# Patient Record
Sex: Female | Born: 1935
Health system: Southern US, Community
[De-identification: ages and names within clinical notes are randomized; demographics above are authoritative.]

## PROBLEM LIST (undated history)

## (undated) DIAGNOSIS — R569 Unspecified convulsions: Secondary | ICD-10-CM

## (undated) DIAGNOSIS — E785 Hyperlipidemia, unspecified: Secondary | ICD-10-CM

## (undated) DIAGNOSIS — I5189 Other ill-defined heart diseases: Secondary | ICD-10-CM

## (undated) DIAGNOSIS — T7840XA Allergy, unspecified, initial encounter: Secondary | ICD-10-CM

## (undated) DIAGNOSIS — I10 Essential (primary) hypertension: Secondary | ICD-10-CM

## (undated) DIAGNOSIS — F419 Anxiety disorder, unspecified: Secondary | ICD-10-CM

## (undated) DIAGNOSIS — F32A Depression, unspecified: Secondary | ICD-10-CM

## (undated) DIAGNOSIS — F329 Major depressive disorder, single episode, unspecified: Secondary | ICD-10-CM

## (undated) HISTORY — DX: Essential (primary) hypertension: I10

## (undated) HISTORY — DX: Unspecified convulsions: R56.9

## (undated) HISTORY — DX: Hyperlipidemia, unspecified: E78.5

## (undated) HISTORY — DX: Allergy, unspecified, initial encounter: T78.40XA

## (undated) HISTORY — DX: Major depressive disorder, single episode, unspecified: F32.9

## (undated) HISTORY — DX: Other ill-defined heart diseases: I51.89

## (undated) HISTORY — DX: Anxiety disorder, unspecified: F41.9

## (undated) HISTORY — DX: Depression, unspecified: F32.A

---

## 1985-09-14 HISTORY — PX: DILATION AND CURETTAGE OF UTERUS: SHX78

## 1999-01-28 ENCOUNTER — Other Ambulatory Visit: Admission: RE | Admit: 1999-01-28 | Discharge: 1999-01-28 | Payer: Self-pay | Admitting: Internal Medicine

## 2003-06-29 ENCOUNTER — Emergency Department (HOSPITAL_COMMUNITY): Admission: EM | Admit: 2003-06-29 | Discharge: 2003-06-29 | Payer: Self-pay | Admitting: Emergency Medicine

## 2004-05-27 ENCOUNTER — Other Ambulatory Visit: Admission: RE | Admit: 2004-05-27 | Discharge: 2004-05-27 | Payer: Self-pay | Admitting: Obstetrics and Gynecology

## 2004-05-30 ENCOUNTER — Encounter: Admission: RE | Admit: 2004-05-30 | Discharge: 2004-05-30 | Payer: Self-pay | Admitting: Internal Medicine

## 2004-08-21 ENCOUNTER — Ambulatory Visit: Payer: Self-pay | Admitting: Internal Medicine

## 2005-05-28 ENCOUNTER — Ambulatory Visit: Payer: Self-pay | Admitting: Internal Medicine

## 2005-08-12 ENCOUNTER — Ambulatory Visit: Payer: Self-pay | Admitting: Internal Medicine

## 2005-11-25 ENCOUNTER — Ambulatory Visit: Payer: Self-pay | Admitting: Internal Medicine

## 2005-12-24 ENCOUNTER — Ambulatory Visit: Payer: Self-pay

## 2006-05-07 ENCOUNTER — Ambulatory Visit: Payer: Self-pay | Admitting: Internal Medicine

## 2006-05-21 ENCOUNTER — Encounter (INDEPENDENT_AMBULATORY_CARE_PROVIDER_SITE_OTHER): Payer: Self-pay | Admitting: *Deleted

## 2006-05-21 ENCOUNTER — Ambulatory Visit: Payer: Self-pay | Admitting: Cardiology

## 2006-06-11 ENCOUNTER — Ambulatory Visit: Payer: Self-pay | Admitting: Internal Medicine

## 2006-07-16 ENCOUNTER — Ambulatory Visit: Payer: Self-pay | Admitting: Internal Medicine

## 2006-09-17 ENCOUNTER — Ambulatory Visit: Payer: Self-pay | Admitting: Internal Medicine

## 2006-12-08 ENCOUNTER — Ambulatory Visit: Payer: Self-pay | Admitting: Internal Medicine

## 2006-12-08 LAB — CONVERTED CEMR LAB
ALT: 28 units/L (ref 0–40)
AST: 36 units/L (ref 0–37)
Albumin: 4 g/dL (ref 3.5–5.2)
Alkaline Phosphatase: 43 units/L (ref 39–117)
BUN: 12 mg/dL (ref 6–23)
Bilirubin, Direct: 0.1 mg/dL (ref 0.0–0.3)
CO2: 30 meq/L (ref 19–32)
Calcium: 9.5 mg/dL (ref 8.4–10.5)
Chloride: 102 meq/L (ref 96–112)
Cholesterol: 204 mg/dL (ref 0–200)
Creatinine, Ser: 0.8 mg/dL (ref 0.4–1.2)
Direct LDL: 128.9 mg/dL
GFR calc Af Amer: 90 mL/min
GFR calc non Af Amer: 75 mL/min
Glucose, Bld: 134 mg/dL — ABNORMAL HIGH (ref 70–99)
HDL: 58.4 mg/dL (ref >39.0)
Hgb A1c MFr Bld: 6.4 % — ABNORMAL HIGH (ref 4.6–6.0)
Potassium: 4.4 meq/L (ref 3.5–5.1)
Sed Rate: 10 mm/hr (ref 0–25)
Sodium: 140 meq/L (ref 135–145)
TSH: 2.87 microintl units/mL (ref 0.35–5.50)
Total Bilirubin: 0.9 mg/dL (ref 0.3–1.2)
Total CHOL/HDL Ratio: 3.5
Total Protein: 6.8 g/dL (ref 6.0–8.3)
Triglycerides: 89 mg/dL (ref 0–149)
VLDL: 18 mg/dL (ref 0–40)
Vit D, 1,25-Dihydroxy: 41 (ref 20–57)
Vitamin B-12: 601 pg/mL (ref 211–911)

## 2006-12-24 ENCOUNTER — Ambulatory Visit: Payer: Self-pay | Admitting: Internal Medicine

## 2007-03-15 ENCOUNTER — Ambulatory Visit: Payer: Self-pay | Admitting: Internal Medicine

## 2007-03-31 ENCOUNTER — Ambulatory Visit: Payer: Self-pay | Admitting: Internal Medicine

## 2007-03-31 LAB — CONVERTED CEMR LAB
BUN: 20 mg/dL (ref 6–23)
CO2: 30 meq/L (ref 19–32)
Calcium: 9.5 mg/dL (ref 8.4–10.5)
Chloride: 102 meq/L (ref 96–112)
Creatinine, Ser: 0.9 mg/dL (ref 0.4–1.2)
GFR calc Af Amer: 79 mL/min
GFR calc non Af Amer: 65 mL/min
Glucose, Bld: 100 mg/dL — ABNORMAL HIGH (ref 70–99)
Hgb A1c MFr Bld: 6.3 % — ABNORMAL HIGH (ref 4.6–6.0)
Potassium: 4.9 meq/L (ref 3.5–5.1)
Sodium: 138 meq/L (ref 135–145)

## 2007-04-01 ENCOUNTER — Ambulatory Visit: Payer: Self-pay | Admitting: Internal Medicine

## 2007-06-13 ENCOUNTER — Ambulatory Visit: Payer: Self-pay | Admitting: Internal Medicine

## 2007-06-29 ENCOUNTER — Encounter: Payer: Self-pay | Admitting: Internal Medicine

## 2007-06-29 DIAGNOSIS — F419 Anxiety disorder, unspecified: Secondary | ICD-10-CM | POA: Insufficient documentation

## 2007-06-29 DIAGNOSIS — J309 Allergic rhinitis, unspecified: Secondary | ICD-10-CM | POA: Insufficient documentation

## 2007-06-29 DIAGNOSIS — I129 Hypertensive chronic kidney disease with stage 1 through stage 4 chronic kidney disease, or unspecified chronic kidney disease: Secondary | ICD-10-CM | POA: Insufficient documentation

## 2007-06-29 DIAGNOSIS — M810 Age-related osteoporosis without current pathological fracture: Secondary | ICD-10-CM | POA: Insufficient documentation

## 2007-06-29 DIAGNOSIS — F411 Generalized anxiety disorder: Secondary | ICD-10-CM | POA: Insufficient documentation

## 2007-06-29 DIAGNOSIS — F32A Depression, unspecified: Secondary | ICD-10-CM | POA: Insufficient documentation

## 2007-06-29 DIAGNOSIS — F329 Major depressive disorder, single episode, unspecified: Secondary | ICD-10-CM | POA: Insufficient documentation

## 2007-08-01 ENCOUNTER — Ambulatory Visit: Payer: Self-pay | Admitting: Internal Medicine

## 2007-08-01 DIAGNOSIS — E119 Type 2 diabetes mellitus without complications: Secondary | ICD-10-CM | POA: Insufficient documentation

## 2007-08-01 LAB — CONVERTED CEMR LAB
BUN: 12 mg/dL (ref 6–23)
CO2: 28 meq/L (ref 19–32)
Calcium: 9.3 mg/dL (ref 8.4–10.5)
Chloride: 103 meq/L (ref 96–112)
Creatinine, Ser: 0.8 mg/dL (ref 0.4–1.2)
GFR calc Af Amer: 90 mL/min
GFR calc non Af Amer: 75 mL/min
Glucose, Bld: 114 mg/dL — ABNORMAL HIGH (ref 70–99)
Hgb A1c MFr Bld: 6.3 % — ABNORMAL HIGH (ref 4.6–6.0)
Potassium: 4.7 meq/L (ref 3.5–5.1)
Sodium: 139 meq/L (ref 135–145)

## 2007-09-05 ENCOUNTER — Ambulatory Visit: Payer: Self-pay | Admitting: Internal Medicine

## 2007-09-05 DIAGNOSIS — L570 Actinic keratosis: Secondary | ICD-10-CM | POA: Insufficient documentation

## 2007-10-03 ENCOUNTER — Telehealth: Payer: Self-pay | Admitting: Internal Medicine

## 2007-11-07 ENCOUNTER — Ambulatory Visit: Payer: Self-pay | Admitting: Internal Medicine

## 2007-11-07 LAB — CONVERTED CEMR LAB
ALT: 32 units/L (ref 0–35)
AST: 36 units/L (ref 0–37)
Albumin: 3.9 g/dL (ref 3.5–5.2)
Alkaline Phosphatase: 46 units/L (ref 39–117)
BUN: 14 mg/dL (ref 6–23)
Bilirubin, Direct: 0.2 mg/dL (ref 0.0–0.3)
CO2: 30 meq/L (ref 19–32)
Calcium: 9.4 mg/dL (ref 8.4–10.5)
Chloride: 103 meq/L (ref 96–112)
Cholesterol: 204 mg/dL (ref 0–200)
Creatinine, Ser: 0.9 mg/dL (ref 0.4–1.2)
Direct LDL: 115.1 mg/dL
GFR calc Af Amer: 79 mL/min
GFR calc non Af Amer: 65 mL/min
Glucose, Bld: 107 mg/dL — ABNORMAL HIGH (ref 70–99)
HDL: 59.3 mg/dL (ref >39.0)
Hgb A1c MFr Bld: 6.2 % — ABNORMAL HIGH (ref 4.6–6.0)
Potassium: 4.8 meq/L (ref 3.5–5.1)
Sodium: 139 meq/L (ref 135–145)
Total Bilirubin: 0.7 mg/dL (ref 0.3–1.2)
Total CHOL/HDL Ratio: 3.4
Total Protein: 7.1 g/dL (ref 6.0–8.3)
Triglycerides: 104 mg/dL (ref 0–149)
VLDL: 21 mg/dL (ref 0–40)

## 2007-11-23 ENCOUNTER — Ambulatory Visit: Payer: Self-pay | Admitting: Internal Medicine

## 2007-11-23 DIAGNOSIS — H01009 Unspecified blepharitis unspecified eye, unspecified eyelid: Secondary | ICD-10-CM | POA: Insufficient documentation

## 2007-11-25 ENCOUNTER — Encounter: Payer: Self-pay | Admitting: Internal Medicine

## 2008-01-24 ENCOUNTER — Encounter: Payer: Self-pay | Admitting: Endocrinology

## 2008-04-19 ENCOUNTER — Ambulatory Visit: Payer: Self-pay | Admitting: Internal Medicine

## 2008-04-19 LAB — CONVERTED CEMR LAB
ALT: 20 units/L (ref 0–35)
AST: 31 units/L (ref 0–37)
Albumin: 3.9 g/dL (ref 3.5–5.2)
Alkaline Phosphatase: 42 units/L (ref 39–117)
Bilirubin, Direct: 0.1 mg/dL (ref 0.0–0.3)
Cholesterol: 190 mg/dL (ref 0–200)
HDL: 54.2 mg/dL (ref >39.0)
Hgb A1c MFr Bld: 6.3 % — ABNORMAL HIGH (ref 4.6–6.0)
LDL Cholesterol: 123 mg/dL — ABNORMAL HIGH (ref 0–99)
TSH: 2.38 microintl units/mL (ref 0.35–5.50)
Total Bilirubin: 0.8 mg/dL (ref 0.3–1.2)
Total CHOL/HDL Ratio: 3.5
Total Protein: 6.7 g/dL (ref 6.0–8.3)
Triglycerides: 64 mg/dL (ref 0–149)
VLDL: 13 mg/dL (ref 0–40)

## 2008-04-20 ENCOUNTER — Ambulatory Visit: Payer: Self-pay | Admitting: Internal Medicine

## 2008-04-20 ENCOUNTER — Encounter (INDEPENDENT_AMBULATORY_CARE_PROVIDER_SITE_OTHER): Payer: Self-pay | Admitting: *Deleted

## 2008-04-20 DIAGNOSIS — K5900 Constipation, unspecified: Secondary | ICD-10-CM | POA: Insufficient documentation

## 2008-05-15 ENCOUNTER — Telehealth: Payer: Self-pay | Admitting: Internal Medicine

## 2008-05-28 ENCOUNTER — Ambulatory Visit: Payer: Self-pay | Admitting: Internal Medicine

## 2008-06-21 ENCOUNTER — Ambulatory Visit: Payer: Self-pay | Admitting: Internal Medicine

## 2008-06-22 ENCOUNTER — Telehealth: Payer: Self-pay | Admitting: Internal Medicine

## 2008-06-28 ENCOUNTER — Ambulatory Visit: Payer: Self-pay | Admitting: Internal Medicine

## 2008-06-28 ENCOUNTER — Telehealth: Payer: Self-pay | Admitting: Internal Medicine

## 2008-06-28 DIAGNOSIS — E785 Hyperlipidemia, unspecified: Secondary | ICD-10-CM | POA: Insufficient documentation

## 2008-07-24 ENCOUNTER — Encounter: Payer: Self-pay | Admitting: Internal Medicine

## 2008-08-22 ENCOUNTER — Ambulatory Visit: Payer: Self-pay | Admitting: Internal Medicine

## 2008-08-22 LAB — CONVERTED CEMR LAB
ALT: 23 units/L (ref 0–35)
AST: 31 units/L (ref 0–37)
Albumin: 3.9 g/dL (ref 3.5–5.2)
Alkaline Phosphatase: 54 units/L (ref 39–117)
BUN: 15 mg/dL (ref 6–23)
Bilirubin, Direct: 0.1 mg/dL (ref 0.0–0.3)
CO2: 30 meq/L (ref 19–32)
Calcium: 9.3 mg/dL (ref 8.4–10.5)
Chloride: 104 meq/L (ref 96–112)
Creatinine, Ser: 0.8 mg/dL (ref 0.4–1.2)
GFR calc Af Amer: 90 mL/min
GFR calc non Af Amer: 74 mL/min
Glucose, Bld: 102 mg/dL — ABNORMAL HIGH (ref 70–99)
Hgb A1c MFr Bld: 6.2 % — ABNORMAL HIGH (ref 4.6–6.0)
Potassium: 4.7 meq/L (ref 3.5–5.1)
Sodium: 139 meq/L (ref 135–145)
Total Bilirubin: 0.7 mg/dL (ref 0.3–1.2)
Total Protein: 7 g/dL (ref 6.0–8.3)

## 2008-08-23 ENCOUNTER — Ambulatory Visit: Payer: Self-pay | Admitting: Internal Medicine

## 2008-09-28 ENCOUNTER — Ambulatory Visit: Payer: Self-pay | Admitting: Internal Medicine

## 2008-10-29 ENCOUNTER — Ambulatory Visit: Payer: Self-pay | Admitting: Internal Medicine

## 2008-10-30 LAB — CONVERTED CEMR LAB
BUN: 22 mg/dL (ref 6–23)
CO2: 30 meq/L (ref 19–32)
Calcium: 9.7 mg/dL (ref 8.4–10.5)
Chloride: 104 meq/L (ref 96–112)
Creatinine, Ser: 1 mg/dL (ref 0.4–1.2)
GFR calc Af Amer: 70 mL/min
GFR calc non Af Amer: 57 mL/min
Glucose, Bld: 112 mg/dL — ABNORMAL HIGH (ref 70–99)
Potassium: 4.8 meq/L (ref 3.5–5.1)
Sodium: 140 meq/L (ref 135–145)

## 2008-10-31 ENCOUNTER — Ambulatory Visit: Payer: Self-pay | Admitting: Internal Medicine

## 2008-10-31 DIAGNOSIS — L259 Unspecified contact dermatitis, unspecified cause: Secondary | ICD-10-CM | POA: Insufficient documentation

## 2008-12-24 ENCOUNTER — Encounter: Payer: Self-pay | Admitting: Internal Medicine

## 2009-01-01 ENCOUNTER — Telehealth: Payer: Self-pay | Admitting: Internal Medicine

## 2009-03-12 ENCOUNTER — Ambulatory Visit: Payer: Self-pay | Admitting: Internal Medicine

## 2009-03-12 LAB — CONVERTED CEMR LAB
BUN: 27 mg/dL — ABNORMAL HIGH (ref 6–23)
CO2: 30 meq/L (ref 19–32)
Calcium: 9.2 mg/dL (ref 8.4–10.5)
Chloride: 98 meq/L (ref 96–112)
Cholesterol: 191 mg/dL (ref 0–200)
Creatinine, Ser: 1 mg/dL (ref 0.4–1.2)
GFR calc non Af Amer: 57.28 mL/min (ref >60)
Glucose, Bld: 99 mg/dL (ref 70–99)
HDL: 59.4 mg/dL (ref >39.00)
Hgb A1c MFr Bld: 6.4 % (ref 4.6–6.5)
LDL Cholesterol: 120 mg/dL — ABNORMAL HIGH (ref 0–99)
Potassium: 4.2 meq/L (ref 3.5–5.1)
Sodium: 137 meq/L (ref 135–145)
Total CHOL/HDL Ratio: 3
Triglycerides: 60 mg/dL (ref 0.0–149.0)
VLDL: 12 mg/dL (ref 0.0–40.0)

## 2009-03-14 ENCOUNTER — Ambulatory Visit: Payer: Self-pay | Admitting: Internal Medicine

## 2009-07-10 ENCOUNTER — Ambulatory Visit: Payer: Self-pay | Admitting: Internal Medicine

## 2009-07-10 LAB — CONVERTED CEMR LAB
BUN: 22 mg/dL (ref 6–23)
CO2: 32 meq/L (ref 19–32)
Calcium: 9.4 mg/dL (ref 8.4–10.5)
Chloride: 100 meq/L (ref 96–112)
Creatinine, Ser: 1 mg/dL (ref 0.4–1.2)
GFR calc non Af Amer: 57.23 mL/min (ref >60)
Glucose, Bld: 98 mg/dL (ref 70–99)
Hgb A1c MFr Bld: 6.2 % (ref 4.6–6.5)
Potassium: 4.4 meq/L (ref 3.5–5.1)
Sodium: 140 meq/L (ref 135–145)

## 2009-07-12 ENCOUNTER — Ambulatory Visit: Payer: Self-pay | Admitting: Internal Medicine

## 2009-07-12 DIAGNOSIS — R142 Eructation: Secondary | ICD-10-CM

## 2009-07-12 DIAGNOSIS — R141 Gas pain: Secondary | ICD-10-CM | POA: Insufficient documentation

## 2009-07-12 DIAGNOSIS — R143 Flatulence: Secondary | ICD-10-CM

## 2009-07-12 DIAGNOSIS — Z87891 Personal history of nicotine dependence: Secondary | ICD-10-CM | POA: Insufficient documentation

## 2009-11-25 ENCOUNTER — Encounter: Payer: Self-pay | Admitting: Internal Medicine

## 2010-01-27 ENCOUNTER — Ambulatory Visit: Payer: Self-pay | Admitting: Internal Medicine

## 2010-01-27 LAB — CONVERTED CEMR LAB
ALT: 17 units/L (ref 0–35)
AST: 28 units/L (ref 0–37)
Albumin: 4.1 g/dL (ref 3.5–5.2)
Alkaline Phosphatase: 46 units/L (ref 39–117)
BUN: 17 mg/dL (ref 6–23)
Bilirubin Urine: NEGATIVE
Bilirubin, Direct: 0.1 mg/dL (ref 0.0–0.3)
CO2: 30 meq/L (ref 19–32)
Calcium: 9.6 mg/dL (ref 8.4–10.5)
Chloride: 93 meq/L — ABNORMAL LOW (ref 96–112)
Cholesterol: 206 mg/dL — ABNORMAL HIGH (ref 0–200)
Creatinine, Ser: 0.9 mg/dL (ref 0.4–1.2)
Direct LDL: 129 mg/dL
GFR calc non Af Amer: 65.37 mL/min (ref >60)
Glucose, Bld: 90 mg/dL (ref 70–99)
HDL: 63.8 mg/dL (ref >39.00)
Hemoglobin, Urine: NEGATIVE
Ketones, ur: NEGATIVE mg/dL
Nitrite: NEGATIVE
Potassium: 4.6 meq/L (ref 3.5–5.1)
Sodium: 133 meq/L — ABNORMAL LOW (ref 135–145)
Specific Gravity, Urine: 1.01 (ref 1.000–1.030)
TSH: 2.57 microintl units/mL (ref 0.35–5.50)
Total Bilirubin: 0.5 mg/dL (ref 0.3–1.2)
Total CHOL/HDL Ratio: 3
Total Protein, Urine: NEGATIVE mg/dL
Total Protein: 6.6 g/dL (ref 6.0–8.3)
Triglycerides: 51 mg/dL (ref 0.0–149.0)
Urine Glucose: NEGATIVE mg/dL
Urobilinogen, UA: 0.2 (ref 0.0–1.0)
VLDL: 10.2 mg/dL (ref 0.0–40.0)
pH: 5.5 (ref 5.0–8.0)

## 2010-01-28 ENCOUNTER — Ambulatory Visit: Payer: Self-pay | Admitting: Internal Medicine

## 2010-01-28 DIAGNOSIS — R05 Cough: Secondary | ICD-10-CM | POA: Insufficient documentation

## 2010-01-28 DIAGNOSIS — N329 Bladder disorder, unspecified: Secondary | ICD-10-CM | POA: Insufficient documentation

## 2010-03-06 ENCOUNTER — Telehealth: Payer: Self-pay | Admitting: Internal Medicine

## 2010-03-31 ENCOUNTER — Telehealth: Payer: Self-pay | Admitting: Internal Medicine

## 2010-04-09 ENCOUNTER — Telehealth: Payer: Self-pay | Admitting: Internal Medicine

## 2010-04-29 ENCOUNTER — Ambulatory Visit: Payer: Self-pay | Admitting: Internal Medicine

## 2010-04-29 LAB — CONVERTED CEMR LAB
ALT: 17 units/L (ref 0–35)
AST: 27 units/L (ref 0–37)
Albumin: 4.1 g/dL (ref 3.5–5.2)
Alkaline Phosphatase: 49 units/L (ref 39–117)
BUN: 26 mg/dL — ABNORMAL HIGH (ref 6–23)
Basophils Absolute: 0.1 10*3/uL (ref 0.0–0.1)
Basophils Relative: 1.6 % (ref 0.0–3.0)
Bilirubin Urine: NEGATIVE
Bilirubin, Direct: 0.1 mg/dL (ref 0.0–0.3)
CO2: 34 meq/L — ABNORMAL HIGH (ref 19–32)
Calcium: 9.2 mg/dL (ref 8.4–10.5)
Chloride: 98 meq/L (ref 96–112)
Cholesterol: 198 mg/dL (ref 0–200)
Creatinine, Ser: 1.1 mg/dL (ref 0.4–1.2)
Eosinophils Absolute: 0.2 10*3/uL (ref 0.0–0.7)
Eosinophils Relative: 3.3 % (ref 0.0–5.0)
GFR calc non Af Amer: 49.09 mL/min (ref >60)
Glucose, Bld: 91 mg/dL (ref 70–99)
HCT: 37.8 % (ref 36.0–46.0)
HDL: 55.9 mg/dL (ref >39.00)
Hemoglobin, Urine: NEGATIVE
Hemoglobin: 12.8 g/dL (ref 12.0–15.0)
Ketones, ur: NEGATIVE mg/dL
LDL Cholesterol: 126 mg/dL — ABNORMAL HIGH (ref 0–99)
Lymphocytes Relative: 41.9 % (ref 12.0–46.0)
Lymphs Abs: 2.4 10*3/uL (ref 0.7–4.0)
MCHC: 34 g/dL (ref 30.0–36.0)
MCV: 84.3 fL (ref 78.0–100.0)
Monocytes Absolute: 0.8 10*3/uL (ref 0.1–1.0)
Monocytes Relative: 13.4 % — ABNORMAL HIGH (ref 3.0–12.0)
Neutro Abs: 2.2 10*3/uL (ref 1.4–7.7)
Neutrophils Relative %: 39.8 % — ABNORMAL LOW (ref 43.0–77.0)
Nitrite: NEGATIVE
Platelets: 322 10*3/uL (ref 150.0–400.0)
Potassium: 5.1 meq/L (ref 3.5–5.1)
RBC: 4.48 M/uL (ref 3.87–5.11)
RDW: 14.8 % — ABNORMAL HIGH (ref 11.5–14.6)
Sodium: 138 meq/L (ref 135–145)
Specific Gravity, Urine: 1.01 (ref 1.000–1.030)
TSH: 3.45 microintl units/mL (ref 0.35–5.50)
Total Bilirubin: 0.5 mg/dL (ref 0.3–1.2)
Total CHOL/HDL Ratio: 4
Total Protein, Urine: NEGATIVE mg/dL
Total Protein: 6.9 g/dL (ref 6.0–8.3)
Triglycerides: 83 mg/dL (ref 0.0–149.0)
Urine Glucose: NEGATIVE mg/dL
Urobilinogen, UA: 0.2 (ref 0.0–1.0)
VLDL: 16.6 mg/dL (ref 0.0–40.0)
WBC: 5.6 10*3/uL (ref 4.5–10.5)
pH: 8 (ref 5.0–8.0)

## 2010-04-30 ENCOUNTER — Ambulatory Visit: Payer: Self-pay | Admitting: Internal Medicine

## 2010-04-30 ENCOUNTER — Encounter: Payer: Self-pay | Admitting: Internal Medicine

## 2010-05-01 ENCOUNTER — Encounter (INDEPENDENT_AMBULATORY_CARE_PROVIDER_SITE_OTHER): Payer: Self-pay | Admitting: *Deleted

## 2010-06-17 ENCOUNTER — Ambulatory Visit: Payer: Self-pay | Admitting: Internal Medicine

## 2010-06-17 DIAGNOSIS — R198 Other specified symptoms and signs involving the digestive system and abdomen: Secondary | ICD-10-CM | POA: Insufficient documentation

## 2010-07-16 ENCOUNTER — Telehealth: Payer: Self-pay | Admitting: Internal Medicine

## 2010-07-22 ENCOUNTER — Ambulatory Visit: Payer: Self-pay | Admitting: Internal Medicine

## 2010-07-22 LAB — HM COLONOSCOPY

## 2010-07-30 ENCOUNTER — Ambulatory Visit: Payer: Self-pay | Admitting: Internal Medicine

## 2010-10-14 NOTE — Letter (Signed)
Summary: New Patient letter  [facility]  [address] [place], [place] [address]   Phone: [phone]  Fax: [phone]       05/01/2010 MRN: [id number]  [facility] [address] [place], [place]  [address]  Dear Ms. Berges,  Welcome to the Gastroenterology Division at [facility].    You are scheduled to see Dr. Yancey Flemings on June 17, 2010 at 10:00am on the 3rd floor at [facility], [address]  We ask that you try to arrive at our office 15 minutes prior to your appointment time to allow for check-in.  We would like you to complete the enclosed self-administered evaluation form prior to your visit and bring it with you on the day of your appointment.  We will review it with you.  Also, please bring a complete list of all your medications or, if you prefer, bring the medication bottles and we will list them.  Please bring your insurance card so that we may make a copy of it.  If your insurance requires a referral to see a specialist, please bring your referral form from your primary care physician.  Co-payments are due at the time of your visit and may be paid by cash, check or credit card.     Your office visit will consist of a consult with your physician (includes a physical exam), any laboratory testing he/she may order, scheduling of any necessary diagnostic testing (e.g. x-ray, ultrasound, CT-scan), and scheduling of a procedure (e.g. Endoscopy, Colonoscopy) if required.  Please allow enough time on your schedule to allow for any/all of these possibilities.    If you cannot keep your appointment, please call [phone] to cancel or reschedule prior to your appointment date.  This allows [place] the opportunity to schedule an appointment for another patient in need of care.  If you do not cancel or reschedule by 5 p.m. the business day prior to your appointment date, you will be charged a $50.00 late cancellation/no-show fee.    Thank you for  choosing [facility] for your medical needs.  We appreciate the opportunity to care for you.  Please visit [place] at our website  to learn more about our practice.                     Sincerely,                                                             The Gastroenterology Division

## 2010-10-14 NOTE — Assessment & Plan Note (Signed)
Summary: Consult-constipation/bloating, colonoscopy   History of Present Illness Visit Type: Initial Consult Primary GI MD: Yancey Flemings MD Primary Provider: Jacinta Shoe, MD Requesting Provider: Jacinta Shoe, MD Chief Complaint: constipation & bloating History of Present Illness:   77 year old white female with a history of hypertension, anxiety/depression, allergic rhinitis, osteoporosis, hyperlipidemia, and bladder prolapse. She presents today, upon referral, regarding constipation as well as the need for colonoscopy. The patient reports some difficulties with constipation over 2 years ago. Review of the medical record finds CT scan of the abdomen and pelvis September 2007 for severe constipation. The exam was said to show mild constipation with nonspecific pelvic free fluid and probable uterine fibroids. No other abnormalities. She now reports several month history of significant problems with constipation, and change in bowel habits. Stool is ball-like. She has tried lactulose and milk of magnesia without improvement. She denies abdominal pain, bleeding, weight loss, prior GI evaluation, or family history of colon cancer. Review of outside laboratories from August 16 finds normal calcium of 9.2 as well as normal hemoglobin of 12.8 and normal thyroid stimulating hormone of 3.45.   GI Review of Systems    Reports bloating.      Denies abdominal pain, acid reflux, belching, chest pain, dysphagia with liquids, dysphagia with solids, heartburn, loss of appetite, nausea, vomiting, vomiting blood, weight loss, and  weight gain.      Reports change in bowel habits and  constipation.     Denies anal fissure, black tarry stools, diarrhea, diverticulosis, fecal incontinence, heme positive stool, hemorrhoids, irritable bowel syndrome, jaundice, light color stool, liver problems, rectal bleeding, and  rectal pain. Preventive Screening-Counseling & Management      Drug Use:  no.      Current  Medications (verified): 1)  Triamterene-Hctz 75-50 Mg Tabs (Triamterene-Hctz) .... Take 1/2  Tablet By Mouth  Every Morning 2)  Atenolol 100 Mg Tabs (Atenolol) .Marland Kitchen.. 1 By Mouth At Bedtime 3)  Crestor 40 Mg Tabs (Rosuvastatin Calcium) .... Once Daily 4)  Paxil 20 Mg Tabs (Paroxetine Hcl) .... Take 1 Tab By Mouth Qd 5)  Baby Aspirin 81 Mg  Chew (Aspirin) .... Once Daily 6)  Centrum Silver  Chew (Multiple Vitamins-Minerals) .... Once Daily 7)  Amlodipine Besylate 5 Mg  Tabs (Amlodipine Besylate) .Marland Kitchen.. 1 By Mouth Every Day 8)  Cozaar 100 Mg Tabs (Losartan Potassium) .Marland Kitchen.. 1 By Mouth Qd  Allergies (verified): 1)  Capoten  Past History:  Past Medical History: Reviewed history from 04/30/2010 and no changes required. Anxiety Depression Hypertension Allergic rhinitis Osteoporosis  733.00 Constipation Diabetes mellitus, type II Hyperlipidemia GYN Dr Henderson Cloud - pessory  Past Surgical History: Reviewed history from 08/01/2007 and no changes required. D and C '87  Family History: Reviewed history from 08/01/2007 and no changes required. Family History High cholesterol Family History Hypertension Family History of Stroke F 1st degree relative <60  Social History: Retired Former Smoker Alcohol use-no Regular exercise-yes Daily Caffeine Use Illicit Drug Use - no Drug Use:  no  Review of Systems       The patient complains of anxiety-new, arthritis/joint pain, back pain, and muscle pains/cramps.  The patient denies allergy/sinus, anemia, blood in urine, breast changes/lumps, change in vision, confusion, cough, coughing up blood, depression-new, fainting, fatigue, fever, headaches-new, hearing problems, heart murmur, heart rhythm changes, itching, menstrual pain, night sweats, nosebleeds, pregnancy symptoms, shortness of breath, skin rash, sleeping problems, sore throat, swelling of feet/legs, swollen lymph glands, thirst - excessive , urination - excessive , urination  changes/pain, urine  leakage, vision changes, and voice change.    Vital Signs:  Patient profile:   77 year old female Height:      61 inches Weight:      125.50 pounds BMI:     23.80 Pulse rate:   72 / minute Pulse rhythm:   regular  Vitals Entered By: June McMurray CMA Duncan Dull) (June 17, 2010 10:36 AM)  Physical Exam  General:  patient refused blood pressure measurement stating that it upsets her...  Well-developed, well-nourished. No acute distress Head:  Normocephalic and atraumatic. Eyes:  PERRLA, no icterus. Nose:  No deformity, discharge,  or lesions. Mouth:  no thrush Neck:  Supple; no masses or thyromegaly. Lungs:  Clear throughout to auscultation. Heart:  Regular rate and rhythm; no murmurs, rubs,  or bruits. Abdomen:  Soft, nontender and nondistended. No masses, hepatosplenomegaly or hernias noted. Normal bowel sounds. Rectal:  deferred until colonoscopy Msk:  Symmetrical with no gross deformities. Normal posture. Pulses:  Normal pulses noted. Extremities:  No clubbing, cyanosis, edema or deformities noted. Neurologic:  Alert and  oriented x4;  grossly normal neurologically. Skin:  Intact without significant lesions or rashes. Cervical Nodes:  No significant cervical adenopathy.. No supraclavicular adenopathy Psych:  Alert and cooperative. Normal mood and affect.   Impression & Recommendations:  Problem # 1:  CHANGE IN BOWELS (ICD-787.99) Change in bowel habits as manifested by a change in stool appearance as well as constipation (564.0). Most likely cause for constipation is functional due to change in motility and/or medication effect. No evidence of secondary causes.  Plan: #1. Prescribed GlycoLax 17 g in 8 ounces of water daily. Titrated need or desired effect #2. Fiber supplementation with fiber tablets daily #3. Brochure and constipation provided after discussion of constipation with patient by Dr. Marina Goodell  Problem # 2:  SPECIAL SCREENING FOR MALIGNANT NEOPLASMS COLON  (ICD-V76.51) the patient is an appropriate candidate for colonoscopy without contraindication. She has not had prior screening. These she was brought to the forefront given change in bowel habits. Rule out structural lesion.  Plan: #1. Colonoscopy. The nature of the procedure as well as the risks, benefits, and alternatives were carefully reviewed in detail. She understood and agreed to proceed. #2. Literature on colonoscopy and colon cancer screening/polyps also provided #3. Movi prep prescribed. The patient instructed on its use.  Problem # 3:  CONSTIPATION (ICD-564.00) see above discussion  Other Orders: Colonoscopy (Colon)  Patient Instructions: 1)  Colonoscopy [facility] 07/22/10 3:00 pm arrive at 2:00 pm 2)  Movi prep instructions given to patient. 3)  Movi prep Rx. sent to pharmacy. 4)  Use Glycolax (Miralax) OTC 17 grams in 8 oz of water daily titrate as needed 5)  Fiber tablets 2 once daily 6)  Colonoscopy and Flexible Sigmoidoscopy brochure given.  7)  Copy sent to : Jacinta Shoe, MD 8)  Constipation  brochure given.  9)  Colon Polyps and Cancer brochure given. 10)  The medication list was reviewed and reconciled.  All changed / newly prescribed medications were explained.  A complete medication list was provided to the patient / caregiver. Prescriptions: MOVIPREP 100 GM  SOLR (PEG-KCL-NACL-NASULF-NA ASC-C) As per prep instructions.  #1 x 0   Entered by:   Milford Cage NCMA   Authorized by:   Hilarie Fredrickson MD   Signed by:   Milford Cage NCMA on 06/17/2010   Method used:   Electronically to        [facility]* (  retail)       [address]       [place], [place]  [id number]       Ph: [phone]       Fax: [phone]   RxID:   [id number]

## 2010-10-14 NOTE — Assessment & Plan Note (Signed)
Summary: 2 wk f/u,.rs'd from 4/26/#/cd   Vital Signs:  Patient profile:   77 year old female Height:      61 inches Weight:      125 pounds BMI:     23.70 O2 Sat:      97 % on Room air Temp:     98.4 degrees F oral Pulse rate:   58 / minute BP sitting:   140 / 88  (left arm) Cuff size:   regular  Vitals Entered By: Lucious Groves (Jan 28, 2010 11:37 AM)  O2 Flow:  Room air CC: 2 wk f/u./kb Is Patient Diabetic? No Pain Assessment Patient in pain? no        CC:  2 wk f/u./kb.  History of Present Illness: The patient presents for a follow up of hypertension, stress, hyperlipidemia, depression... C/o stress C/o cough  Current Medications (verified): 1)  Maxzide-25 37.5-25 Mg Tabs (Triamterene-Hctz) .Marland Kitchen.. 1 By Mouth Once Daily in Am 2)  Captopril 50 Mg Tabs (Captopril) .... 2 By Mouth Bid 3)  Atenolol 100 Mg Tabs (Atenolol) .Marland Kitchen.. 1 By Mouth At Bedtime 4)  Crestor 40 Mg Tabs (Rosuvastatin Calcium) .... Once Daily 5)  Paxil 20 Mg Tabs (Paroxetine Hcl) .... Take 1 Tab By Mouth Qd 6)  Baby Aspirin 81 Mg  Chew (Aspirin) .... Once Daily 7)  Centrum Silver  Chew (Multiple Vitamins-Minerals) .... Once Daily 8)  Amlodipine Besylate 5 Mg  Tabs (Amlodipine Besylate) .Marland Kitchen.. 1 By Mouth Every Day 9)  Vitamin D3 1000 Unit  Tabs (Cholecalciferol) .Marland Kitchen.. 1 By Mouth Daily 10)  Triamcinolone Acetonide 0.5 % Crea (Triamcinolone Acetonide) .... Apply Bid To Affected Area  Allergies (verified): 1)  Capoten  Past History:  Past Medical History: Last updated: 06/28/2008 Anxiety Depression Hypertension Allergic rhinitis Osteoporosis  733.00 Constipation Diabetes mellitus, type II Hyperlipidemia  Family History: Last updated: 08/01/2007 Family History High cholesterol Family History Hypertension Family History of Stroke F 1st degree relative <60  Social History: Last updated: 08/01/2007 Retired Former Smoker Alcohol use-no Regular exercise-no  Physical Exam  General:   Well-developed,well-nourished,in no acute distress; alert,appropriate and cooperative throughout examination Nose:  External nasal examination shows no deformity or inflammation. Nasal mucosa are pink and moist without lesions or exudates. Mouth:  Oral mucosa and oropharynx without lesions or exudates.  Teeth in good repair. Lungs:  CTA Heart:  RRR Abdomen:  Bowel sounds positive,abdomen soft and non-tender without masses, organomegaly or hernias noted. Msk:  Nl. Very flexible Neurologic:  No cranial nerve deficits noted. Station and gait are normal. Plantar reflexes are down-going bilaterally. DTRs are symmetrical throughout. Sensory, motor and coordinative functions appear intact. Skin:  Intact without suspicious lesions or rashes Psych:  Oriented X3, normally interactive, good eye contact, and slightly anxious.  not suicidal.     Impression & Recommendations:  Problem # 1:  HYPERTENSION (ICD-401.9) Assessment Unchanged  The following medications were removed from the medication list:    Captopril 50 Mg Tabs (Captopril) .Marland Kitchen... 2 by mouth bid Her updated medication list for this problem includes:    Maxzide-25 37.5-25 Mg Tabs (Triamterene-hctz) .Marland Kitchen... 1 by mouth once daily in am    Atenolol 100 Mg Tabs (Atenolol) .Marland Kitchen... 1 by mouth at bedtime    Amlodipine Besylate 5 Mg Tabs (Amlodipine besylate) .Marland Kitchen... 1 by mouth every day    Cozaar 100 Mg Tabs (Losartan potassium) .Marland Kitchen... 1 by mouth qd  Problem # 2:  HYPERLIPIDEMIA (ICD-272.4) Assessment: Comment Only  Her updated medication list  for this problem includes:    Crestor 40 Mg Tabs (Rosuvastatin calcium) ..... Once daily  Problem # 3:  DIABETES MELLITUS, TYPE II (ICD-250.00) Assessment: Improved  The following medications were removed from the medication list:    Captopril 50 Mg Tabs (Captopril) .Marland Kitchen... 2 by mouth bid Her updated medication list for this problem includes:    Baby Aspirin 81 Mg Chew (Aspirin) ..... Once daily    Cozaar 100  Mg Tabs (Losartan potassium) .Marland Kitchen... 1 by mouth qd  Labs Reviewed: Creat: 0.9 (01/27/2010)    Reviewed HgBA1c results: 6.2 (07/10/2009)  6.4 (03/12/2009)  Problem # 4:  ANXIETY (ICD-300.00) Assessment: Deteriorated  The following medications were removed from the medication list:    Lorazepam 0.5 Mg Tabs (Lorazepam) .Marland Kitchen... 1 by mouth two times a day prn Her updated medication list for this problem includes:    Paxil 20 Mg Tabs (Paroxetine hcl) .Marland Kitchen... Take 1 tab by mouth qd  Problem # 5:  ALLERGIC RHINITIS (ICD-477.9) Assessment: Unchanged  Problem # 6:  DEPRESSION (ICD-311) Assessment: Deteriorated She willlet me know if needs more help The following medications were removed from the medication list:    Lorazepam 0.5 Mg Tabs (Lorazepam) .Marland Kitchen... 1 by mouth two times a day prn Her updated medication list for this problem includes:    Paxil 20 Mg Tabs (Paroxetine hcl) .Marland Kitchen... Take 1 tab by mouth qd  Problem # 7:  BLADDER PROLAPSE (ICD-596.9) Assessment: Unchanged Appt w/GYN is pending   Problem # 8:  COUGH (ICD-786.2) Assessment: New D/c Capoten Refused CXR  Complete Medication List: 1)  Maxzide-25 37.5-25 Mg Tabs (Triamterene-hctz) .Marland Kitchen.. 1 by mouth once daily in am 2)  Atenolol 100 Mg Tabs (Atenolol) .Marland Kitchen.. 1 by mouth at bedtime 3)  Crestor 40 Mg Tabs (Rosuvastatin calcium) .... Once daily 4)  Paxil 20 Mg Tabs (Paroxetine hcl) .... Take 1 tab by mouth qd 5)  Baby Aspirin 81 Mg Chew (Aspirin) .... Once daily 6)  Centrum Silver Chew (Multiple vitamins-minerals) .... Once daily 7)  Amlodipine Besylate 5 Mg Tabs (Amlodipine besylate) .Marland Kitchen.. 1 by mouth every day 8)  Vitamin D3 1000 Unit Tabs (Cholecalciferol) .Marland Kitchen.. 1 by mouth daily 9)  Triamcinolone Acetonide 0.5 % Crea (Triamcinolone acetonide) .... Apply bid to affected area 10)  Cozaar 100 Mg Tabs (Losartan potassium) .Marland Kitchen.. 1 by mouth qd  Patient Instructions: 1)  Please schedule a follow-up appointment in 3-4 months well  w/labs. Prescriptions: CRESTOR 40 MG TABS (ROSUVASTATIN CALCIUM) once daily  #30 x 12   Entered and Authorized by:   Tresa Garter MD   Signed by:   Tresa Garter MD on 01/28/2010   Method used:   Print then Give to Patient   RxID:   [id number] COZAAR 100 MG TABS (LOSARTAN POTASSIUM) 1 by mouth qd  #90 x 3   Entered and Authorized by:   Tresa Garter MD   Signed by:   Tresa Garter MD on 01/28/2010   Method used:   Print then Give to Patient   RxID:   [id number]

## 2010-10-14 NOTE — Assessment & Plan Note (Signed)
Summary: FLU SHOT/NWS  Nurse Visit   Allergies: 1)  Capoten  Orders Added: 1)  Flu Vaccine 37yrs + MEDICARE PATIENTS [Q2039] 2)  Administration Flu vaccine - MCR [G0008]      Flu Vaccine Consent Questions     Do you have a history of severe allergic reactions to this vaccine? no    Any prior history of allergic reactions to egg and/or gelatin? no    Do you have a sensitivity to the preservative Thimersol? no    Do you have a past history of Guillan-Barre Syndrome? no    Do you currently have an acute febrile illness? no    Have you ever had a severe reaction to latex? no    Vaccine information given and explained to patient? yes    Are you currently pregnant? no    Lot Number:[id number]   Exp Date:03/14/2011   Site Given  Left Deltoid IM1

## 2010-10-14 NOTE — Progress Notes (Signed)
Summary: Constipation  Phone Note Call from Patient   Summary of Call: Pt continues to c/o constipation. Last rx, lactulose has not given relief. Please advise.  Initial call taken by: Lamar Sprinkles, CMA,  March 31, 2010 10:34 AM  Follow-up for Phone Call        Try MOM 30-60 cc once daily prn Follow-up by: Tresa Garter MD,  March 31, 2010 1:00 PM  Additional Follow-up for Phone Call Additional follow up Details #1::        left mess to call office back............Marland KitchenLamar Sprinkles, CMA  March 31, 2010 3:17 PM   Pt informed  Additional Follow-up by: Lamar Sprinkles, CMA,  March 31, 2010 4:30 PM

## 2010-10-14 NOTE — Progress Notes (Signed)
  Phone Note Other Incoming   Caller: Pt Summary of Call: Pt states she has been constipated and wants to know what she should take? Initial call taken by: Ami Bullins CMA,  March 06, 2010 9:13 AM  Follow-up for Phone Call        Try Lactulose Rx Follow-up by: Tresa Garter MD,  March 06, 2010 12:44 PM    New/Updated Medications: LACTULOSE 20 GM/30ML SOLN (LACTULOSE) 30-60 cc once daily as needed constipation Prescriptions: LACTULOSE 20 GM/30ML SOLN (LACTULOSE) 30-60 cc once daily as needed constipation  #1 x 12   Entered and Authorized by:   Tresa Garter MD   Signed by:   Bill Salinas CMA on 03/06/2010   Method used:   Electronically to        [facility]* (retail)       [address]       [place], [place]  [id number]       Ph: [phone]       Fax: [phone]   RxID:   [id number]

## 2010-10-14 NOTE — Letter (Signed)
Summary: [facility] Instructions  [facility]  [address] [place], [place] [address]   Phone: [phone]  Fax: [phone]       Vickie Wilson    [birth year]    MRN: [id number]        Procedure Day /Date:TUESDAY, 07/22/10     Arrival Time:2:00 PM     Procedure Time:3:00 PM     Location of Procedure:                    X  [facility] (4th Floor)           PREPARATION FOR COLONOSCOPY WITH MOVIPREP   Starting 5 days prior to your procedure 07/17/10 do not eat nuts, seeds, popcorn, corn, beans, peas,  salads, or any raw vegetables.  Do not take any fiber supplements (e.g. Metamucil, Citrucel, and Benefiber).  THE DAY BEFORE YOUR PROCEDURE         DATE: 07/21/10  DAY: MONDAY  1.  Drink clear liquids the entire day-NO SOLID FOOD  2.  Do not drink anything colored red or purple.  Avoid juices with pulp.  No orange juice.  3.  Drink at least 64 oz. (8 glasses) of fluid/clear liquids during the day to prevent dehydration and help the prep work efficiently.  CLEAR LIQUIDS INCLUDE: Water Jello Ice Popsicles Tea (sugar ok, no milk/cream) Powdered fruit flavored drinks Coffee (sugar ok, no milk/cream) Gatorade Juice: apple, white grape, white cranberry  Lemonade Clear bullion, consomm, broth Carbonated beverages (any kind) Strained chicken noodle soup Hard Candy                             4.  In the morning, mix first dose of MoviPrep solution:    Empty 1 Pouch A and 1 Pouch B into the disposable container    Add lukewarm drinking water to the top line of the container. Mix to dissolve    Refrigerate (mixed solution should be used within 24 hrs)  5.  Begin drinking the prep at 5:00 p.m. The MoviPrep container is divided by 4 marks.   Every 15 minutes drink the solution down to the next mark (approximately 8 oz) until the full liter is complete.   6.  Follow completed prep with 16 oz of clear liquid of your choice (Nothing red or purple).   Continue to drink clear liquids until bedtime.  7.  Before going to bed, mix second dose of MoviPrep solution:    Empty 1 Pouch A and 1 Pouch B into the disposable container    Add lukewarm drinking water to the top line of the container. Mix to dissolve    Refrigerate  THE DAY OF YOUR PROCEDURE      DATE: 07/22/10 FTD:DUKGURK  Beginning at 10:00 a.m. (5 hours before procedure):         1. Every 15 minutes, drink the solution down to the next mark (approx 8 oz) until the full liter is complete.  2. Follow completed prep with 16 oz. of clear liquid of your choice.    3. You may drink clear liquids until 1:00 PM (2 HOURS BEFORE PROCEDURE).   MEDICATION INSTRUCTIONS  Unless otherwise instructed, you should take regular prescription medications with a small sip of water   as early as possible the morning of your procedure.        OTHER INSTRUCTIONS  You will need a responsible adult  at least 75 years of age to accompany you and drive you home.   This person must remain in the waiting room during your procedure.  Wear loose fitting clothing that is easily removed.  Leave jewelry and other valuables at home.  However, you may wish to bring a book to read or  an iPod/MP3 player to listen to music as you wait for your procedure to start.  Remove all body piercing jewelry and leave at home.  Total time from sign-in until discharge is approximately 2-3 hours.  You should go home directly after your procedure and rest.  You can resume normal activities the  day after your procedure.  The day of your procedure you should not:   Drive   Make legal decisions   Operate machinery   Drink alcohol   Return to work  You will receive specific instructions about eating, activities and medications before you leave.    The above instructions have been reviewed and explained to me by   _______________________    I fully understand and can verbalize these instructions  _____________________________ Date _________

## 2010-10-14 NOTE — Procedures (Signed)
Summary: Colonoscopy  Patient: Vickie Wilson Note: All result statuses are Final unless otherwise noted.  Tests: (1) Colonoscopy (COL)   COL Colonoscopy           DONE     [facility]     [address]     [place], [place]  [address]           COLONOSCOPY PROCEDURE REPORT           PATIENT:  Vickie Wilson, Vickie Wilson  MR#:  [id number]     BIRTHDATE:  03-24-1931, 79 yrs. old  GENDER:  female     ENDOSCOPIST:  Wilhemina Bonito. Eda Keys, MD     REF. BY:  Linda Hedges. Plotnikov, M.D.     PROCEDURE DATE:  07/22/2010     PROCEDURE:  Colonoscopy with snare polypectomy x 1     ASA CLASS:  Class II     INDICATIONS:  Routine Risk Screening, constipation     MEDICATIONS:   Fentanyl 100 mcg IV, Versed 9 mg IV           DESCRIPTION OF PROCEDURE:   After the risks benefits and     alternatives of the procedure were thoroughly explained, informed     consent was obtained.  Digital rectal exam was performed and     revealed no abnormalities.   The [id number] endoscope was     introduced through the anus and advanced to the cecum, which was     identified by both the appendix and ileocecal valve, without     limitations.Time to cecum = 6:44 min.  The quality of the prep was     good, using MoviPrep.  The instrument was then slowly withdrawn     (time = 12:56 min) as the colon was fully examined.     <<PROCEDUREIMAGES>>           FINDINGS:  A diminutive polyp was found in the descending colon.     Polyp was snared without cautery. Retrieval was successful.     Melanosis coli was present.  This was otherwise a normal examination     of the colon.   Retroflexed views in the rectum revealed no     abnormalities.    The scope was then withdrawn from the patient     and the procedure completed.           COMPLICATIONS:  None           ENDOSCOPIC IMPRESSION:     1) Diminutive polyp in the descending colon -removed     2) Otherwise normal examination     3) Constipation     RECOMMENDATIONS:     1) Return to  the care of your primary provider. GI follow up as     needed     2) Continue current medications for constipation           ______________________________     Wilhemina Bonito. Eda Keys, MD           CC:  Linda Hedges. Plotnikov, MD; The Patient           n.     eSIGNED:   Wilhemina Bonito. Eda Keys at 07/22/2010 03:46 PM           [name], [id number]  Note: An exclamation mark (!) indicates a result that was not dispersed into the flowsheet. Document Creation Date: 07/22/2010 3:46 PM _______________________________________________________________________  (1) Order result status: Final  Collection or observation date-time: 07/22/2010 15:39 Requested date-time:  Receipt date-time:  Reported date-time:  Referring Physician:   Ordering Physician: Fransico Setters [phone]) Specimen Source:  Source: Launa Grill Order Number: [id number] Lab site:

## 2010-10-14 NOTE — Assessment & Plan Note (Signed)
Summary: YEARLY---PER PT INSURANCE WILL PAY FOR B4 LABS---STC   Vital Signs:  Patient profile:   77 year old female Height:      61 inches Weight:      124 pounds BMI:     23.51 O2 Sat:      97 % on Room air Temp:     98.1 degrees F oral Pulse rate:   62 / minute Pulse rhythm:   regular Resp:     16 per minute BP sitting:   150 / 82  (left arm) Cuff size:   regular  Vitals Entered By: Lanier Prude, CMA(AAMA) (April 30, 2010 10:47 AM)  O2 Flow:  Room air CC: MWV Is Patient Diabetic? Yes   CC:  MWV.  History of Present Illness: Patient past medical history, social history, and family history reviewed in detail no significant changes.  Patient is physically active. Depression is negative and mood is good. Hearing is normal, and able to perform activities of daily living. Risk of falling is negligible and home safety has been reviewed and is appropriate. Patient has normal height, weight, and visual acuity. Patient has been counseled on age-appropriate routine health concerns for screening and prevention. Education, counseling done.  The patient presents for a follow up of hypertension, depression, hyperlipidemia. C/o constipation  Preventive Screening-Counseling & Management  Alcohol-Tobacco     Alcohol drinks/day: 0     Smoking Status: quit > 6 months  Caffeine-Diet-Exercise     Caffeine use/day: 4 cups     Caffeine Counseling: decrease use of caffeine     Diet Counseling: not indicated; diet is assessed to be healthy     Does Patient Exercise: yes     Depression Counseling: further diagnostic testing and/or other treatment is indicated  Hep-HIV-STD-Contraception     Hepatitis Risk: no risk noted     SBE monthly: yes     Sun Exposure-Excessive: no  Safety-Violence-Falls     Seat Belt Use: yes     Smoke Detectors: yes     Violence in the Home: risk noted     Sexual Abuse: no     Fall Risk Counseling: not indicated; no significant falls noted      Sexual History:   no.        Drug Use:  never.    Current Medications (verified): 1)  Maxzide-25 37.5-25 Mg Tabs (Triamterene-Hctz) .Marland Kitchen.. 1 By Mouth Once Daily in Am 2)  Atenolol 100 Mg Tabs (Atenolol) .Marland Kitchen.. 1 By Mouth At Bedtime 3)  Crestor 40 Mg Tabs (Rosuvastatin Calcium) .... Once Daily 4)  Paxil 20 Mg Tabs (Paroxetine Hcl) .... Take 1 Tab By Mouth Qd 5)  Baby Aspirin 81 Mg  Chew (Aspirin) .... Once Daily 6)  Centrum Silver  Chew (Multiple Vitamins-Minerals) .... Once Daily 7)  Amlodipine Besylate 5 Mg  Tabs (Amlodipine Besylate) .Marland Kitchen.. 1 By Mouth Every Day 8)  Vitamin D3 1000 Unit  Tabs (Cholecalciferol) .Marland Kitchen.. 1 By Mouth Daily 9)  Triamcinolone Acetonide 0.5 % Crea (Triamcinolone Acetonide) .... Apply Bid To Affected Area 10)  Cozaar 100 Mg Tabs (Losartan Potassium) .Marland Kitchen.. 1 By Mouth Qd 11)  Lactulose 20 Gm/80ml Soln (Lactulose) .... 30-60 Cc Once Daily As Needed Constipation  Allergies (verified): 1)  Capoten  Past History:  Past Surgical History: Last updated: 08/01/2007 D and C '87  Family History: Last updated: 08/01/2007 Family History High cholesterol Family History Hypertension Family History of Stroke F 1st degree relative <60  Past Medical History: Anxiety  Depression Hypertension Allergic rhinitis Osteoporosis  733.00 Constipation Diabetes mellitus, type II Hyperlipidemia GYN Dr Henderson Cloud - pessory  Social History: Retired Former Smoker Alcohol use-no Regular exercise-yes Smoking Status:  quit > 6 months Caffeine use/day:  4 cups Does Patient Exercise:  yes Hepatitis Risk:  no risk noted Sun Exposure-Excessive:  no Seat Belt Use:  yes Drug Use:  never Sexual History:  no  Review of Systems  The patient denies anorexia, fever, weight loss, weight gain, vision loss, decreased hearing, hoarseness, chest pain, syncope, dyspnea on exertion, peripheral edema, prolonged cough, headaches, hemoptysis, abdominal pain, melena, hematochezia, severe indigestion/heartburn, hematuria,  incontinence, genital sores, muscle weakness, suspicious skin lesions, transient blindness, difficulty walking, depression, unusual weight change, abnormal bleeding, enlarged lymph nodes, angioedema, and breast masses.         constipated  Physical Exam  General:  Well-developed,well-nourished,in no acute distress; alert,appropriate and cooperative throughout examination Head:  Normocephalic and atraumatic without obvious abnormalities. No apparent alopecia or balding. Eyes:  No corneal or conjunctival inflammation noted. EOMI. Perrla. Ears:  External ear exam shows no significant lesions or deformities.  Otoscopic examination reveals clear canals, tympanic membranes are intact bilaterally without bulging, retraction, inflammation or discharge. Hearing is grossly normal bilaterally. Nose:  External nasal examination shows no deformity or inflammation. Nasal mucosa are pink and moist without lesions or exudates. Mouth:  Oral mucosa and oropharynx without lesions or exudates.  Teeth in good repair. Neck:  No deformities, masses, or tenderness noted. Chest Wall:  No deformities, masses, or tenderness noted. Lungs:  Normal respiratory effort, chest expands symmetrically. Lungs are clear to auscultation, no crackles or wheezes. Heart:  Normal rate and regular rhythm. S1 and S2 normal without gallop, murmur, click, rub or other extra sounds. Abdomen:  Bowel sounds positive,abdomen soft and non-tender without masses, organomegaly or hernias noted. Msk:  No deformity or scoliosis noted of thoracic or lumbar spine.   Pulses:  R and L carotid,radial,femoral,dorsalis pedis and posterior tibial pulses are full and equal bilaterally Extremities:  No clubbing, cyanosis, edema, or deformity noted with normal full range of motion of all joints.   Neurologic:  No cranial nerve deficits noted. Station and gait are normal. Plantar reflexes are down-going bilaterally. DTRs are symmetrical throughout. Sensory, motor  and coordinative functions appear intact. Skin:  Intact without suspicious lesions or rashes aging changes present Psych:  Oriented X3, normally interactive, good eye contact, and slightly anxious.  not suicidal.     Impression & Recommendations:  Problem # 1:  ROUTINE GENERAL MEDICAL EXAM@HEALTH  CARE FACL (ICD-V70.0) Assessment New  Overall doing well, age appropriate education and counseling updated and referral for appropriate preventive services done unless declined, immunizations up to date or declined, diet counseling done if overweight, urged to quit smoking if smokes, most recent labs reviewed and current ordered if appropriate, ecg reviewed or declined (interpretation per ECG scanned in the EMR if done); information regarding Medicare Preventation requirements given if appropriate.  GYN q 1 year Mammo q 12 months  The labs were reviewed with the patient.  Orders: EKG w/ Interpretation ([address]) Gastroenterology Referral (GI)  Problem # 2:  HYPERLIPIDEMIA (ICD-272.4) Assessment: Improved  Her updated medication list for this problem includes:    Crestor 40 Mg Tabs (Rosuvastatin calcium) ..... Once daily  Problem # 3:  ABDOMINAL BLOATING (ICD-787.3) Assessment: Deteriorated  Orders: Gastroenterology Referral (GI)  Problem # 4:  ANXIETY (ICD-300.00) Assessment: Unchanged  Her updated medication list for this problem includes:    Paxil  20 Mg Tabs (Paroxetine hcl) .Marland Kitchen... Take 1 tab by mouth qd  Problem # 5:  BLADDER PROLAPSE (ICD-596.9) Assessment: Unchanged  Problem # 6:  CONSTIPATION (ICD-564.00) Assessment: Unchanged  Her updated medication list for this problem includes:    Lactulose 20 Gm/25ml Soln (Lactulose) .Marland KitchenMarland KitchenMarland KitchenMarland Kitchen 30-60 cc once daily as needed constipation  Orders: Gastroenterology Referral (GI) Dr Marina Goodell  Complete Medication List: 1)  Maxzide-25 37.5-25 Mg Tabs (Triamterene-hctz) .Marland Kitchen.. 1 by mouth once daily in am 2)  Atenolol 100 Mg Tabs (Atenolol) .Marland Kitchen.. 1 by  mouth at bedtime 3)  Crestor 40 Mg Tabs (Rosuvastatin calcium) .... Once daily 4)  Paxil 20 Mg Tabs (Paroxetine hcl) .... Take 1 tab by mouth qd 5)  Baby Aspirin 81 Mg Chew (Aspirin) .... Once daily 6)  Centrum Silver Chew (Multiple vitamins-minerals) .... Once daily 7)  Amlodipine Besylate 5 Mg Tabs (Amlodipine besylate) .Marland Kitchen.. 1 by mouth every day 8)  Vitamin D3 1000 Unit Tabs (Cholecalciferol) .Marland Kitchen.. 1 by mouth daily 9)  Triamcinolone Acetonide 0.5 % Crea (Triamcinolone acetonide) .... Apply bid to affected area 10)  Cozaar 100 Mg Tabs (Losartan potassium) .Marland Kitchen.. 1 by mouth qd 11)  Lactulose 20 Gm/40ml Soln (Lactulose) .... 30-60 cc once daily as needed constipation 12)  Ciprofloxacin Hcl 250 Mg Tabs (Ciprofloxacin hcl) .Marland Kitchen.. 1 by mouth two times a day for cystitis  Other Orders: Tdap => 16yrs IM ([address]) Admin 1st Vaccine ([address])  Patient Instructions: 1)  Please schedule a follow-up appointment in 6 months. 2)  Amitiza 1-2 for constipation Prescriptions: CIPROFLOXACIN HCL 250 MG TABS (CIPROFLOXACIN HCL) 1 by mouth two times a day for cystitis  #10 x 0   Entered and Authorized by:   Tresa Garter MD   Signed by:   Tresa Garter MD on 04/30/2010   Method used:   Electronically to        [facility]* (retail)       [address]       [place], [place]  [id number]       Ph: [phone]       Fax: [phone]   RxID:   [id number]    Immunizations Administered:  Tetanus Vaccine:    Vaccine Type: Tdap    Site: left deltoid    Mfr: GlaxoSmithKline    Dose: 0.5 ml    Route: IM    Given by: Lanier Prude, CMA(AAMA)    Exp. Date: 03/13/2012    Lot #: [id number]    VIS given: 08/02/07 version given April 30, 2010.

## 2010-10-14 NOTE — Progress Notes (Signed)
Summary: Pessary question  ---- Converted from flag ---- ---- 07/16/2010 1:13 PM, Eugene Garnet wrote: Britta Mccreedy,  call [name] @[phone] . She has a couple of questions DOB 12-Jul-1931. ------------------------------  Phone Note Call from Patient   Summary of Call: called patient back and she wants to know if she should take her Pessary out that holds bladder up prior to having her colon?  Should she contact her Gynecologist regarding this? Please advise.   Initial call taken by: Milford Cage NCMA,  July 16, 2010 3:44 PM  Follow-up for Phone Call        Generally not a problem, but yes, have her  check w/ GYN  Called patient and notified.  L/M on voicemail. Milford Cage [facility]  July 16, 2010 4:33 PM  Follow-up by: Hilarie Fredrickson MD,  July 16, 2010 4:12 PM

## 2010-10-14 NOTE — Letter (Signed)
Summary: Referral - not able to see patient  [facility]  [address] [place], [place] [address]   Phone: [phone]  Fax: [phone]    November 25, 2009    Jacinta Shoe, M.D. [address] [place], [place] [address]    Re:   KALEEYA HANCOCK DOB:  07/02/1931 MRN:   [id number]    Dear Dr. Posey Rea:  Thank you for your kind referral of the above patient.  We have attempted to schedule the recommended procedure Screening Colonoscopy but have not been able to schedule because:   X  The patient was not available by phone and/or has not returned our calls.  ___ The patient declined to schedule the procedure at this time.  We appreciate the referral and hope that we will have the opportunity to treat this patient in the future.    Sincerely,    [facility] [phone]     Appended Document: Referral - not able to see patient noted

## 2010-10-14 NOTE — Progress Notes (Signed)
Summary: Constipation  Phone Note Call from Patient Call back at Home Phone [phone]   Caller: Patient Summary of Call: Pt still c/o constipation and is asking if there something else that she can be prescribed to take? Initial call taken by: Brenton Grills MA,  April 09, 2010 10:47 AM  Follow-up for Phone Call        Try MOM with Cascara prn Follow-up by: Tresa Garter MD,  April 09, 2010 5:13 PM  Additional Follow-up for Phone Call Additional follow up Details #1::        left detailed vm on hm # Additional Follow-up by: Lamar Sprinkles, CMA,  April 09, 2010 5:21 PM

## 2010-11-04 ENCOUNTER — Ambulatory Visit: Payer: Self-pay | Admitting: Internal Medicine

## 2010-11-25 LAB — GLUCOSE, CAPILLARY: Glucose-Capillary: 100 mg/dL — ABNORMAL HIGH (ref 70–99)

## 2010-12-08 ENCOUNTER — Telehealth: Payer: Self-pay | Admitting: Internal Medicine

## 2010-12-08 NOTE — Telephone Encounter (Signed)
OK to call in a Zpac (250 mg 2 on day #1 and 1 a day Day #2-5). Keep ROV Thank you!

## 2010-12-08 NOTE — Telephone Encounter (Signed)
Pt states that she was sick last week w/fever and body aches; believes it to be "the stomach flu that is going around". Pt states that she got better but is now sick again w/same Sxs as before.  Pt states she has an appt on 12/17/10 but would like to know if she could get an ABX for now.?

## 2010-12-09 MED ORDER — AZITHROMYCIN 250 MG PO TABS: ORAL_TABLET | ORAL | Status: AC

## 2010-12-09 NOTE — Telephone Encounter (Signed)
Called in Rx to [facility]. Patient informed.

## 2010-12-17 ENCOUNTER — Ambulatory Visit (INDEPENDENT_AMBULATORY_CARE_PROVIDER_SITE_OTHER): Payer: Medicare Other | Admitting: Internal Medicine

## 2010-12-17 ENCOUNTER — Encounter: Payer: Self-pay | Admitting: Internal Medicine

## 2010-12-17 DIAGNOSIS — M810 Age-related osteoporosis without current pathological fracture: Secondary | ICD-10-CM

## 2010-12-17 DIAGNOSIS — R05 Cough: Secondary | ICD-10-CM

## 2010-12-17 DIAGNOSIS — E119 Type 2 diabetes mellitus without complications: Secondary | ICD-10-CM

## 2010-12-17 DIAGNOSIS — K5900 Constipation, unspecified: Secondary | ICD-10-CM

## 2010-12-17 DIAGNOSIS — E785 Hyperlipidemia, unspecified: Secondary | ICD-10-CM

## 2010-12-17 DIAGNOSIS — F329 Major depressive disorder, single episode, unspecified: Secondary | ICD-10-CM

## 2010-12-17 MED ORDER — VITAMIN D 1000 UNITS PO TABS: 1000.0000 [IU] | ORAL_TABLET | Freq: Every day | ORAL | Status: DC

## 2010-12-17 NOTE — Progress Notes (Signed)
  Subjective:    Patient ID: Vickie Wilson, female    DOB: 06-12-1931, 77 y.o.   MRN: [id number]  HPI The patient presents for a follow-up of  chronic hypertension, chronic dyslipidemia, type 2 diabetes controlled with medicines  She had a URI with a very bad cough...   Review of Systems  Constitutional: Negative for activity change and unexpected weight change.  HENT: Negative for hearing loss and mouth sores.   Eyes: Negative for pain.  Respiratory: Positive for cough (much better). Negative for chest tightness and wheezing.   Cardiovascular: Negative for leg swelling.  Gastrointestinal: Negative for constipation and blood in stool.  Genitourinary: Negative for difficulty urinating.  Musculoskeletal: Negative for back pain.  Skin: Negative for pallor.  Neurological: Negative for dizziness and tremors.  Psychiatric/Behavioral: Negative for sleep disturbance. The patient is nervous/anxious.        BP Readings from Last 3 Encounters:  12/17/10 140/90  04/30/10 150/82  01/28/10 140/88    Objective:   Physical Exam  Constitutional: She appears well-developed and well-nourished. No distress.  HENT:  Head: Normocephalic.  Right Ear: External ear normal.  Left Ear: External ear normal.  Nose: Nose normal.  Mouth/Throat: Oropharynx is clear and moist.  Eyes: Conjunctivae are normal. Pupils are equal, round, and reactive to light. Right eye exhibits no discharge. Left eye exhibits no discharge.  Neck: Normal range of motion. Neck supple. No JVD present. No tracheal deviation present. No thyromegaly present.  Cardiovascular: Normal rate, regular rhythm and normal heart sounds.  Exam reveals no gallop.   No murmur heard. Pulmonary/Chest: No stridor. No respiratory distress. She has no wheezes.  Abdominal: Soft. Bowel sounds are normal. She exhibits no distension and no mass. There is no tenderness. There is no rebound and no guarding.  Genitourinary: Guaiac negative stool.    Musculoskeletal: She exhibits no edema and no tenderness.  Lymphadenopathy:    She has no cervical adenopathy.  Neurological: She displays normal reflexes. No cranial nerve deficit. She exhibits normal muscle tone. Coordination normal.  Skin: No rash noted. No erythema.  Psychiatric: She has a normal mood and affect. Her behavior is normal. Judgment and thought content normal.       Lab Results  Component Value Date   WBC 5.6 04/29/2010   HGB 12.8 04/29/2010   HGB NEGATIVE 04/29/2010   HCT 37.8 04/29/2010   PLT 322.0 04/29/2010   CHOL 198 04/29/2010   TRIG 83.0 04/29/2010   HDL 55.90 04/29/2010   LDLDIRECT 129.0 01/27/2010   ALT 17 04/29/2010   AST 27 04/29/2010   NA 138 04/29/2010   K 5.1 04/29/2010   CL 98 04/29/2010   CREATININE 1.1 04/29/2010   BUN 26* 04/29/2010   CO2 34* 04/29/2010   TSH 3.45 04/29/2010   HGBA1C 6.2 07/10/2009      Assessment & Plan:  CONSTIPATION She had a nl colon. It has resolved on fiber  DEPRESSION Cont on Paxil  DIABETES MELLITUS, TYPE II On diet  COUGH Post URI - Zpac helped. She thinks she had a whooping cough  OSTEOPOROSIS On Vit D  HYPERLIPIDEMIA On Rx

## 2010-12-17 NOTE — Assessment & Plan Note (Signed)
Cont on Paxil. 

## 2010-12-17 NOTE — Assessment & Plan Note (Signed)
On Vit D 

## 2010-12-17 NOTE — Assessment & Plan Note (Signed)
She had a nl colon. It has resolved on fiber

## 2010-12-17 NOTE — Assessment & Plan Note (Signed)
Post URI - Zpac helped. She thinks she had a whooping cough

## 2010-12-17 NOTE — Assessment & Plan Note (Signed)
  On diet  

## 2010-12-17 NOTE — Assessment & Plan Note (Signed)
On Rx 

## 2010-12-18 ENCOUNTER — Telehealth: Payer: Self-pay | Admitting: Internal Medicine

## 2010-12-18 ENCOUNTER — Other Ambulatory Visit (INDEPENDENT_AMBULATORY_CARE_PROVIDER_SITE_OTHER): Payer: Medicare Other

## 2010-12-18 DIAGNOSIS — E119 Type 2 diabetes mellitus without complications: Secondary | ICD-10-CM

## 2010-12-18 DIAGNOSIS — K5900 Constipation, unspecified: Secondary | ICD-10-CM

## 2010-12-18 DIAGNOSIS — E785 Hyperlipidemia, unspecified: Secondary | ICD-10-CM

## 2010-12-18 LAB — COMPREHENSIVE METABOLIC PANEL
ALT: 16 U/L (ref 0–35)
AST: 23 U/L (ref 0–37)
Albumin: 3.6 g/dL (ref 3.5–5.2)
Alkaline Phosphatase: 48 U/L (ref 39–117)
BUN: 33 mg/dL — ABNORMAL HIGH (ref 6–23)
CO2: 29 mEq/L (ref 19–32)
Calcium: 9.3 mg/dL (ref 8.4–10.5)
Chloride: 99 mEq/L (ref 96–112)
Creatinine, Ser: 1.5 mg/dL — ABNORMAL HIGH (ref 0.4–1.2)
GFR: 34.64 mL/min — ABNORMAL LOW (ref >60.00)
Glucose, Bld: 95 mg/dL (ref 70–99)
Potassium: 4.9 mEq/L (ref 3.5–5.1)
Sodium: 139 mEq/L (ref 135–145)
Total Bilirubin: 0.7 mg/dL (ref 0.3–1.2)
Total Protein: 6.8 g/dL (ref 6.0–8.3)

## 2010-12-18 LAB — LIPID PANEL
Cholesterol: 159 mg/dL (ref 0–200)
HDL: 42.4 mg/dL (ref >39.00)
LDL Cholesterol: 96 mg/dL (ref 0–99)
Total CHOL/HDL Ratio: 4
Triglycerides: 105 mg/dL (ref 0.0–149.0)
VLDL: 21 mg/dL (ref 0.0–40.0)

## 2010-12-18 LAB — HEMOGLOBIN A1C: Hgb A1c MFr Bld: 6.7 % — ABNORMAL HIGH (ref 4.6–6.5)

## 2010-12-18 LAB — TSH: TSH: 1.73 u[IU]/mL (ref 0.35–5.50)

## 2010-12-18 NOTE — Telephone Encounter (Signed)
Stacey , please, inform the patient:  the labs are normal, except for    Please, keep  next office visit appointment.   Thank you !   

## 2010-12-21 ENCOUNTER — Telehealth: Payer: Self-pay | Admitting: Internal Medicine

## 2010-12-21 NOTE — Telephone Encounter (Signed)
Stacey , please, inform the patient:  the labs are ok Please, keep  next office visit appointment.   Thank you !   

## 2010-12-22 NOTE — Telephone Encounter (Signed)
Left detailed mess informing of below.  

## 2010-12-23 NOTE — Telephone Encounter (Signed)
Pt informed

## 2011-01-27 ENCOUNTER — Other Ambulatory Visit: Payer: Self-pay | Admitting: *Deleted

## 2011-01-27 MED ORDER — AMLODIPINE BESYLATE 5 MG PO TABS: 5.0000 mg | ORAL_TABLET | Freq: Every day | ORAL | Status: DC

## 2011-02-02 ENCOUNTER — Other Ambulatory Visit: Payer: Self-pay | Admitting: Internal Medicine

## 2011-02-17 ENCOUNTER — Other Ambulatory Visit: Payer: Self-pay | Admitting: Internal Medicine

## 2011-02-26 ENCOUNTER — Other Ambulatory Visit: Payer: Self-pay | Admitting: Internal Medicine

## 2011-04-02 ENCOUNTER — Other Ambulatory Visit: Payer: Self-pay | Admitting: Internal Medicine

## 2011-04-20 ENCOUNTER — Telehealth: Payer: Self-pay | Admitting: *Deleted

## 2011-04-20 ENCOUNTER — Other Ambulatory Visit: Payer: Self-pay | Admitting: *Deleted

## 2011-04-20 DIAGNOSIS — I10 Essential (primary) hypertension: Secondary | ICD-10-CM

## 2011-04-20 DIAGNOSIS — E785 Hyperlipidemia, unspecified: Secondary | ICD-10-CM

## 2011-04-20 DIAGNOSIS — R5383 Other fatigue: Secondary | ICD-10-CM

## 2011-04-20 NOTE — Telephone Encounter (Signed)
Lipid, CMET and TSH per Dr. Posey Rea. Lab order entered.

## 2011-04-20 NOTE — Telephone Encounter (Signed)
Pt came in for labs today prior to appt on Wed. No labs ordered. Please advise

## 2011-04-21 ENCOUNTER — Other Ambulatory Visit: Payer: Self-pay | Admitting: Internal Medicine

## 2011-04-21 ENCOUNTER — Other Ambulatory Visit (INDEPENDENT_AMBULATORY_CARE_PROVIDER_SITE_OTHER): Payer: Medicare Other

## 2011-04-21 DIAGNOSIS — E785 Hyperlipidemia, unspecified: Secondary | ICD-10-CM

## 2011-04-21 DIAGNOSIS — I10 Essential (primary) hypertension: Secondary | ICD-10-CM

## 2011-04-21 DIAGNOSIS — R5381 Other malaise: Secondary | ICD-10-CM

## 2011-04-21 DIAGNOSIS — R5383 Other fatigue: Secondary | ICD-10-CM

## 2011-04-21 LAB — COMPREHENSIVE METABOLIC PANEL
ALT: 14 U/L (ref 0–35)
AST: 27 U/L (ref 0–37)
Albumin: 3.9 g/dL (ref 3.5–5.2)
Alkaline Phosphatase: 39 U/L (ref 39–117)
BUN: 30 mg/dL — ABNORMAL HIGH (ref 6–23)
CO2: 29 mEq/L (ref 19–32)
Calcium: 8.9 mg/dL (ref 8.4–10.5)
Chloride: 101 mEq/L (ref 96–112)
Creatinine, Ser: 1.1 mg/dL (ref 0.4–1.2)
GFR: 48.97 mL/min — ABNORMAL LOW (ref >60.00)
Glucose, Bld: 93 mg/dL (ref 70–99)
Potassium: 4 mEq/L (ref 3.5–5.1)
Sodium: 138 mEq/L (ref 135–145)
Total Bilirubin: 0.8 mg/dL (ref 0.3–1.2)
Total Protein: 7 g/dL (ref 6.0–8.3)

## 2011-04-21 LAB — LIPID PANEL
Cholesterol: 201 mg/dL — ABNORMAL HIGH (ref 0–200)
HDL: 72.1 mg/dL (ref >39.00)
Total CHOL/HDL Ratio: 3
Triglycerides: 50 mg/dL (ref 0.0–149.0)
VLDL: 10 mg/dL (ref 0.0–40.0)

## 2011-04-21 LAB — LDL CHOLESTEROL, DIRECT: Direct LDL: 107 mg/dL

## 2011-04-21 LAB — TSH: TSH: 2.08 u[IU]/mL (ref 0.35–5.50)

## 2011-04-22 ENCOUNTER — Encounter: Payer: Self-pay | Admitting: Internal Medicine

## 2011-04-22 ENCOUNTER — Ambulatory Visit (INDEPENDENT_AMBULATORY_CARE_PROVIDER_SITE_OTHER): Payer: Medicare Other | Admitting: Internal Medicine

## 2011-04-22 DIAGNOSIS — M79671 Pain in right foot: Secondary | ICD-10-CM

## 2011-04-22 DIAGNOSIS — I10 Essential (primary) hypertension: Secondary | ICD-10-CM

## 2011-04-22 DIAGNOSIS — M810 Age-related osteoporosis without current pathological fracture: Secondary | ICD-10-CM

## 2011-04-22 DIAGNOSIS — M79609 Pain in unspecified limb: Secondary | ICD-10-CM

## 2011-04-22 DIAGNOSIS — E785 Hyperlipidemia, unspecified: Secondary | ICD-10-CM

## 2011-04-22 NOTE — Progress Notes (Signed)
  Subjective:    Patient ID: Vickie Wilson, female    DOB: 09-02-1931, 77 y.o.   MRN: [id number]  HPI   The patient presents for a follow-up of  chronic hypertension, chronic dyslipidemia, vit D def controlled with medicines. C/o R foot arch is hurt x 2 mo - burning. BP is ok at home.  BP Readings from Last 3 Encounters:  04/22/11 160/90  12/17/10 140/90  04/30/10 150/82     Review of Systems  Constitutional: Negative for chills, activity change, appetite change, fatigue and unexpected weight change.  HENT: Negative for congestion, mouth sores and sinus pressure.   Eyes: Negative for visual disturbance.  Respiratory: Negative for cough and chest tightness.   Gastrointestinal: Negative for nausea and abdominal pain.  Genitourinary: Negative for frequency, difficulty urinating and vaginal pain.  Musculoskeletal: Positive for myalgias and arthralgias. Negative for back pain, joint swelling and gait problem.  Skin: Negative for pallor and rash.  Neurological: Negative for dizziness, tremors, weakness, numbness and headaches.  Psychiatric/Behavioral: Negative for confusion and sleep disturbance.       Objective:   Physical Exam  Constitutional: She appears well-developed and well-nourished. No distress.  HENT:  Head: Normocephalic.  Right Ear: External ear normal.  Left Ear: External ear normal.  Nose: Nose normal.  Mouth/Throat: Oropharynx is clear and moist.  Eyes: Conjunctivae are normal. Pupils are equal, round, and reactive to light. Right eye exhibits no discharge. Left eye exhibits no discharge.  Neck: Normal range of motion. Neck supple. No JVD present. No tracheal deviation present. No thyromegaly present.  Cardiovascular: Normal rate, regular rhythm and normal heart sounds.   Pulmonary/Chest: No stridor. No respiratory distress. She has no wheezes.  Abdominal: Soft. Bowel sounds are normal. She exhibits no distension and no mass. There is no tenderness. There is no  rebound and no guarding.  Musculoskeletal: She exhibits tenderness (L foot arch is tender). She exhibits no edema.  Lymphadenopathy:    She has no cervical adenopathy.  Neurological: She displays normal reflexes. No cranial nerve deficit. She exhibits normal muscle tone. Coordination normal.  Skin: No rash noted. No erythema.  Psychiatric: She has a normal mood and affect. Her behavior is normal. Judgment and thought content normal.  Heels NT High arches   Lab Results  Component Value Date   WBC 5.6 04/29/2010   HGB 12.8 04/29/2010   HCT 37.8 04/29/2010   PLT 322.0 04/29/2010   CHOL 201* 04/21/2011   TRIG 50.0 04/21/2011   HDL 72.10 04/21/2011   LDLDIRECT 107.0 04/21/2011   ALT 14 04/21/2011   AST 27 04/21/2011   NA 138 04/21/2011   K 4.0 04/21/2011   CL 101 04/21/2011   CREATININE 1.1 04/21/2011   BUN 30* 04/21/2011   CO2 29 04/21/2011   TSH 2.08 04/21/2011   HGBA1C 6.7* 12/18/2010        Assessment & Plan:

## 2011-04-22 NOTE — Assessment & Plan Note (Signed)
On Rx 

## 2011-04-22 NOTE — Patient Instructions (Signed)
New shoes 

## 2011-04-22 NOTE — Assessment & Plan Note (Signed)
New shoes Arch supports

## 2011-05-04 ENCOUNTER — Other Ambulatory Visit: Payer: Self-pay | Admitting: *Deleted

## 2011-05-04 ENCOUNTER — Other Ambulatory Visit: Payer: Self-pay | Admitting: Internal Medicine

## 2011-05-04 MED ORDER — TRIAMTERENE-HCTZ 75-50 MG PO TABS: 0.5000 | ORAL_TABLET | Freq: Every day | ORAL | Status: DC

## 2011-06-23 ENCOUNTER — Other Ambulatory Visit: Payer: Self-pay | Admitting: Internal Medicine

## 2011-06-23 ENCOUNTER — Ambulatory Visit (INDEPENDENT_AMBULATORY_CARE_PROVIDER_SITE_OTHER): Payer: Medicare Other

## 2011-06-23 DIAGNOSIS — Z23 Encounter for immunization: Secondary | ICD-10-CM

## 2011-07-27 ENCOUNTER — Other Ambulatory Visit: Payer: Self-pay | Admitting: *Deleted

## 2011-07-27 MED ORDER — PAROXETINE HCL 20 MG PO TABS: 20.0000 mg | ORAL_TABLET | ORAL | Status: DC

## 2011-07-27 MED ORDER — AMLODIPINE BESYLATE 5 MG PO TABS: 5.0000 mg | ORAL_TABLET | Freq: Every day | ORAL | Status: DC

## 2011-09-01 ENCOUNTER — Ambulatory Visit (INDEPENDENT_AMBULATORY_CARE_PROVIDER_SITE_OTHER): Payer: Medicare Other | Admitting: Internal Medicine

## 2011-09-01 ENCOUNTER — Encounter: Payer: Self-pay | Admitting: Internal Medicine

## 2011-09-01 VITALS — BP 160/98 | HR 80 | Temp 98.2°F | Resp 16 | Wt 124.0 lb

## 2011-09-01 DIAGNOSIS — E119 Type 2 diabetes mellitus without complications: Secondary | ICD-10-CM

## 2011-09-01 DIAGNOSIS — M2550 Pain in unspecified joint: Secondary | ICD-10-CM | POA: Insufficient documentation

## 2011-09-01 DIAGNOSIS — I10 Essential (primary) hypertension: Secondary | ICD-10-CM

## 2011-09-01 DIAGNOSIS — E785 Hyperlipidemia, unspecified: Secondary | ICD-10-CM

## 2011-09-01 DIAGNOSIS — F329 Major depressive disorder, single episode, unspecified: Secondary | ICD-10-CM

## 2011-09-01 NOTE — Assessment & Plan Note (Signed)
Coping w/stress ok

## 2011-09-01 NOTE — Assessment & Plan Note (Signed)
Continue with current prescription therapy as reflected on the Med list.  

## 2011-09-01 NOTE — Patient Instructions (Signed)
Hold crestor x 1-2 months

## 2011-09-01 NOTE — Progress Notes (Signed)
  Subjective:    Patient ID: [name], female    DOB: 04-Jun-1931, 77 y.o.   MRN: [id number]  HPI  The patient presents for a follow-up of  chronic hypertension, chronic dyslipidemia, type 2 diabetes controlled with medicines  C/o achyness all over, LBP  Review of Systems  Constitutional: Negative.  Negative for fever, chills, diaphoresis, activity change, appetite change, fatigue and unexpected weight change.  HENT: Negative for hearing loss, ear pain, nosebleeds, congestion, sore throat, facial swelling, rhinorrhea, sneezing, mouth sores, trouble swallowing, neck pain, neck stiffness, postnasal drip, sinus pressure and tinnitus.   Eyes: Negative for pain, discharge, redness, itching and visual disturbance.  Respiratory: Negative for cough, chest tightness, shortness of breath, wheezing and stridor.   Cardiovascular: Negative for chest pain, palpitations and leg swelling.  Gastrointestinal: Negative for nausea, abdominal pain, diarrhea, constipation, blood in stool, abdominal distention, anal bleeding and rectal pain.  Genitourinary: Negative for dysuria, urgency, frequency, hematuria, flank pain, vaginal bleeding, vaginal discharge, difficulty urinating, genital sores, vaginal pain and pelvic pain.  Musculoskeletal: Positive for back pain and arthralgias. Negative for joint swelling and gait problem.  Skin: Negative.  Negative for pallor and rash.  Neurological: Negative for dizziness, tremors, seizures, syncope, speech difficulty, weakness, numbness and headaches.  Hematological: Negative for adenopathy. Does not bruise/bleed easily.  Psychiatric/Behavioral: Negative for suicidal ideas, behavioral problems, confusion, sleep disturbance, dysphoric mood and decreased concentration. The patient is not nervous/anxious.        Objective:   Physical Exam  Constitutional: She appears well-developed and well-nourished. No distress.  HENT:  Head: Normocephalic.  Right Ear: External ear  normal.  Left Ear: External ear normal.  Nose: Nose normal.  Mouth/Throat: Oropharynx is clear and moist.  Eyes: Conjunctivae are normal. Pupils are equal, round, and reactive to light. Right eye exhibits no discharge. Left eye exhibits no discharge.  Neck: Normal range of motion. Neck supple. No JVD present. No tracheal deviation present. No thyromegaly present.  Cardiovascular: Normal rate, regular rhythm and normal heart sounds.   Pulmonary/Chest: No stridor. No respiratory distress. She has no wheezes.  Abdominal: Soft. Bowel sounds are normal. She exhibits no distension and no mass. There is no tenderness. There is no rebound and no guarding.  Musculoskeletal: She exhibits tenderness (over muscles). She exhibits no edema.  Lymphadenopathy:    She has no cervical adenopathy.  Neurological: She displays normal reflexes. No cranial nerve deficit. She exhibits normal muscle tone. Coordination normal.  Skin: No rash noted. No erythema.  Psychiatric: She has a normal mood and affect. Her behavior is normal. Judgment and thought content normal.          Assessment & Plan:

## 2011-09-01 NOTE — Assessment & Plan Note (Signed)
Could be related to Crestor 12- 2012 Hold Crestor

## 2011-09-01 NOTE — Assessment & Plan Note (Addendum)
Continue with current diet  

## 2011-09-01 NOTE — Assessment & Plan Note (Signed)
Hold Crestor due to pains

## 2011-09-09 ENCOUNTER — Other Ambulatory Visit: Payer: Self-pay | Admitting: Internal Medicine

## 2011-10-26 ENCOUNTER — Other Ambulatory Visit: Payer: Self-pay | Admitting: Internal Medicine

## 2011-12-01 ENCOUNTER — Other Ambulatory Visit: Payer: Self-pay | Admitting: Internal Medicine

## 2011-12-30 ENCOUNTER — Ambulatory Visit: Payer: Medicare Other | Admitting: Internal Medicine

## 2012-02-15 ENCOUNTER — Other Ambulatory Visit: Payer: Self-pay | Admitting: Internal Medicine

## 2012-02-22 ENCOUNTER — Telehealth: Payer: Self-pay | Admitting: Internal Medicine

## 2012-02-22 NOTE — Telephone Encounter (Signed)
OV this wk w/any MD Thx

## 2012-02-22 NOTE — Telephone Encounter (Signed)
Caller: Taquanna If [facility] [phone]/Other; PCP: Sonda Primes; CB#: [phone]; ; ; Call regarding Rash/Hives;   Rash is sort of welp like w/ bumps on Face and Lower Abdomen, itchy, onset 6-7. All emergent sxs r/o per Hives Protocol.  Discussed Benadryl, advised Pt to f/u w/ office w/n 24 hrs for an appt if sxs don't improve.  Pt verbalized understanding.

## 2012-02-23 ENCOUNTER — Encounter: Payer: Self-pay | Admitting: Internal Medicine

## 2012-02-23 ENCOUNTER — Ambulatory Visit (INDEPENDENT_AMBULATORY_CARE_PROVIDER_SITE_OTHER): Payer: Medicare Other | Admitting: Internal Medicine

## 2012-02-23 ENCOUNTER — Telehealth: Payer: Self-pay

## 2012-02-23 VITALS — BP 152/80 | HR 66 | Temp 97.8°F | Ht 61.0 in | Wt 124.0 lb

## 2012-02-23 DIAGNOSIS — E119 Type 2 diabetes mellitus without complications: Secondary | ICD-10-CM

## 2012-02-23 DIAGNOSIS — I10 Essential (primary) hypertension: Secondary | ICD-10-CM

## 2012-02-23 DIAGNOSIS — L509 Urticaria, unspecified: Secondary | ICD-10-CM

## 2012-02-23 MED ORDER — PREDNISONE 10 MG PO TABS: ORAL_TABLET | ORAL | Status: DC

## 2012-02-23 NOTE — Patient Instructions (Signed)
Take all new medications as prescribed  - the prednisone, and the steroid cream as needed You can continue the benadryl 25 mg every 6 hrs as needed for rash, itching, swelling Please also take 10 days of zantac 150 mg twice per day which can also help with allergies Continue all other medications as before Please consider you may have a new seafood allergy, based on your history today, and avoid similar food in the future

## 2012-02-23 NOTE — Assessment & Plan Note (Signed)
New onset, doubt med related or oTC, but may have new seafood allergy; ok to cont the benadrly OTC prn, declines depomedrol IM today, but will take predpack asd, zantac bid, and  to f/u any worsening symptoms or concerns

## 2012-02-23 NOTE — Telephone Encounter (Signed)
Yes, this is true for the next 10 days, then can stop

## 2012-02-23 NOTE — Telephone Encounter (Signed)
The patients AVS stated to take Zantac BID for allergies, please advise

## 2012-02-23 NOTE — Progress Notes (Signed)
Subjective:    Patient ID: Vickie Wilson, female    DOB: [birth year], 78 y.o.   MRN: [id number]  HPI  Here for acute visit, has seafood (cod) that may have "not tasted the best", now with onset hive like rash the day after, persistent to today with angioedematous like swelling as well to the left submandibular area.  No other throat, tongue swelling, sob or wheezing.  Rash primarily large to the left face, neck, and LLQ/groin area, but also small lesions to right temporal area and small areas to the arms.  Quite pruritic, no pain, fever, trauma.  Benadryl 25 mg helped last night, but seemed a bit "woozy" this am.  States she is very sensitive to medications.   Pt denies fever, wt loss, night sweats, loss of appetite, or other constitutional symptoms  No prior hx of hives, significant allergies, though has had eczema in the past. Past Medical History  Diagnosis Date  . Anxiety   . Depression   . Hypertension   . Allergy   . Osteoporosis   . Constipation   . Diabetes mellitus   . Hyperlipidemia    Past Surgical History  Procedure Date  . Dilation and curettage of uterus 1987    reports that she has quit smoking. She does not have any smokeless tobacco history on file. She reports that she does not drink alcohol or use illicit drugs. family history includes Alcohol abuse in her father; Heart disease in her mother; Hyperlipidemia in her other; Hypertension in her other; and Stroke in her other. Allergies  Allergen Reactions  . Captopril     REACTION: cough   Current Outpatient Prescriptions on File Prior to Visit  Medication Sig Dispense Refill  . amLODipine (NORVASC) 5 MG tablet TAKE 1 TABLET ONCE DAILY.  30 tablet  6  . aspirin 81 MG tablet Take 81 mg by mouth daily.        Marland Kitchen atenolol (TENORMIN) 100 MG tablet TAKE (1/2) TABLET TWICE DAILY.  30 tablet  5  . COZAAR 100 MG tablet TAKE 1 TABLET EACH DAY.  90 each  1  . MAXZIDE 75-50 MG per tablet TAKE 1/2 TABLET DAILY IN THE AM.  15 each  5    . Multiple Vitamins-Minerals (CENTRUM SILVER) CHEW Chew 1 each by mouth daily.        Marland Kitchen PARoxetine (PAXIL) 20 MG tablet Take 1 tablet (20 mg total) by mouth every morning.  30 tablet  5  . cholecalciferol (VITAMIN D) 1000 UNITS tablet Take 1 tablet (1,000 Units total) by mouth daily.  30 tablet  11  . CRESTOR 40 MG tablet TAKE 1 TABLET DAILY.  30 each  2   Review of Systems Review of Systems  Constitutional: Negative for diaphoresis and unexpected weight change.   Eyes: Negative for photophobia and visual disturbance.  Respiratory: Negative for choking and stridor.   Gastrointestinal: Negative for vomiting and blood in stool.  Genitourinary: Negative for hematuria and decreased urine volume.  Musculoskeletal: Negative for gait problem.  Neurological: Negative for tremors and numbness.  Psychiatric/Behavioral: Negative for decreased concentration. The patient is not hyperactive.      Objective:   Physical Exam BP 152/80  Pulse 66  Temp(Src) 97.8 F (36.6 C) (Oral)  Ht 5\' 1"  (1.549 m)  Wt 124 lb (56.246 kg)  BMI 23.43 kg/m2  SpO2 97% Physical Exam  VS noted Constitutional: Pt appears well-developed and well-nourished.  HENT: Head: Normocephalic.  Right Ear: External ear normal.  Left Ear: External ear normal.  Eyes: Conjunctivae and EOM are normal. Pupils are equal, round, and reactive to light.  Neck: Normal range of motion. Neck supple.  Cardiovascular: Normal rate and regular rhythm.   Pulmonary/Chest: Effort normal and breath sounds normal.  Abd:  Soft, NT, non-distended, + BS Neurological: Pt is alert. No cranial nerve deficit.  Skin: Skin is warm, and hive like wheal and flare noted to areas mentioned in the HPI, as well as small angioedematous area left neck submandibular, nontender Psychiatric: Pt behavior is normal. Thought content normal. 1+ nervous    Assessment & Plan:

## 2012-02-23 NOTE — Assessment & Plan Note (Signed)
stable overall but mild elevated overal by hx and exam, most recent data reviewed with pt, and pt to continue medical treatment as before, declines change in other tx for now, wants to f/u with PCP BP Readings from Last 3 Encounters:  02/23/12 152/80  09/01/11 160/98  04/22/11 160/90

## 2012-02-23 NOTE — Assessment & Plan Note (Signed)
stable overall by hx and exam, most recent data reviewed with pt, and pt to continue medical treatment as before Lab Results  Component Value Date   HGBA1C 6.7* 12/18/2010   Pt to monitor for onset polys or cbg > 200

## 2012-02-24 NOTE — Telephone Encounter (Signed)
Patient informed. 

## 2012-02-24 NOTE — Telephone Encounter (Signed)
Called left message to call back 

## 2012-03-18 ENCOUNTER — Other Ambulatory Visit: Payer: Self-pay | Admitting: Internal Medicine

## 2012-03-24 ENCOUNTER — Other Ambulatory Visit (INDEPENDENT_AMBULATORY_CARE_PROVIDER_SITE_OTHER): Payer: Medicare Other

## 2012-03-24 DIAGNOSIS — F3289 Other specified depressive episodes: Secondary | ICD-10-CM

## 2012-03-24 DIAGNOSIS — M2550 Pain in unspecified joint: Secondary | ICD-10-CM

## 2012-03-24 DIAGNOSIS — E785 Hyperlipidemia, unspecified: Secondary | ICD-10-CM

## 2012-03-24 DIAGNOSIS — F329 Major depressive disorder, single episode, unspecified: Secondary | ICD-10-CM

## 2012-03-24 DIAGNOSIS — I10 Essential (primary) hypertension: Secondary | ICD-10-CM

## 2012-03-24 DIAGNOSIS — E119 Type 2 diabetes mellitus without complications: Secondary | ICD-10-CM

## 2012-03-24 LAB — LIPID PANEL
Cholesterol: 325 mg/dL — ABNORMAL HIGH (ref 0–200)
HDL: 66.8 mg/dL (ref >39.00)
Total CHOL/HDL Ratio: 5
Triglycerides: 85 mg/dL (ref 0.0–149.0)
VLDL: 17 mg/dL (ref 0.0–40.0)

## 2012-03-24 LAB — BASIC METABOLIC PANEL
BUN: 29 mg/dL — ABNORMAL HIGH (ref 6–23)
CO2: 30 mEq/L (ref 19–32)
Calcium: 9.3 mg/dL (ref 8.4–10.5)
Chloride: 98 mEq/L (ref 96–112)
Creatinine, Ser: 1.3 mg/dL — ABNORMAL HIGH (ref 0.4–1.2)
GFR: 43.92 mL/min — ABNORMAL LOW (ref >60.00)
Glucose, Bld: 97 mg/dL (ref 70–99)
Potassium: 4.4 mEq/L (ref 3.5–5.1)
Sodium: 136 mEq/L (ref 135–145)

## 2012-03-24 LAB — HEPATIC FUNCTION PANEL
ALT: 12 U/L (ref 0–35)
AST: 25 U/L (ref 0–37)
Albumin: 4 g/dL (ref 3.5–5.2)
Alkaline Phosphatase: 43 U/L (ref 39–117)
Bilirubin, Direct: 0.1 mg/dL (ref 0.0–0.3)
Total Bilirubin: 0.6 mg/dL (ref 0.3–1.2)
Total Protein: 7.2 g/dL (ref 6.0–8.3)

## 2012-03-24 LAB — LDL CHOLESTEROL, DIRECT: Direct LDL: 213.7 mg/dL

## 2012-03-24 LAB — HEMOGLOBIN A1C: Hgb A1c MFr Bld: 6.4 % (ref 4.6–6.5)

## 2012-03-24 LAB — CK: Total CK: 71 U/L (ref 7–177)

## 2012-03-28 ENCOUNTER — Encounter: Payer: Self-pay | Admitting: Internal Medicine

## 2012-03-28 ENCOUNTER — Ambulatory Visit (INDEPENDENT_AMBULATORY_CARE_PROVIDER_SITE_OTHER): Payer: Medicare Other | Admitting: Internal Medicine

## 2012-03-28 VITALS — BP 150/90 | HR 60 | Temp 97.6°F | Resp 16 | Wt 122.0 lb

## 2012-03-28 DIAGNOSIS — E785 Hyperlipidemia, unspecified: Secondary | ICD-10-CM

## 2012-03-28 DIAGNOSIS — F411 Generalized anxiety disorder: Secondary | ICD-10-CM

## 2012-03-28 DIAGNOSIS — F329 Major depressive disorder, single episode, unspecified: Secondary | ICD-10-CM

## 2012-03-28 DIAGNOSIS — I10 Essential (primary) hypertension: Secondary | ICD-10-CM

## 2012-03-28 DIAGNOSIS — F3289 Other specified depressive episodes: Secondary | ICD-10-CM

## 2012-03-28 DIAGNOSIS — E119 Type 2 diabetes mellitus without complications: Secondary | ICD-10-CM

## 2012-03-28 MED ORDER — LOSARTAN POTASSIUM 100 MG PO TABS: 100.0000 mg | ORAL_TABLET | Freq: Every day | ORAL | Status: DC

## 2012-03-28 NOTE — Assessment & Plan Note (Signed)
Worse off Crestor Re-start Crestor

## 2012-03-28 NOTE — Assessment & Plan Note (Signed)
Continue with current prescription therapy as reflected on the Med list.  

## 2012-03-28 NOTE — Progress Notes (Signed)
Patient ID: Vickie Wilson, female   DOB: 04-Jul-1931, 78 y.o.   MRN: [id number]  Subjective:    Patient ID: Vickie Wilson, female    DOB: Jan 01, 1931, 78 y.o.   MRN: [id number]  HPI  The patient presents for a follow-up of  chronic hypertension, chronic dyslipidemia, type 2 diabetes controlled with medicines  BP Readings from Last 3 Encounters:  03/28/12 150/90  02/23/12 152/80  09/01/11 160/98   Wt Readings from Last 3 Encounters:  03/28/12 122 lb (55.339 kg)  02/23/12 124 lb (56.246 kg)  09/01/11 124 lb (56.246 kg)        Review of Systems  Constitutional: Negative.  Negative for fever, chills, diaphoresis, activity change, appetite change, fatigue and unexpected weight change.  HENT: Negative for hearing loss, ear pain, nosebleeds, congestion, sore throat, facial swelling, rhinorrhea, sneezing, mouth sores, trouble swallowing, neck pain, neck stiffness, postnasal drip, sinus pressure and tinnitus.   Eyes: Negative for pain, discharge, redness, itching and visual disturbance.  Respiratory: Negative for cough, chest tightness, shortness of breath, wheezing and stridor.   Cardiovascular: Negative for chest pain, palpitations and leg swelling.  Gastrointestinal: Negative for nausea, abdominal pain, diarrhea, constipation, blood in stool, abdominal distention, anal bleeding and rectal pain.  Genitourinary: Negative for dysuria, urgency, frequency, hematuria, flank pain, vaginal bleeding, vaginal discharge, difficulty urinating, genital sores, vaginal pain and pelvic pain.  Musculoskeletal: Positive for back pain and arthralgias. Negative for joint swelling and gait problem.  Skin: Negative.  Negative for pallor and rash.  Neurological: Negative for dizziness, tremors, seizures, syncope, speech difficulty, weakness, numbness and headaches.  Hematological: Negative for adenopathy. Does not bruise/bleed easily.  Psychiatric/Behavioral: Negative for suicidal ideas, behavioral problems,  confusion, disturbed wake/sleep cycle, dysphoric mood and decreased concentration. The patient is not nervous/anxious.        Objective:   Physical Exam  Constitutional: She appears well-developed and well-nourished. No distress.  HENT:  Head: Normocephalic.  Right Ear: External ear normal.  Left Ear: External ear normal.  Nose: Nose normal.  Mouth/Throat: Oropharynx is clear and moist.  Eyes: Conjunctivae are normal. Pupils are equal, round, and reactive to light. Right eye exhibits no discharge. Left eye exhibits no discharge.  Neck: Normal range of motion. Neck supple. No JVD present. No tracheal deviation present. No thyromegaly present.  Cardiovascular: Normal rate, regular rhythm and normal heart sounds.   Pulmonary/Chest: No stridor. No respiratory distress. She has no wheezes.  Abdominal: Soft. Bowel sounds are normal. She exhibits no distension and no mass. There is no tenderness. There is no rebound and no guarding.  Musculoskeletal: She exhibits tenderness (over muscles). She exhibits no edema.  Lymphadenopathy:    She has no cervical adenopathy.  Neurological: She displays normal reflexes. No cranial nerve deficit. She exhibits normal muscle tone. Coordination normal.  Skin: No rash noted. No erythema.  Psychiatric: She has a normal mood and affect. Her behavior is normal. Judgment and thought content normal.    Lab Results  Component Value Date   WBC 5.6 04/29/2010   HGB 12.8 04/29/2010   HCT 37.8 04/29/2010   PLT 322.0 04/29/2010   GLUCOSE 97 03/24/2012   CHOL 325* 03/24/2012   TRIG 85.0 03/24/2012   HDL 66.80 03/24/2012   LDLDIRECT 213.7 03/24/2012   LDLCALC 96 12/18/2010   ALT 12 03/24/2012   AST 25 03/24/2012   NA 136 03/24/2012   K 4.4 03/24/2012   CL 98 03/24/2012   CREATININE 1.3* 03/24/2012   BUN 29* 03/24/2012  CO2 30 03/24/2012   TSH 2.08 04/21/2011   HGBA1C 6.4 03/24/2012         Assessment & Plan:

## 2012-04-27 ENCOUNTER — Other Ambulatory Visit: Payer: Self-pay | Admitting: Internal Medicine

## 2012-06-02 ENCOUNTER — Other Ambulatory Visit: Payer: Self-pay | Admitting: Internal Medicine

## 2012-07-05 ENCOUNTER — Other Ambulatory Visit (INDEPENDENT_AMBULATORY_CARE_PROVIDER_SITE_OTHER): Payer: Medicare Other

## 2012-07-05 DIAGNOSIS — E119 Type 2 diabetes mellitus without complications: Secondary | ICD-10-CM

## 2012-07-05 DIAGNOSIS — I10 Essential (primary) hypertension: Secondary | ICD-10-CM

## 2012-07-05 DIAGNOSIS — F411 Generalized anxiety disorder: Secondary | ICD-10-CM

## 2012-07-05 DIAGNOSIS — E785 Hyperlipidemia, unspecified: Secondary | ICD-10-CM

## 2012-07-05 DIAGNOSIS — F329 Major depressive disorder, single episode, unspecified: Secondary | ICD-10-CM

## 2012-07-05 LAB — BASIC METABOLIC PANEL
BUN: 34 mg/dL — ABNORMAL HIGH (ref 6–23)
CO2: 30 mEq/L (ref 19–32)
Calcium: 9 mg/dL (ref 8.4–10.5)
Chloride: 97 mEq/L (ref 96–112)
Creatinine, Ser: 1.1 mg/dL (ref 0.4–1.2)
GFR: 50.87 mL/min — ABNORMAL LOW (ref >60.00)
Glucose, Bld: 93 mg/dL (ref 70–99)
Potassium: 3.7 mEq/L (ref 3.5–5.1)
Sodium: 134 mEq/L — ABNORMAL LOW (ref 135–145)

## 2012-07-05 LAB — HEPATIC FUNCTION PANEL
ALT: 14 U/L (ref 0–35)
AST: 25 U/L (ref 0–37)
Albumin: 3.7 g/dL (ref 3.5–5.2)
Alkaline Phosphatase: 39 U/L (ref 39–117)
Bilirubin, Direct: 0.1 mg/dL (ref 0.0–0.3)
Total Bilirubin: 0.5 mg/dL (ref 0.3–1.2)
Total Protein: 6.9 g/dL (ref 6.0–8.3)

## 2012-07-05 LAB — LIPID PANEL
Cholesterol: 195 mg/dL (ref 0–200)
HDL: 63.5 mg/dL (ref >39.00)
LDL Cholesterol: 120 mg/dL — ABNORMAL HIGH (ref 0–99)
Total CHOL/HDL Ratio: 3
Triglycerides: 57 mg/dL (ref 0.0–149.0)
VLDL: 11.4 mg/dL (ref 0.0–40.0)

## 2012-07-07 ENCOUNTER — Ambulatory Visit: Payer: Medicare Other | Admitting: Internal Medicine

## 2012-07-15 ENCOUNTER — Other Ambulatory Visit: Payer: Self-pay | Admitting: Internal Medicine

## 2012-07-19 ENCOUNTER — Ambulatory Visit (INDEPENDENT_AMBULATORY_CARE_PROVIDER_SITE_OTHER): Payer: Medicare Other

## 2012-07-19 DIAGNOSIS — Z23 Encounter for immunization: Secondary | ICD-10-CM

## 2012-08-04 ENCOUNTER — Encounter: Payer: Self-pay | Admitting: Internal Medicine

## 2012-08-04 ENCOUNTER — Ambulatory Visit (INDEPENDENT_AMBULATORY_CARE_PROVIDER_SITE_OTHER): Payer: Medicare Other | Admitting: Internal Medicine

## 2012-08-04 VITALS — BP 148/98 | HR 80 | Temp 97.1°F | Resp 16 | Wt 122.0 lb

## 2012-08-04 DIAGNOSIS — Z1239 Encounter for other screening for malignant neoplasm of breast: Secondary | ICD-10-CM

## 2012-08-04 DIAGNOSIS — M2550 Pain in unspecified joint: Secondary | ICD-10-CM

## 2012-08-04 DIAGNOSIS — Z1231 Encounter for screening mammogram for malignant neoplasm of breast: Secondary | ICD-10-CM

## 2012-08-04 DIAGNOSIS — F411 Generalized anxiety disorder: Secondary | ICD-10-CM

## 2012-08-04 DIAGNOSIS — E119 Type 2 diabetes mellitus without complications: Secondary | ICD-10-CM

## 2012-08-04 DIAGNOSIS — M810 Age-related osteoporosis without current pathological fracture: Secondary | ICD-10-CM

## 2012-08-04 DIAGNOSIS — I10 Essential (primary) hypertension: Secondary | ICD-10-CM

## 2012-08-04 NOTE — Assessment & Plan Note (Signed)
Vit D 

## 2012-08-04 NOTE — Assessment & Plan Note (Signed)
  On diet  

## 2012-08-04 NOTE — Assessment & Plan Note (Signed)
Continue with current prescription therapy as reflected on the Med list.  

## 2012-08-04 NOTE — Progress Notes (Signed)
Subjective:    Patient ID: Vickie Wilson, female    DOB: 1931/03/03, 78 y.o.   MRN: [id number]  HPI  C/o stress w/a sick friend The patient presents for a follow-up of  chronic hypertension, chronic dyslipidemia, type 2 diabetes controlled with medicines  BP Readings from Last 3 Encounters:  08/04/12 148/98  03/28/12 150/90  02/23/12 152/80   Wt Readings from Last 3 Encounters:  08/04/12 122 lb (55.339 kg)  03/28/12 122 lb (55.339 kg)  02/23/12 124 lb (56.246 kg)        Review of Systems  Constitutional: Negative.  Negative for fever, chills, diaphoresis, activity change, appetite change, fatigue and unexpected weight change.  HENT: Negative for hearing loss, ear pain, nosebleeds, congestion, sore throat, facial swelling, rhinorrhea, sneezing, mouth sores, trouble swallowing, neck pain, neck stiffness, postnasal drip, sinus pressure and tinnitus.   Eyes: Negative for pain, discharge, redness, itching and visual disturbance.  Respiratory: Negative for cough, chest tightness, shortness of breath, wheezing and stridor.   Cardiovascular: Negative for chest pain, palpitations and leg swelling.  Gastrointestinal: Negative for nausea, abdominal pain, diarrhea, constipation, blood in stool, abdominal distention, anal bleeding and rectal pain.  Genitourinary: Negative for dysuria, urgency, frequency, hematuria, flank pain, vaginal bleeding, vaginal discharge, difficulty urinating, genital sores, vaginal pain and pelvic pain.  Musculoskeletal: Positive for back pain and arthralgias. Negative for joint swelling and gait problem.  Skin: Negative.  Negative for pallor and rash.  Neurological: Negative for dizziness, tremors, seizures, syncope, speech difficulty, weakness, numbness and headaches.  Hematological: Negative for adenopathy. Does not bruise/bleed easily.  Psychiatric/Behavioral: Negative for suicidal ideas, behavioral problems, confusion, sleep disturbance, dysphoric mood and  decreased concentration. The patient is not nervous/anxious.        Objective:   Physical Exam  Constitutional: She appears well-developed and well-nourished. No distress.  HENT:  Head: Normocephalic.  Right Ear: External ear normal.  Left Ear: External ear normal.  Nose: Nose normal.  Mouth/Throat: Oropharynx is clear and moist.  Eyes: Conjunctivae normal are normal. Pupils are equal, round, and reactive to light. Right eye exhibits no discharge. Left eye exhibits no discharge.  Neck: Normal range of motion. Neck supple. No JVD present. No tracheal deviation present. No thyromegaly present.  Cardiovascular: Normal rate, regular rhythm and normal heart sounds.   Pulmonary/Chest: No stridor. No respiratory distress. She has no wheezes.  Abdominal: Soft. Bowel sounds are normal. She exhibits no distension and no mass. There is no tenderness. There is no rebound and no guarding.  Musculoskeletal: She exhibits tenderness (over muscles). She exhibits no edema.  Lymphadenopathy:    She has no cervical adenopathy.  Neurological: She displays normal reflexes. No cranial nerve deficit. She exhibits normal muscle tone. Coordination normal.  Skin: No rash noted. No erythema.  Psychiatric: She has a normal mood and affect. Her behavior is normal. Judgment and thought content normal.    Lab Results  Component Value Date   WBC 5.6 04/29/2010   HGB 12.8 04/29/2010   HCT 37.8 04/29/2010   PLT 322.0 04/29/2010   GLUCOSE 93 07/05/2012   CHOL 195 07/05/2012   TRIG 57.0 07/05/2012   HDL 63.50 07/05/2012   LDLDIRECT 213.7 03/24/2012   LDLCALC 120* 07/05/2012   ALT 14 07/05/2012   AST 25 07/05/2012   NA 134* 07/05/2012   K 3.7 07/05/2012   CL 97 07/05/2012   CREATININE 1.1 07/05/2012   BUN 34* 07/05/2012   CO2 30 07/05/2012   TSH 2.08 04/21/2011  HGBA1C 6.4 03/24/2012         Assessment & Plan:

## 2012-08-18 ENCOUNTER — Other Ambulatory Visit: Payer: Self-pay | Admitting: Internal Medicine

## 2012-08-31 ENCOUNTER — Other Ambulatory Visit: Payer: Self-pay | Admitting: *Deleted

## 2012-08-31 MED ORDER — LOSARTAN POTASSIUM 100 MG PO TABS: 100.0000 mg | ORAL_TABLET | Freq: Every day | ORAL | Status: DC

## 2012-09-13 ENCOUNTER — Other Ambulatory Visit: Payer: Self-pay | Admitting: Internal Medicine

## 2012-10-05 ENCOUNTER — Ambulatory Visit: Payer: Medicare Other

## 2012-10-20 ENCOUNTER — Ambulatory Visit: Payer: Medicare Other | Admitting: Internal Medicine

## 2012-10-21 ENCOUNTER — Encounter: Payer: Self-pay | Admitting: Internal Medicine

## 2012-10-21 ENCOUNTER — Ambulatory Visit (INDEPENDENT_AMBULATORY_CARE_PROVIDER_SITE_OTHER): Payer: Medicare Other | Admitting: Internal Medicine

## 2012-10-21 VITALS — BP 150/90 | HR 80 | Temp 97.5°F | Resp 16 | Wt 124.0 lb

## 2012-10-21 DIAGNOSIS — F411 Generalized anxiety disorder: Secondary | ICD-10-CM

## 2012-10-21 DIAGNOSIS — F329 Major depressive disorder, single episode, unspecified: Secondary | ICD-10-CM

## 2012-10-21 DIAGNOSIS — I809 Phlebitis and thrombophlebitis of unspecified site: Secondary | ICD-10-CM | POA: Insufficient documentation

## 2012-10-21 DIAGNOSIS — B029 Zoster without complications: Secondary | ICD-10-CM

## 2012-10-21 DIAGNOSIS — E785 Hyperlipidemia, unspecified: Secondary | ICD-10-CM

## 2012-10-21 DIAGNOSIS — I10 Essential (primary) hypertension: Secondary | ICD-10-CM

## 2012-10-21 DIAGNOSIS — E119 Type 2 diabetes mellitus without complications: Secondary | ICD-10-CM

## 2012-10-21 MED ORDER — ACYCLOVIR 800 MG PO TABS: 800.0000 mg | ORAL_TABLET | Freq: Every day | ORAL | Status: DC

## 2012-10-21 MED ORDER — ASPIRIN 325 MG PO TABS: 325.0000 mg | ORAL_TABLET | Freq: Every day | ORAL | Status: DC

## 2012-10-21 NOTE — Progress Notes (Signed)
Subjective:    Patient ID: Vickie Wilson, female    DOB: 1931/07/08, 79 y.o.   MRN: [id number]  HPI  C/o place on leg The patient presents for a follow-up of  chronic hypertension, chronic dyslipidemia, type 2 diabetes controlled with medicines  BP Readings from Last 3 Encounters:  10/21/12 150/90  08/04/12 148/98  03/28/12 150/90   Wt Readings from Last 3 Encounters:  10/21/12 124 lb (56.246 kg)  08/04/12 122 lb (55.339 kg)  03/28/12 122 lb (55.339 kg)        Review of Systems  Constitutional: Negative.  Negative for fever, chills, diaphoresis, activity change, appetite change, fatigue and unexpected weight change.  HENT: Negative for hearing loss, ear pain, nosebleeds, congestion, sore throat, facial swelling, rhinorrhea, sneezing, mouth sores, trouble swallowing, neck pain, neck stiffness, postnasal drip, sinus pressure and tinnitus.   Eyes: Negative for pain, discharge, redness, itching and visual disturbance.  Respiratory: Negative for cough, chest tightness, shortness of breath, wheezing and stridor.   Cardiovascular: Negative for chest pain, palpitations and leg swelling.  Gastrointestinal: Negative for nausea, abdominal pain, diarrhea, constipation, blood in stool, abdominal distention, anal bleeding and rectal pain.  Genitourinary: Negative for dysuria, urgency, frequency, hematuria, flank pain, vaginal bleeding, vaginal discharge, difficulty urinating, genital sores, vaginal pain and pelvic pain.  Musculoskeletal: Positive for back pain and arthralgias. Negative for joint swelling and gait problem.  Skin: Negative.  Negative for pallor and rash.  Neurological: Negative for dizziness, tremors, seizures, syncope, speech difficulty, weakness, numbness and headaches.  Hematological: Negative for adenopathy. Does not bruise/bleed easily.  Psychiatric/Behavioral: Negative for suicidal ideas, behavioral problems, confusion, sleep disturbance, dysphoric mood and decreased  concentration. The patient is not nervous/anxious.        Objective:   Physical Exam  Constitutional: She appears well-developed and well-nourished. No distress.  HENT:  Head: Normocephalic.  Right Ear: External ear normal.  Left Ear: External ear normal.  Nose: Nose normal.  Mouth/Throat: Oropharynx is clear and moist.  Eyes: Conjunctivae normal are normal. Pupils are equal, round, and reactive to light. Right eye exhibits no discharge. Left eye exhibits no discharge.  Neck: Normal range of motion. Neck supple. No JVD present. No tracheal deviation present. No thyromegaly present.  Cardiovascular: Normal rate, regular rhythm and normal heart sounds.   Pulmonary/Chest: No stridor. No respiratory distress. She has no wheezes.  Abdominal: Soft. Bowel sounds are normal. She exhibits no distension and no mass. There is no tenderness. There is no rebound and no guarding.  Musculoskeletal: She exhibits tenderness (over muscles). She exhibits no edema.  Lymphadenopathy:    She has no cervical adenopathy.  Neurological: She displays normal reflexes. No cranial nerve deficit. She exhibits normal muscle tone. Coordination normal.  Skin: No rash noted. No erythema.  Psychiatric: She has a normal mood and affect. Her behavior is normal. Judgment and thought content normal.  A painful lump on L dist inner thigh - bluish  Vesic rash on R chest Lab Results  Component Value Date   WBC 5.6 04/29/2010   HGB 12.8 04/29/2010   HCT 37.8 04/29/2010   PLT 322.0 04/29/2010   GLUCOSE 93 07/05/2012   CHOL 195 07/05/2012   TRIG 57.0 07/05/2012   HDL 63.50 07/05/2012   LDLDIRECT 213.7 03/24/2012   LDLCALC 120* 07/05/2012   ALT 14 07/05/2012   AST 25 07/05/2012   NA 134* 07/05/2012   K 3.7 07/05/2012   CL 97 07/05/2012   CREATININE 1.1 07/05/2012   BUN  34* 07/05/2012   CO2 30 07/05/2012   TSH 2.08 04/21/2011   HGBA1C 6.4 03/24/2012    Procedure Note :    Procedure :   Point of care (POC) sonography  examination   Indication:   Equipment used: Sonosite M-Turbo with HFL38x/13-6 MHz transducer linear probe. The images were stored in the unit and later transferred in storage.  The patient was placed in a decubitus position.  This study revealed a heteroechoic  1.93x2.35 cm lesion in the L distal inner thigh   Impression: This study revealed a heteroechoic  1.93x2.35 cm lesion in the L distal inner thigh c/w superficial vein phlebitis        Assessment & Plan:

## 2012-10-21 NOTE — Assessment & Plan Note (Signed)
2/14 L dist thigh [place] POC Start ASA

## 2012-10-21 NOTE — Assessment & Plan Note (Signed)
Acyclovir 

## 2012-10-22 ENCOUNTER — Encounter: Payer: Self-pay | Admitting: Internal Medicine

## 2012-10-22 NOTE — Assessment & Plan Note (Signed)
Continue with current prescription therapy as reflected on the Med list.  

## 2012-10-22 NOTE — Assessment & Plan Note (Signed)
  On diet  

## 2012-10-24 ENCOUNTER — Telehealth: Payer: Self-pay | Admitting: Internal Medicine

## 2012-10-24 ENCOUNTER — Ambulatory Visit: Payer: Medicare Other | Admitting: Internal Medicine

## 2012-10-24 NOTE — Telephone Encounter (Signed)
Patient Information:  Caller Name: Nyima  Phone: [phone]  Patient: Vickie Wilson, Vickie Wilson  Gender: Female  DOB: 1931/05/21  Age: 79 Years  PCP: Plotnikov, Alex (Adults only)  Office Follow Up:  Does the office need to follow up with this patient?: Yes  Instructions For The Office: Patient wants to know if she can exercise with her superficial clot.  She forgot to ask when she was evaluated on 02/07.  RN Note:  Patient was seen on 10/21/12 regarding and noted to have a superficial blood clot in inner left distal thigh.  Patient states it is about the same but certainly no worse.  Has started ASA therapy.  She is calling to see if what follow up that may be done and if she could exercise.  RN explained that draining of a clot was not a typical procedure.  As far as exercise she would check with provider to see if he reccommended that at this time.  Symptoms  Reason For Call & Symptoms: Patient is calling about questions from earlier visit.  Reviewed Health History In EMR: Yes  Reviewed Medications In EMR: Yes  Reviewed Allergies In EMR: Yes  Reviewed Surgeries / Procedures: Yes  Date of Onset of Symptoms: Unknown  Guideline(s) Used:  No Protocol Available - Information Only  No Protocol Available - Sick Adult  Disposition Per Guideline:   Discuss with PCP and Callback by Nurse Today  Reason For Disposition Reached:   Nursing judgment  Advice Given:  Call Back If:  New symptoms develop  You become worse.

## 2012-10-24 NOTE — Telephone Encounter (Signed)
Pt informed of MD's advisement. 

## 2012-10-24 NOTE — Telephone Encounter (Signed)
Ok to exercise I can re-check in 2 wks Warm compress is ok Thx

## 2012-10-26 ENCOUNTER — Other Ambulatory Visit: Payer: Self-pay | Admitting: Internal Medicine

## 2012-11-09 ENCOUNTER — Other Ambulatory Visit: Payer: Self-pay | Admitting: Internal Medicine

## 2012-11-30 ENCOUNTER — Encounter: Payer: Medicare Other | Admitting: Internal Medicine

## 2012-12-21 ENCOUNTER — Encounter: Payer: Medicare Other | Admitting: Internal Medicine

## 2012-12-24 ENCOUNTER — Other Ambulatory Visit: Payer: Self-pay | Admitting: Internal Medicine

## 2012-12-30 ENCOUNTER — Telehealth: Payer: Self-pay | Admitting: Internal Medicine

## 2012-12-30 NOTE — Telephone Encounter (Signed)
Patient requested samples of Crestor 20mg .  She will call back for RX.  Had been given 40 mg but was having to cut these in half.  Samples given

## 2013-01-16 ENCOUNTER — Other Ambulatory Visit: Payer: Self-pay | Admitting: *Deleted

## 2013-01-16 ENCOUNTER — Ambulatory Visit (INDEPENDENT_AMBULATORY_CARE_PROVIDER_SITE_OTHER): Payer: Medicare Other

## 2013-01-16 DIAGNOSIS — Z0000 Encounter for general adult medical examination without abnormal findings: Secondary | ICD-10-CM

## 2013-01-16 DIAGNOSIS — E119 Type 2 diabetes mellitus without complications: Secondary | ICD-10-CM

## 2013-01-16 LAB — CBC WITH DIFFERENTIAL/PLATELET
Basophils Absolute: 0 10*3/uL (ref 0.0–0.1)
Basophils Relative: 0.4 % (ref 0.0–3.0)
Eosinophils Absolute: 0.1 10*3/uL (ref 0.0–0.7)
Eosinophils Relative: 2.2 % (ref 0.0–5.0)
HCT: 37.3 % (ref 36.0–46.0)
Hemoglobin: 12.7 g/dL (ref 12.0–15.0)
Lymphocytes Relative: 46.9 % — ABNORMAL HIGH (ref 12.0–46.0)
Lymphs Abs: 2.9 10*3/uL (ref 0.7–4.0)
MCHC: 34.2 g/dL (ref 30.0–36.0)
MCV: 84.4 fl (ref 78.0–100.0)
Monocytes Absolute: 0.6 10*3/uL (ref 0.1–1.0)
Monocytes Relative: 10.2 % (ref 3.0–12.0)
Neutro Abs: 2.5 10*3/uL (ref 1.4–7.7)
Neutrophils Relative %: 40.3 % — ABNORMAL LOW (ref 43.0–77.0)
Platelets: 333 10*3/uL (ref 150.0–400.0)
RBC: 4.42 Mil/uL (ref 3.87–5.11)
RDW: 15.1 % — ABNORMAL HIGH (ref 11.5–14.6)
WBC: 6.1 10*3/uL (ref 4.5–10.5)

## 2013-01-16 LAB — URINALYSIS, ROUTINE W REFLEX MICROSCOPIC
Bilirubin Urine: NEGATIVE
Ketones, ur: NEGATIVE
Nitrite: NEGATIVE
Specific Gravity, Urine: 1.01 (ref 1.000–1.030)
Total Protein, Urine: NEGATIVE
Urine Glucose: NEGATIVE
Urobilinogen, UA: 0.2 (ref 0.0–1.0)
pH: 7 (ref 5.0–8.0)

## 2013-01-17 LAB — HEPATIC FUNCTION PANEL
ALT: 17 U/L (ref 0–35)
AST: 28 U/L (ref 0–37)
Albumin: 4.1 g/dL (ref 3.5–5.2)
Alkaline Phosphatase: 42 U/L (ref 39–117)
Bilirubin, Direct: 0.1 mg/dL (ref 0.0–0.3)
Total Bilirubin: 0.7 mg/dL (ref 0.3–1.2)
Total Protein: 6.9 g/dL (ref 6.0–8.3)

## 2013-01-17 LAB — BASIC METABOLIC PANEL
BUN: 20 mg/dL (ref 6–23)
CO2: 29 mEq/L (ref 19–32)
Calcium: 9.2 mg/dL (ref 8.4–10.5)
Chloride: 98 mEq/L (ref 96–112)
Creatinine, Ser: 1.2 mg/dL (ref 0.4–1.2)
GFR: 48.26 mL/min — ABNORMAL LOW (ref >60.00)
Glucose, Bld: 86 mg/dL (ref 70–99)
Potassium: 4 mEq/L (ref 3.5–5.1)
Sodium: 135 mEq/L (ref 135–145)

## 2013-01-17 LAB — LIPID PANEL
Cholesterol: 207 mg/dL — ABNORMAL HIGH (ref 0–200)
HDL: 66.2 mg/dL (ref >39.00)
Total CHOL/HDL Ratio: 3
Triglycerides: 89 mg/dL (ref 0.0–149.0)
VLDL: 17.8 mg/dL (ref 0.0–40.0)

## 2013-01-17 LAB — LDL CHOLESTEROL, DIRECT: Direct LDL: 125.6 mg/dL

## 2013-01-17 LAB — TSH: TSH: 3.23 u[IU]/mL (ref 0.35–5.50)

## 2013-01-18 ENCOUNTER — Encounter: Payer: Self-pay | Admitting: Internal Medicine

## 2013-01-18 ENCOUNTER — Ambulatory Visit (INDEPENDENT_AMBULATORY_CARE_PROVIDER_SITE_OTHER): Payer: Medicare Other | Admitting: Internal Medicine

## 2013-01-18 VITALS — BP 160/92 | HR 72 | Temp 98.4°F | Resp 16 | Wt 123.0 lb

## 2013-01-18 DIAGNOSIS — F3289 Other specified depressive episodes: Secondary | ICD-10-CM

## 2013-01-18 DIAGNOSIS — F329 Major depressive disorder, single episode, unspecified: Secondary | ICD-10-CM

## 2013-01-18 DIAGNOSIS — I10 Essential (primary) hypertension: Secondary | ICD-10-CM

## 2013-01-18 DIAGNOSIS — E119 Type 2 diabetes mellitus without complications: Secondary | ICD-10-CM

## 2013-01-18 DIAGNOSIS — R9431 Abnormal electrocardiogram [ECG] [EKG]: Secondary | ICD-10-CM

## 2013-01-18 DIAGNOSIS — M2550 Pain in unspecified joint: Secondary | ICD-10-CM

## 2013-01-18 DIAGNOSIS — Z0000 Encounter for general adult medical examination without abnormal findings: Secondary | ICD-10-CM

## 2013-01-18 DIAGNOSIS — R011 Cardiac murmur, unspecified: Secondary | ICD-10-CM

## 2013-01-18 DIAGNOSIS — B029 Zoster without complications: Secondary | ICD-10-CM

## 2013-01-18 DIAGNOSIS — Z136 Encounter for screening for cardiovascular disorders: Secondary | ICD-10-CM

## 2013-01-18 NOTE — Addendum Note (Signed)
Addended by: Tresa Garter on: 01/18/2013 02:37 PM   Modules accepted: Level of Service

## 2013-01-18 NOTE — Assessment & Plan Note (Addendum)
Here for medicare wellness/physical  Diet: heart healthy  Physical activity: not sedentary  Depression/mood screen: negative  Hearing: intact to whispered voice  Visual acuity: grossly normal, performs annual eye exam  ADLs: capable  Fall risk: none  Home safety: good  Cognitive evaluation: intact to orientation, naming, recall and repetition  EOL planning: adv directives, full code/ I agree  I have personally reviewed and have noted  1. The patient's medical and social history  2. Their use of alcohol, tobacco or illicit drugs  3. Their current medications and supplements  4. The patient's functional ability including ADL's, fall risks, home safety risks and hearing or visual impairment.  5. Diet and physical activities - good 6. Evidence for depression or mood disorders    Today patient counseled on age appropriate routine health concerns for screening and prevention, each reviewed and up to date or declined. Immunizations reviewed and up to date or declined. Labs ordered and reviewed. Risk factors for depression reviewed and negative. Hearing function and visual acuity are intact. ADLs screened and addressed as needed. Functional ability and level of safety reviewed and appropriate. Education, counseling and referrals performed based on assessed risks today. Patient provided with a copy of personalized plan for preventive services.   Declined mammo, BDS

## 2013-01-18 NOTE — Assessment & Plan Note (Signed)
Vaccination adviced

## 2013-01-18 NOTE — Assessment & Plan Note (Signed)
Continue with current prescription therapy as reflected on the Med list. BP is ok at home 

## 2013-01-18 NOTE — Assessment & Plan Note (Signed)
Nl CBGs

## 2013-01-18 NOTE — Addendum Note (Signed)
Addended by: Tresa Garter on: 01/18/2013 05:49 PM   Modules accepted: Level of Service

## 2013-01-18 NOTE — Assessment & Plan Note (Signed)
Hold Crestor x 2 wks if needed

## 2013-01-18 NOTE — Progress Notes (Addendum)
Subjective:    HPI  The patient is here for a wellness exam. The patient has been doing well overall without major physical or psychological issues going on lately.The patient presents for a follow-up of  chronic hypertension, chronic dyslipidemia, anxiety controlled with medicines  BP Readings from Last 3 Encounters:  01/18/13 160/92  10/21/12 150/90  08/04/12 148/98   Wt Readings from Last 3 Encounters:  01/18/13 123 lb (55.792 kg)  10/21/12 124 lb (56.246 kg)  08/04/12 122 lb (55.339 kg)        Review of Systems  Constitutional: Negative.  Negative for fever, chills, diaphoresis, activity change, appetite change, fatigue and unexpected weight change.  HENT: Negative for hearing loss, ear pain, nosebleeds, congestion, sore throat, facial swelling, rhinorrhea, sneezing, mouth sores, trouble swallowing, neck pain, neck stiffness, postnasal drip, sinus pressure and tinnitus.   Eyes: Negative for pain, discharge, redness, itching and visual disturbance.  Respiratory: Negative for cough, chest tightness, shortness of breath, wheezing and stridor.   Cardiovascular: Negative for chest pain, palpitations and leg swelling.  Gastrointestinal: Negative for nausea, abdominal pain, diarrhea, constipation, blood in stool, abdominal distention, anal bleeding and rectal pain.  Genitourinary: Negative for dysuria, urgency, frequency, hematuria, flank pain, vaginal bleeding, vaginal discharge, difficulty urinating, genital sores, vaginal pain and pelvic pain.  Musculoskeletal: Positive for back pain and arthralgias. Negative for joint swelling and gait problem.  Skin: Negative.  Negative for pallor and rash.  Neurological: Negative for dizziness, tremors, seizures, syncope, speech difficulty, weakness, numbness and headaches.  Hematological: Negative for adenopathy. Does not bruise/bleed easily.  Psychiatric/Behavioral: Negative for suicidal ideas, behavioral problems, confusion, sleep  disturbance, dysphoric mood and decreased concentration. The patient is not nervous/anxious.        Objective:   Physical Exam  Constitutional: She appears well-developed and well-nourished. No distress.  HENT:  Head: Normocephalic.  Right Ear: External ear normal.  Left Ear: External ear normal.  Nose: Nose normal.  Mouth/Throat: Oropharynx is clear and moist.  Eyes: Conjunctivae are normal. Pupils are equal, round, and reactive to light. Right eye exhibits no discharge. Left eye exhibits no discharge.  Neck: Normal range of motion. Neck supple. No JVD present. No tracheal deviation present. No thyromegaly present.  Cardiovascular: Normal rate, regular rhythm and normal heart sounds.   Pulmonary/Chest: No stridor. No respiratory distress. She has no wheezes.  Abdominal: Soft. Bowel sounds are normal. She exhibits no distension and no mass. There is no tenderness. There is no rebound and no guarding.  Musculoskeletal: She exhibits tenderness (over muscles). She exhibits no edema.  Lymphadenopathy:    She has no cervical adenopathy.  Neurological: She displays normal reflexes. No cranial nerve deficit. She exhibits normal muscle tone. Coordination normal.  Skin: No rash noted. No erythema.  Psychiatric: She has a normal mood and affect. Her behavior is normal. Judgment and thought content normal.  1/6 murmur   Lab Results  Component Value Date   WBC 6.1 01/16/2013   HGB 12.7 01/16/2013   HCT 37.3 01/16/2013   PLT 333.0 01/16/2013   GLUCOSE 86 01/16/2013   CHOL 207* 01/16/2013   TRIG 89.0 01/16/2013   HDL 66.20 01/16/2013   LDLDIRECT 125.6 01/16/2013   LDLCALC 120* 07/05/2012   ALT 17 01/16/2013   AST 28 01/16/2013   NA 135 01/16/2013   K 4.0 01/16/2013   CL 98 01/16/2013   CREATININE 1.2 01/16/2013   BUN 20 01/16/2013   CO2 29 01/16/2013   TSH 3.23 01/16/2013  HGBA1C 6.4 03/24/2012    EKG    Assessment & Plan:

## 2013-01-18 NOTE — Addendum Note (Signed)
Addended by: Merrilyn Puma on: 01/18/2013 02:39 PM   Modules accepted: Orders

## 2013-01-18 NOTE — Addendum Note (Signed)
Addended by: Tresa Garter on: 01/18/2013 03:07 PM   Modules accepted: Orders

## 2013-01-18 NOTE — Assessment & Plan Note (Signed)
ECHO

## 2013-01-18 NOTE — Assessment & Plan Note (Signed)
Continue with current prescription therapy as reflected on the Med list.  

## 2013-01-25 ENCOUNTER — Other Ambulatory Visit (HOSPITAL_COMMUNITY): Payer: Medicare Other

## 2013-02-02 ENCOUNTER — Ambulatory Visit (INDEPENDENT_AMBULATORY_CARE_PROVIDER_SITE_OTHER): Payer: Medicare Other | Admitting: Cardiology

## 2013-02-02 ENCOUNTER — Encounter: Payer: Self-pay | Admitting: Cardiology

## 2013-02-02 ENCOUNTER — Ambulatory Visit (HOSPITAL_COMMUNITY): Payer: Medicare Other | Attending: Cardiology | Admitting: Radiology

## 2013-02-02 VITALS — BP 180/100 | HR 68 | Ht 61.0 in | Wt 120.0 lb

## 2013-02-02 DIAGNOSIS — E119 Type 2 diabetes mellitus without complications: Secondary | ICD-10-CM | POA: Insufficient documentation

## 2013-02-02 DIAGNOSIS — I059 Rheumatic mitral valve disease, unspecified: Secondary | ICD-10-CM | POA: Insufficient documentation

## 2013-02-02 DIAGNOSIS — R9431 Abnormal electrocardiogram [ECG] [EKG]: Secondary | ICD-10-CM

## 2013-02-02 DIAGNOSIS — E785 Hyperlipidemia, unspecified: Secondary | ICD-10-CM | POA: Insufficient documentation

## 2013-02-02 DIAGNOSIS — I10 Essential (primary) hypertension: Secondary | ICD-10-CM | POA: Insufficient documentation

## 2013-02-02 DIAGNOSIS — R222 Localized swelling, mass and lump, trunk: Secondary | ICD-10-CM

## 2013-02-02 DIAGNOSIS — I5189 Other ill-defined heart diseases: Secondary | ICD-10-CM

## 2013-02-02 DIAGNOSIS — I079 Rheumatic tricuspid valve disease, unspecified: Secondary | ICD-10-CM | POA: Insufficient documentation

## 2013-02-02 DIAGNOSIS — R011 Cardiac murmur, unspecified: Secondary | ICD-10-CM

## 2013-02-02 NOTE — Patient Instructions (Addendum)
We will schedule you for a cardiac MRI  Start Xarelto 15 mg twice a day (blood thinner) for 3 weeks then 20 mg daily  Stop ASA.

## 2013-02-02 NOTE — Progress Notes (Signed)
Echocardiogram performed.  

## 2013-02-02 NOTE — Progress Notes (Signed)
[name] Date of Birth: 07-08-1931 Medical Record #[id number]  History of Present Illness: Vickie Wilson is seen at the request of Dr. Posey Rea as a work in today following an abnormal  Echocardiogram. She is a very pleasant 79 year old white female who has been in excellent health. She does have a history of hypertension and hyperlipidemia. She had a recent complete physical and ECG at that time showed a question of an old anterior infarct. For this reason an echocardiogram was obtained. Her echo today shows normal LV function. However there was an incidental finding of a right atrial mass. This measures approximately 2 cm. It appears solid, rounded and is mobile. Patient denies any symptoms of chest pain or shortness of breath. She's had no edema. She has no history of DVT or pulmonary emboli. She has no prior history of CVA. No clotting disorders. Her weight has been stable. She denies any fever or chills.  Current Outpatient Prescriptions on File Prior to Visit  Medication Sig Dispense Refill  . amLODipine (NORVASC) 5 MG tablet TAKE 1 TABLET ONCE DAILY.  30 tablet  6  . aspirin (BAYER ASPIRIN) 325 MG tablet Take 1 tablet (325 mg total) by mouth daily.  100 tablet  3  . atenolol (TENORMIN) 100 MG tablet TAKE (1/2) TABLET TWICE DAILY.  30 tablet  4  . cholecalciferol (VITAMIN D) 1000 UNITS tablet Take 1,000 Units by mouth daily.      Marland Kitchen losartan (COZAAR) 100 MG tablet Take 1 tablet (100 mg total) by mouth daily.  90 tablet  2  . Multiple Vitamins-Minerals (CENTRUM SILVER) CHEW Chew 1 each by mouth daily.        Marland Kitchen PARoxetine (PAXIL) 20 MG tablet TAKE 1 TABLET EACH DAY.  30 tablet  5  . triamterene-hydrochlorothiazide (MAXZIDE) 75-50 MG per tablet TAKE (1/2) TABLET DAILY IN THE MORNING.  15 tablet  5   No current facility-administered medications on file prior to visit.    Allergies  Allergen Reactions  . Captopril     REACTION: cough    Past Medical History  Diagnosis Date  . Anxiety    . Depression   . Hypertension   . Allergy   . Osteoporosis   . Constipation   . Diabetes mellitus   . Hyperlipidemia     Past Surgical History  Procedure Laterality Date  . Dilation and curettage of uterus  1987    History  Smoking status  . Former Smoker  Smokeless tobacco  . Not on file    History  Alcohol Use No    Family History  Problem Relation Age of Onset  . Hyperlipidemia Other   . Hypertension Other   . Stroke Other   . Heart disease Mother   . Alcohol abuse Father   . Heart disease Father     Review of Systems: As noted in history of present illness. She does not monitor her blood pressure at home. In general she has been in excellent health and has no complaints. She does complain of chronic constipation. All other systems were reviewed and are negative.  Physical Exam: BP 180/100  Pulse 68  Ht 5\' 1"  (1.549 m)  Wt 120 lb (54.432 kg)  BMI 22.69 kg/m2 She is a pleasant white female who appears much younger than her stated age. HEENT: Normocephalic, atraumatic. Pupils are equal round and reactive to light accommodation. Sclera clear. Oropharynx is clear. Neck is supple without JVD, adenopathy, thyromegaly, or bruits. Lungs: Clear Cardiovascular: Regular  rate and rhythm without gallop, murmur, or click. Abdomen: Soft and nontender. No masses or bruits. Bowel sounds are positive. No hepatosplenomegaly Extremities: No cyanosis, clubbing, or edema. Mild superficial varicosities. Pulses are 2+ and symmetric. Skin: Warm and dry Neuro: Alert and oriented x3. Cranial nerves II through XII are intact.  LABORATORY DATA: Lab Results  Component Value Date   WBC 6.1 01/16/2013   HGB 12.7 01/16/2013   HCT 37.3 01/16/2013   PLT 333.0 01/16/2013   GLUCOSE 86 01/16/2013   CHOL 207* 01/16/2013   TRIG 89.0 01/16/2013   HDL 66.20 01/16/2013   LDLDIRECT 125.6 01/16/2013   LDLCALC 120* 07/05/2012   ALT 17 01/16/2013   AST 28 01/16/2013   NA 135 01/16/2013   K 4.0 01/16/2013   CL 98  01/16/2013   CREATININE 1.2 01/16/2013   BUN 20 01/16/2013   CO2 29 01/16/2013   TSH 3.23 01/16/2013   HGBA1C 6.4 03/24/2012   ECG demonstrates normal sinus rhythm with occasional PVC. There is poor R wave progression in V1 through V4.  Assessment / Plan: 1. Right atrial mass. I suspect this is a tumor. This may be benign or malignant. Other potential etiology would be a thrombus. Patient is asymptomatic. This was an incidental finding on echo.  2. Hypertension.  3. Hyperlipidemia.  Plan: We'll obtain cardiac MRI for further evaluation of her atrial mass. I recommended anticoagulation in the interim to cover her for potential thrombus. We will start her on Xarelto 15 mg twice a day. I recommended stopping her aspirin. Further management pending the results of MRI. While TEE might add some additional information I don't think it will add a whole lot to tissue delineation.

## 2013-02-07 ENCOUNTER — Encounter: Payer: Self-pay | Admitting: Cardiology

## 2013-02-13 ENCOUNTER — Ambulatory Visit (HOSPITAL_COMMUNITY)
Admission: RE | Admit: 2013-02-13 | Discharge: 2013-02-13 | Disposition: A | Discharge status: Home / Self Care | Payer: Medicare Other | Source: Ambulatory Visit | Attending: Cardiology | Admitting: Cardiology

## 2013-02-13 DIAGNOSIS — I5189 Other ill-defined heart diseases: Secondary | ICD-10-CM

## 2013-02-13 DIAGNOSIS — I10 Essential (primary) hypertension: Secondary | ICD-10-CM

## 2013-02-13 DIAGNOSIS — T50995A Adverse effect of other drugs, medicaments and biological substances, initial encounter: Secondary | ICD-10-CM | POA: Insufficient documentation

## 2013-02-13 DIAGNOSIS — R110 Nausea: Secondary | ICD-10-CM | POA: Insufficient documentation

## 2013-02-13 MED ORDER — GADOBENATE DIMEGLUMINE 529 MG/ML IV SOLN
20.0000 mL | Freq: Once | INTRAVENOUS | Status: AC
  Administered 2013-02-13: 20 mL via INTRAVENOUS

## 2013-02-17 ENCOUNTER — Telehealth: Payer: Self-pay

## 2013-02-17 ENCOUNTER — Telehealth: Payer: Self-pay | Admitting: Cardiology

## 2013-02-17 NOTE — Telephone Encounter (Signed)
**Note De-Identified  Obfuscation** Pt is advised that Dr. [place] has not reviewed MR results as he has been on vacation this week. She is advised that if we have not contacted her by late Monday or Tuesday to call us back, she verbalized understanding.

## 2013-02-17 NOTE — Telephone Encounter (Signed)
Pt.notified

## 2013-02-17 NOTE — Telephone Encounter (Signed)
Phone call from pt requesting her MRI results. MRI was on Monday. She says she has been constipated for weeks. Please advise.

## 2013-02-17 NOTE — Telephone Encounter (Signed)
I do not have the results. It would be best to see Dr [place] for a f/u to discuss the results Thx

## 2013-02-17 NOTE — Telephone Encounter (Signed)
New problem ° ° ° °Pt calling for results °

## 2013-02-21 NOTE — Telephone Encounter (Signed)
Follow Up     Pt calling in following up on results. Please call.

## 2013-02-22 NOTE — Telephone Encounter (Signed)
Spoke to patient 02/21/13 MRI results given.Referral form faxed to Dr.Owens office.Appointment scheduled with Dr.Owen 03/13/13 at 11:30 AM.

## 2013-03-13 ENCOUNTER — Telehealth: Payer: Self-pay

## 2013-03-13 ENCOUNTER — Encounter: Payer: Self-pay | Admitting: *Deleted

## 2013-03-13 ENCOUNTER — Institutional Professional Consult (permissible substitution) (INDEPENDENT_AMBULATORY_CARE_PROVIDER_SITE_OTHER): Payer: Medicare Other | Admitting: Thoracic Surgery (Cardiothoracic Vascular Surgery)

## 2013-03-13 ENCOUNTER — Encounter: Payer: Self-pay | Admitting: Thoracic Surgery (Cardiothoracic Vascular Surgery)

## 2013-03-13 VITALS — BP 146/81 | HR 58 | Resp 16 | Ht 60.0 in | Wt 120.0 lb

## 2013-03-13 DIAGNOSIS — I071 Rheumatic tricuspid insufficiency: Secondary | ICD-10-CM

## 2013-03-13 DIAGNOSIS — I5189 Other ill-defined heart diseases: Secondary | ICD-10-CM | POA: Insufficient documentation

## 2013-03-13 DIAGNOSIS — R222 Localized swelling, mass and lump, trunk: Secondary | ICD-10-CM

## 2013-03-13 DIAGNOSIS — I079 Rheumatic tricuspid valve disease, unspecified: Secondary | ICD-10-CM

## 2013-03-13 NOTE — Progress Notes (Signed)
[address]       [place] [address]             [phone]     CARDIOTHORACIC SURGERY CONSULTATION REPORT  Referring Provider is [place], Peter M, MD PCP is Sonda Primes, MD  Chief Complaint  Patient presents with  . Atrial Mass    eval and treat...ECHO...MRI    HPI:  Patient is an 79 year old divorced white female from [place] with no previous cardiac history but risk factors notable for history of hypertension and hyperlipidemia. The patient recently underwent complete physical exam with electrocardiogram as part of routine wellness check up by Dr. Posey Rea.  Electrocardiogram raised the question of possible old myocardial infarction which prompted ordering a routine transthoracic echocardiogram. Echocardiogram performed may 22nd 2014 revealed normal left ventricular size and systolic function but identified the presence of a 2 cm mass in the right atrium it appeared somewhat mobile and suggestive of an atrial myxoma. The patient was seen in consultation by Dr. Swaziland and an MRI was obtained. This confirmed the presence of a mass which appeared to be adherent to the lateral wall of the right atrium. There were no other complicating features and specifically no sign to suggest the presence of metastatic tumor or thrombus.  The patient was referred for elective surgical consultation.  The patient specifically denies any symptoms of chest pain or chest tightness either with activity or at rest. She remains physically active for her age and she reports no significant exertional shortness of breath. She has occasional palpitations. She has occasional mild dizzy spells that typically occur when she first stands up after sitting for prolonged period time. She's never had any syncopal episodes nor any documented arrhythmias.  Past Medical History  Diagnosis Date  . Anxiety   . Depression   . Hypertension   . Allergy   . Osteoporosis   . Constipation   .  Diabetes mellitus   . Hyperlipidemia     Past Surgical History  Procedure Laterality Date  . Dilation and curettage of uterus  1987    Family History  Problem Relation Age of Onset  . Hyperlipidemia Other   . Hypertension Other   . Stroke Other   . Heart disease Mother   . Alcohol abuse Father   . Heart disease Father     History   Social History  . Marital Status: Single    Spouse Name: N/A    Number of Children: 1  . Years of Education: N/A   Occupational History  . retired    Social History Main Topics  . Smoking status: Former Smoker -- 1.00 packs/day for 20 years    Quit date: 03/13/1998  . Smokeless tobacco: Never Used  . Alcohol Use: No  . Drug Use: No  . Sexually Active: Not Currently   Other Topics Concern  . Not on file   Social History Narrative   Regular exercise-yes      Daily caffeine use    Current Outpatient Prescriptions  Medication Sig Dispense Refill  . amLODipine (NORVASC) 5 MG tablet TAKE 1 TABLET ONCE DAILY.  30 tablet  6  . atenolol (TENORMIN) 100 MG tablet TAKE (1/2) TABLET TWICE DAILY.  30 tablet  4  . losartan (COZAAR) 100 MG tablet Take 1 tablet (100 mg total) by mouth daily.  90 tablet  2  . Multiple Vitamins-Minerals (CENTRUM SILVER) CHEW Chew 1 each by mouth daily.        Marland Kitchen  PARoxetine (PAXIL) 10 MG tablet Take 10 mg by mouth every morning.      . rosuvastatin (CRESTOR) 10 MG tablet Take 10 mg by mouth daily.      Marland Kitchen triamterene-hydrochlorothiazide (MAXZIDE) 75-50 MG per tablet TAKE (1/2) TABLET DAILY IN THE MORNING.  15 tablet  5  . cholecalciferol (VITAMIN D) 1000 UNITS tablet Take 1,000 Units by mouth daily.      . Rivaroxaban (XARELTO) 20 MG TABS Take by mouth daily.       No current facility-administered medications for this visit.    Allergies  Allergen Reactions  . Captopril     REACTION: cough      Review of Systems:   General:  normal appetite, normal energy, no weight gain, no weight loss, no  fever  Cardiac:  no chest pain with exertion, no chest pain at rest, no SOB with exertion, no resting SOB, no PND, no orthopnea, occasional palpitations, no arrhythmia, no atrial fibrillation, no LE edema, occasional dizzy spells, no syncope  Respiratory:  no shortness of breath, no home oxygen, no productive cough, no dry cough, no bronchitis, no wheezing, no hemoptysis, no asthma, no pain with inspiration or cough, no sleep apnea, no CPAP at night  GI:   no difficulty swallowing, no reflux, no frequent heartburn, no hiatal hernia, no abdominal pain, + constipation, no diarrhea, no hematochezia, no hematemesis, no melena  GU:   no dysuria,  no frequency, no urinary tract infection, no hematuria, no kidney stones, no kidney disease  Vascular:  no pain suggestive of claudication, no pain in feet, no leg cramps, no varicose veins, no DVT, no non-healing foot ulcer  Neuro:   no stroke, no TIA's, no seizures, no headaches, no temporary blindness one eye,  no slurred speech, no peripheral neuropathy, no chronic pain, no instability of gait, no memory/cognitive dysfunction  Musculoskeletal: + arthritis particularly in lower back, no joint swelling, no myalgias, no difficulty walking, normal mobility   Skin:   no rash, + itching, no skin infections, no pressure sores or ulcerations  Psych:   + anxiety, no depression, no nervousness, no unusual recent stress  Eyes:   no blurry vision, no floaters, no recent vision changes, does not wear glasses or contacts  ENT:   no hearing loss, no loose or painful teeth, no dentures, last saw dentist several years ago  Hematologic:  no easy bruising, no abnormal bleeding, no clotting disorder, no frequent epistaxis  Endocrine:  no diabetes, does not check CBG's at home     Physical Exam:   BP 146/81  Pulse 58  Resp 16  Ht 5' (1.524 m)  Wt 120 lb (54.432 kg)  BMI 23.44 kg/m2  SpO2 96%  General:    well-appearing  HEENT:  Unremarkable   Neck:   no JVD, no bruits,  no adenopathy   Chest:   clear to auscultation, symmetrical breath sounds, no wheezes, no rhonchi   CV:   RRR, no murmur   Abdomen:  soft, non-tender, no masses   Extremities:  warm, well-perfused, pulses mildly diminished, no LE edema  Rectal/GU  Deferred  Neuro:   Grossly non-focal and symmetrical throughout  Skin:   Clean and dry, no rashes, no breakdown   Diagnostic Tests:  Transthoracic Echocardiography  Patient: Vickie Wilson, Vickie Wilson MR #: [id number] Study Date: 02/02/2013 Gender: F Age: 60 Height: 152.4cm Weight: 55.8kg BSA: 1.38m^2 Pt. Status: Room:  SONOGRAPHER Luvenia Redden, RDCS ORDERING Plotnikov, Georgina Quint REFERRING Plotnikov, Georgina Quint ATTENDING  Shirlee Latch, [place] PERFORMING [place] cc:  ------------------------------------------------------------ LV EF: 55% - 60%  ------------------------------------------------------------ Indications: Abnormal EKG 794.31. Murmur 785.2.  ------------------------------------------------------------ History: PMH: Acquired from the patient and from the patient's chart. Murmur. Risk factors: Hypertension. Diabetes mellitus. Dyslipidemia.  ------------------------------------------------------------ Study Conclusions  - Left ventricle: The cavity size was normal. Wall thickness was normal. Systolic function was normal. The estimated ejection fraction was in the range of 55% to 60%. Wall motion was normal; there were no regional wall motion abnormalities. Doppler parameters are consistent with abnormal left ventricular relaxation (grade 1 diastolic dysfunction). - Tricuspid valve: Mild-moderate regurgitation. - Pulmonary arteries: Systolic pressure was mildly to moderately increased. PA peak pressure: 44mm Hg (S). Impressions:  - There is a well rounded mass in right atrium that appears to be attached to atrial wall (approximately 2x2 cn); ? myxoma; suggest cardiology evaluation as mass will most likely need to be  resected. Study interpreted 5:40 PM 02/02/13. Results messaged to Dr Posey Rea via EPIC.  ------------------------------------------------------------ Labs, prior tests, procedures, and surgery: ECG. Abnormal. Transthoracic echocardiography. M-mode, complete 2D, spectral Doppler, and color Doppler. Height: Height: 152.4cm. Height: 60in. Weight: Weight: 55.8kg. Weight: 122.7lb. Body mass index: BMI: 24kg/m^2. Body surface area: BSA: 1.83m^2. Blood pressure: 160/92. Patient status: Outpatient. Location: [facility]  ------------------------------------------------------------  ------------------------------------------------------------ Left ventricle: The cavity size was normal. Wall thickness was normal. Systolic function was normal. The estimated ejection fraction was in the range of 55% to 60%. Wall motion was normal; there were no regional wall motion abnormalities. Doppler parameters are consistent with abnormal left ventricular relaxation (grade 1 diastolic dysfunction).  ------------------------------------------------------------ Aortic valve: Trileaflet; normal thickness leaflets. Mobility was not restricted. Doppler: Transvalvular velocity was within the normal range. There was no stenosis. No regurgitation.  ------------------------------------------------------------ Aorta: Aortic root: The aortic root was normal in size.  ------------------------------------------------------------ Mitral valve: Structurally normal valve. Mobility was not restricted. Doppler: Transvalvular velocity was within the normal range. There was no evidence for stenosis. Trivial regurgitation.  ------------------------------------------------------------ Left atrium: The atrium was normal in size.  ------------------------------------------------------------ Right ventricle: The cavity size was normal. Systolic function was  normal.  ------------------------------------------------------------ Pulmonic valve: Doppler: Transvalvular velocity was within the normal range. There was no evidence for stenosis.  ------------------------------------------------------------ Tricuspid valve: Structurally normal valve. Doppler: Transvalvular velocity was within the normal range. Mild-moderate regurgitation.  ------------------------------------------------------------ Pulmonary artery: Systolic pressure was mildly to moderately increased.  ------------------------------------------------------------ Right atrium: The atrium was normal in size.  ------------------------------------------------------------ Pericardium: There was no pericardial effusion.  ------------------------------------------------------------ Systemic veins: Inferior vena cava: The vessel was normal in size.  ------------------------------------------------------------  2D measurements Normal Doppler measurements Norma Left ventricle l LVID ED, 41.6 mm 43-52 Main pulmonary artery chord, Pressure 44 mm Hg =30 PLAX , S LVID ES, 22.4 mm 23-38 Left ventricle chord, Ea, lat 7.6 cm/s ----- PLAX ann, 8 FS, chord, 46 % >29 tiss DP PLAX Ea, med 6.8 cm/s ----- LVPW, ED 8.45 mm ------ ann, IVS/LVPW 0.82 <1.3 tiss DP ratio, ED LVOT Ventricular septum Peak 77. cm/s ----- IVS, ED 6.9 mm ------ vel, S 5 LVOT VTI, S 21. cm ----- Diam, S 20 mm ------ 4 Area 3.14 cm^2 ------ HR 57 bpm ----- Diam 20 mm ------ Stroke 67. ml ----- Aorta vol 2 Root diam, 28 mm ------ Cardiac 3.8 L/min ----- ED output Left atrium Cardiac 2.5 L/(min-m^2 ----- AP dim 37 mm ------ index ) AP dim 2.43 cm/m^2 <2.2 Stroke 44. ml/m^2 ----- index index 2 Tricuspid valve Regurg 312  cm/s ----- peak vel Peak 39 mm Hg ----- RV-RA gradient , S Systemic veins Estimate 5 mm Hg ----- d CVP Right ventricle Pressure 44 mm Hg <30 , S Sa vel, 9.6 cm/s ----- lat ann,  5 tiss DP  ------------------------------------------------------------ Prepared and Electronically Authenticated by  Olga Millers 2014-05-22T17:[id number]   Cardiac MRI:  Indication RA Mass  The patient was scanned on a 1.5 Tesla GE magnet A dedicated  cardiac coil was used. Functional imaging was done using Fiesta  sequences. The patient received 20 cc of multihance. Unfortunately  she became nauseated and delayed IIR images were suboptimal. The  tech did not do any T2 weighted images  Findings:  The LA and LV were normal. Quantitative EF was 69% ( EDV 126, ESV  39 SV 86 )  There was no ASD or VSD. The aortic tricuspic and mitral vavles  appeared normal The was no evidence of SBE. The LAA was free of  thrombus. The IVC was normal  The RV had normal size and function. The proximal MPA and right  and left branches were normal  Thee was an ill defined mass in the lower aspect of the lateral RA  wall. It measures 1.9 by 1.5 cm. The tissue characteristics of  the central area were different than the periphery suggesting  possible necrosis. There was no appreciable gadolinium uptake  although the study was performed outside the ideal time window for  imaging  Impression:  1) RA mass not well characterized by MRI due to lack of T2  weighted images and patient not tolerating gadolinium Fiesta  measurement 1.9 x 1.5 cm.  2) Normal LV EF 69%  3) Normal RV  4) No LAA thrombus  5) Normal IVC  6) Normal PA and ascending aorta  7) Normal TV, AV and MV with no signs of SBE  Review of TTE shows lesion much better. Can consider TEE if  clinically indicated. Agree with anticoagulation for intracardiac  mass this size given risk of PE  Charlton Haws MD [facility]  Original Report Authenticated By: Charlton Haws, M.D.    Impression:  Patient has a 2 cm mass in the right atrium that appears to be attached to the lateral right atrial wall and is quite mobile with physical appearance  suggestive of an atrial myxoma. Other possibilities include papillary fibroblastoma, cardiac sarcoma, thrombus, or malignancy.  The patient is otherwise healthy and remains physically quite active for her age, although she does have history of hypertension and hypercholesterolemia. There is no obvious sign of the intra-atrial communication on transthoracic echocardiogram, although a bubble study was not performed. In the absence of a patent foramen ovale the primary indications for surgical resection remained definitive diagnosis and prevention of pulmonary embolus. Transthoracic echocardiogram does also demonstrate the presence of moderate tricuspid regurgitation.   Plan:  I discussed options at length with the patient and her daughter in the office today. We specifically discussed the indications for surgical resection.  I suspect the patient will do quite well with surgery and she may be a good candidate for minimally invasive approach. However, because of her age and history of hypertension I feel that she should have cardiac catheterization performed to rule out the presence of significant coronary artery disease prior to surgery. We will asked that this be performed as soon as practical and see the patient back in 2 weeks to make final plans for surgery. At the time of catheterization it would be helpful to image the  patient's descending thoracic and abdominal aorta to rule out the presence of significant aortoiliac occlusive disease which might preclude safe femoral artery cannulation for minimally invasive cardiac surgery.    Salvatore Decent. Cornelius Moras, MD 03/13/2013 12:06 PM

## 2013-03-13 NOTE — Telephone Encounter (Signed)
Patient called, received a message from Dr.Jordan saying Dr.Owen wants to have Dr.Jordan do a cardiac cath before surgery.Patient stated she needs to talk to daughter before she schedules cath.Stated she will call me back this afternoon or tomorrow.

## 2013-03-14 ENCOUNTER — Telehealth: Payer: Self-pay

## 2013-03-14 NOTE — Telephone Encounter (Signed)
Received call from patient she stated she needed to call her insurance before she can schedule a cath.Stated she had a few things to do and she will call me back to schedule.

## 2013-03-15 ENCOUNTER — Telehealth: Payer: Self-pay | Admitting: Cardiology

## 2013-03-15 NOTE — Telephone Encounter (Signed)
Returned call to [place] at Dr.Owen's office,waiting on patient to call me back to schedule cath.Patient stated she had to call her insurance and she had to take care of some things before she can schedule cath.

## 2013-03-15 NOTE — Telephone Encounter (Signed)
New problem   Vickie Wilson/Dr Cornelius Moras wanting to know when Cath will be sched per Dr Cornelius Moras.

## 2013-03-20 MED ORDER — RIVAROXABAN 20 MG PO TABS: 20.0000 mg | ORAL_TABLET | Freq: Every day | ORAL | Status: DC

## 2013-03-20 NOTE — Telephone Encounter (Signed)
Returned call to patient she stated she is still undecided on when to schedule a cardiac cath.Stated she will call me back.Patient stated she needed a refill on xarelto.Refill sent to pharmacy.

## 2013-03-20 NOTE — Telephone Encounter (Signed)
New Problem  Pt states she needs to speak with you, did not say what it was regarding.

## 2013-03-21 ENCOUNTER — Telehealth: Payer: Self-pay

## 2013-03-21 NOTE — Telephone Encounter (Signed)
Xarelto needs Prior Authorization. ID # [id number] Phone # [phone]

## 2013-03-21 NOTE — Telephone Encounter (Signed)
[facility] called for prior authorization for xarelto.Will be reviewing decision will be made in 24 hrs.

## 2013-03-22 NOTE — Telephone Encounter (Signed)
Returned call to patient no answer.Left message on personal voice mail [facility] was called 03/21/13 and prior authorization still under review.

## 2013-03-22 NOTE — Telephone Encounter (Signed)
New Prob     Pt states she can not afford her current blood thinner and her insurance will not cover it. Pt would like to speak to nurse regarding this.

## 2013-03-28 NOTE — Telephone Encounter (Signed)
See 03/28/13 note.

## 2013-03-28 NOTE — Telephone Encounter (Signed)
Patient stated she is still thinking about having a cardiac cath.Stated she will call me back next week with a decision.

## 2013-03-28 NOTE — Telephone Encounter (Signed)
Patient called was told received a denial letter from medicare for xarelto coverage.Dr.Jordan out of office this week,will check with him next week and call her back.

## 2013-04-03 ENCOUNTER — Ambulatory Visit: Payer: Medicare Other | Admitting: Internal Medicine

## 2013-04-04 NOTE — Telephone Encounter (Signed)
Patient called no answer.Left message to call,spoke to Dr.Jordan he was told insurance denied coverage for xarelto he advised will need to take coumadin.Advised to call me back tomorrow 04/05/13.

## 2013-04-05 ENCOUNTER — Telehealth: Payer: Self-pay | Admitting: Cardiology

## 2013-04-05 NOTE — Telephone Encounter (Signed)
Follow up ° ° °Pt returning your call °

## 2013-04-05 NOTE — Telephone Encounter (Signed)
Returned call to patient's husband no answer.

## 2013-04-06 NOTE — Telephone Encounter (Signed)
See 04/06/13 note

## 2013-04-06 NOTE — Telephone Encounter (Signed)
Patient called she refused to take Coumadin.Stated she is not going to have cardiac cath and no surgery.Dr.Owen's nurse called left her a message on personal voice mail patient not having surgery.

## 2013-04-11 ENCOUNTER — Other Ambulatory Visit: Payer: Self-pay | Admitting: Internal Medicine

## 2013-04-17 ENCOUNTER — Other Ambulatory Visit: Payer: Self-pay | Admitting: Internal Medicine

## 2013-04-25 ENCOUNTER — Other Ambulatory Visit: Payer: Self-pay | Admitting: Internal Medicine

## 2013-04-25 NOTE — Telephone Encounter (Signed)
Refill done.  

## 2013-05-23 ENCOUNTER — Ambulatory Visit (INDEPENDENT_AMBULATORY_CARE_PROVIDER_SITE_OTHER): Payer: Medicare Other | Admitting: General Practice

## 2013-05-23 ENCOUNTER — Telehealth: Payer: Self-pay | Admitting: Cardiology

## 2013-05-23 ENCOUNTER — Ambulatory Visit (INDEPENDENT_AMBULATORY_CARE_PROVIDER_SITE_OTHER): Payer: Medicare Other | Admitting: Internal Medicine

## 2013-05-23 ENCOUNTER — Encounter: Payer: Self-pay | Admitting: Internal Medicine

## 2013-05-23 VITALS — BP 160/92 | HR 60 | Temp 98.5°F | Wt 123.0 lb

## 2013-05-23 DIAGNOSIS — E119 Type 2 diabetes mellitus without complications: Secondary | ICD-10-CM

## 2013-05-23 DIAGNOSIS — I5189 Other ill-defined heart diseases: Secondary | ICD-10-CM

## 2013-05-23 DIAGNOSIS — Z23 Encounter for immunization: Secondary | ICD-10-CM

## 2013-05-23 DIAGNOSIS — R222 Localized swelling, mass and lump, trunk: Secondary | ICD-10-CM

## 2013-05-23 NOTE — Assessment & Plan Note (Signed)
2014 Dr Swaziland Xarelto is too $$$ [facility] ref

## 2013-05-23 NOTE — Telephone Encounter (Signed)
New Problem  Pt states she needs to know if it is ok to switch from Xarelto to coumadin.

## 2013-05-23 NOTE — Telephone Encounter (Signed)
Spoke to patient she stated she cannot afford Xarelto.Stated she would like to start coumadin.Dr.Jordan out of office this week will check with him on 05/26/13 and call her back.

## 2013-05-23 NOTE — Progress Notes (Signed)
Subjective:    HPI  F/u cardiac tumor. C/o Xarelto cost. The patient has been doing well overall without major physical or psychological issues going on lately.The patient presents for a follow-up of  chronic hypertension, chronic dyslipidemia, anxiety controlled with medicines  BP Readings from Last 3 Encounters:  05/23/13 160/92  03/13/13 146/81  02/02/13 180/100   Wt Readings from Last 3 Encounters:  05/23/13 123 lb (55.792 kg)  03/13/13 120 lb (54.432 kg)  02/02/13 120 lb (54.432 kg)        Review of Systems  Constitutional: Negative.  Negative for fever, chills, diaphoresis, activity change, appetite change, fatigue and unexpected weight change.  HENT: Negative for hearing loss, ear pain, nosebleeds, congestion, sore throat, facial swelling, rhinorrhea, sneezing, mouth sores, trouble swallowing, neck pain, neck stiffness, postnasal drip, sinus pressure and tinnitus.   Eyes: Negative for pain, discharge, redness, itching and visual disturbance.  Respiratory: Negative for cough, chest tightness, shortness of breath, wheezing and stridor.   Cardiovascular: Negative for chest pain, palpitations and leg swelling.  Gastrointestinal: Negative for nausea, abdominal pain, diarrhea, constipation, blood in stool, abdominal distention, anal bleeding and rectal pain.  Genitourinary: Negative for dysuria, urgency, frequency, hematuria, flank pain, vaginal bleeding, vaginal discharge, difficulty urinating, genital sores, vaginal pain and pelvic pain.  Musculoskeletal: Positive for back pain and arthralgias. Negative for joint swelling and gait problem.  Skin: Negative.  Negative for pallor and rash.  Neurological: Negative for dizziness, tremors, seizures, syncope, speech difficulty, weakness, numbness and headaches.  Hematological: Negative for adenopathy. Does not bruise/bleed easily.  Psychiatric/Behavioral: Negative for suicidal ideas, behavioral problems, confusion, sleep disturbance,  dysphoric mood and decreased concentration. The patient is not nervous/anxious.        Objective:   Physical Exam  Constitutional: She appears well-developed and well-nourished. No distress.  HENT:  Head: Normocephalic.  Right Ear: External ear normal.  Left Ear: External ear normal.  Nose: Nose normal.  Mouth/Throat: Oropharynx is clear and moist.  Eyes: Conjunctivae are normal. Pupils are equal, round, and reactive to light. Right eye exhibits no discharge. Left eye exhibits no discharge.  Neck: Normal range of motion. Neck supple. No JVD present. No tracheal deviation present. No thyromegaly present.  Cardiovascular: Normal rate, regular rhythm and normal heart sounds.   Pulmonary/Chest: No stridor. No respiratory distress. She has no wheezes.  Abdominal: Soft. Bowel sounds are normal. She exhibits no distension and no mass. There is no tenderness. There is no rebound and no guarding.  Musculoskeletal: She exhibits tenderness (over muscles). She exhibits no edema.  Lymphadenopathy:    She has no cervical adenopathy.  Neurological: She displays normal reflexes. No cranial nerve deficit. She exhibits normal muscle tone. Coordination normal.  Skin: No rash noted. No erythema.  Psychiatric: She has a normal mood and affect. Her behavior is normal. Judgment and thought content normal.  1/6 murmur   Lab Results  Component Value Date   WBC 6.1 01/16/2013   HGB 12.7 01/16/2013   HCT 37.3 01/16/2013   PLT 333.0 01/16/2013   GLUCOSE 86 01/16/2013   CHOL 207* 01/16/2013   TRIG 89.0 01/16/2013   HDL 66.20 01/16/2013   LDLDIRECT 125.6 01/16/2013   LDLCALC 120* 07/05/2012   ALT 17 01/16/2013   AST 28 01/16/2013   NA 135 01/16/2013   K 4.0 01/16/2013   CL 98 01/16/2013   CREATININE 1.2 01/16/2013   BUN 20 01/16/2013   CO2 29 01/16/2013   TSH 3.23 01/16/2013   HGBA1C 6.4  03/24/2012    EKG    Assessment & Plan:

## 2013-05-23 NOTE — Assessment & Plan Note (Signed)
Continue with current prescription therapy as reflected on the Med list.  

## 2013-05-23 NOTE — Telephone Encounter (Signed)
Returned call to patient no answer.LMTC. 

## 2013-05-26 ENCOUNTER — Telehealth: Payer: Self-pay | Admitting: Cardiology

## 2013-05-26 NOTE — Telephone Encounter (Signed)
F/u also ask Dr. Sw[place] about going back to 325 asa that she was taking before vs coumadin.

## 2013-05-29 NOTE — Telephone Encounter (Signed)
Returned call to patient no answer.LMTC. 

## 2013-05-30 NOTE — Telephone Encounter (Signed)
See previous 05/30/13 note.

## 2013-05-30 NOTE — Telephone Encounter (Signed)
Returned call to patient spoke to Dr.Jordan he advised ok to stop xarelto and start aspirin 325 mg daily.Appointment scheduled with Dr.Jordan 07/14/13.

## 2013-05-30 NOTE — Telephone Encounter (Signed)
Follow Up  Pt returning your call,

## 2013-06-05 ENCOUNTER — Other Ambulatory Visit: Payer: Self-pay | Admitting: Internal Medicine

## 2013-07-14 ENCOUNTER — Ambulatory Visit (INDEPENDENT_AMBULATORY_CARE_PROVIDER_SITE_OTHER): Payer: Medicare Other | Admitting: Cardiology

## 2013-07-14 ENCOUNTER — Encounter: Payer: Self-pay | Admitting: Cardiology

## 2013-07-14 VITALS — BP 164/88 | HR 56 | Ht 60.0 in | Wt 123.8 lb

## 2013-07-14 DIAGNOSIS — M79669 Pain in unspecified lower leg: Secondary | ICD-10-CM

## 2013-07-14 DIAGNOSIS — R222 Localized swelling, mass and lump, trunk: Secondary | ICD-10-CM

## 2013-07-14 DIAGNOSIS — I10 Essential (primary) hypertension: Secondary | ICD-10-CM

## 2013-07-14 DIAGNOSIS — M79606 Pain in leg, unspecified: Secondary | ICD-10-CM | POA: Insufficient documentation

## 2013-07-14 DIAGNOSIS — M79609 Pain in unspecified limb: Secondary | ICD-10-CM

## 2013-07-14 DIAGNOSIS — I5189 Other ill-defined heart diseases: Secondary | ICD-10-CM

## 2013-07-14 NOTE — Progress Notes (Signed)
[name] Date of Birth: [birth year] Medical Record #[id number]  History of Present Illness: [name] is seen today for followup of a right atrial mass. This was diagnosed incidentally on an echocardiogram in May. This was confirmed by MRI. She was seen in consultation by Dr. Cornelius Moras and surgical resection of the tumor was recommended. The patient declined. On followup today she states she is feeling well from a cardiac standpoint. She has no chest pain or shortness of breath. She denies any palpitations. She does complain of her calves getting very tired when she walks. She also has a sensation of water going down into her legs. She is a former smoker. She states her blood pressures always high when she goes to the doctor.  Current Outpatient Prescriptions on File Prior to Visit  Medication Sig Dispense Refill  . amLODipine (NORVASC) 5 MG tablet TAKE 1 TABLET ONCE DAILY.  30 tablet  5  . aspirin EC 325 MG tablet Take 1 tablet (325 mg total) by mouth daily.  30 tablet  6  . atenolol (TENORMIN) 100 MG tablet TAKE (1/2) TABLET TWICE DAILY.  30 tablet  5  . cholecalciferol (VITAMIN D) 1000 UNITS tablet Take 1,000 Units by mouth daily.      Marland Kitchen losartan (COZAAR) 100 MG tablet TAKE 1 TABLET EACH DAY.  90 tablet  2  . Multiple Vitamins-Minerals (CENTRUM SILVER) CHEW Chew 1 each by mouth daily.        Marland Kitchen PARoxetine (PAXIL) 10 MG tablet Take 10 mg by mouth every morning.      . rosuvastatin (CRESTOR) 10 MG tablet Take 10 mg by mouth daily.      Marland Kitchen triamterene-hydrochlorothiazide (MAXZIDE) 75-50 MG per tablet TAKE (1/2) TABLET DAILY IN THE MORNING.  15 tablet  3   No current facility-administered medications on file prior to visit.    Allergies  Allergen Reactions  . Captopril     REACTION: cough    Past Medical History  Diagnosis Date  . Anxiety   . Depression   . Hypertension   . Allergy   . Osteoporosis   . Constipation   . Diabetes mellitus   . Hyperlipidemia   . Atrial mass    right    Past Surgical History  Procedure Laterality Date  . Dilation and curettage of uterus  1987    History  Smoking status  . Former Smoker -- 1.00 packs/day for 20 years  . Quit date: 03/13/1998  Smokeless tobacco  . Never Used    History  Alcohol Use No    Family History  Problem Relation Age of Onset  . Hyperlipidemia Other   . Hypertension Other   . Stroke Other   . Heart disease Mother   . Alcohol abuse Father   . Heart disease Father     Review of Systems: As noted in history of present illness.  All other systems were reviewed and are negative.  Physical Exam: BP 164/88  Pulse 56  Ht 5' (1.524 m)  Wt 123 lb 12.8 oz (56.155 kg)  BMI 24.18 kg/m2  SpO2 99% She is a pleasant white female in no acute distress. She appears younger than her stated age. HEENT: Normal Neck: No adenopathy, thyromegaly, JVD, or bruits. Lungs: Clear Cardiovascular: Regular rate and rhythm. Normal S1 and S2. No gallop or murmur. Abdomen: Soft and nontender. Bowel sounds are positive. No but is unlikely. Extremities: No cyanosis or edema. Pedal pulses are trace bilaterally. Neuro: Alert and oriented  x3. Cranial nerves II through XII are intact.  LABORATORY DATA: Lab Results  Component Value Date   WBC 6.1 01/16/2013   HGB 12.7 01/16/2013   HCT 37.3 01/16/2013   PLT 333.0 01/16/2013   GLUCOSE 86 01/16/2013   CHOL 207* 01/16/2013   TRIG 89.0 01/16/2013   HDL 66.20 01/16/2013   LDLDIRECT 125.6 01/16/2013   LDLCALC 120* 07/05/2012   ALT 17 01/16/2013   AST 28 01/16/2013   NA 135 01/16/2013   K 4.0 01/16/2013   CL 98 01/16/2013   CREATININE 1.2 01/16/2013   BUN 20 01/16/2013   CO2 29 01/16/2013   TSH 3.23 01/16/2013   HGBA1C 6.4 03/24/2012     Assessment / Plan: 1. Right atrial mass. 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 We again reviewed the possible etiology of this tumor. She is asymptomatic. Ideally I would recommend surgical resection for tissue diagnosis and to reduce the risk of  embolic phenomenon. She is still reluctant to pursue this course. At the very least we will repeat an echocardiogram to see if there's been any significant change in this mass. If we see significant growth I would definitely recommend surgery. She would require cardiac catheterization prior to surgical resection. 2. Hypertension 3. Fatigue in the calves. Diminished pulses noted. We will check lower extremity arterial Dopplers.

## 2013-07-14 NOTE — Patient Instructions (Signed)
We will schedule you for an Echocardiogram  We will also check dopplers of your legs to check the circulation

## 2013-08-16 ENCOUNTER — Ambulatory Visit (HOSPITAL_COMMUNITY): Payer: Medicare Other | Attending: Cardiology

## 2013-08-16 DIAGNOSIS — I739 Peripheral vascular disease, unspecified: Secondary | ICD-10-CM | POA: Insufficient documentation

## 2013-08-16 DIAGNOSIS — R0989 Other specified symptoms and signs involving the circulatory and respiratory systems: Secondary | ICD-10-CM | POA: Insufficient documentation

## 2013-08-16 DIAGNOSIS — I5189 Other ill-defined heart diseases: Secondary | ICD-10-CM | POA: Insufficient documentation

## 2013-08-16 DIAGNOSIS — I10 Essential (primary) hypertension: Secondary | ICD-10-CM | POA: Insufficient documentation

## 2013-08-23 ENCOUNTER — Encounter (INDEPENDENT_AMBULATORY_CARE_PROVIDER_SITE_OTHER): Payer: Self-pay

## 2013-08-23 ENCOUNTER — Ambulatory Visit (HOSPITAL_COMMUNITY): Payer: Medicare Other | Attending: Cardiovascular Disease | Admitting: Radiology

## 2013-08-23 ENCOUNTER — Encounter: Payer: Self-pay | Admitting: Cardiovascular Disease

## 2013-08-23 DIAGNOSIS — I079 Rheumatic tricuspid valve disease, unspecified: Secondary | ICD-10-CM | POA: Insufficient documentation

## 2013-08-23 DIAGNOSIS — I10 Essential (primary) hypertension: Secondary | ICD-10-CM | POA: Insufficient documentation

## 2013-08-23 DIAGNOSIS — Z87891 Personal history of nicotine dependence: Secondary | ICD-10-CM | POA: Insufficient documentation

## 2013-08-23 DIAGNOSIS — E785 Hyperlipidemia, unspecified: Secondary | ICD-10-CM | POA: Insufficient documentation

## 2013-08-23 DIAGNOSIS — I5189 Other ill-defined heart diseases: Secondary | ICD-10-CM

## 2013-08-23 DIAGNOSIS — R222 Localized swelling, mass and lump, trunk: Secondary | ICD-10-CM | POA: Insufficient documentation

## 2013-08-23 DIAGNOSIS — E119 Type 2 diabetes mellitus without complications: Secondary | ICD-10-CM | POA: Insufficient documentation

## 2013-08-23 NOTE — Progress Notes (Signed)
Echocardiogram performed.  

## 2013-08-28 ENCOUNTER — Telehealth: Payer: Self-pay | Admitting: Cardiology

## 2013-08-28 DIAGNOSIS — I5189 Other ill-defined heart diseases: Secondary | ICD-10-CM

## 2013-08-28 NOTE — Telephone Encounter (Signed)
Follow up ° ° ° ° ° °Pt returning your call please call her back.  °

## 2013-08-28 NOTE — Telephone Encounter (Signed)
Returned call to patient echo results given.Schedulers will call to schedule repeat echo in 02/2014.

## 2013-08-30 ENCOUNTER — Other Ambulatory Visit: Payer: Self-pay | Admitting: Internal Medicine

## 2013-09-19 ENCOUNTER — Other Ambulatory Visit: Payer: Self-pay | Admitting: Internal Medicine

## 2013-10-09 ENCOUNTER — Other Ambulatory Visit: Payer: Self-pay | Admitting: Internal Medicine

## 2013-10-20 ENCOUNTER — Other Ambulatory Visit: Payer: Self-pay | Admitting: Internal Medicine

## 2013-12-08 ENCOUNTER — Encounter: Payer: Self-pay | Admitting: Internal Medicine

## 2013-12-08 ENCOUNTER — Ambulatory Visit (INDEPENDENT_AMBULATORY_CARE_PROVIDER_SITE_OTHER): Payer: Medicare Other | Admitting: Internal Medicine

## 2013-12-08 VITALS — BP 150/90 | HR 72 | Temp 98.0°F | Resp 16 | Wt 124.0 lb

## 2013-12-08 DIAGNOSIS — I10 Essential (primary) hypertension: Secondary | ICD-10-CM

## 2013-12-08 DIAGNOSIS — E119 Type 2 diabetes mellitus without complications: Secondary | ICD-10-CM

## 2013-12-08 DIAGNOSIS — I5189 Other ill-defined heart diseases: Secondary | ICD-10-CM

## 2013-12-08 DIAGNOSIS — Z23 Encounter for immunization: Secondary | ICD-10-CM

## 2013-12-08 DIAGNOSIS — F329 Major depressive disorder, single episode, unspecified: Secondary | ICD-10-CM

## 2013-12-08 DIAGNOSIS — F3289 Other specified depressive episodes: Secondary | ICD-10-CM

## 2013-12-08 NOTE — Assessment & Plan Note (Signed)
Continue with current prescription therapy as reflected on the Med list.  

## 2013-12-08 NOTE — Progress Notes (Signed)
Pre visit review using our clinic review tool, if applicable. No additional management support is needed unless otherwise documented below in the visit note. 

## 2013-12-08 NOTE — Progress Notes (Signed)
Subjective:    HPI  F/u cardiac tumor. She stopped Xarelto due to cost. On ASA now...  The patient has been doing well overall without major physical or psychological issues going on lately.The patient presents for a follow-up of  chronic hypertension, chronic dyslipidemia, anxiety controlled with medicines  BP Readings from Last 3 Encounters:  12/08/13 150/90  07/14/13 164/88  05/23/13 160/92   Wt Readings from Last 3 Encounters:  12/08/13 124 lb (56.246 kg)  07/14/13 123 lb 12.8 oz (56.155 kg)  05/23/13 123 lb (55.792 kg)        Review of Systems  Constitutional: Negative.  Negative for fever, chills, diaphoresis, activity change, appetite change, fatigue and unexpected weight change.  HENT: Negative for congestion, ear pain, facial swelling, hearing loss, mouth sores, nosebleeds, postnasal drip, rhinorrhea, sinus pressure, sneezing, sore throat, tinnitus and trouble swallowing.   Eyes: Negative for pain, discharge, redness, itching and visual disturbance.  Respiratory: Negative for cough, chest tightness, shortness of breath, wheezing and stridor.   Cardiovascular: Negative for chest pain, palpitations and leg swelling.  Gastrointestinal: Negative for nausea, abdominal pain, diarrhea, constipation, blood in stool, abdominal distention, anal bleeding and rectal pain.  Genitourinary: Negative for dysuria, urgency, frequency, hematuria, flank pain, vaginal bleeding, vaginal discharge, difficulty urinating, genital sores, vaginal pain and pelvic pain.  Musculoskeletal: Positive for arthralgias and back pain. Negative for gait problem, joint swelling, neck pain and neck stiffness.  Skin: Negative.  Negative for pallor and rash.  Neurological: Negative for dizziness, tremors, seizures, syncope, speech difficulty, weakness, numbness and headaches.  Hematological: Negative for adenopathy. Does not bruise/bleed easily.  Psychiatric/Behavioral: Negative for suicidal ideas, behavioral  problems, confusion, sleep disturbance, dysphoric mood and decreased concentration. The patient is not nervous/anxious.        Objective:   Physical Exam  Constitutional: She appears well-developed and well-nourished. No distress.  HENT:  Head: Normocephalic.  Right Ear: External ear normal.  Left Ear: External ear normal.  Nose: Nose normal.  Mouth/Throat: Oropharynx is clear and moist.  Eyes: Conjunctivae are normal. Pupils are equal, round, and reactive to light. Right eye exhibits no discharge. Left eye exhibits no discharge.  Neck: Normal range of motion. Neck supple. No JVD present. No tracheal deviation present. No thyromegaly present.  Cardiovascular: Normal rate, regular rhythm and normal heart sounds.   Pulmonary/Chest: No stridor. No respiratory distress. She has no wheezes.  Abdominal: Soft. Bowel sounds are normal. She exhibits no distension and no mass. There is no tenderness. There is no rebound and no guarding.  Musculoskeletal: She exhibits tenderness (over muscles). She exhibits no edema.  Lymphadenopathy:    She has no cervical adenopathy.  Neurological: She displays normal reflexes. No cranial nerve deficit. She exhibits normal muscle tone. Coordination normal.  Skin: No rash noted. No erythema.  Psychiatric: She has a normal mood and affect. Her behavior is normal. Judgment and thought content normal.  1/6 murmur   Lab Results  Component Value Date   WBC 6.1 01/16/2013   HGB 12.7 01/16/2013   HCT 37.3 01/16/2013   PLT 333.0 01/16/2013   GLUCOSE 86 01/16/2013   CHOL 207* 01/16/2013   TRIG 89.0 01/16/2013   HDL 66.20 01/16/2013   LDLDIRECT 125.6 01/16/2013   LDLCALC 120* 07/05/2012   ALT 17 01/16/2013   AST 28 01/16/2013   NA 135 01/16/2013   K 4.0 01/16/2013   CL 98 01/16/2013   CREATININE 1.2 01/16/2013   BUN 20 01/16/2013   CO2 29  01/16/2013   TSH 3.23 01/16/2013   HGBA1C 6.4 03/24/2012    EKG    Assessment & Plan:

## 2013-12-08 NOTE — Assessment & Plan Note (Addendum)
Continue with current prescription therapy as reflected on the Med list.  

## 2013-12-09 ENCOUNTER — Telehealth: Payer: Self-pay | Admitting: Internal Medicine

## 2013-12-09 NOTE — Telephone Encounter (Signed)
Relevant patient education mailed to patient.  

## 2013-12-13 ENCOUNTER — Other Ambulatory Visit (INDEPENDENT_AMBULATORY_CARE_PROVIDER_SITE_OTHER): Payer: Medicare Other

## 2013-12-13 DIAGNOSIS — I10 Essential (primary) hypertension: Secondary | ICD-10-CM

## 2013-12-13 DIAGNOSIS — F3289 Other specified depressive episodes: Secondary | ICD-10-CM

## 2013-12-13 DIAGNOSIS — E119 Type 2 diabetes mellitus without complications: Secondary | ICD-10-CM

## 2013-12-13 DIAGNOSIS — I5189 Other ill-defined heart diseases: Secondary | ICD-10-CM

## 2013-12-13 DIAGNOSIS — F329 Major depressive disorder, single episode, unspecified: Secondary | ICD-10-CM

## 2013-12-13 LAB — LIPID PANEL
Cholesterol: 205 mg/dL — ABNORMAL HIGH (ref 0–200)
HDL: 63.3 mg/dL (ref >39.00)
LDL Cholesterol: 131 mg/dL — ABNORMAL HIGH (ref 0–99)
Total CHOL/HDL Ratio: 3
Triglycerides: 56 mg/dL (ref 0.0–149.0)
VLDL: 11.2 mg/dL (ref 0.0–40.0)

## 2013-12-13 LAB — HEPATIC FUNCTION PANEL
ALT: 18 U/L (ref 0–35)
AST: 25 U/L (ref 0–37)
Albumin: 3.9 g/dL (ref 3.5–5.2)
Alkaline Phosphatase: 44 U/L (ref 39–117)
Bilirubin, Direct: 0 mg/dL (ref 0.0–0.3)
Total Bilirubin: 0.6 mg/dL (ref 0.3–1.2)
Total Protein: 6.8 g/dL (ref 6.0–8.3)

## 2013-12-13 LAB — BASIC METABOLIC PANEL
BUN: 25 mg/dL — ABNORMAL HIGH (ref 6–23)
CO2: 29 mEq/L (ref 19–32)
Calcium: 9.1 mg/dL (ref 8.4–10.5)
Chloride: 98 mEq/L (ref 96–112)
Creatinine, Ser: 1.3 mg/dL — ABNORMAL HIGH (ref 0.4–1.2)
GFR: 43.73 mL/min — ABNORMAL LOW (ref >60.00)
Glucose, Bld: 94 mg/dL (ref 70–99)
Potassium: 3.6 mEq/L (ref 3.5–5.1)
Sodium: 134 mEq/L — ABNORMAL LOW (ref 135–145)

## 2013-12-13 LAB — TSH: TSH: 2.73 u[IU]/mL (ref 0.35–5.50)

## 2013-12-13 LAB — HEMOGLOBIN A1C: Hgb A1c MFr Bld: 6.3 % (ref 4.6–6.5)

## 2014-01-04 ENCOUNTER — Telehealth: Payer: Self-pay

## 2014-01-04 NOTE — Telephone Encounter (Signed)
Relevant patient education mailed to patient.  

## 2014-02-02 ENCOUNTER — Ambulatory Visit: Payer: Medicare Other | Admitting: Cardiology

## 2014-02-20 ENCOUNTER — Other Ambulatory Visit: Payer: Self-pay | Admitting: Internal Medicine

## 2014-02-26 ENCOUNTER — Other Ambulatory Visit (HOSPITAL_COMMUNITY): Payer: Medicare Other

## 2014-03-09 ENCOUNTER — Encounter: Payer: Self-pay | Admitting: Internal Medicine

## 2014-03-09 ENCOUNTER — Ambulatory Visit (INDEPENDENT_AMBULATORY_CARE_PROVIDER_SITE_OTHER): Payer: Medicare Other | Admitting: Internal Medicine

## 2014-03-09 VITALS — BP 140/72 | HR 80 | Temp 98.1°F | Resp 16 | Wt 123.0 lb

## 2014-03-09 DIAGNOSIS — I10 Essential (primary) hypertension: Secondary | ICD-10-CM

## 2014-03-09 DIAGNOSIS — F329 Major depressive disorder, single episode, unspecified: Secondary | ICD-10-CM

## 2014-03-09 DIAGNOSIS — I5189 Other ill-defined heart diseases: Secondary | ICD-10-CM

## 2014-03-09 DIAGNOSIS — F411 Generalized anxiety disorder: Secondary | ICD-10-CM

## 2014-03-09 DIAGNOSIS — M2550 Pain in unspecified joint: Secondary | ICD-10-CM

## 2014-03-09 DIAGNOSIS — F3289 Other specified depressive episodes: Secondary | ICD-10-CM

## 2014-03-09 DIAGNOSIS — E119 Type 2 diabetes mellitus without complications: Secondary | ICD-10-CM

## 2014-03-09 MED ORDER — ATENOLOL 100 MG PO TABS: 50.0000 mg | ORAL_TABLET | Freq: Every day | ORAL | Status: DC

## 2014-03-09 MED ORDER — ROSUVASTATIN CALCIUM 10 MG PO TABS: 10.0000 mg | ORAL_TABLET | Freq: Every day | ORAL | Status: DC

## 2014-03-09 MED ORDER — AMLODIPINE BESYLATE 5 MG PO TABS: 5.0000 mg | ORAL_TABLET | Freq: Every day | ORAL | Status: DC

## 2014-03-09 MED ORDER — LOSARTAN POTASSIUM 100 MG PO TABS: 100.0000 mg | ORAL_TABLET | Freq: Every day | ORAL | Status: DC

## 2014-03-09 MED ORDER — PAROXETINE HCL 10 MG PO TABS: 10.0000 mg | ORAL_TABLET | ORAL | Status: DC

## 2014-03-09 MED ORDER — TRIAMTERENE-HCTZ 75-50 MG PO TABS: 0.5000 | ORAL_TABLET | Freq: Every day | ORAL | Status: DC

## 2014-03-09 NOTE — Assessment & Plan Note (Signed)
Continue with current prescription therapy as reflected on the Med list.  

## 2014-03-09 NOTE — Assessment & Plan Note (Signed)
Labs

## 2014-03-09 NOTE — Assessment & Plan Note (Signed)
Reduce Crestor to 1/2 tab 3/wk

## 2014-03-09 NOTE — Progress Notes (Signed)
Subjective:    HPI  F/u cardiac tumor. She stopped Xarelto due to cost. On ASA now.Marland Kitchen.Marland Kitchen.Doing well, except for muscle aches on Crestor...  The patient has been doing well overall without major physical or psychological issues going on lately.The patient presents for a follow-up of  chronic hypertension, chronic dyslipidemia, anxiety controlled with medicines  BP Readings from Last 3 Encounters:  03/09/14 140/72  12/08/13 150/90  07/14/13 164/88   Wt Readings from Last 3 Encounters:  03/09/14 123 lb (55.792 kg)  12/08/13 124 lb (56.246 kg)  07/14/13 123 lb 12.8 oz (56.155 kg)        Review of Systems  Constitutional: Negative.  Negative for fever, chills, diaphoresis, activity change, appetite change, fatigue and unexpected weight change.  HENT: Negative for congestion, ear pain, facial swelling, hearing loss, mouth sores, nosebleeds, postnasal drip, rhinorrhea, sinus pressure, sneezing, sore throat, tinnitus and trouble swallowing.   Eyes: Negative for pain, discharge, redness, itching and visual disturbance.  Respiratory: Negative for cough, chest tightness, shortness of breath, wheezing and stridor.   Cardiovascular: Negative for chest pain, palpitations and leg swelling.  Gastrointestinal: Negative for nausea, abdominal pain, diarrhea, constipation, blood in stool, abdominal distention, anal bleeding and rectal pain.  Genitourinary: Negative for dysuria, urgency, frequency, hematuria, flank pain, vaginal bleeding, vaginal discharge, difficulty urinating, genital sores, vaginal pain and pelvic pain.  Musculoskeletal: Positive for arthralgias and back pain. Negative for gait problem, joint swelling, neck pain and neck stiffness.  Skin: Negative.  Negative for pallor and rash.  Neurological: Negative for dizziness, tremors, seizures, syncope, speech difficulty, weakness, numbness and headaches.  Hematological: Negative for adenopathy. Does not bruise/bleed easily.   Psychiatric/Behavioral: Negative for suicidal ideas, behavioral problems, confusion, sleep disturbance, dysphoric mood and decreased concentration. The patient is not nervous/anxious.        Objective:   Physical Exam  Constitutional: She appears well-developed and well-nourished. No distress.  HENT:  Head: Normocephalic.  Right Ear: External ear normal.  Left Ear: External ear normal.  Nose: Nose normal.  Mouth/Throat: Oropharynx is clear and moist.  Eyes: Conjunctivae are normal. Pupils are equal, round, and reactive to light. Right eye exhibits no discharge. Left eye exhibits no discharge.  Neck: Normal range of motion. Neck supple. No JVD present. No tracheal deviation present. No thyromegaly present.  Cardiovascular: Normal rate, regular rhythm and normal heart sounds.   Pulmonary/Chest: No stridor. No respiratory distress. She has no wheezes.  Abdominal: Soft. Bowel sounds are normal. She exhibits no distension and no mass. There is no tenderness. There is no rebound and no guarding.  Musculoskeletal: She exhibits tenderness (over muscles). She exhibits no edema.  Lymphadenopathy:    She has no cervical adenopathy.  Neurological: She displays normal reflexes. No cranial nerve deficit. She exhibits normal muscle tone. Coordination normal.  Skin: No rash noted. No erythema.  Psychiatric: She has a normal mood and affect. Her behavior is normal. Judgment and thought content normal.  1/6 murmur   Lab Results  Component Value Date   WBC 6.1 01/16/2013   HGB 12.7 01/16/2013   HCT 37.3 01/16/2013   PLT 333.0 01/16/2013   GLUCOSE 94 12/13/2013   CHOL 205* 12/13/2013   TRIG 56.0 12/13/2013   HDL 63.30 12/13/2013   LDLDIRECT 125.6 01/16/2013   LDLCALC 131* 12/13/2013   ALT 18 12/13/2013   AST 25 12/13/2013   NA 134* 12/13/2013   K 3.6 12/13/2013   CL 98 12/13/2013   CREATININE 1.3* 12/13/2013  BUN 25* 12/13/2013   CO2 29 12/13/2013   TSH 2.73 12/13/2013   HGBA1C 6.3 12/13/2013    EKG    Assessment &  Plan:

## 2014-03-09 NOTE — Progress Notes (Signed)
Pre visit review using our clinic review tool, if applicable. No additional management support is needed unless otherwise documented below in the visit note. 

## 2014-03-09 NOTE — Assessment & Plan Note (Signed)
Card appt w/Dr [place] is pending On ASA

## 2014-03-19 ENCOUNTER — Ambulatory Visit (HOSPITAL_COMMUNITY): Payer: Medicare Other | Attending: Cardiology | Admitting: Radiology

## 2014-03-19 DIAGNOSIS — I5189 Other ill-defined heart diseases: Secondary | ICD-10-CM

## 2014-03-19 DIAGNOSIS — R222 Localized swelling, mass and lump, trunk: Secondary | ICD-10-CM

## 2014-03-19 NOTE — Progress Notes (Signed)
Echocardiogram performed.  

## 2014-03-20 ENCOUNTER — Telehealth: Payer: Self-pay | Admitting: *Deleted

## 2014-03-20 NOTE — Telephone Encounter (Signed)
Left mess for patient to call back. Pharmacy needs to know how pt has been taking Atenolol 100 mg. QD or bid?

## 2014-03-20 NOTE — Telephone Encounter (Signed)
Pt stated that she is taking the Atenolol 1/2 tab BID

## 2014-03-21 MED ORDER — ATENOLOL 100 MG PO TABS: 50.0000 mg | ORAL_TABLET | Freq: Two times a day (BID) | ORAL | Status: DC

## 2014-03-21 NOTE — Telephone Encounter (Signed)
rx resent in and pt notified

## 2014-03-21 NOTE — Telephone Encounter (Signed)
Pt called and left message on triage to speak to stacey, i returned the called but left a message on her voicemail

## 2014-03-21 NOTE — Telephone Encounter (Signed)
OK 1/2 tab bid Thx

## 2014-03-21 NOTE — Telephone Encounter (Signed)
Pt states that she was originally ordered to take atenolol 1/2 tab po in the am and 1/2 tab po in the pm.  The new rx states that she needs to take 1/2 tab po once daily.  Please advise

## 2014-03-26 ENCOUNTER — Other Ambulatory Visit: Payer: Self-pay | Admitting: Internal Medicine

## 2014-03-30 ENCOUNTER — Telehealth: Payer: Self-pay | Admitting: Cardiology

## 2014-03-30 NOTE — Telephone Encounter (Signed)
Wants to know id she should continue taking the aspitin 325 mg or back to a lower dose?

## 2014-03-30 NOTE — Telephone Encounter (Signed)
ASA 81 mg daily is fine.  Vickie Demps [place] MD, [facility]

## 2014-03-30 NOTE — Telephone Encounter (Signed)
Returned call to patient she wanted to know if she should take aspirin 325 mg or 81 mg.Message sent to Dr.Jordan for advice.

## 2014-03-30 NOTE — Telephone Encounter (Signed)
Returned call to patient Dr.Jordan advised aspirin 81 mg daily.

## 2014-04-21 ENCOUNTER — Telehealth: Payer: Self-pay

## 2014-04-21 MED ORDER — AMOXICILLIN-POT CLAVULANATE 500-125 MG PO TABS: 1.0000 | ORAL_TABLET | Freq: Three times a day (TID) | ORAL | Status: DC

## 2014-04-21 NOTE — Telephone Encounter (Signed)
Pt is calling to request an abx.  Pt thinks she may have a abcess on her tooth and Dr. Posey ReaPlotnikov usually calls in medication for pt.  Pt was offered an appt by [facility] but pt is not able to come into the office today.  If an [facility] can be called [facility]  Pls advise.  Pt can be reached at [phone].

## 2014-04-21 NOTE — Telephone Encounter (Signed)
Augmentin rx sent to pt's pharmacy. I called pt and told her. She does not currently have a dentist, cites financial difficulties.  Says she is going to continue working on getting dental care, though.

## 2014-04-26 ENCOUNTER — Telehealth: Payer: Self-pay | Admitting: *Deleted

## 2014-04-26 NOTE — Telephone Encounter (Signed)
[facility] Triage Call Report Triage Record Num: [id number] Operator: Amy Hartman Patient Name: Vickie Wilson Call Date & Time: 04/21/2014 12:51:26PM Patient Phone: [phone] PCP: Valerie Leschber Patient Gender: Female PCP Fax : [phone] Patient DOB: 09/13/1938 Practice Name: [facility] Reason for Call: Caller: Harvey/Other; PCP: Leschber, Valerie (Adults only); CB#: [phone]; Call regarding Confusion; Onset 04/20/14. Caller reports patient complained of urinary symptoms recently; stays in bathroom for up to 40 minutes. She has asked for her (deceased) mother on 04/21/14. Emergent symptoms ruled out. See ED Immediately per Confusion, Disorientation, Agitation guideline due to New or worsening confusion, disorientation or agitation. Caller states plan to go to [place] in spite of information provided that [facility] is preferred facility. Protocol(s) Used: Confusion, Disorientation, Agitation Recommended Outcome per Protocol: See ED Immediately Reason for Outcome: New or worsening confusion, disorientation, or agitation Care Advice: ~ Another adult should drive. ~ Do not leave patient alone. ~ Tell the individual what will happen; do not make it their choice. Call EMS 911 if confusion / disorientation / agitation becomes unmanageable to the point of causing harm to self or others. ~ Write down provider's name. List or place the following in a bag for transport with the patient: current prescription and/or nonprescription medications; alternative treatments, therapies and medications; and street drugs. ~ 08/ 

## 2014-04-27 ENCOUNTER — Telehealth: Payer: Self-pay | Admitting: Internal Medicine

## 2014-04-27 NOTE — Telephone Encounter (Signed)
Pt was wondering if Dr. Macario GoldsPlot know any dental surgeon that he recommend, she going to have surgery but she wonder if Dr. Macario GoldsPlot know anyone. Please call pt

## 2014-04-27 NOTE — Telephone Encounter (Signed)
Left detail massage for pt.  

## 2014-04-27 NOTE — Telephone Encounter (Signed)
[facility] Dr Retta MacMohorn Thx

## 2014-04-30 ENCOUNTER — Telehealth: Payer: Self-pay | Admitting: *Deleted

## 2014-04-30 NOTE — Telephone Encounter (Signed)
Ok to d/c augmentin now Thx

## 2014-04-30 NOTE — Telephone Encounter (Signed)
Called pt no answer LMOM with md response.../lmb 

## 2014-04-30 NOTE — Telephone Encounter (Signed)
Pt left msg on triage stating md rx augmentin since taking the antibiotic she has been very nauseas. She has 2 days left on the augmentin wanting to know can she stop taking due to being nausea...[name]/lmb

## 2014-05-31 ENCOUNTER — Encounter: Payer: Self-pay | Admitting: Internal Medicine

## 2014-06-19 ENCOUNTER — Telehealth: Payer: Self-pay | Admitting: Internal Medicine
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
## 2014-06-19 NOTE — Telephone Encounter (Signed)
Use a tripple abx OTC oin bid, tid. Cover w/band-aid or TELFA pad Thx

## 2014-06-20 NOTE — Telephone Encounter (Signed)
OV w/any provider ASAP Thx

## 2014-06-20 NOTE — Telephone Encounter (Signed)
Patient states she is doing this already.  She states her hand is swollen.  Tried to get patient to make appointment.  She wanted you to know this first.  Please advise.

## 2014-06-21 ENCOUNTER — Telehealth: Payer: Self-pay | Admitting: Internal Medicine
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
## 2014-06-21 NOTE — Telephone Encounter (Signed)
Patient returned phone call.  She states that her hand is not as swollen and she thinks she will be ok until her appt tomorrow.  I transferred her to the nurse line to get the nurse opinion.

## 2014-06-21 NOTE — Telephone Encounter (Signed)
Patient has appt with Plot tomorrow the 9th.  Left vm for patient to call back to get worked in today for her hand.

## 2014-06-22 ENCOUNTER — Encounter: Payer: Self-pay | Admitting: Internal Medicine

## 2014-06-22 ENCOUNTER — Ambulatory Visit (INDEPENDENT_AMBULATORY_CARE_PROVIDER_SITE_OTHER): Payer: Medicare Other | Admitting: Internal Medicine

## 2014-06-22 VITALS — BP 110/72 | HR 76 | Temp 98.5°F | Resp 16 | Wt 120.0 lb

## 2014-06-22 DIAGNOSIS — Z23 Encounter for immunization: Secondary | ICD-10-CM

## 2014-06-22 DIAGNOSIS — E119 Type 2 diabetes mellitus without complications: Secondary | ICD-10-CM

## 2014-06-22 DIAGNOSIS — T23002A Burn of unspecified degree of left hand, unspecified site, initial encounter: Secondary | ICD-10-CM | POA: Insufficient documentation

## 2014-06-22 DIAGNOSIS — R222 Localized swelling, mass and lump, trunk: Secondary | ICD-10-CM

## 2014-06-22 DIAGNOSIS — F411 Generalized anxiety disorder: Secondary | ICD-10-CM

## 2014-06-22 DIAGNOSIS — I5189 Other ill-defined heart diseases: Secondary | ICD-10-CM

## 2014-06-22 DIAGNOSIS — T23202A Burn of second degree of left hand, unspecified site, initial encounter: Secondary | ICD-10-CM

## 2014-06-22 DIAGNOSIS — E785 Hyperlipidemia, unspecified: Secondary | ICD-10-CM

## 2014-06-22 MED ORDER — ROSUVASTATIN CALCIUM 10 MG PO TABS: 10.0000 mg | ORAL_TABLET | Freq: Every day | ORAL | Status: DC

## 2014-06-22 NOTE — Assessment & Plan Note (Signed)
Doing good  

## 2014-06-22 NOTE — Assessment & Plan Note (Signed)
Doing well on diet ?

## 2014-06-22 NOTE — Assessment & Plan Note (Signed)
Continue with current prescription therapy as reflected on the Med list.  

## 2014-06-22 NOTE — Progress Notes (Signed)
Subjective:    HPI  F/u cardiac tumor.  7/15: Echo looks very good. Previously noted right atrial mass has resolved. This may have been a thrombus that has lysed but we can only conjecture what this was. The good news is that it is resolved. C/o L hand burn a week ago  The patient has been doing well overall without major physical or psychological issues going on lately.The patient presents for a follow-up of  chronic hypertension, chronic dyslipidemia, anxiety controlled with medicines  BP Readings from Last 3 Encounters:  06/22/14 110/72  03/09/14 140/72  12/08/13 150/90   Wt Readings from Last 3 Encounters:  06/22/14 120 lb (54.432 kg)  03/09/14 123 lb (55.792 kg)  12/08/13 124 lb (56.246 kg)        Review of Systems  Constitutional: Negative.  Negative for fever, chills, diaphoresis, activity change, appetite change, fatigue and unexpected weight change.  HENT: Negative for congestion, ear pain, facial swelling, hearing loss, mouth sores, nosebleeds, postnasal drip, rhinorrhea, sinus pressure, sneezing, sore throat, tinnitus and trouble swallowing.   Eyes: Negative for pain, discharge, redness, itching and visual disturbance.  Respiratory: Negative for cough, chest tightness, shortness of breath, wheezing and stridor.   Cardiovascular: Negative for chest pain, palpitations and leg swelling.  Gastrointestinal: Negative for nausea, abdominal pain, diarrhea, constipation, blood in stool, abdominal distention, anal bleeding and rectal pain.  Genitourinary: Negative for dysuria, urgency, frequency, hematuria, flank pain, vaginal bleeding, vaginal discharge, difficulty urinating, genital sores, vaginal pain and pelvic pain.  Musculoskeletal: Positive for arthralgias and back pain. Negative for gait problem, joint swelling, neck pain and neck stiffness.  Skin: Negative.  Negative for pallor and rash.  Neurological: Negative for dizziness, tremors, seizures, syncope, speech  difficulty, weakness, numbness and headaches.  Hematological: Negative for adenopathy. Does not bruise/bleed easily.  Psychiatric/Behavioral: Negative for suicidal ideas, behavioral problems, confusion, sleep disturbance, dysphoric mood and decreased concentration. The patient is not nervous/anxious.        Objective:   Physical Exam  Constitutional: She appears well-developed and well-nourished. No distress.  HENT:  Head: Normocephalic.  Right Ear: External ear normal.  Left Ear: External ear normal.  Nose: Nose normal.  Mouth/Throat: Oropharynx is clear and moist.  Eyes: Conjunctivae are normal. Pupils are equal, round, and reactive to light. Right eye exhibits no discharge. Left eye exhibits no discharge.  Neck: Normal range of motion. Neck supple. No JVD present. No tracheal deviation present. No thyromegaly present.  Cardiovascular: Normal rate, regular rhythm and normal heart sounds.   Pulmonary/Chest: No stridor. No respiratory distress. She has no wheezes.  Abdominal: Soft. Bowel sounds are normal. She exhibits no distension and no mass. There is no tenderness. There is no rebound and no guarding.  Musculoskeletal: She exhibits tenderness (over muscles). She exhibits no edema.  Lymphadenopathy:    She has no cervical adenopathy.  Neurological: She displays normal reflexes. No cranial nerve deficit. She exhibits normal muscle tone. Coordination normal.  Skin: No rash noted. No erythema.  Psychiatric: She has a normal mood and affect. Her behavior is normal. Judgment and thought content normal.  1/6 murmur L hand healing burn   Lab Results  Component Value Date   WBC 6.1 01/16/2013   HGB 12.7 01/16/2013   HCT 37.3 01/16/2013   PLT 333.0 01/16/2013   GLUCOSE 94 12/13/2013   CHOL 205* 12/13/2013   TRIG 56.0 12/13/2013   HDL 63.30 12/13/2013   LDLDIRECT 125.6 01/16/2013   LDLCALC 131* 12/13/2013  ALT 18 12/13/2013   AST 25 12/13/2013   NA 134* 12/13/2013   K 3.6 12/13/2013   CL 98 12/13/2013    CREATININE 1.3* 12/13/2013   BUN 25* 12/13/2013   CO2 29 12/13/2013   TSH 2.73 12/13/2013   HGBA1C 6.3 12/13/2013    EKG    Assessment & Plan:

## 2014-06-22 NOTE — Progress Notes (Signed)
Pre visit review using our clinic review tool, if applicable. No additional management support is needed unless otherwise documented below in the visit note. 

## 2014-06-22 NOTE — Assessment & Plan Note (Signed)
Dressed 

## 2014-06-22 NOTE — Assessment & Plan Note (Signed)
  7/15: Echo looks very good. Previously noted right atrial mass has resolved. This may have been a thrombus that has lysed but we can only conjecture what this was. The good news is that it is resolved.

## 2014-06-22 NOTE — Patient Instructions (Signed)
Take Losartan 1/2 tab if dizzy

## 2014-06-28 ENCOUNTER — Other Ambulatory Visit (INDEPENDENT_AMBULATORY_CARE_PROVIDER_SITE_OTHER): Payer: Medicare Other

## 2014-06-28 DIAGNOSIS — E119 Type 2 diabetes mellitus without complications: Secondary | ICD-10-CM

## 2014-06-28 DIAGNOSIS — R222 Localized swelling, mass and lump, trunk: Secondary | ICD-10-CM

## 2014-06-28 DIAGNOSIS — E785 Hyperlipidemia, unspecified: Secondary | ICD-10-CM

## 2014-06-28 DIAGNOSIS — F411 Generalized anxiety disorder: Secondary | ICD-10-CM

## 2014-06-28 DIAGNOSIS — T23202A Burn of second degree of left hand, unspecified site, initial encounter: Secondary | ICD-10-CM

## 2014-06-28 DIAGNOSIS — I5189 Other ill-defined heart diseases: Secondary | ICD-10-CM

## 2014-06-28 LAB — BASIC METABOLIC PANEL
BUN: 29 mg/dL — ABNORMAL HIGH (ref 6–23)
CO2: 32 mEq/L (ref 19–32)
Calcium: 9.3 mg/dL (ref 8.4–10.5)
Chloride: 99 mEq/L (ref 96–112)
Creatinine, Ser: 1.4 mg/dL — ABNORMAL HIGH (ref 0.4–1.2)
GFR: 38.96 mL/min — ABNORMAL LOW (ref >60.00)
Glucose, Bld: 101 mg/dL — ABNORMAL HIGH (ref 70–99)
Potassium: 4.5 mEq/L (ref 3.5–5.1)
Sodium: 138 mEq/L (ref 135–145)

## 2014-06-28 LAB — HEPATIC FUNCTION PANEL
ALT: 11 U/L (ref 0–35)
AST: 27 U/L (ref 0–37)
Albumin: 3.6 g/dL (ref 3.5–5.2)
Alkaline Phosphatase: 45 U/L (ref 39–117)
Bilirubin, Direct: 0.1 mg/dL (ref 0.0–0.3)
Total Bilirubin: 0.5 mg/dL (ref 0.2–1.2)
Total Protein: 7.1 g/dL (ref 6.0–8.3)

## 2014-06-28 LAB — LIPID PANEL
Cholesterol: 245 mg/dL — ABNORMAL HIGH (ref 0–200)
HDL: 56.1 mg/dL (ref >39.00)
LDL Cholesterol: 172 mg/dL — ABNORMAL HIGH (ref 0–99)
NonHDL: 188.9
Total CHOL/HDL Ratio: 4
Triglycerides: 83 mg/dL (ref 0.0–149.0)
VLDL: 16.6 mg/dL (ref 0.0–40.0)

## 2014-06-28 LAB — TSH: TSH: 2.28 u[IU]/mL (ref 0.35–4.50)

## 2014-06-28 LAB — HEMOGLOBIN A1C: Hgb A1c MFr Bld: 6.3 % (ref 4.6–6.5)

## 2014-07-11 ENCOUNTER — Telehealth: Payer: Self-pay | Admitting: Internal Medicine

## 2014-07-11 NOTE — Telephone Encounter (Signed)
Pt wants to know if she should continue taking a 1/2 pill, water pill. Please advise.

## 2014-07-11 NOTE — Telephone Encounter (Signed)
Notified pt with md response.../lmb 

## 2014-07-11 NOTE — Telephone Encounter (Signed)
Yes. Thx.

## 2014-08-22 ENCOUNTER — Other Ambulatory Visit: Payer: Self-pay

## 2014-08-22 MED ORDER — ROSUVASTATIN CALCIUM 10 MG PO TABS: 10.0000 mg | ORAL_TABLET | Freq: Every day | ORAL | Status: DC

## 2014-08-23 ENCOUNTER — Telehealth: Payer: Self-pay | Admitting: Internal Medicine

## 2014-08-23 ENCOUNTER — Other Ambulatory Visit: Payer: Self-pay

## 2014-08-23 MED ORDER — ROSUVASTATIN CALCIUM 20 MG PO TABS: 20.0000 mg | ORAL_TABLET | Freq: Every day | ORAL | Status: DC

## 2014-08-23 MED ORDER — ROSUVASTATIN CALCIUM 10 MG PO TABS: 10.0000 mg | ORAL_TABLET | Freq: Every day | ORAL | Status: DC

## 2014-08-23 NOTE — Telephone Encounter (Signed)
LVM for pt to call back.   erx done yesterday.  Sent 30 day supply to local erx and 90 day supply to the mail order.

## 2014-08-23 NOTE — Telephone Encounter (Signed)
Pt called in very rude and upset wants her crestor called in for 20mg  and not 10mg .  She said that the 10mg  is to expensive and she wants the 20mg  called into [facility] today..    She stated " i dont  wanna have to call back about this again.  It better be handled today"         Best number [phone]

## 2014-08-23 NOTE — Addendum Note (Signed)
Addended by: Tresa GarterPLOTNIKOV, ALEKSEI V on: 08/23/2014 01:38 PM   Modules accepted: Orders, Medications

## 2014-08-23 NOTE — Telephone Encounter (Signed)
Ok Thx 

## 2014-08-30 ENCOUNTER — Telehealth: Payer: Self-pay | Admitting: Internal Medicine

## 2014-08-30 NOTE — Telephone Encounter (Signed)
Needing clarification on strength for Crestor.

## 2014-08-31 NOTE — Telephone Encounter (Signed)
20 mg is correct. [facility] informed.

## 2014-09-28 ENCOUNTER — Ambulatory Visit: Payer: Medicare Other | Admitting: Internal Medicine

## 2014-09-28 ENCOUNTER — Telehealth: Payer: Self-pay | Admitting: Internal Medicine

## 2014-09-28 NOTE — Telephone Encounter (Signed)
Patient is requesting form to be completed today that she dropped off Tuesday.  She would like a call back in regards.

## 2014-10-02 ENCOUNTER — Telehealth: Payer: Self-pay | Admitting: Internal Medicine

## 2014-10-02 NOTE — Telephone Encounter (Signed)
Pt called in and said that she wanted to know why the paperwork she picked up today had  Diabetic circled   and she has never been told she has had that??  She dont understand why?

## 2014-10-02 NOTE — Telephone Encounter (Signed)
Not sure what it was. We have been watching elevated sugars - "pre-diabetes". Thx

## 2014-10-04 NOTE — Telephone Encounter (Signed)
Pt informed

## 2014-10-05 ENCOUNTER — Ambulatory Visit: Payer: Self-pay | Admitting: Internal Medicine

## 2014-10-09 ENCOUNTER — Telehealth: Payer: Self-pay | Admitting: Internal Medicine

## 2014-10-09 NOTE — Telephone Encounter (Signed)
Pt called in and needs a letter stating that she is able to live on her own and care for herself.  She is trying to move into independent  living apartment    [facility]    Fax numberr [phone] [facility]

## 2014-10-10 NOTE — Telephone Encounter (Signed)
Generated [facility]...[name]/lmb

## 2014-10-10 NOTE — Telephone Encounter (Signed)
Ok, pls write  Thx

## 2014-10-16 NOTE — Telephone Encounter (Signed)
Pt called in said that they did not get letter, wanted to know if we could refax it.  I verified number.  It is correct

## 2014-10-16 NOTE — Telephone Encounter (Signed)
Refax letter again to [phone] Att: [facility]Bar[facility]...[name]/lmb

## 2014-10-30 ENCOUNTER — Ambulatory Visit (INDEPENDENT_AMBULATORY_CARE_PROVIDER_SITE_OTHER): Payer: Medicare Other | Admitting: Internal Medicine

## 2014-10-30 ENCOUNTER — Encounter: Payer: Self-pay | Admitting: Internal Medicine

## 2014-10-30 VITALS — BP 144/82 | HR 62 | Temp 98.6°F | Wt 122.0 lb

## 2014-10-30 DIAGNOSIS — F411 Generalized anxiety disorder: Secondary | ICD-10-CM | POA: Diagnosis not present

## 2014-10-30 DIAGNOSIS — F32A Depression, unspecified: Secondary | ICD-10-CM

## 2014-10-30 DIAGNOSIS — I10 Essential (primary) hypertension: Secondary | ICD-10-CM

## 2014-10-30 DIAGNOSIS — F329 Major depressive disorder, single episode, unspecified: Secondary | ICD-10-CM | POA: Diagnosis not present

## 2014-10-30 DIAGNOSIS — R739 Hyperglycemia, unspecified: Secondary | ICD-10-CM

## 2014-10-30 DIAGNOSIS — E785 Hyperlipidemia, unspecified: Secondary | ICD-10-CM | POA: Diagnosis not present

## 2014-10-30 NOTE — Assessment & Plan Note (Signed)
Continue with current prescription therapy as reflected on the Med list.  

## 2014-10-30 NOTE — Progress Notes (Signed)
Pre visit review using our clinic review tool, if applicable. No additional management support is needed unless otherwise documented below in the visit note. 

## 2014-10-30 NOTE — Assessment & Plan Note (Signed)
On Paxil 

## 2014-10-30 NOTE — Progress Notes (Signed)
Subjective:    HPI  F/u cardiac tumor.  7/15: Echo looks very good. Previously noted right atrial mass has resolved. This may have been a thrombus that has lysed but we can only conjecture what this was. The good news is that it is resolved.   The patient has been doing well overall without major physical or psychological issues going on lately.The patient presents for a follow-up of  chronic hypertension, chronic dyslipidemia, anxiety controlled with medicines  BP Readings from Last 3 Encounters:  10/30/14 144/82  06/22/14 110/72  03/09/14 140/72   Wt Readings from Last 3 Encounters:  10/30/14 122 lb (55.339 kg)  06/22/14 120 lb (54.432 kg)  03/09/14 123 lb (55.792 kg)        Review of Systems  Constitutional: Negative.  Negative for fever, chills, diaphoresis, activity change, appetite change, fatigue and unexpected weight change.  HENT: Negative for congestion, ear pain, facial swelling, hearing loss, mouth sores, nosebleeds, postnasal drip, rhinorrhea, sinus pressure, sneezing, sore throat, tinnitus and trouble swallowing.   Eyes: Negative for pain, discharge, redness, itching and visual disturbance.  Respiratory: Negative for cough, chest tightness, shortness of breath, wheezing and stridor.   Cardiovascular: Negative for chest pain, palpitations and leg swelling.  Gastrointestinal: Negative for nausea, abdominal pain, diarrhea, constipation, blood in stool, abdominal distention, anal bleeding and rectal pain.  Genitourinary: Negative for dysuria, urgency, frequency, hematuria, flank pain, vaginal bleeding, vaginal discharge, difficulty urinating, genital sores, vaginal pain and pelvic pain.  Musculoskeletal: Positive for back pain and arthralgias. Negative for joint swelling, gait problem, neck pain and neck stiffness.  Skin: Negative.  Negative for pallor and rash.  Neurological: Negative for dizziness, tremors, seizures, syncope, speech difficulty, weakness, numbness  and headaches.  Hematological: Negative for adenopathy. Does not bruise/bleed easily.  Psychiatric/Behavioral: Negative for suicidal ideas, behavioral problems, confusion, sleep disturbance, dysphoric mood and decreased concentration. The patient is not nervous/anxious.        Objective:   Physical Exam  Constitutional: She appears well-developed and well-nourished. No distress.  HENT:  Head: Normocephalic.  Right Ear: External ear normal.  Left Ear: External ear normal.  Nose: Nose normal.  Mouth/Throat: Oropharynx is clear and moist.  Eyes: Conjunctivae are normal. Pupils are equal, round, and reactive to light. Right eye exhibits no discharge. Left eye exhibits no discharge.  Neck: Normal range of motion. Neck supple. No JVD present. No tracheal deviation present. No thyromegaly present.  Cardiovascular: Normal rate, regular rhythm and normal heart sounds.   Pulmonary/Chest: No stridor. No respiratory distress. She has no wheezes.  Abdominal: Soft. Bowel sounds are normal. She exhibits no distension and no mass. There is no tenderness. There is no rebound and no guarding.  Musculoskeletal: She exhibits tenderness (over muscles). She exhibits no edema.  Lymphadenopathy:    She has no cervical adenopathy.  Neurological: She displays normal reflexes. No cranial nerve deficit. She exhibits normal muscle tone. Coordination normal.  Skin: No rash noted. No erythema.  Psychiatric: She has a normal mood and affect. Her behavior is normal. Judgment and thought content normal.  1/6 murmur    Lab Results  Component Value Date   WBC 6.1 01/16/2013   HGB 12.7 01/16/2013   HCT 37.3 01/16/2013   PLT 333.0 01/16/2013   GLUCOSE 101* 06/28/2014   CHOL 245* 06/28/2014   TRIG 83.0 06/28/2014   HDL 56.10 06/28/2014   LDLDIRECT 125.6 01/16/2013   LDLCALC 172* 06/28/2014   ALT 11 06/28/2014   AST 27 06/28/2014  NA 138 06/28/2014   K 4.5 06/28/2014   CL 99 06/28/2014   CREATININE 1.4*  06/28/2014   BUN 29* 06/28/2014   CO2 32 06/28/2014   TSH 2.28 06/28/2014   HGBA1C 6.3 06/28/2014    EKG    Assessment & Plan:

## 2014-11-06 ENCOUNTER — Other Ambulatory Visit (INDEPENDENT_AMBULATORY_CARE_PROVIDER_SITE_OTHER): Payer: Medicare Other

## 2014-11-06 DIAGNOSIS — I10 Essential (primary) hypertension: Secondary | ICD-10-CM | POA: Diagnosis not present

## 2014-11-06 DIAGNOSIS — R739 Hyperglycemia, unspecified: Secondary | ICD-10-CM | POA: Diagnosis not present

## 2014-11-06 DIAGNOSIS — F329 Major depressive disorder, single episode, unspecified: Secondary | ICD-10-CM | POA: Diagnosis not present

## 2014-11-06 DIAGNOSIS — E785 Hyperlipidemia, unspecified: Secondary | ICD-10-CM

## 2014-11-06 LAB — BASIC METABOLIC PANEL
BUN: 24 mg/dL — ABNORMAL HIGH (ref 6–23)
CO2: 31 mEq/L (ref 19–32)
Calcium: 9.5 mg/dL (ref 8.4–10.5)
Chloride: 99 mEq/L (ref 96–112)
Creatinine, Ser: 1.21 mg/dL — ABNORMAL HIGH (ref 0.40–1.20)
GFR: 45.3 mL/min — ABNORMAL LOW (ref >60.00)
Glucose, Bld: 102 mg/dL — ABNORMAL HIGH (ref 70–99)
Potassium: 3.5 mEq/L (ref 3.5–5.1)
Sodium: 135 mEq/L (ref 135–145)

## 2014-11-06 LAB — LIPID PANEL
Cholesterol: 198 mg/dL (ref 0–200)
HDL: 69.3 mg/dL (ref >39.00)
LDL Cholesterol: 108 mg/dL — ABNORMAL HIGH (ref 0–99)
NonHDL: 128.7
Total CHOL/HDL Ratio: 3
Triglycerides: 102 mg/dL (ref 0.0–149.0)
VLDL: 20.4 mg/dL (ref 0.0–40.0)

## 2014-11-06 LAB — HEMOGLOBIN A1C: Hgb A1c MFr Bld: 6.4 % (ref 4.6–6.5)

## 2014-11-06 LAB — HEPATIC FUNCTION PANEL
ALT: 14 U/L (ref 0–35)
AST: 25 U/L (ref 0–37)
Albumin: 4.3 g/dL (ref 3.5–5.2)
Alkaline Phosphatase: 44 U/L (ref 39–117)
Bilirubin, Direct: 0.1 mg/dL (ref 0.0–0.3)
Total Bilirubin: 0.5 mg/dL (ref 0.2–1.2)
Total Protein: 7.1 g/dL (ref 6.0–8.3)

## 2014-12-20 ENCOUNTER — Telehealth: Payer: Self-pay | Admitting: Internal Medicine

## 2014-12-20 NOTE — Telephone Encounter (Signed)
Patient states the last few weeks her eyes have been really red.  She is [facility] or a suggestion of something OTC.

## 2014-12-20 NOTE — Telephone Encounter (Signed)
Use "clear eyes" OTC Pt should sch w/her eye dr Tacy Learnhx

## 2014-12-21 NOTE — Telephone Encounter (Signed)
Notified pt with md response.../lmb 

## 2015-02-01 ENCOUNTER — Encounter: Payer: Self-pay | Admitting: Internal Medicine

## 2015-02-01 ENCOUNTER — Ambulatory Visit (INDEPENDENT_AMBULATORY_CARE_PROVIDER_SITE_OTHER): Payer: Medicare Other | Admitting: Internal Medicine

## 2015-02-01 VITALS — BP 130/80 | HR 55 | Ht 60.0 in | Wt 119.0 lb

## 2015-02-01 DIAGNOSIS — I10 Essential (primary) hypertension: Secondary | ICD-10-CM | POA: Diagnosis not present

## 2015-02-01 DIAGNOSIS — L570 Actinic keratosis: Secondary | ICD-10-CM | POA: Diagnosis not present

## 2015-02-01 DIAGNOSIS — E785 Hyperlipidemia, unspecified: Secondary | ICD-10-CM

## 2015-02-01 DIAGNOSIS — H269 Unspecified cataract: Secondary | ICD-10-CM

## 2015-02-01 DIAGNOSIS — Z0000 Encounter for general adult medical examination without abnormal findings: Secondary | ICD-10-CM

## 2015-02-01 NOTE — Progress Notes (Signed)
Subjective:    HPI  The patient is here for a wellness exam. The patient has been doing well overall without major physical or psychological issues going on lately.  F/u cardiac tumor.   7/15: Echo looks very good. Previously noted right atrial mass has resolved. This may have been a thrombus that has lysed but we can only conjecture what this was. The good news is that it is resolved.   The patient has been doing well overall without major physical or psychological issues going on lately.The patient presents for a follow-up of  chronic hypertension, chronic dyslipidemia, anxiety controlled with medicines  BP Readings from Last 3 Encounters:  02/01/15 130/80  10/30/14 144/82  06/22/14 110/72   Wt Readings from Last 3 Encounters:  02/01/15 119 lb (53.978 kg)  10/30/14 122 lb (55.339 kg)  06/22/14 120 lb (54.432 kg)        Review of Systems  Constitutional: Negative.  Negative for fever, chills, diaphoresis, activity change, appetite change, fatigue and unexpected weight change.  HENT: Negative for congestion, ear pain, facial swelling, hearing loss, mouth sores, nosebleeds, postnasal drip, rhinorrhea, sinus pressure, sneezing, sore throat, tinnitus and trouble swallowing.   Eyes: Negative for pain, discharge, redness, itching and visual disturbance.  Respiratory: Negative for cough, chest tightness, shortness of breath, wheezing and stridor.   Cardiovascular: Negative for chest pain, palpitations and leg swelling.  Gastrointestinal: Negative for nausea, abdominal pain, diarrhea, constipation, blood in stool, abdominal distention, anal bleeding and rectal pain.  Genitourinary: Negative for dysuria, urgency, frequency, hematuria, flank pain, vaginal bleeding, vaginal discharge, difficulty urinating, genital sores, vaginal pain and pelvic pain.  Musculoskeletal: Positive for back pain and arthralgias. Negative for joint swelling, gait problem, neck pain and neck stiffness.   Skin: Negative.  Negative for pallor and rash.  Neurological: Negative for dizziness, tremors, seizures, syncope, speech difficulty, weakness, numbness and headaches.  Hematological: Negative for adenopathy. Does not bruise/bleed easily.  Psychiatric/Behavioral: Negative for suicidal ideas, behavioral problems, confusion, sleep disturbance, dysphoric mood and decreased concentration. The patient is not nervous/anxious.        Objective:   Physical Exam  Constitutional: She appears well-developed and well-nourished. No distress.  HENT:  Head: Normocephalic.  Right Ear: External ear normal.  Left Ear: External ear normal.  Nose: Nose normal.  Mouth/Throat: Oropharynx is clear and moist.  Eyes: Conjunctivae are normal. Pupils are equal, round, and reactive to light. Right eye exhibits no discharge. Left eye exhibits no discharge.  Neck: Normal range of motion. Neck supple. No JVD present. No tracheal deviation present. No thyromegaly present.  Cardiovascular: Normal rate, regular rhythm and normal heart sounds.   Pulmonary/Chest: No stridor. No respiratory distress. She has no wheezes.  Abdominal: Soft. Bowel sounds are normal. She exhibits no distension and no mass. There is no tenderness. There is no rebound and no guarding.  Musculoskeletal: She exhibits tenderness (over muscles). She exhibits no edema.  Lymphadenopathy:    She has no cervical adenopathy.  Neurological: She displays normal reflexes. No cranial nerve deficit. She exhibits normal muscle tone. Coordination normal.  Skin: No rash noted. No erythema.  Psychiatric: She has a normal mood and affect. Her behavior is normal. Judgment and thought content normal.  1/6 murmur AK on nose    Lab Results  Component Value Date   WBC 6.1 01/16/2013   HGB 12.7 01/16/2013   HCT 37.3 01/16/2013   PLT 333.0 01/16/2013   GLUCOSE 102* 11/06/2014   CHOL 198 11/06/2014  TRIG 102.0 11/06/2014   HDL 69.30 11/06/2014   LDLDIRECT  125.6 01/16/2013   LDLCALC 108* 11/06/2014   ALT 14 11/06/2014   AST 25 11/06/2014   NA 135 11/06/2014   K 3.5 11/06/2014   CL 99 11/06/2014   CREATININE 1.21* 11/06/2014   BUN 24* 11/06/2014   CO2 31 11/06/2014   TSH 2.28 06/28/2014   HGBA1C 6.4 11/06/2014    Procedure Note :     Procedure : Cryosurgery   Indication:   Actinic keratosis(es)   Risks including unsuccessful procedure , bleeding, infection, bruising, scar, a need for a repeat  procedure and others were explained to the patient in detail as well as the benefits. Informed consent was obtained verbally.    1 lesion(s)  on nose   was/were treated with liquid nitrogen on a Q-tip in a usual fasion . Band-Aid was applied and antibiotic ointment was given for a later use.   Tolerated well. Complications none.   Postprocedure instructions :     Keep the wounds clean. You can wash them with liquid soap and water. Pat dry with gauze or a Kleenex tissue  Before applying antibiotic ointment and a Band-Aid.   You need to report immediately  if  any signs of infection develop.       Assessment & Plan:

## 2015-02-01 NOTE — Assessment & Plan Note (Signed)

## 2015-02-01 NOTE — Progress Notes (Signed)
Pre visit review using our clinic review tool, if applicable. No additional management support is needed unless otherwise documented below in the visit note. 

## 2015-02-01 NOTE — Assessment & Plan Note (Signed)
See procedure 

## 2015-02-01 NOTE — Patient Instructions (Signed)
Postprocedure instructions :     Keep the wounds clean. You can wash them with liquid soap and water. Pat dry with gauze or a Kleenex tissue  Before applying antibiotic ointment and a Band-Aid.   You need to report immediately  if  any signs of infection develop.     Preventive Care for Adults A healthy lifestyle and preventive care can promote health and wellness. Preventive health guidelines for women include the following key practices.  A routine yearly physical is a good way to check with your health care provider about your health and preventive screening. It is a chance to share any concerns and updates on your health and to receive a thorough exam.  Visit your dentist for a routine exam and preventive care every 6 months. Brush your teeth twice a day and floss once a day. Good oral hygiene prevents tooth decay and gum disease.  The frequency of eye exams is based on your age, health, family medical history, use of contact lenses, and other factors. Follow your health care provider's recommendations for frequency of eye exams.  Eat a healthy diet. Foods like vegetables, fruits, whole grains, low-fat dairy products, and lean protein foods contain the nutrients you need without too many calories. Decrease your intake of foods high in solid fats, added sugars, and salt. Eat the right amount of calories for you.Get information about a proper diet from your health care provider, if necessary.  Regular physical exercise is one of the most important things you can do for your health. Most adults should get at least 150 minutes of moderate-intensity exercise (any activity that increases your heart rate and causes you to sweat) each week. In addition, most adults need muscle-strengthening exercises on 2 or more days a week.  Maintain a healthy weight. The body mass index (BMI) is a screening tool to identify possible weight problems. It provides an estimate of body fat based on height and  weight. Your health care provider can find your BMI and can help you achieve or maintain a healthy weight.For adults 20 years and older:  A BMI below 18.5 is considered underweight.  A BMI of 18.5 to 24.9 is normal.  A BMI of 25 to 29.9 is considered overweight.  A BMI of 30 and above is considered obese.  Maintain normal blood lipids and cholesterol levels by exercising and minimizing your intake of saturated fat. Eat a balanced diet with plenty of fruit and vegetables. Blood tests for lipids and cholesterol should begin at age 52 and be repeated every 5 years. If your lipid or cholesterol levels are high, you are over 50, or you are at high risk for heart disease, you may need your cholesterol levels checked more frequently.Ongoing high lipid and cholesterol levels should be treated with medicines if diet and exercise are not working.  If you smoke, find out from your health care provider how to quit. If you do not use tobacco, do not start.  Lung cancer screening is recommended for adults aged 65-80 years who are at high risk for developing lung cancer because of a history of smoking. A yearly low-dose CT scan of the lungs is recommended for people who have at least a 30-pack-year history of smoking and are a current smoker or have quit within the past 15 years. A pack year of smoking is smoking an average of 1 pack of cigarettes a day for 1 year (for example: 1 pack a day for 30 years or 2  packs a day for 15 years). Yearly screening should continue until the smoker has stopped smoking for at least 15 years. Yearly screening should be stopped for people who develop a health problem that would prevent them from having lung cancer treatment.  If you are pregnant, do not drink alcohol. If you are breastfeeding, be very cautious about drinking alcohol. If you are not pregnant and choose to drink alcohol, do not have more than 1 drink per day. One drink is considered to be 12 ounces (355 mL) of beer,  5 ounces (148 mL) of wine, or 1.5 ounces (44 mL) of liquor.  Avoid use of street drugs. Do not share needles with anyone. Ask for help if you need support or instructions about stopping the use of drugs.  High blood pressure causes heart disease and increases the risk of stroke. Your blood pressure should be checked at least every 1 to 2 years. Ongoing high blood pressure should be treated with medicines if weight loss and exercise do not work.  If you are 68-93 years old, ask your health care provider if you should take aspirin to prevent strokes.  Diabetes screening involves taking a blood sample to check your fasting blood sugar level. This should be done once every 3 years, after age 64, if you are within normal weight and without risk factors for diabetes. Testing should be considered at a younger age or be carried out more frequently if you are overweight and have at least 1 risk factor for diabetes.  Breast cancer screening is essential preventive care for women. You should practice "breast self-awareness." This means understanding the normal appearance and feel of your breasts and may include breast self-examination. Any changes detected, no matter how small, should be reported to a health care provider. Women in their 2s and 30s should have a clinical breast exam (CBE) by a health care provider as part of a regular health exam every 1 to 3 years. After age 52, women should have a CBE every year. Starting at age 105, women should consider having a mammogram (breast X-ray test) every year. Women who have a family history of breast cancer should talk to their health care provider about genetic screening. Women at a high risk of breast cancer should talk to their health care providers about having an MRI and a mammogram every year.  Breast cancer gene (BRCA)-related cancer risk assessment is recommended for women who have family members with BRCA-related cancers. BRCA-related cancers include breast,  ovarian, tubal, and peritoneal cancers. Having family members with these cancers may be associated with an increased risk for harmful changes (mutations) in the breast cancer genes BRCA1 and BRCA2. Results of the assessment will determine the need for genetic counseling and BRCA1 and BRCA2 testing.  Routine pelvic exams to screen for cancer are no longer recommended for nonpregnant women who are considered low risk for cancer of the pelvic organs (ovaries, uterus, and vagina) and who do not have symptoms. Ask your health care provider if a screening pelvic exam is right for you.  If you have had past treatment for cervical cancer or a condition that could lead to cancer, you need Pap tests and screening for cancer for at least 20 years after your treatment. If Pap tests have been discontinued, your risk factors (such as having a new sexual partner) need to be reassessed to determine if screening should be resumed. Some women have medical problems that increase the chance of getting cervical cancer. In  these cases, your health care provider may recommend more frequent screening and Pap tests.  The HPV test is an additional test that may be used for cervical cancer screening. The HPV test looks for the virus that can cause the cell changes on the cervix. The cells collected during the Pap test can be tested for HPV. The HPV test could be used to screen women aged 34 years and older, and should be used in women of any age who have unclear Pap test results. After the age of 74, women should have HPV testing at the same frequency as a Pap test.  Colorectal cancer can be detected and often prevented. Most routine colorectal cancer screening begins at the age of 34 years and continues through age 33 years. However, your health care provider may recommend screening at an earlier age if you have risk factors for colon cancer. On a yearly basis, your health care provider may provide home test kits to check for hidden  blood in the stool. Use of a small camera at the end of a tube, to directly examine the colon (sigmoidoscopy or colonoscopy), can detect the earliest forms of colorectal cancer. Talk to your health care provider about this at age 59, when routine screening begins. Direct exam of the colon should be repeated every 5-10 years through age 104 years, unless early forms of pre-cancerous polyps or small growths are found.  People who are at an increased risk for hepatitis B should be screened for this virus. You are considered at high risk for hepatitis B if:  You were born in a country where hepatitis B occurs often. Talk with your health care provider about which countries are considered high risk.  Your parents were born in a high-risk country and you have not received a shot to protect against hepatitis B (hepatitis B vaccine).  You have HIV or AIDS.  You use needles to inject street drugs.  You live with, or have sex with, someone who has hepatitis B.  You get hemodialysis treatment.  You take certain medicines for conditions like cancer, organ transplantation, and autoimmune conditions.  Hepatitis C blood testing is recommended for all people born from [birth year] through 1963 and any individual with known risks for hepatitis C.  Practice safe sex. Use condoms and avoid high-risk sexual practices to reduce the spread of sexually transmitted infections (STIs). STIs include gonorrhea, chlamydia, syphilis, trichomonas, herpes, HPV, and human immunodeficiency virus (HIV). Herpes, HIV, and HPV are viral illnesses that have no cure. They can result in disability, cancer, and death.  You should be screened for sexually transmitted illnesses (STIs) including gonorrhea and chlamydia if:  You are sexually active and are younger than 24 years.  You are older than 24 years and your health care provider tells you that you are at risk for this type of infection.  Your sexual activity has changed since you  were last screened and you are at an increased risk for chlamydia or gonorrhea. Ask your health care provider if you are at risk.  If you are at risk of being infected with HIV, it is recommended that you take a prescription medicine daily to prevent HIV infection. This is called preexposure prophylaxis (PrEP). You are considered at risk if:  You are a heterosexual woman, are sexually active, and are at increased risk for HIV infection.  You take drugs by injection.  You are sexually active with a partner who has HIV.  Talk with your health care  provider about whether you are at high risk of being infected with HIV. If you choose to begin PrEP, you should first be tested for HIV. You should then be tested every 3 months for as long as you are taking PrEP.  Osteoporosis is a disease in which the bones lose minerals and strength with aging. This can result in serious bone fractures or breaks. The risk of osteoporosis can be identified using a bone density scan. Women ages 13 years and over and women at risk for fractures or osteoporosis should discuss screening with their health care providers. Ask your health care provider whether you should take a calcium supplement or vitamin D to reduce the rate of osteoporosis.  Menopause can be associated with physical symptoms and risks. Hormone replacement therapy is available to decrease symptoms and risks. You should talk to your health care provider about whether hormone replacement therapy is right for you.  Use sunscreen. Apply sunscreen liberally and repeatedly throughout the day. You should seek shade when your shadow is shorter than you. Protect yourself by wearing long sleeves, pants, a wide-brimmed hat, and sunglasses year round, whenever you are outdoors.  Once a month, do a whole body skin exam, using a mirror to look at the skin on your back. Tell your health care provider of new moles, moles that have irregular borders, moles that are larger  than a pencil eraser, or moles that have changed in shape or color.  Stay current with required vaccines (immunizations).  Influenza vaccine. All adults should be immunized every year.  Tetanus, diphtheria, and acellular pertussis (Td, Tdap) vaccine. Pregnant women should receive 1 dose of Tdap vaccine during each pregnancy. The dose should be obtained regardless of the length of time since the last dose. Immunization is preferred during the 27th-36th week of gestation. An adult who has not previously received Tdap or who does not know her vaccine status should receive 1 dose of Tdap. This initial dose should be followed by tetanus and diphtheria toxoids (Td) booster doses every 10 years. Adults with an unknown or incomplete history of completing a 3-dose immunization series with Td-containing vaccines should begin or complete a primary immunization series including a Tdap dose. Adults should receive a Td booster every 10 years.  Varicella vaccine. An adult without evidence of immunity to varicella should receive 2 doses or a second dose if she has previously received 1 dose. Pregnant females who do not have evidence of immunity should receive the first dose after pregnancy. This first dose should be obtained before leaving the health care facility. The second dose should be obtained 4-8 weeks after the first dose.  Human papillomavirus (HPV) vaccine. Females aged 13-26 years who have not received the vaccine previously should obtain the 3-dose series. The vaccine is not recommended for use in pregnant females. However, pregnancy testing is not needed before receiving a dose. If a female is found to be pregnant after receiving a dose, no treatment is needed. In that case, the remaining doses should be delayed until after the pregnancy. Immunization is recommended for any person with an immunocompromised condition through the age of 33 years if she did not get any or all doses earlier. During the 3-dose  series, the second dose should be obtained 4-8 weeks after the first dose. The third dose should be obtained 24 weeks after the first dose and 16 weeks after the second dose.  Zoster vaccine. One dose is recommended for adults aged 76 years or older  unless certain conditions are present.  Measles, mumps, and rubella (MMR) vaccine. Adults born before [birth year] generally are considered immune to measles and mumps. Adults born in [birth year] or later should have 1 or more doses of MMR vaccine unless there is a contraindication to the vaccine or there is laboratory evidence of immunity to each of the three diseases. A routine second dose of MMR vaccine should be obtained at least 28 days after the first dose for students attending postsecondary schools, health care workers, or international travelers. People who received inactivated measles vaccine or an unknown type of measles vaccine during 1963-1967 should receive 2 doses of MMR vaccine. People who received inactivated mumps vaccine or an unknown type of mumps vaccine before 1979 and are at high risk for mumps infection should consider immunization with 2 doses of MMR vaccine. For females of childbearing age, rubella immunity should be determined. If there is no evidence of immunity, females who are not pregnant should be vaccinated. If there is no evidence of immunity, females who are pregnant should delay immunization until after pregnancy. Unvaccinated health care workers born before [birth year] who lack laboratory evidence of measles, mumps, or rubella immunity or laboratory confirmation of disease should consider measles and mumps immunization with 2 doses of MMR vaccine or rubella immunization with 1 dose of MMR vaccine.  Pneumococcal 13-valent conjugate (PCV13) vaccine. When indicated, a person who is uncertain of her immunization history and has no record of immunization should receive the PCV13 vaccine. An adult aged 42 years or older who has certain medical conditions  and has not been previously immunized should receive 1 dose of PCV13 vaccine. This PCV13 should be followed with a dose of pneumococcal polysaccharide (PPSV23) vaccine. The PPSV23 vaccine dose should be obtained at least 8 weeks after the dose of PCV13 vaccine. An adult aged 1 years or older who has certain medical conditions and previously received 1 or more doses of PPSV23 vaccine should receive 1 dose of PCV13. The PCV13 vaccine dose should be obtained 1 or more years after the last PPSV23 vaccine dose.  Pneumococcal polysaccharide (PPSV23) vaccine. When PCV13 is also indicated, PCV13 should be obtained first. All adults aged 64 years and older should be immunized. An adult younger than age 63 years who has certain medical conditions should be immunized. Any person who resides in a nursing home or long-term care facility should be immunized. An adult smoker should be immunized. People with an immunocompromised condition and certain other conditions should receive both PCV13 and PPSV23 vaccines. People with human immunodeficiency virus (HIV) infection should be immunized as soon as possible after diagnosis. Immunization during chemotherapy or radiation therapy should be avoided. Routine use of PPSV23 vaccine is not recommended for American Indians, [place] Natives, or people younger than 65 years unless there are medical conditions that require PPSV23 vaccine. When indicated, people who have unknown immunization and have no record of immunization should receive PPSV23 vaccine. One-time revaccination 5 years after the first dose of PPSV23 is recommended for people aged 19-64 years who have chronic kidney failure, nephrotic syndrome, asplenia, or immunocompromised conditions. People who received 1-2 doses of PPSV23 before age 40 years should receive another dose of PPSV23 vaccine at age 47 years or later if at least 5 years have passed since the previous dose. Doses of PPSV23 are not needed for people immunized  with PPSV23 at or after age 17 years.  Meningococcal vaccine. Adults with asplenia or persistent complement component deficiencies should receive 2 doses  of quadrivalent meningococcal conjugate (MenACWY-D) vaccine. The doses should be obtained at least 2 months apart. Microbiologists working with certain meningococcal bacteria, [employer] recruits, people at risk during an outbreak, and people who travel to or live in countries with a high rate of meningitis should be immunized. A first-year college student up through age 34 years who is living in a residence hall should receive a dose if she did not receive a dose on or after her 16th birthday. Adults who have certain high-risk conditions should receive one or more doses of vaccine.  Hepatitis A vaccine. Adults who wish to be protected from this disease, have certain high-risk conditions, work with hepatitis A-infected animals, work in hepatitis A research labs, or travel to or work in countries with a high rate of hepatitis A should be immunized. Adults who were previously unvaccinated and who anticipate close contact with an international adoptee during the first 60 days after arrival in the [place] from a country with a high rate of hepatitis A should be immunized.  Hepatitis B vaccine. Adults who wish to be protected from this disease, have certain high-risk conditions, may be exposed to blood or other infectious body fluids, are household contacts or sex partners of hepatitis B positive people, are clients or workers in certain care facilities, or travel to or work in countries with a high rate of hepatitis B should be immunized.  Haemophilus influenzae type b (Hib) vaccine. A previously unvaccinated person with asplenia or sickle cell disease or having a scheduled splenectomy should receive 1 dose of Hib vaccine. Regardless of previous immunization, a recipient of a hematopoietic stem cell transplant should receive a 3-dose series 6-12 months  after her successful transplant. Hib vaccine is not recommended for adults with HIV infection. Preventive Services / Frequency Ages 56 to 18 years  Blood pressure check.** / Every 1 to 2 years.  Lipid and cholesterol check.** / Every 5 years beginning at age 21.  Clinical breast exam.** / Every 3 years for women in their 58s and 26s.  BRCA-related cancer risk assessment.** / For women who have family members with a BRCA-related cancer (breast, ovarian, tubal, or peritoneal cancers).  Pap test.** / Every 2 years from ages 45 through 65. Every 3 years starting at age 39 through age 22 or 87 with a history of 3 consecutive normal Pap tests.  HPV screening.** / Every 3 years from ages 9 through ages 8 to 46 with a history of 3 consecutive normal Pap tests.  Hepatitis C blood test.** / For any individual with known risks for hepatitis C.  Skin self-exam. / Monthly.  Influenza vaccine. / Every year.  Tetanus, diphtheria, and acellular pertussis (Tdap, Td) vaccine.** / Consult your health care provider. Pregnant women should receive 1 dose of Tdap vaccine during each pregnancy. 1 dose of Td every 10 years.  Varicella vaccine.** / Consult your health care provider. Pregnant females who do not have evidence of immunity should receive the first dose after pregnancy.  HPV vaccine. / 3 doses over 6 months, if 82 and younger. The vaccine is not recommended for use in pregnant females. However, pregnancy testing is not needed before receiving a dose.  Measles, mumps, rubella (MMR) vaccine.** / You need at least 1 dose of MMR if you were born in 1957 or later. You may also need a 2nd dose. For females of childbearing age, rubella immunity should be determined. If there is no evidence of immunity, females who are not pregnant  should be vaccinated. If there is no evidence of immunity, females who are pregnant should delay immunization until after pregnancy.  Pneumococcal 13-valent conjugate (PCV13)  vaccine.** / Consult your health care provider.  Pneumococcal polysaccharide (PPSV23) vaccine.** / 1 to 2 doses if you smoke cigarettes or if you have certain conditions.  Meningococcal vaccine.** / 1 dose if you are age 109 to 85 years and a Market researcher living in a residence hall, or have one of several medical conditions, you need to get vaccinated against meningococcal disease. You may also need additional booster doses.  Hepatitis A vaccine.** / Consult your health care provider.  Hepatitis B vaccine.** / Consult your health care provider.  Haemophilus influenzae type b (Hib) vaccine.** / Consult your health care provider. Ages 80 to 21 years  Blood pressure check.** / Every 1 to 2 years.  Lipid and cholesterol check.** / Every 5 years beginning at age 58 years.  Lung cancer screening. / Every year if you are aged 94-80 years and have a 30-pack-year history of smoking and currently smoke or have quit within the past 15 years. Yearly screening is stopped once you have quit smoking for at least 15 years or develop a health problem that would prevent you from having lung cancer treatment.  Clinical breast exam.** / Every year after age 27 years.  BRCA-related cancer risk assessment.** / For women who have family members with a BRCA-related cancer (breast, ovarian, tubal, or peritoneal cancers).  Mammogram.** / Every year beginning at age 46 years and continuing for as long as you are in good health. Consult with your health care provider.  Pap test.** / Every 3 years starting at age 47 years through age 37 or 57 years with a history of 3 consecutive normal Pap tests.  HPV screening.** / Every 3 years from ages 72 years through ages 81 to 60 years with a history of 3 consecutive normal Pap tests.  Fecal occult blood test (FOBT) of stool. / Every year beginning at age 78 years and continuing until age 69 years. You may not need to do this test if you get a colonoscopy every  10 years.  Flexible sigmoidoscopy or colonoscopy.** / Every 5 years for a flexible sigmoidoscopy or every 10 years for a colonoscopy beginning at age 5 years and continuing until age 2 years.  Hepatitis C blood test.** / For all people born from [birth year] through 1963 and any individual with known risks for hepatitis C.  Skin self-exam. / Monthly.  Influenza vaccine. / Every year.  Tetanus, diphtheria, and acellular pertussis (Tdap/Td) vaccine.** / Consult your health care provider. Pregnant women should receive 1 dose of Tdap vaccine during each pregnancy. 1 dose of Td every 10 years.  Varicella vaccine.** / Consult your health care provider. Pregnant females who do not have evidence of immunity should receive the first dose after pregnancy.  Zoster vaccine.** / 1 dose for adults aged 21 years or older.  Measles, mumps, rubella (MMR) vaccine.** / You need at least 1 dose of MMR if you were born in 1955 or later. You may also need a 2nd dose. For females of childbearing age, rubella immunity should be determined. If there is no evidence of immunity, females who are not pregnant should be vaccinated. If there is no evidence of immunity, females who are pregnant should delay immunization until after pregnancy.  Pneumococcal 13-valent conjugate (PCV13) vaccine.** / Consult your health care provider.  Pneumococcal polysaccharide (PPSV23) vaccine.** / 1 to  2 doses if you smoke cigarettes or if you have certain conditions.  Meningococcal vaccine.** / Consult your health care provider.  Hepatitis A vaccine.** / Consult your health care provider.  Hepatitis B vaccine.** / Consult your health care provider.  Haemophilus influenzae type b (Hib) vaccine.** / Consult your health care provider. Ages 85 years and over  Blood pressure check.** / Every 1 to 2 years.  Lipid and cholesterol check.** / Every 5 years beginning at age 78 years.  Lung cancer screening. / Every year if you are aged 32-80  years and have a 30-pack-year history of smoking and currently smoke or have quit within the past 15 years. Yearly screening is stopped once you have quit smoking for at least 15 years or develop a health problem that would prevent you from having lung cancer treatment.  Clinical breast exam.** / Every year after age 34 years.  BRCA-related cancer risk assessment.** / For women who have family members with a BRCA-related cancer (breast, ovarian, tubal, or peritoneal cancers).  Mammogram.** / Every year beginning at age 24 years and continuing for as long as you are in good health. Consult with your health care provider.  Pap test.** / Every 3 years starting at age 52 years through age 33 or 109 years with 3 consecutive normal Pap tests. Testing can be stopped between 65 and 70 years with 3 consecutive normal Pap tests and no abnormal Pap or HPV tests in the past 10 years.  HPV screening.** / Every 3 years from ages 50 years through ages 3 or 17 years with a history of 3 consecutive normal Pap tests. Testing can be stopped between 65 and 70 years with 3 consecutive normal Pap tests and no abnormal Pap or HPV tests in the past 10 years.  Fecal occult blood test (FOBT) of stool. / Every year beginning at age 33 years and continuing until age 54 years. You may not need to do this test if you get a colonoscopy every 10 years.  Flexible sigmoidoscopy or colonoscopy.** / Every 5 years for a flexible sigmoidoscopy or every 10 years for a colonoscopy beginning at age 99 years and continuing until age 33 years.  Hepatitis C blood test.** / For all people born from [birth year] through 1963 and any individual with known risks for hepatitis C.  Osteoporosis screening.** / A one-time screening for women ages 4 years and over and women at risk for fractures or osteoporosis.  Skin self-exam. / Monthly.  Influenza vaccine. / Every year.  Tetanus, diphtheria, and acellular pertussis (Tdap/Td) vaccine.** / 1 dose of  Td every 10 years.  Varicella vaccine.** / Consult your health care provider.  Zoster vaccine.** / 1 dose for adults aged 85 years or older.  Pneumococcal 13-valent conjugate (PCV13) vaccine.** / Consult your health care provider.  Pneumococcal polysaccharide (PPSV23) vaccine.** / 1 dose for all adults aged 14 years and older.  Meningococcal vaccine.** / Consult your health care provider.  Hepatitis A vaccine.** / Consult your health care provider.  Hepatitis B vaccine.** / Consult your health care provider.  Haemophilus influenzae type b (Hib) vaccine.** / Consult your health care provider. ** Family history and personal history of risk and conditions may change your health care provider's recommendations. Document Released: 10/27/2001 Document Revised: 01/15/2014 Document Reviewed: 01/26/2011 [facility] Patient Information 2015 [place], [place]. This information is not intended to replace advice given to you by your health care provider. Make sure you discuss any questions you have with your health  care provider.

## 2015-02-01 NOTE — Assessment & Plan Note (Signed)
Chronic White coat HTN Atenolol, Amlodipine, Maxzide

## 2015-02-01 NOTE — Assessment & Plan Note (Signed)
Chronic  ?surgery

## 2015-02-01 NOTE — Assessment & Plan Note (Signed)
Labs

## 2015-02-01 NOTE — Assessment & Plan Note (Signed)
Chronic  On Crestor 

## 2015-02-08 ENCOUNTER — Other Ambulatory Visit (INDEPENDENT_AMBULATORY_CARE_PROVIDER_SITE_OTHER): Payer: Medicare Other

## 2015-02-08 DIAGNOSIS — Z0000 Encounter for general adult medical examination without abnormal findings: Secondary | ICD-10-CM

## 2015-02-08 DIAGNOSIS — H269 Unspecified cataract: Secondary | ICD-10-CM | POA: Diagnosis not present

## 2015-02-08 DIAGNOSIS — E785 Hyperlipidemia, unspecified: Secondary | ICD-10-CM | POA: Diagnosis not present

## 2015-02-08 DIAGNOSIS — L570 Actinic keratosis: Secondary | ICD-10-CM

## 2015-02-08 DIAGNOSIS — I10 Essential (primary) hypertension: Secondary | ICD-10-CM | POA: Diagnosis not present

## 2015-02-08 LAB — URINALYSIS, ROUTINE W REFLEX MICROSCOPIC
Bilirubin Urine: NEGATIVE
Ketones, ur: NEGATIVE
Nitrite: NEGATIVE
Specific Gravity, Urine: 1.01 (ref 1.000–1.030)
Total Protein, Urine: NEGATIVE
Urine Glucose: NEGATIVE
Urobilinogen, UA: 0.2 (ref 0.0–1.0)
pH: 6 (ref 5.0–8.0)

## 2015-02-08 LAB — BASIC METABOLIC PANEL
BUN: 28 mg/dL — ABNORMAL HIGH (ref 6–23)
CO2: 28 mEq/L (ref 19–32)
Calcium: 9.4 mg/dL (ref 8.4–10.5)
Chloride: 98 mEq/L (ref 96–112)
Creatinine, Ser: 1.3 mg/dL — ABNORMAL HIGH (ref 0.40–1.20)
GFR: 41.68 mL/min — ABNORMAL LOW (ref >60.00)
Glucose, Bld: 104 mg/dL — ABNORMAL HIGH (ref 70–99)
Potassium: 3.5 mEq/L (ref 3.5–5.1)
Sodium: 135 mEq/L (ref 135–145)

## 2015-02-08 LAB — CBC WITH DIFFERENTIAL/PLATELET
Basophils Absolute: 0 10*3/uL (ref 0.0–0.1)
Basophils Relative: 0.5 % (ref 0.0–3.0)
Eosinophils Absolute: 0.2 10*3/uL (ref 0.0–0.7)
Eosinophils Relative: 3 % (ref 0.0–5.0)
HCT: 37.3 % (ref 36.0–46.0)
Hemoglobin: 12.6 g/dL (ref 12.0–15.0)
Lymphocytes Relative: 41.7 % (ref 12.0–46.0)
Lymphs Abs: 2.4 10*3/uL (ref 0.7–4.0)
MCHC: 33.7 g/dL (ref 30.0–36.0)
MCV: 84 fl (ref 78.0–100.0)
Monocytes Absolute: 0.6 10*3/uL (ref 0.1–1.0)
Monocytes Relative: 11.1 % (ref 3.0–12.0)
Neutro Abs: 2.5 10*3/uL (ref 1.4–7.7)
Neutrophils Relative %: 43.7 % (ref 43.0–77.0)
Platelets: 311 10*3/uL (ref 150.0–400.0)
RBC: 4.44 Mil/uL (ref 3.87–5.11)
RDW: 15.2 % (ref 11.5–15.5)
WBC: 5.7 10*3/uL (ref 4.0–10.5)

## 2015-02-08 LAB — LIPID PANEL
Cholesterol: 242 mg/dL — ABNORMAL HIGH (ref 0–200)
HDL: 66.1 mg/dL (ref >39.00)
LDL Cholesterol: 155 mg/dL — ABNORMAL HIGH (ref 0–99)
NonHDL: 175.9
Total CHOL/HDL Ratio: 4
Triglycerides: 107 mg/dL (ref 0.0–149.0)
VLDL: 21.4 mg/dL (ref 0.0–40.0)

## 2015-02-08 LAB — HEPATIC FUNCTION PANEL
ALT: 10 U/L (ref 0–35)
AST: 21 U/L (ref 0–37)
Albumin: 4.2 g/dL (ref 3.5–5.2)
Alkaline Phosphatase: 43 U/L (ref 39–117)
Bilirubin, Direct: 0.1 mg/dL (ref 0.0–0.3)
Total Bilirubin: 0.5 mg/dL (ref 0.2–1.2)
Total Protein: 7 g/dL (ref 6.0–8.3)

## 2015-02-08 LAB — TSH: TSH: 3.33 u[IU]/mL (ref 0.35–4.50)

## 2015-02-12 ENCOUNTER — Telehealth: Payer: Self-pay | Admitting: Internal Medicine

## 2015-02-12 NOTE — Telephone Encounter (Signed)
Patient returned your call. Please call her today

## 2015-02-12 NOTE — Telephone Encounter (Signed)
Pt informed of below. She denies dysuria, urinary frequency. However, she states "more water comes out". She has a pessary for her bladder prolapse.  Please advise.  Notes Recorded by Merrilyn PumaStacey N Marciano Mundt, CMA on 02/12/2015 at 3:35 PM Left mess for patient to call back. Notes Recorded by Tresa GarterAleksei Plotnikov V, MD on 02/09/2015 at 9:35 AM Misty StanleyStacey, please, inform patient that all labs are stable, except for elev lipids - re-start Crestor. Any urinary sx's? Thx

## 2015-02-13 NOTE — Telephone Encounter (Signed)
Noted. Annice PihJackie needs to f/u w/her GYN, pls Thx

## 2015-02-13 NOTE — Telephone Encounter (Signed)
Pt informed

## 2015-02-14 ENCOUNTER — Telehealth: Payer: Self-pay | Admitting: Internal Medicine

## 2015-02-14 NOTE — Telephone Encounter (Signed)
Patient is requesting a recommendation to an obgyn. She was using [facility], but they dio not have an appt until the end of June. She states that she needs one before then. I attempted to give her some alternates such as [facility], but she insisted that she wanted a recommendation from dr plotnikov.

## 2015-02-15 NOTE — Telephone Encounter (Signed)
Dr Valentina Shaggy Lowe Thx

## 2015-02-15 NOTE — Telephone Encounter (Signed)
Advised patient

## 2015-02-20 DIAGNOSIS — N760 Acute vaginitis: Secondary | ICD-10-CM | POA: Diagnosis not present

## 2015-02-20 DIAGNOSIS — Z124 Encounter for screening for malignant neoplasm of cervix: Secondary | ICD-10-CM | POA: Diagnosis not present

## 2015-03-06 ENCOUNTER — Telehealth: Payer: Self-pay

## 2015-03-06 ENCOUNTER — Other Ambulatory Visit: Payer: Self-pay | Admitting: Internal Medicine

## 2015-03-06 ENCOUNTER — Other Ambulatory Visit: Payer: Self-pay

## 2015-03-06 MED ORDER — AMLODIPINE BESYLATE 5 MG PO TABS: 5.0000 mg | ORAL_TABLET | Freq: Every day | ORAL | Status: DC

## 2015-03-06 MED ORDER — ATENOLOL 100 MG PO TABS: 50.0000 mg | ORAL_TABLET | Freq: Two times a day (BID) | ORAL | Status: DC

## 2015-03-06 MED ORDER — TRIAMTERENE-HCTZ 75-50 MG PO TABS: 0.5000 | ORAL_TABLET | Freq: Every day | ORAL | Status: DC

## 2015-03-06 NOTE — Telephone Encounter (Signed)
Amlodipine and maxide rx sent to pharm

## 2015-03-07 ENCOUNTER — Other Ambulatory Visit: Payer: Self-pay | Admitting: Internal Medicine

## 2015-03-07 ENCOUNTER — Other Ambulatory Visit: Payer: Self-pay | Admitting: *Deleted

## 2015-03-07 DIAGNOSIS — N814 Uterovaginal prolapse, unspecified: Secondary | ICD-10-CM | POA: Diagnosis not present

## 2015-03-07 MED ORDER — LOSARTAN POTASSIUM 100 MG PO TABS
100.0000 mg | ORAL_TABLET | Freq: Every day | ORAL | Status: DC
Start: 2015-03-07 — End: 2016-02-19

## 2015-03-27 DIAGNOSIS — H2513 Age-related nuclear cataract, bilateral: Secondary | ICD-10-CM | POA: Diagnosis not present

## 2015-03-27 DIAGNOSIS — H524 Presbyopia: Secondary | ICD-10-CM | POA: Diagnosis not present

## 2015-06-06 ENCOUNTER — Other Ambulatory Visit: Payer: Self-pay | Admitting: Internal Medicine

## 2015-06-14 ENCOUNTER — Ambulatory Visit (INDEPENDENT_AMBULATORY_CARE_PROVIDER_SITE_OTHER): Payer: Medicare Other

## 2015-06-14 DIAGNOSIS — Z23 Encounter for immunization: Secondary | ICD-10-CM | POA: Diagnosis not present

## 2015-08-06 ENCOUNTER — Ambulatory Visit: Payer: Medicare Other | Admitting: Internal Medicine

## 2015-09-04 ENCOUNTER — Ambulatory Visit (INDEPENDENT_AMBULATORY_CARE_PROVIDER_SITE_OTHER): Payer: Medicare Other | Admitting: Internal Medicine

## 2015-09-04 ENCOUNTER — Encounter: Payer: Self-pay | Admitting: Internal Medicine

## 2015-09-04 VITALS — BP 120/86 | HR 58 | Wt 117.0 lb

## 2015-09-04 DIAGNOSIS — H6123 Impacted cerumen, bilateral: Secondary | ICD-10-CM

## 2015-09-04 DIAGNOSIS — L853 Xerosis cutis: Secondary | ICD-10-CM | POA: Insufficient documentation

## 2015-09-04 DIAGNOSIS — I129 Hypertensive chronic kidney disease with stage 1 through stage 4 chronic kidney disease, or unspecified chronic kidney disease: Secondary | ICD-10-CM | POA: Diagnosis not present

## 2015-09-04 DIAGNOSIS — F411 Generalized anxiety disorder: Secondary | ICD-10-CM

## 2015-09-04 DIAGNOSIS — L850 Acquired ichthyosis: Secondary | ICD-10-CM

## 2015-09-04 DIAGNOSIS — E119 Type 2 diabetes mellitus without complications: Secondary | ICD-10-CM | POA: Diagnosis not present

## 2015-09-04 DIAGNOSIS — E785 Hyperlipidemia, unspecified: Secondary | ICD-10-CM | POA: Diagnosis not present

## 2015-09-04 DIAGNOSIS — H6120 Impacted cerumen, unspecified ear: Secondary | ICD-10-CM | POA: Insufficient documentation

## 2015-09-04 MED ORDER — ROSUVASTATIN CALCIUM 20 MG PO TABS: 20.0000 mg | ORAL_TABLET | Freq: Every day | ORAL | Status: DC

## 2015-09-04 MED ORDER — TRIAMCINOLONE ACETONIDE 0.1 % EX CREA: 1.0000 "application " | TOPICAL_CREAM | Freq: Two times a day (BID) | CUTANEOUS | Status: DC

## 2015-09-04 NOTE — Assessment & Plan Note (Signed)
Chronic  On Crestor 

## 2015-09-04 NOTE — Assessment & Plan Note (Signed)
Chronic  Paxil

## 2015-09-04 NOTE — Assessment & Plan Note (Signed)
Triamc 0.1% Cream bid

## 2015-09-04 NOTE — Patient Instructions (Signed)
Eucerin lotion

## 2015-09-04 NOTE — Progress Notes (Signed)
Subjective:  Patient ID: Vickie CallerJackie M Balli, female    DOB: 02/01/1931  Age: 81 y.o. MRN: [id number]  CC: No chief complaint on file.   HPI Vickie Wilson presents for HTN, dyslipidemia, anxiety f/u  Outpatient Prescriptions Prior to Visit  Medication Sig Dispense Refill  . amLODipine (NORVASC) 5 MG tablet Take 1 tablet (5 mg total) by mouth daily. 90 tablet 1  . atenolol (TENORMIN) 100 MG tablet Take 0.5 tablets (50 mg total) by mouth 2 (two) times daily. 90 tablet 3  . losartan (COZAAR) 100 MG tablet Take 1 tablet (100 mg total) by mouth daily. 90 tablet 3  . Multiple Vitamins-Minerals (CENTRUM SILVER) CHEW Chew 1 each by mouth daily.      Marland Kitchen. PARoxetine (PAXIL) 10 MG tablet TAKE 1 TABLET IN THE MORNING. 90 tablet 1  . rosuvastatin (CRESTOR) 20 MG tablet Take 1 tablet (20 mg total) by mouth daily. 90 tablet 1  . triamterene-hydrochlorothiazide (MAXZIDE) 75-50 MG per tablet Take 0.5 tablets by mouth daily. 45 tablet 1  . aspirin EC 325 MG tablet Take 1 tablet (325 mg total) by mouth daily. 30 tablet 6  . cholecalciferol (VITAMIN D) 1000 UNITS tablet Take 1,000 Units by mouth daily.     No facility-administered medications prior to visit.    ROS Review of Systems  Constitutional: Negative for chills, activity change, appetite change, fatigue and unexpected weight change.  HENT: Negative for congestion, mouth sores and sinus pressure.   Eyes: Negative for visual disturbance.  Respiratory: Negative for cough and chest tightness.   Gastrointestinal: Negative for nausea and abdominal pain.  Genitourinary: Negative for frequency, difficulty urinating and vaginal pain.  Musculoskeletal: Negative for back pain and gait problem.  Skin: Negative for pallor and rash.  Neurological: Negative for dizziness, tremors, weakness, numbness and headaches.  Psychiatric/Behavioral: Negative for confusion and sleep disturbance. The patient is nervous/anxious.     Objective:  BP 120/86 mmHg  Pulse 58   Wt 117 lb (53.071 kg)  SpO2 97%  BP Readings from Last 3 Encounters:  09/04/15 120/86  02/01/15 130/80  10/30/14 144/82    Wt Readings from Last 3 Encounters:  09/04/15 117 lb (53.071 kg)  02/01/15 119 lb (53.978 kg)  10/30/14 122 lb (55.339 kg)    Physical Exam  Constitutional: She appears well-developed. No distress.  HENT:  Head: Normocephalic.  Right Ear: External ear normal.  Left Ear: External ear normal.  Nose: Nose normal.  Mouth/Throat: Oropharynx is clear and moist.  Eyes: Conjunctivae are normal. Pupils are equal, round, and reactive to light. Right eye exhibits no discharge. Left eye exhibits no discharge.  Neck: Normal range of motion. Neck supple. No JVD present. No tracheal deviation present. No thyromegaly present.  Cardiovascular: Normal rate, regular rhythm and normal heart sounds.   Pulmonary/Chest: No stridor. No respiratory distress. She has no wheezes.  Abdominal: Soft. Bowel sounds are normal. She exhibits no distension and no mass. There is no tenderness. There is no rebound and no guarding.  Musculoskeletal: She exhibits no edema or tenderness.  Lymphadenopathy:    She has no cervical adenopathy.  Neurological: She displays normal reflexes. No cranial nerve deficit. She exhibits normal muscle tone. Coordination normal.  Skin: No rash noted. No erythema.  Psychiatric: She has a normal mood and affect. Her behavior is normal. Judgment and thought content normal.  dry skin on legs Wax B ears  Lab Results  Component Value Date   WBC 5.7 02/08/2015  HGB 12.6 02/08/2015   HCT 37.3 02/08/2015   PLT 311.0 02/08/2015   GLUCOSE 104* 02/08/2015   CHOL 242* 02/08/2015   TRIG 107.0 02/08/2015   HDL 66.10 02/08/2015   LDLDIRECT 125.6 01/16/2013   LDLCALC 155* 02/08/2015   ALT 10 02/08/2015   AST 21 02/08/2015   NA 135 02/08/2015   K 3.5 02/08/2015   CL 98 02/08/2015   CREATININE 1.30* 02/08/2015   BUN 28* 02/08/2015   CO2 28 02/08/2015   TSH 3.33  02/08/2015   HGBA1C 6.4 11/06/2014    Mr Card Morphology Wo/w Cm  02/13/2013  Cardiac MRI: Indication RA Mass The patient was scanned on a 1.5 Tesla GE magnet A dedicated cardiac coil was used. Functional imaging was done using Fiesta sequences.  The patient received 20 cc of multihance. Unfortunately she became nauseated and delayed IIR images were suboptimal.  The tech did not do any T2 weighted images Findings: The LA and LV were normal.  Quantitative EF was 69% ( EDV 126, ESV 39 SV 86 ) There was no ASD or VSD.  The aortic tricuspic and mitral vavles appeared normal The was no evidence of SBE.  The LAA was free of thrombus.  The IVC was normal The RV had normal size and function.  The proximal MPA and right and left branches were normal Thee was an ill defined mass in the lower aspect of the lateral RA wall.  It measures 1.9 by 1.5 cm.  The tissue characteristics of the central area were different than the periphery suggesting possible necrosis.  There was no appreciable gadolinium uptake although the study was performed outside the ideal time window for imaging Impression:    1)    RA mass not well characterized by MRI due to lack of T2       weighted images and patient not tolerating gadolinium Fiesta       measurement 1.9 x 1.5 cm.       2)    Normal LV EF 69% 3)    Normal RV 4)    No LAA thrombus 5)    Normal IVC 6)    Normal PA and ascending aorta 7)    Normal TV, AV and MV with no signs of SBE Review of TTE shows lesion much better. Can consider TEE if clinically indicated.  Agree with anticoagulation for intracardiac mass this size given risk of PE Charlton Haws MD [facility] Original Report Authenticated By: Charlton Haws, M.D.    Assessment & Plan:   There are no diagnoses linked to this encounter. I have discontinued Ms. Pigman's aspirin EC. I am also having her maintain her CENTRUM SILVER, cholecalciferol, rosuvastatin, amLODipine, triamterene-hydrochlorothiazide, atenolol, losartan, and  PARoxetine.  No orders of the defined types were placed in this encounter.     Follow-up: No Follow-up on file.  Sonda Primes, MD

## 2015-09-04 NOTE — Assessment & Plan Note (Signed)
White coat HTN Atenolol, Amlodipine, Maxzide

## 2015-09-04 NOTE — Assessment & Plan Note (Signed)
  On diet  

## 2015-09-04 NOTE — Progress Notes (Signed)
Pre visit review using our clinic review tool, if applicable. No additional management support is needed unless otherwise documented below in the visit note. 

## 2015-09-05 ENCOUNTER — Other Ambulatory Visit: Payer: Self-pay | Admitting: Internal Medicine

## 2015-09-05 ENCOUNTER — Other Ambulatory Visit: Payer: Self-pay | Admitting: *Deleted

## 2015-09-05 MED ORDER — ROSUVASTATIN CALCIUM 20 MG PO TABS: 20.0000 mg | ORAL_TABLET | Freq: Every day | ORAL | Status: DC

## 2015-09-05 NOTE — Telephone Encounter (Signed)
Pharmacist left msg on triage stating pt is requesting refill on her Crestor. MD was suppose to send it on yesterday we still haven't received. Sent electronically...[name]/lmb

## 2015-09-06 ENCOUNTER — Other Ambulatory Visit: Payer: Self-pay | Admitting: Internal Medicine

## 2015-09-18 ENCOUNTER — Other Ambulatory Visit (INDEPENDENT_AMBULATORY_CARE_PROVIDER_SITE_OTHER): Payer: Medicare Other

## 2015-09-18 DIAGNOSIS — E119 Type 2 diabetes mellitus without complications: Secondary | ICD-10-CM | POA: Diagnosis not present

## 2015-09-18 DIAGNOSIS — F411 Generalized anxiety disorder: Secondary | ICD-10-CM

## 2015-09-18 DIAGNOSIS — I129 Hypertensive chronic kidney disease with stage 1 through stage 4 chronic kidney disease, or unspecified chronic kidney disease: Secondary | ICD-10-CM

## 2015-09-18 DIAGNOSIS — E785 Hyperlipidemia, unspecified: Secondary | ICD-10-CM

## 2015-09-18 DIAGNOSIS — L850 Acquired ichthyosis: Secondary | ICD-10-CM

## 2015-09-18 DIAGNOSIS — L853 Xerosis cutis: Secondary | ICD-10-CM

## 2015-09-18 LAB — BASIC METABOLIC PANEL
BUN: 32 mg/dL — ABNORMAL HIGH (ref 6–23)
CO2: 28 mEq/L (ref 19–32)
Calcium: 9.7 mg/dL (ref 8.4–10.5)
Chloride: 99 mEq/L (ref 96–112)
Creatinine, Ser: 1.48 mg/dL — ABNORMAL HIGH (ref 0.40–1.20)
GFR: 35.83 mL/min — ABNORMAL LOW (ref >60.00)
Glucose, Bld: 97 mg/dL (ref 70–99)
Potassium: 4.1 mEq/L (ref 3.5–5.1)
Sodium: 137 mEq/L (ref 135–145)

## 2015-09-18 LAB — CBC WITH DIFFERENTIAL/PLATELET
Basophils Absolute: 0 10*3/uL (ref 0.0–0.1)
Basophils Relative: 0.6 % (ref 0.0–3.0)
Eosinophils Absolute: 0.2 10*3/uL (ref 0.0–0.7)
Eosinophils Relative: 4.3 % (ref 0.0–5.0)
HCT: 39.3 % (ref 36.0–46.0)
Hemoglobin: 13.1 g/dL (ref 12.0–15.0)
Lymphocytes Relative: 42.3 % (ref 12.0–46.0)
Lymphs Abs: 2.4 10*3/uL (ref 0.7–4.0)
MCHC: 33.2 g/dL (ref 30.0–36.0)
MCV: 84.1 fl (ref 78.0–100.0)
Monocytes Absolute: 0.6 10*3/uL (ref 0.1–1.0)
Monocytes Relative: 11.1 % (ref 3.0–12.0)
Neutro Abs: 2.4 10*3/uL (ref 1.4–7.7)
Neutrophils Relative %: 41.7 % — ABNORMAL LOW (ref 43.0–77.0)
Platelets: 328 10*3/uL (ref 150.0–400.0)
RBC: 4.68 Mil/uL (ref 3.87–5.11)
RDW: 15.4 % (ref 11.5–15.5)
WBC: 5.7 10*3/uL (ref 4.0–10.5)

## 2015-09-18 LAB — URINALYSIS, ROUTINE W REFLEX MICROSCOPIC
Bilirubin Urine: NEGATIVE
Ketones, ur: NEGATIVE
Nitrite: NEGATIVE
RBC / HPF: NONE SEEN (ref 0–?)
Specific Gravity, Urine: 1.01 (ref 1.000–1.030)
Total Protein, Urine: NEGATIVE
Urine Glucose: NEGATIVE
Urobilinogen, UA: 0.2 (ref 0.0–1.0)
pH: 6 (ref 5.0–8.0)

## 2015-09-18 LAB — HEPATIC FUNCTION PANEL
ALT: 16 U/L (ref 0–35)
AST: 26 U/L (ref 0–37)
Albumin: 4.3 g/dL (ref 3.5–5.2)
Alkaline Phosphatase: 48 U/L (ref 39–117)
Bilirubin, Direct: 0.1 mg/dL (ref 0.0–0.3)
Total Bilirubin: 0.6 mg/dL (ref 0.2–1.2)
Total Protein: 7.1 g/dL (ref 6.0–8.3)

## 2015-09-18 LAB — LIPID PANEL
Cholesterol: 238 mg/dL — ABNORMAL HIGH (ref 0–200)
HDL: 66.1 mg/dL (ref >39.00)
LDL Cholesterol: 155 mg/dL — ABNORMAL HIGH (ref 0–99)
NonHDL: 171.58
Total CHOL/HDL Ratio: 4
Triglycerides: 85 mg/dL (ref 0.0–149.0)
VLDL: 17 mg/dL (ref 0.0–40.0)

## 2015-09-18 LAB — TSH: TSH: 3.52 u[IU]/mL (ref 0.35–4.50)

## 2015-09-18 LAB — HEMOGLOBIN A1C: Hgb A1c MFr Bld: 6.2 % (ref 4.6–6.5)

## 2015-09-19 ENCOUNTER — Telehealth: Payer: Self-pay

## 2015-09-19 NOTE — Telephone Encounter (Signed)
Called patient to give them lab results, unable to reach patient, left message for them to give us a call back. If patient calls back and we are unable to reach please inform them of these lab results.       Misty StanleyStacey, please, inform patient that all labs are stable except for elev lipids - use Crestor. UA is abnormal - no need to treat if no UTI sx's

## 2015-09-20 NOTE — Telephone Encounter (Signed)
Patient called in. Advised of the results. She has further questions regarding the actual meaning of specific UA results. She also wanted to mention that she does not think we are aware that she has a pressory put in several years ago. She is concerned that there is a problem with her kidneys and wishes to seek clarification on labs. Please give the patient a call at the attached number. Thanks!

## 2015-09-24 NOTE — Telephone Encounter (Signed)
UA is abnormal - no need to treat if no UTI sx's. F/u w/MD who placed a pessary  Thx

## 2015-09-25 NOTE — Telephone Encounter (Signed)
I called pt- I answered her questions regarding her recent labs. Pt informed of MDs advisement.

## 2015-10-14 ENCOUNTER — Other Ambulatory Visit: Payer: Self-pay | Admitting: Internal Medicine

## 2015-12-05 ENCOUNTER — Other Ambulatory Visit: Payer: Self-pay | Admitting: Internal Medicine

## 2016-02-19 ENCOUNTER — Other Ambulatory Visit: Payer: Self-pay | Admitting: Internal Medicine

## 2016-03-04 ENCOUNTER — Other Ambulatory Visit (INDEPENDENT_AMBULATORY_CARE_PROVIDER_SITE_OTHER): Payer: Medicare Other

## 2016-03-04 ENCOUNTER — Ambulatory Visit (INDEPENDENT_AMBULATORY_CARE_PROVIDER_SITE_OTHER): Payer: Medicare Other | Admitting: Internal Medicine

## 2016-03-04 ENCOUNTER — Encounter: Payer: Self-pay | Admitting: Internal Medicine

## 2016-03-04 VITALS — BP 124/60 | HR 56 | Wt 121.0 lb

## 2016-03-04 DIAGNOSIS — I129 Hypertensive chronic kidney disease with stage 1 through stage 4 chronic kidney disease, or unspecified chronic kidney disease: Secondary | ICD-10-CM | POA: Diagnosis not present

## 2016-03-04 DIAGNOSIS — F32A Depression, unspecified: Secondary | ICD-10-CM

## 2016-03-04 DIAGNOSIS — F329 Major depressive disorder, single episode, unspecified: Secondary | ICD-10-CM | POA: Diagnosis not present

## 2016-03-04 DIAGNOSIS — E785 Hyperlipidemia, unspecified: Secondary | ICD-10-CM

## 2016-03-04 LAB — BASIC METABOLIC PANEL
BUN: 25 mg/dL — ABNORMAL HIGH (ref 6–23)
CO2: 27 mEq/L (ref 19–32)
Calcium: 9.6 mg/dL (ref 8.4–10.5)
Chloride: 98 mEq/L (ref 96–112)
Creatinine, Ser: 1.18 mg/dL (ref 0.40–1.20)
GFR: 46.48 mL/min — ABNORMAL LOW (ref >60.00)
Glucose, Bld: 95 mg/dL (ref 70–99)
Potassium: 3.5 mEq/L (ref 3.5–5.1)
Sodium: 134 mEq/L — ABNORMAL LOW (ref 135–145)

## 2016-03-04 NOTE — Assessment & Plan Note (Signed)
Paroxetine?

## 2016-03-04 NOTE — Progress Notes (Signed)
Pre visit review using our clinic review tool, if applicable. No additional management support is needed unless otherwise documented below in the visit note. 

## 2016-03-04 NOTE — Assessment & Plan Note (Signed)
Atenolol, Amlodipine, Maxzide

## 2016-03-04 NOTE — Progress Notes (Signed)
Subjective:  Patient ID: Vickie Wilson, female    DOB: 02/01/1931  Age: 82 y.o. MRN: [id number]  CC: No chief complaint on file.   HPI Vickie CallerJackie M Dukeman presents for HTN, dyslipidemia, CRI, CAD f/u  Outpatient Prescriptions Prior to Visit  Medication Sig Dispense Refill  . amLODipine (NORVASC) 5 MG tablet TAKE 1 TABLET ONCE DAILY. 90 tablet 1  . aspirin EC 81 MG tablet Take 81 mg by mouth daily.    Marland Kitchen. atenolol (TENORMIN) 100 MG tablet Take 0.5 tablets (50 mg total) by mouth 2 (two) times daily. Overdue for yearly physical w/labs must see MD for refills 30 tablet 0  . CRESTOR 20 MG tablet TAKE 1 TABLET ONCE DAILY. 30 tablet 5  . losartan (COZAAR) 100 MG tablet Take 1 tablet (100 mg total) by mouth daily. Overdue for yearly physical must see md for  refills 30 tablet 0  . Multiple Vitamins-Minerals (CENTRUM SILVER) CHEW Chew 1 each by mouth daily.      Marland Kitchen. PARoxetine (PAXIL) 10 MG tablet TAKE 1 TABLET IN THE MORNING. 90 tablet 1  . triamcinolone cream (KENALOG) 0.1 % Apply 1 application topically 2 (two) times daily. 45 g 3  . triamterene-hydrochlorothiazide (MAXZIDE) 75-50 MG tablet TAKE (1/2) TABLET DAILY IN THE MORNING. 45 tablet 1  . cholecalciferol (VITAMIN D) 1000 UNITS tablet Take 1,000 Units by mouth daily.     No facility-administered medications prior to visit.    ROS Review of Systems  Constitutional: Negative for chills, activity change, appetite change, fatigue and unexpected weight change.  HENT: Negative for congestion, mouth sores and sinus pressure.   Eyes: Negative for visual disturbance.  Respiratory: Negative for cough and chest tightness.   Gastrointestinal: Negative for nausea and abdominal pain.  Genitourinary: Negative for frequency, difficulty urinating and vaginal pain.  Musculoskeletal: Negative for back pain and gait problem.  Skin: Negative for pallor and rash.  Neurological: Negative for dizziness, tremors, weakness, numbness and headaches.    Psychiatric/Behavioral: Negative for confusion and sleep disturbance.    Objective:  BP 124/60 mmHg  Pulse 56  Wt 121 lb (54.885 kg)  SpO2 96%  BP Readings from Last 3 Encounters:  03/04/16 124/60  09/04/15 120/86  02/01/15 130/80    Wt Readings from Last 3 Encounters:  03/04/16 121 lb (54.885 kg)  09/04/15 117 lb (53.071 kg)  02/01/15 119 lb (53.978 kg)    Physical Exam  Constitutional: She appears well-developed. No distress.  HENT:  Head: Normocephalic.  Right Ear: External ear normal.  Left Ear: External ear normal.  Nose: Nose normal.  Mouth/Throat: Oropharynx is clear and moist.  Eyes: Conjunctivae are normal. Pupils are equal, round, and reactive to light. Right eye exhibits no discharge. Left eye exhibits no discharge.  Neck: Normal range of motion. Neck supple. No JVD present. No tracheal deviation present. No thyromegaly present.  Cardiovascular: Normal rate, regular rhythm and normal heart sounds.   Pulmonary/Chest: No stridor. No respiratory distress. She has no wheezes.  Abdominal: Soft. Bowel sounds are normal. She exhibits no distension and no mass. There is no tenderness. There is no rebound and no guarding.  Musculoskeletal: She exhibits no edema or tenderness.  Lymphadenopathy:    She has no cervical adenopathy.  Neurological: She displays normal reflexes. No cranial nerve deficit. She exhibits normal muscle tone. Coordination normal.  Skin: No rash noted. No erythema.  Psychiatric: She has a normal mood and affect. Her behavior is normal. Judgment and thought content normal.  Lab Results  Component Value Date   WBC 5.7 09/18/2015   HGB 13.1 09/18/2015   HCT 39.3 09/18/2015   PLT 328.0 09/18/2015   GLUCOSE 97 09/18/2015   CHOL 238* 09/18/2015   TRIG 85.0 09/18/2015   HDL 66.10 09/18/2015   LDLDIRECT 125.6 01/16/2013   LDLCALC 155* 09/18/2015   ALT 16 09/18/2015   AST 26 09/18/2015   NA 137 09/18/2015   K 4.1 09/18/2015   CL 99 09/18/2015    CREATININE 1.48* 09/18/2015   BUN 32* 09/18/2015   CO2 28 09/18/2015   TSH 3.52 09/18/2015   HGBA1C 6.2 09/18/2015    Mr Card Morphology Wo/w Cm  02/13/2013  Cardiac MRI: Indication RA Mass The patient was scanned on a 1.5 Tesla GE magnet A dedicated cardiac coil was used. Functional imaging was done using Fiesta sequences.  The patient received 20 cc of multihance. Unfortunately she became nauseated and delayed IIR images were suboptimal.  The tech did not do any T2 weighted images Findings: The LA and LV were normal.  Quantitative EF was 69% ( EDV 126, ESV 39 SV 86 ) There was no ASD or VSD.  The aortic tricuspic and mitral vavles appeared normal The was no evidence of SBE.  The LAA was free of thrombus.  The IVC was normal The RV had normal size and function.  The proximal MPA and right and left branches were normal Thee was an ill defined mass in the lower aspect of the lateral RA wall.  It measures 1.9 by 1.5 cm.  The tissue characteristics of the central area were different than the periphery suggesting possible necrosis.  There was no appreciable gadolinium uptake although the study was performed outside the ideal time window for imaging Impression:    1)    RA mass not well characterized by MRI due to lack of T2       weighted images and patient not tolerating gadolinium Fiesta       measurement 1.9 x 1.5 cm.       2)    Normal LV EF 69% 3)    Normal RV 4)    No LAA thrombus 5)    Normal IVC 6)    Normal PA and ascending aorta 7)    Normal TV, AV and MV with no signs of SBE Review of TTE shows lesion much better. Can consider TEE if clinically indicated.  Agree with anticoagulation for intracardiac mass this size given risk of PE Charlton Haws MD [facility] Original Report Authenticated By: Charlton Haws, M.D.    Assessment & Plan:   There are no diagnoses linked to this encounter. I am having Ms. Gelles maintain her CENTRUM SILVER, cholecalciferol, PARoxetine, aspirin EC, triamcinolone cream,  CRESTOR, amLODipine, triamterene-hydrochlorothiazide, losartan, and atenolol.  No orders of the defined types were placed in this encounter.     Follow-up: No Follow-up on file.  Sonda Primes, MD

## 2016-03-04 NOTE — Assessment & Plan Note (Signed)
On Crestor 

## 2016-03-16 ENCOUNTER — Other Ambulatory Visit: Payer: Self-pay | Admitting: *Deleted

## 2016-03-16 MED ORDER — LOSARTAN POTASSIUM 100 MG PO TABS: 100.0000 mg | ORAL_TABLET | Freq: Every day | ORAL | Status: DC

## 2016-03-16 MED ORDER — PAROXETINE HCL 10 MG PO TABS: ORAL_TABLET | ORAL | Status: DC

## 2016-03-19 ENCOUNTER — Other Ambulatory Visit: Payer: Self-pay | Admitting: Internal Medicine

## 2016-03-23 ENCOUNTER — Other Ambulatory Visit: Payer: Self-pay | Admitting: Internal Medicine

## 2016-04-06 DIAGNOSIS — H524 Presbyopia: Secondary | ICD-10-CM | POA: Diagnosis not present

## 2016-04-06 DIAGNOSIS — H2513 Age-related nuclear cataract, bilateral: Secondary | ICD-10-CM | POA: Diagnosis not present

## 2016-04-21 ENCOUNTER — Telehealth: Payer: Self-pay | Admitting: Internal Medicine

## 2016-04-21 ENCOUNTER — Other Ambulatory Visit: Payer: Self-pay | Admitting: Internal Medicine

## 2016-04-21 NOTE — Telephone Encounter (Signed)
Patient called to advise that this morning she started to experience urgency in urination. She wants to ask for direction. Declined visit.   She was insistent that you need to look at her lab results from her most recent visit to use as a benchmark. Please give her a call.

## 2016-04-22 ENCOUNTER — Ambulatory Visit (INDEPENDENT_AMBULATORY_CARE_PROVIDER_SITE_OTHER): Payer: Medicare Other | Admitting: Internal Medicine

## 2016-04-22 ENCOUNTER — Other Ambulatory Visit: Payer: Medicare Other

## 2016-04-22 VITALS — BP 138/74 | HR 60 | Temp 98.2°F | Resp 16 | Wt 121.0 lb

## 2016-04-22 DIAGNOSIS — R300 Dysuria: Secondary | ICD-10-CM | POA: Diagnosis not present

## 2016-04-22 DIAGNOSIS — I129 Hypertensive chronic kidney disease with stage 1 through stage 4 chronic kidney disease, or unspecified chronic kidney disease: Secondary | ICD-10-CM

## 2016-04-22 LAB — POCT URINALYSIS DIPSTICK
Bilirubin, UA: NEGATIVE
Blood, UA: 10
Glucose, UA: NEGATIVE
Ketones, UA: NEGATIVE
Nitrite, UA: NEGATIVE
Protein, UA: NEGATIVE
Spec Grav, UA: 1.025
Urobilinogen, UA: 0.2
pH, UA: 6

## 2016-04-22 NOTE — Progress Notes (Signed)
Pre visit review using our clinic review tool, if applicable. No additional management support is needed unless otherwise documented below in the visit note. 

## 2016-04-22 NOTE — Telephone Encounter (Signed)
Check UA. OV w/any provider pls Thx

## 2016-04-22 NOTE — Progress Notes (Signed)
Subjective:    Patient ID: Vickie Wilson, female    DOB: 12/20/1930, 82 y.o.   MRN: [id number]  HPI  Here with c/o urinary freq and urgency, with mild dysuria x 3 days, assoc with lower abd pressure lower back pain worse to sit up or walk, better to lie down.  Denies urinary symptoms such as flank pain, hematuria or n/v, fever, chills.  Not better with pessary removal per pt 2 days ago. Pt denies chest pain, increased sob or doe, wheezing, orthopnea, PND, increased LE swelling, palpitations, dizziness or syncope. Pt denies new neurological symptoms such as new headache, or facial or extremity weakness or numbness Past Medical History:  Diagnosis Date  . Allergy   . Anxiety   . Atrial mass    right  . Constipation   . Depression   . Diabetes mellitus   . Hyperlipidemia   . Hypertension   . Osteoporosis    Past Surgical History:  Procedure Laterality Date  . DILATION AND CURETTAGE OF UTERUS  1987    reports that she quit smoking about 18 years ago. She has a 20.00 pack-year smoking history. She has never used smokeless tobacco. She reports that she does not drink alcohol or use drugs. family history includes Alcohol abuse in her father; Heart disease in her father and mother; Hyperlipidemia in her other; Hypertension in her other; Stroke in her other. Allergies  Allergen Reactions  . Captopril     REACTION: cough   Current Outpatient Prescriptions on File Prior to Visit  Medication Sig Dispense Refill  . aspirin EC 81 MG tablet Take 81 mg by mouth daily.    Marland Kitchen atenolol (TENORMIN) 100 MG tablet TAKE (1/2) TABLET TWICE DAILY. 30 tablet 11  . CRESTOR 20 MG tablet TAKE 1 TABLET ONCE DAILY. 30 tablet 5  . losartan (COZAAR) 100 MG tablet Take 1 tablet (100 mg total) by mouth daily. 90 tablet 1  . Multiple Vitamins-Minerals (CENTRUM SILVER) CHEW Chew 1 each by mouth daily.      Marland Kitchen PARoxetine (PAXIL) 10 MG tablet TAKE 1 TABLET IN THE MORNING. 90 tablet 1  . triamcinolone cream (KENALOG)  0.1 % Apply 1 application topically 2 (two) times daily. 45 g 3  . triamterene-hydrochlorothiazide (MAXZIDE) 75-50 MG tablet TAKE (1/2) TABLET DAILY IN THE MORNING. 45 tablet 0  . cholecalciferol (VITAMIN D) 1000 UNITS tablet Take 1,000 Units by mouth daily.     No current facility-administered medications on file prior to visit.    Review of Systems  All otherwise neg per pt     Objective:   Physical Exam BP 138/74   Pulse 60   Temp 98.2 F (36.8 C) (Oral)   Resp 16   Wt 121 lb (54.9 kg)   SpO2 96%   BMI 23.63 kg/m  VS noted,  Constitutional: Pt appears in no apparent distress HENT: Head: NCAT.  Right Ear: External ear normal.  Left Ear: External ear normal.  Eyes: . Pupils are equal, round, and reactive to light. Conjunctivae and EOM are normal Neck: Normal range of motion. Neck supple.  Cardiovascular: Normal rate and regular rhythm.   Pulmonary/Chest: Effort normal and breath sounds without rales or wheezing.  Abd:  Soft, ND, + BS, with mild tender low mid abd without guarding or rebound Neurological: Pt is alert. Not confused , motor grossly intact Skin: Skin is warm. No rash, no LE edema Psychiatric: Pt behavior is normal. No agitation.   POCT urinalysis dipstick  Order: [id number]  Status:  Final result Visible to patient:  No (Not Released) Dx:  Dysuria    Ref Range & Units 1d ago 73mo ago 5858yr ago   Color, UA  yellow     Clarity, UA  cloudy     Glucose, UA  neg     Bilirubin, UA  neg     Ketones, UA  neg     Spec Grav, UA  1.025     Blood, UA  10 ery/ul     pH, UA  6.0     Protein, UA  neg     Urobilinogen, UA  0.2 0.2 0.2   Nitrite, UA  neg     Leukocytes, UA Negative small (1+)  LARGE  LARGE   Resulting Agency               Assessment & Plan:

## 2016-04-22 NOTE — Patient Instructions (Signed)
Please take all new medication as prescribed - the antibiotic  Your specimen is being sent for culture as well, and you should be notified if the antibiotic needs to be adjusted  Please continue all other medications as before, and refills have been done if requested.  Please have the pharmacy call with any other refills you may need.  Please keep your appointments with your specialists as you may have planned

## 2016-04-22 NOTE — Telephone Encounter (Signed)
Patient called in.  Got patient scheduled at 4pm with Jonny RuizJohn.

## 2016-04-23 ENCOUNTER — Other Ambulatory Visit: Payer: Self-pay | Admitting: Internal Medicine

## 2016-04-24 ENCOUNTER — Telehealth: Payer: Self-pay | Admitting: Internal Medicine

## 2016-04-24 ENCOUNTER — Other Ambulatory Visit: Payer: Self-pay | Admitting: Internal Medicine

## 2016-04-24 LAB — URINE CULTURE: Colony Count: >=100000

## 2016-04-24 MED ORDER — CEPHALEXIN 500 MG PO CAPS: 500.0000 mg | ORAL_CAPSULE | Freq: Four times a day (QID) | ORAL | 0 refills | Status: DC

## 2016-04-24 NOTE — Telephone Encounter (Signed)
Pt wanting to know if urine culture back yet, she is wanted meds sent in today.

## 2016-04-24 NOTE — Telephone Encounter (Signed)
Patient aware.

## 2016-04-24 NOTE — Telephone Encounter (Signed)
Pt with + urine cx  Ok for antibx - done erx - cephalexin   Corinne to inform pt, I will do rx

## 2016-04-27 MED ORDER — CIPROFLOXACIN HCL 250 MG PO TABS: 250.0000 mg | ORAL_TABLET | Freq: Two times a day (BID) | ORAL | 0 refills | Status: DC

## 2016-04-27 NOTE — Telephone Encounter (Signed)
Notified pt w/Md response below.Vickie Wilson.[name]/lmb

## 2016-04-27 NOTE — Telephone Encounter (Signed)
Yes: E coli Emailed Cipro Thx

## 2016-04-27 NOTE — Telephone Encounter (Signed)
Left detailed mess informing pt.  

## 2016-04-28 NOTE — Assessment & Plan Note (Signed)
stable overall by history and exam, recent data reviewed with pt, and pt to continue medical treatment as before,  to f/u any worsening symptoms or concerns BP Readings from Last 3 Encounters:  04/22/16 138/74  03/04/16 124/60  09/04/15 120/86

## 2016-04-28 NOTE — Assessment & Plan Note (Signed)
Mild to mod, c/w prob uti,  for antibx course,  For urine cx, to f/u any worsening symptoms or concerns

## 2016-06-01 ENCOUNTER — Telehealth: Payer: Self-pay | Admitting: Internal Medicine

## 2016-06-01 ENCOUNTER — Ambulatory Visit (INDEPENDENT_AMBULATORY_CARE_PROVIDER_SITE_OTHER): Payer: Medicare Other | Admitting: Family Medicine

## 2016-06-01 ENCOUNTER — Encounter: Payer: Self-pay | Admitting: Family Medicine

## 2016-06-01 DIAGNOSIS — L03011 Cellulitis of right finger: Secondary | ICD-10-CM | POA: Diagnosis not present

## 2016-06-01 MED ORDER — CEPHALEXIN 500 MG PO CAPS: 500.0000 mg | ORAL_CAPSULE | Freq: Three times a day (TID) | ORAL | 0 refills | Status: DC

## 2016-06-01 NOTE — Telephone Encounter (Signed)
[facility].  She has follow up scheduled with you on wed.

## 2016-06-01 NOTE — Patient Instructions (Addendum)
Please take antibiotic as directed and follow up in 2 days for reevaluation of finger prior to your surgery. If symptoms do not improve, worsen, or you develop a fever >100 follow up sooner.   You may use warm soaks along with antibiotic for comfort also.

## 2016-06-01 NOTE — Telephone Encounter (Signed)
Ok 4:30 Thx 

## 2016-06-01 NOTE — Progress Notes (Signed)
Pre visit review using our clinic review tool, if applicable. No additional management support is needed unless otherwise documented below in the visit note.   Patient declines taking any vitals including Wt, Ht, Bp,P, Temp, O2.. She states she just wanted a provider to look at her finger and [facility]. States her BP is most often high. ~Phillips Goulette, CMA

## 2016-06-01 NOTE — Telephone Encounter (Signed)
Patient has infection in her finger from sticking it on a cactus.  She is having cataract surgery on Thursday and wants to be seen today.  Our office is full today.  I offered to schedule [facility].  Patient insist on Dr. Posey ReaPlotnikov seeing her today.  Does not want to see anyone else.  Please advise.

## 2016-06-01 NOTE — Progress Notes (Signed)
Subjective:    Patient ID: Vickie Wilson, female    DOB: Jan 26, 1931, 82 y.o.   MRN: [id number]  HPI  Vickie Wilson is an 82 year old female who presents today for evaluation of her "swollen" right index finger.  She reports "sticking my finger with a cactus" 2 days ago and she noticed swelling today. Associated symptoms of discomfort and erythema. She denies fever, chills, sweats, or drainage noted. No open cuts, no treatment at home. She is scheduled for upcoming cataract surgery in 3 days and would like her finger evaluated.  Today, she declined having her vitals taken as she "only wants her finger examined" prior to her eye surgery in 3 days.   Review of Systems  Constitutional: Negative for chills.  Respiratory: Negative for cough and shortness of breath.   Cardiovascular: Negative for chest pain and palpitations.  Gastrointestinal: Negative for diarrhea, nausea and vomiting.  Skin:       Erythema on right index finger  Neurological: Negative for dizziness, light-headedness and headaches.   Past Medical History:  Diagnosis Date  . Allergy   . Anxiety   . Atrial mass    right  . Constipation   . Depression   . Diabetes mellitus   . Hyperlipidemia   . Hypertension   . Osteoporosis      Social History   Social History  . Marital status: Single    Spouse name: N/A  . Number of children: 1  . Years of education: N/A   Occupational History  . retired    Social History Main Topics  . Smoking status: Former Smoker    Packs/day: 1.00    Years: 20.00    Quit date: 03/13/1998  . Smokeless tobacco: Never Used  . Alcohol use No  . Drug use: No  . Sexual activity: Not Currently   Other Topics Concern  . Not on file   Social History Narrative   Regular exercise-yes      Daily caffeine use    Past Surgical History:  Procedure Laterality Date  . DILATION AND CURETTAGE OF UTERUS  1987    Family History  Problem Relation Age of Onset  . Heart disease Mother   .  Alcohol abuse Father   . Heart disease Father   . Hyperlipidemia Other   . Hypertension Other   . Stroke Other     Allergies  Allergen Reactions  . Captopril     REACTION: cough    Current Outpatient Prescriptions on File Prior to Visit  Medication Sig Dispense Refill  . amLODipine (NORVASC) 5 MG tablet TAKE 1 TABLET ONCE DAILY. 90 tablet 3  . aspirin EC 81 MG tablet Take 81 mg by mouth daily.    Marland Kitchen atenolol (TENORMIN) 100 MG tablet TAKE (1/2) TABLET TWICE DAILY. 30 tablet 11  . CRESTOR 20 MG tablet TAKE 1 TABLET ONCE DAILY. (Patient taking differently: TAKE 1/2 tablet every other day) 30 tablet 5  . losartan (COZAAR) 100 MG tablet Take 1 tablet (100 mg total) by mouth daily. 90 tablet 1  . Multiple Vitamins-Minerals (CENTRUM SILVER) CHEW Chew 1 each by mouth daily.      Marland Kitchen PARoxetine (PAXIL) 10 MG tablet TAKE 1 TABLET IN THE MORNING. 90 tablet 1  . triamterene-hydrochlorothiazide (MAXZIDE) 75-50 MG tablet TAKE (1/2) TABLET DAILY IN THE MORNING. 45 tablet 0  . cholecalciferol (VITAMIN D) 1000 UNITS tablet Take 1,000 Units by mouth daily.    Marland Kitchen triamcinolone cream (KENALOG) 0.1 %  Apply 1 application topically 2 (two) times daily. (Patient not taking: Reported on 06/01/2016) 45 g 3   No current facility-administered medications on file prior to visit.     There were no vitals taken for this visit.       Objective:   Physical Exam  Constitutional: She is oriented to person, place, and time. She appears well-developed and well-nourished.  Eyes: Pupils are equal, round, and reactive to light. No scleral icterus.  Cardiovascular: Normal rate and regular rhythm.   Pulmonary/Chest: Effort normal and breath sounds normal. She has no wheezes. She has no rales.  Neurological: She is alert and oriented to person, place, and time.  Skin: Skin is warm and dry.  Right index finger exhibits erythema and mild edema. No drainage, open lesions, or fluctuance present. Full ROM present    Psychiatric: She has a normal mood and affect. Her behavior is normal. Judgment and thought content normal.  She states that she is stressed due to the inability to see her PCP today as he has a full schedule      Assessment & Plan:  1. Cellulitis of finger of right hand Mild edema with erythema present after trigger of touching a cactus 2 days ago. Patient refused vital signs today so unable to evaluate. She exhibits a nontoxic appearance.  HRR with auscultation. Advised 5 day dose of cephalexin and provided precautions to monitor for including increasing erythema, edema, or red streaks moving along finger, or fever. Recommended warm soaks. Advised her to follow up with her PCP in 2 days for evaluation of treatment prior to her cataract surgery. Patient voiced understanding and agreed with plan.  - cephALEXin (KEFLEX) 500 MG capsule; Take 1 capsule (500 mg total) by mouth 3 (three) times daily.  Dispense: 15 capsule; Refill: 0  Delano Metz, FNP-C

## 2016-06-03 ENCOUNTER — Encounter: Payer: Self-pay | Admitting: Internal Medicine

## 2016-06-03 ENCOUNTER — Ambulatory Visit (INDEPENDENT_AMBULATORY_CARE_PROVIDER_SITE_OTHER): Payer: Medicare Other | Admitting: Internal Medicine

## 2016-06-03 DIAGNOSIS — L03011 Cellulitis of right finger: Secondary | ICD-10-CM

## 2016-06-03 MED ORDER — CEFTRIAXONE SODIUM 1 G IJ SOLR
500.0000 mg | INTRAMUSCULAR | Status: DC
  Administered 2016-06-03: 500 mg via INTRAMUSCULAR

## 2016-06-03 MED ORDER — CEPHALEXIN 500 MG PO CAPS: 500.0000 mg | ORAL_CAPSULE | Freq: Four times a day (QID) | ORAL | 0 refills | Status: DC

## 2016-06-03 NOTE — Progress Notes (Signed)
Subjective:  Patient ID: Vickie Wilson, female    DOB: 1930/11/17  Age: 82 y.o. MRN: [id number]  CC: No chief complaint on file.   HPI Vickie Wilson presents for a cactus needle stuck in the R index last week - it is swollen and painful since Mon. She saw Raynelle Fanning and got keflex Rx. Swelling is worse...  Outpatient Medications Prior to Visit  Medication Sig Dispense Refill  . amLODipine (NORVASC) 5 MG tablet TAKE 1 TABLET ONCE DAILY. 90 tablet 3  . aspirin EC 81 MG tablet Take 81 mg by mouth daily.    Marland Kitchen atenolol (TENORMIN) 100 MG tablet TAKE (1/2) TABLET TWICE DAILY. 30 tablet 11  . cephALEXin (KEFLEX) 500 MG capsule Take 1 capsule (500 mg total) by mouth 3 (three) times daily. 15 capsule 0  . CRESTOR 20 MG tablet TAKE 1 TABLET ONCE DAILY. (Patient taking differently: TAKE 1/2 tablet every other day) 30 tablet 5  . losartan (COZAAR) 100 MG tablet Take 1 tablet (100 mg total) by mouth daily. 90 tablet 1  . Multiple Vitamins-Minerals (CENTRUM SILVER) CHEW Chew 1 each by mouth daily.      Marland Kitchen PARoxetine (PAXIL) 10 MG tablet TAKE 1 TABLET IN THE MORNING. 90 tablet 1  . triamterene-hydrochlorothiazide (MAXZIDE) 75-50 MG tablet TAKE (1/2) TABLET DAILY IN THE MORNING. 45 tablet 0  . cholecalciferol (VITAMIN D) 1000 UNITS tablet Take 1,000 Units by mouth daily.     No facility-administered medications prior to visit.     ROS Review of Systems  Constitutional: Negative for chills and fever.  Skin: Positive for color change.    Objective:  BP 130/78   Pulse 60   Temp 98.4 F (36.9 C) (Oral)   Wt 122 lb (55.3 kg)   SpO2 97%   BMI 23.83 kg/m   BP Readings from Last 3 Encounters:  06/03/16 130/78  04/22/16 138/74  03/04/16 124/60    Wt Readings from Last 3 Encounters:  06/03/16 122 lb (55.3 kg)  04/22/16 121 lb (54.9 kg)  03/04/16 121 lb (54.9 kg)    Physical Exam  Constitutional: She appears well-developed and well-nourished. No distress.  Cardiovascular: Normal rate.     Musculoskeletal: She exhibits no edema.  Skin: She is not diaphoretic. There is erythema. No pallor.  Psychiatric: She has a normal mood and affect.   R index finger is swollen in distal 2/3d and tender  Procedure note:  Incision and Drainage of an Abscess   Indication : a localized collection of pus that is tender and not spontaneously resolving.    Risks including unsuccessful procedure , possible need for a repeat procedure due to pus accumulation, scar formation, and others as well as benefits were explained to the patient in detail.Verbal consent was obtained/signed.     The area of the finger abscess was prepped with povidone-iodine. Paronychia was incised. About 1/4 cc of purulent material was expressed.The cavity was cleaned.   The wound was dressed with antibiotic ointment and a bandaid.  Tolerated well. Complications: None. Splinter removed  Wound instructions provided.    Please contact us if you notice a recollection of pus in the abscess fever and chills increased pain redness red streaks near the abscess increased swelling in the area.      Lab Results  Component Value Date   WBC 5.7 09/18/2015   HGB 13.1 09/18/2015   HCT 39.3 09/18/2015   PLT 328.0 09/18/2015   GLUCOSE 95 03/04/2016   CHOL  238 (H) 09/18/2015   TRIG 85.0 09/18/2015   HDL 66.10 09/18/2015   LDLDIRECT 125.6 01/16/2013   LDLCALC 155 (H) 09/18/2015   ALT 16 09/18/2015   AST 26 09/18/2015   NA 134 (L) 03/04/2016   K 3.5 03/04/2016   CL 98 03/04/2016   CREATININE 1.18 03/04/2016   BUN 25 (H) 03/04/2016   CO2 27 03/04/2016   TSH 3.52 09/18/2015   HGBA1C 6.2 09/18/2015    Mr Card Morphology Wo/w Cm  Result Date: 02/13/2013 Cardiac MRI: Indication RA Mass The patient was scanned on a 1.5 Tesla GE magnet A dedicated cardiac coil was used. Functional imaging was done using Fiesta sequences.  The patient received 20 cc of multihance. Unfortunately she became nauseated and delayed IIR images were  suboptimal.  The tech did not do any T2 weighted images Findings: The LA and LV were normal.  Quantitative EF was 69% ( EDV 126, ESV 39 SV 86 ) There was no ASD or VSD.  The aortic tricuspic and mitral vavles appeared normal The was no evidence of SBE.  The LAA was free of thrombus.  The IVC was normal The RV had normal size and function.  The proximal MPA and right and left branches were normal Thee was an ill defined mass in the lower aspect of the lateral RA wall.  It measures 1.9 by 1.5 cm.  The tissue characteristics of the central area were different than the periphery suggesting possible necrosis.  There was no appreciable gadolinium uptake although the study was performed outside the ideal time window for imaging Impression:    1)    RA mass not well characterized by MRI due to lack of T2       weighted images and patient not tolerating gadolinium Fiesta       measurement 1.9 x 1.5 cm.       2)    Normal LV EF 69% 3)    Normal RV 4)    No LAA thrombus 5)    Normal IVC 6)    Normal PA and ascending aorta 7)    Normal TV, AV and MV with no signs of SBE Review of TTE shows lesion much better. Can consider TEE if clinically indicated.  Agree with anticoagulation for intracardiac mass this size given risk of PE Charlton HawsPeter Nishan MD [facility] Original Report Authenticated By: Charlton HawsPeter Nishan, M.D.    Assessment & Plan:   There are no diagnoses linked to this encounter. I am having Vickie Wilson maintain her CENTRUM SILVER, cholecalciferol, aspirin EC, CRESTOR, PARoxetine, losartan, atenolol, triamterene-hydrochlorothiazide, amLODipine, and cephALEXin.  No orders of the defined types were placed in this encounter.    Follow-up: No Follow-up on file.  Sonda PrimesAlex Jatorian Renault, MD

## 2016-06-03 NOTE — Patient Instructions (Addendum)
Soak in salt and baking soda   Wound instructions provided.    Please contact us if you notice a recollection of pus in the abscess fever and chills increased pain redness red streaks near the abscess increased swelling in the area.

## 2016-06-03 NOTE — Progress Notes (Signed)
Pre visit review using our clinic review tool, if applicable. No additional management support is needed unless otherwise documented below in the visit note. 

## 2016-07-15 ENCOUNTER — Other Ambulatory Visit: Payer: Self-pay | Admitting: Internal Medicine

## 2016-07-30 DIAGNOSIS — H25812 Combined forms of age-related cataract, left eye: Secondary | ICD-10-CM | POA: Diagnosis not present

## 2016-07-30 DIAGNOSIS — H2512 Age-related nuclear cataract, left eye: Secondary | ICD-10-CM | POA: Diagnosis not present

## 2016-08-17 ENCOUNTER — Telehealth: Payer: Self-pay | Admitting: Internal Medicine

## 2016-08-17 NOTE — Telephone Encounter (Signed)
Patient Name: Vickie Wilson DOB: 02/06/1931 Initial Comment Caller states has questions about getting the flu shot. Nurse Assessment Nurse: Vickie AnnaHensel, Vickie Wilson, Vickie Wilson Date/Time (Eastern Time): 08/17/2016 11:18:01 AM Confirm and document reason for call. If symptomatic, describe symptoms. ---Caller states, she cataract surgery almost 2 weeks ago. She was wondering if she could get the flu shot. She has not had the flu shot. Does the patient have any new or worsening symptoms? ---No Please document clinical information provided and list any resource used. ---Nurse advised via nurse knowledge pt is not having any symptoms at this time she can get the flu shot. Nurse also advised via webmd the high dose Flu vaccine is usually recommended for pt 65 and over. However they could make that decision when she was seen for the flu shot. Guidelines Guideline Title Affirmed Question Affirmed Notes Final Disposition User

## 2016-09-02 ENCOUNTER — Ambulatory Visit (INDEPENDENT_AMBULATORY_CARE_PROVIDER_SITE_OTHER): Payer: Medicare Other | Admitting: Internal Medicine

## 2016-09-02 ENCOUNTER — Encounter: Payer: Self-pay | Admitting: Internal Medicine

## 2016-09-02 VITALS — BP 120/70 | HR 56 | Wt 119.0 lb

## 2016-09-02 DIAGNOSIS — Z0000 Encounter for general adult medical examination without abnormal findings: Secondary | ICD-10-CM | POA: Diagnosis not present

## 2016-09-02 DIAGNOSIS — E119 Type 2 diabetes mellitus without complications: Secondary | ICD-10-CM

## 2016-09-02 DIAGNOSIS — I129 Hypertensive chronic kidney disease with stage 1 through stage 4 chronic kidney disease, or unspecified chronic kidney disease: Secondary | ICD-10-CM

## 2016-09-02 DIAGNOSIS — E785 Hyperlipidemia, unspecified: Secondary | ICD-10-CM | POA: Diagnosis not present

## 2016-09-02 DIAGNOSIS — Z23 Encounter for immunization: Secondary | ICD-10-CM | POA: Diagnosis not present

## 2016-09-02 NOTE — Progress Notes (Signed)
Pre visit review using our clinic review tool, if applicable. No additional management support is needed unless otherwise documented below in the visit note. 

## 2016-09-02 NOTE — Addendum Note (Signed)
Addended by: Merrilyn PumaSIMMONS, Axcel Horsch N on: 09/02/2016 04:51 PM   Modules accepted: Orders

## 2016-09-02 NOTE — Assessment & Plan Note (Signed)
Crestor Labs 

## 2016-09-02 NOTE — Assessment & Plan Note (Signed)
Labs

## 2016-09-02 NOTE — Assessment & Plan Note (Signed)

## 2016-09-02 NOTE — Patient Instructions (Signed)

## 2016-09-02 NOTE — Assessment & Plan Note (Signed)
Atenolol Amlodipine, Maxzide

## 2016-09-02 NOTE — Progress Notes (Signed)
Subjective:  Patient ID: Vickie Wilson, female    DOB: 02/01/1931  Age: 82 y.o. MRN: [id number]  CC: No chief complaint on file.   HPI Vickie Wilson presents for a well exam  Outpatient Medications Prior to Visit  Medication Sig Dispense Refill  . amLODipine (NORVASC) 5 MG tablet TAKE 1 TABLET ONCE DAILY. 90 tablet 3  . aspirin EC 81 MG tablet Take 81 mg by mouth daily.    Marland Kitchen. atenolol (TENORMIN) 100 MG tablet TAKE (1/2) TABLET TWICE DAILY. 30 tablet 11  . CRESTOR 20 MG tablet TAKE 1 TABLET ONCE DAILY. (Patient taking differently: TAKE 1/2 tablet every other day) 30 tablet 5  . losartan (COZAAR) 100 MG tablet Take 1 tablet (100 mg total) by mouth daily. 90 tablet 1  . Multiple Vitamins-Minerals (CENTRUM SILVER) CHEW Chew 1 each by mouth daily.      Marland Kitchen. PARoxetine (PAXIL) 10 MG tablet TAKE 1 TABLET IN THE MORNING. 90 tablet 1  . triamterene-hydrochlorothiazide (MAXZIDE) 75-50 MG tablet TAKE (1/2) TABLET DAILY IN THE MORNING. 45 tablet 0  . cephALEXin (KEFLEX) 500 MG capsule Take 1 capsule (500 mg total) by mouth 4 (four) times daily. 28 capsule 0  . cholecalciferol (VITAMIN D) 1000 UNITS tablet Take 1,000 Units by mouth daily.     Facility-Administered Medications Prior to Visit  Medication Dose Route Frequency Provider Last Rate Last Dose  . cefTRIAXone (ROCEPHIN) injection 500 mg  500 mg Intramuscular Q24H Tresa GarterAleksei V Plotnikov, MD   500 mg at 06/03/16 1224    ROS Review of Systems  Constitutional: Negative for activity change, appetite change, chills, fatigue and unexpected weight change.  HENT: Negative for congestion, mouth sores and sinus pressure.   Eyes: Negative for visual disturbance.  Respiratory: Negative for cough and chest tightness.   Gastrointestinal: Negative for abdominal pain and nausea.  Genitourinary: Negative for difficulty urinating, frequency and vaginal pain.  Musculoskeletal: Positive for gait problem. Negative for back pain.  Skin: Negative for pallor and  rash.  Neurological: Negative for dizziness, tremors, weakness, numbness and headaches.  Psychiatric/Behavioral: Negative for confusion, sleep disturbance and suicidal ideas.    Objective:  BP 120/70   Pulse (!) 56   Wt 119 lb (54 kg)   SpO2 97%   BMI 23.24 kg/m   BP Readings from Last 3 Encounters:  09/02/16 120/70  06/03/16 130/78  04/22/16 138/74    Wt Readings from Last 3 Encounters:  09/02/16 119 lb (54 kg)  06/03/16 122 lb (55.3 kg)  04/22/16 121 lb (54.9 kg)    Physical Exam  Constitutional: She appears well-developed. No distress.  HENT:  Head: Normocephalic.  Right Ear: External ear normal.  Left Ear: External ear normal.  Nose: Nose normal.  Mouth/Throat: Oropharynx is clear and moist.  Eyes: Conjunctivae are normal. Pupils are equal, round, and reactive to light. Right eye exhibits no discharge. Left eye exhibits no discharge.  Neck: Normal range of motion. Neck supple. No JVD present. No tracheal deviation present. No thyromegaly present.  Cardiovascular: Normal rate, regular rhythm and normal heart sounds.   Pulmonary/Chest: No stridor. No respiratory distress. She has no wheezes.  Abdominal: Soft. Bowel sounds are normal. She exhibits no distension and no mass. There is no tenderness. There is no rebound and no guarding.  Musculoskeletal: She exhibits no edema or tenderness.  Lymphadenopathy:    She has no cervical adenopathy.  Neurological: She displays normal reflexes. No cranial nerve deficit. She exhibits normal muscle tone.  Coordination normal.  Skin: No rash noted. No erythema.  Psychiatric: She has a normal mood and affect. Her behavior is normal. Judgment and thought content normal.    Lab Results  Component Value Date   WBC 5.7 09/18/2015   HGB 13.1 09/18/2015   HCT 39.3 09/18/2015   PLT 328.0 09/18/2015   GLUCOSE 95 03/04/2016   CHOL 238 (H) 09/18/2015   TRIG 85.0 09/18/2015   HDL 66.10 09/18/2015   LDLDIRECT 125.6 01/16/2013   LDLCALC  155 (H) 09/18/2015   ALT 16 09/18/2015   AST 26 09/18/2015   NA 134 (L) 03/04/2016   K 3.5 03/04/2016   CL 98 03/04/2016   CREATININE 1.18 03/04/2016   BUN 25 (H) 03/04/2016   CO2 27 03/04/2016   TSH 3.52 09/18/2015   HGBA1C 6.2 09/18/2015    Mr Card Morphology Wo/w Cm  Result Date: 02/13/2013 Cardiac MRI: Indication RA Mass The patient was scanned on a 1.5 Tesla GE magnet A dedicated cardiac coil was used. Functional imaging was done using Fiesta sequences.  The patient received 20 cc of multihance. Unfortunately she became nauseated and delayed IIR images were suboptimal.  The tech did not do any T2 weighted images Findings: The LA and LV were normal.  Quantitative EF was 69% ( EDV 126, ESV 39 SV 86 ) There was no ASD or VSD.  The aortic tricuspic and mitral vavles appeared normal The was no evidence of SBE.  The LAA was free of thrombus.  The IVC was normal The RV had normal size and function.  The proximal MPA and right and left branches were normal Thee was an ill defined mass in the lower aspect of the lateral RA wall.  It measures 1.9 by 1.5 cm.  The tissue characteristics of the central area were different than the periphery suggesting possible necrosis.  There was no appreciable gadolinium uptake although the study was performed outside the ideal time window for imaging Impression:    1)    RA mass not well characterized by MRI due to lack of T2       weighted images and patient not tolerating gadolinium Fiesta       measurement 1.9 x 1.5 cm.       2)    Normal LV EF 69% 3)    Normal RV 4)    No LAA thrombus 5)    Normal IVC 6)    Normal PA and ascending aorta 7)    Normal TV, AV and MV with no signs of SBE Review of TTE shows lesion much better. Can consider TEE if clinically indicated.  Agree with anticoagulation for intracardiac mass this size given risk of PE Charlton HawsPeter Nishan MD [facility] Original Report Authenticated By: Charlton HawsPeter Nishan, M.D.    Assessment & Plan:   There are no diagnoses linked  to this encounter. I have discontinued Vickie Wilson cephALEXin. I am also having her maintain her CENTRUM SILVER, cholecalciferol, aspirin EC, CRESTOR, PARoxetine, losartan, atenolol, amLODipine, and triamterene-hydrochlorothiazide. We will continue to administer cefTRIAXone.  No orders of the defined types were placed in this encounter.    Follow-up: No Follow-up on file.  Sonda PrimesAlex Plotnikov, MD

## 2016-09-08 ENCOUNTER — Other Ambulatory Visit: Payer: Self-pay | Admitting: Internal Medicine

## 2016-09-17 ENCOUNTER — Other Ambulatory Visit (INDEPENDENT_AMBULATORY_CARE_PROVIDER_SITE_OTHER): Payer: Medicare Other

## 2016-09-17 DIAGNOSIS — E785 Hyperlipidemia, unspecified: Secondary | ICD-10-CM | POA: Diagnosis not present

## 2016-09-17 DIAGNOSIS — E119 Type 2 diabetes mellitus without complications: Secondary | ICD-10-CM

## 2016-09-17 DIAGNOSIS — Z0000 Encounter for general adult medical examination without abnormal findings: Secondary | ICD-10-CM | POA: Diagnosis not present

## 2016-09-17 DIAGNOSIS — I129 Hypertensive chronic kidney disease with stage 1 through stage 4 chronic kidney disease, or unspecified chronic kidney disease: Secondary | ICD-10-CM | POA: Diagnosis not present

## 2016-09-17 LAB — CBC WITH DIFFERENTIAL/PLATELET
Basophils Absolute: 0 10*3/uL (ref 0.0–0.1)
Basophils Relative: 0.7 % (ref 0.0–3.0)
Eosinophils Absolute: 0.2 10*3/uL (ref 0.0–0.7)
Eosinophils Relative: 2.8 % (ref 0.0–5.0)
HCT: 36.3 % (ref 36.0–46.0)
Hemoglobin: 12.4 g/dL (ref 12.0–15.0)
Lymphocytes Relative: 35.3 % (ref 12.0–46.0)
Lymphs Abs: 2.2 10*3/uL (ref 0.7–4.0)
MCHC: 34.2 g/dL (ref 30.0–36.0)
MCV: 82.6 fl (ref 78.0–100.0)
Monocytes Absolute: 0.7 10*3/uL (ref 0.1–1.0)
Monocytes Relative: 11.4 % (ref 3.0–12.0)
Neutro Abs: 3.1 10*3/uL (ref 1.4–7.7)
Neutrophils Relative %: 49.8 % (ref 43.0–77.0)
Platelets: 329 10*3/uL (ref 150.0–400.0)
RBC: 4.4 Mil/uL (ref 3.87–5.11)
RDW: 15 % (ref 11.5–15.5)
WBC: 6.2 10*3/uL (ref 4.0–10.5)

## 2016-09-17 LAB — URINALYSIS, ROUTINE W REFLEX MICROSCOPIC
Bilirubin Urine: NEGATIVE
Ketones, ur: NEGATIVE
Nitrite: NEGATIVE
Specific Gravity, Urine: 1.01 (ref 1.000–1.030)
Total Protein, Urine: NEGATIVE
Urine Glucose: NEGATIVE
Urobilinogen, UA: 0.2 (ref 0.0–1.0)
pH: 7 (ref 5.0–8.0)

## 2016-09-17 LAB — BASIC METABOLIC PANEL
BUN: 21 mg/dL (ref 6–23)
CO2: 27 mEq/L (ref 19–32)
Calcium: 9.4 mg/dL (ref 8.4–10.5)
Chloride: 99 mEq/L (ref 96–112)
Creatinine, Ser: 1.32 mg/dL — ABNORMAL HIGH (ref 0.40–1.20)
GFR: 40.79 mL/min — ABNORMAL LOW (ref >60.00)
Glucose, Bld: 103 mg/dL — ABNORMAL HIGH (ref 70–99)
Potassium: 3.9 mEq/L (ref 3.5–5.1)
Sodium: 137 mEq/L (ref 135–145)

## 2016-09-17 LAB — TSH: TSH: 2.82 u[IU]/mL (ref 0.35–4.50)

## 2016-09-17 LAB — MICROALBUMIN / CREATININE URINE RATIO
Creatinine,U: 102.2 mg/dL
Microalb Creat Ratio: 2.7 mg/g (ref 0.0–30.0)
Microalb, Ur: 2.8 mg/dL — ABNORMAL HIGH (ref 0.0–1.9)

## 2016-09-17 LAB — HEMOGLOBIN A1C: Hgb A1c MFr Bld: 6.4 % (ref 4.6–6.5)

## 2016-09-17 LAB — LIPID PANEL
Cholesterol: 188 mg/dL (ref 0–200)
HDL: 63.2 mg/dL (ref >39.00)
LDL Cholesterol: 108 mg/dL — ABNORMAL HIGH (ref 0–99)
NonHDL: 124.54
Total CHOL/HDL Ratio: 3
Triglycerides: 82 mg/dL (ref 0.0–149.0)
VLDL: 16.4 mg/dL (ref 0.0–40.0)

## 2016-10-12 ENCOUNTER — Other Ambulatory Visit: Payer: Self-pay | Admitting: Internal Medicine

## 2016-11-30 ENCOUNTER — Other Ambulatory Visit: Payer: Self-pay | Admitting: Internal Medicine

## 2016-12-31 ENCOUNTER — Telehealth: Payer: Self-pay | Admitting: *Deleted

## 2016-12-31 MED ORDER — ROSUVASTATIN CALCIUM 20 MG PO TABS: 20.0000 mg | ORAL_TABLET | Freq: Every day | ORAL | 5 refills | Status: DC

## 2016-12-31 NOTE — Telephone Encounter (Signed)
Rec'd call wanting to verify instructions on pt Crestor. Per chart pt suppose to be taking 1 pill a day. Sent updated script electronically...[name]

## 2017-01-19 ENCOUNTER — Telehealth: Payer: Self-pay | Admitting: Internal Medicine

## 2017-01-19 NOTE — Telephone Encounter (Signed)
Pt has been having constipation for a few weeks, can something be called in or what is suggested to take OTC.   [facility] [address]

## 2017-01-19 NOTE — Telephone Encounter (Signed)
Try FLEET laxative tablets as directed. Add Miralax 1-2 scoops if neede OV if needed Thx

## 2017-01-19 NOTE — Telephone Encounter (Signed)
Patient would like a different suggestion than FLEET b/c she does not know how to use this.

## 2017-01-19 NOTE — Telephone Encounter (Signed)
Please advise 

## 2017-01-19 NOTE — Telephone Encounter (Signed)
Use Senakot tablets then OV if issues Thx

## 2017-01-19 NOTE — Telephone Encounter (Signed)
Pt.notified

## 2017-01-19 NOTE — Telephone Encounter (Signed)
LM notifying pt

## 2017-01-19 NOTE — Telephone Encounter (Signed)
See below, I called patients pharmacy all they have is an enema for FLEET. Please advise

## 2017-02-18 ENCOUNTER — Encounter: Payer: Self-pay | Admitting: Internal Medicine

## 2017-02-18 ENCOUNTER — Ambulatory Visit (INDEPENDENT_AMBULATORY_CARE_PROVIDER_SITE_OTHER): Payer: Medicare Other | Admitting: Internal Medicine

## 2017-02-18 VITALS — BP 140/78 | HR 56 | Temp 98.3°F | Ht 60.0 in | Wt 116.0 lb

## 2017-02-18 DIAGNOSIS — I129 Hypertensive chronic kidney disease with stage 1 through stage 4 chronic kidney disease, or unspecified chronic kidney disease: Secondary | ICD-10-CM | POA: Diagnosis not present

## 2017-02-18 DIAGNOSIS — K5901 Slow transit constipation: Secondary | ICD-10-CM | POA: Diagnosis not present

## 2017-02-18 DIAGNOSIS — E785 Hyperlipidemia, unspecified: Secondary | ICD-10-CM

## 2017-02-18 DIAGNOSIS — R7309 Other abnormal glucose: Secondary | ICD-10-CM | POA: Diagnosis not present

## 2017-02-18 DIAGNOSIS — F3342 Major depressive disorder, recurrent, in full remission: Secondary | ICD-10-CM | POA: Diagnosis not present

## 2017-02-18 MED ORDER — ZOSTER VAC RECOMB ADJUVANTED 50 MCG/0.5ML IM SUSR: 0.5000 mL | Freq: Once | INTRAMUSCULAR | 1 refills | Status: AC

## 2017-02-18 MED ORDER — LINACLOTIDE 290 MCG PO CAPS: 290.0000 ug | ORAL_CAPSULE | Freq: Every day | ORAL | 5 refills | Status: DC | PRN

## 2017-02-18 MED ORDER — POLYETHYLENE GLYCOL 3350 17 GM/SCOOP PO POWD: 17.0000 g | Freq: Two times a day (BID) | ORAL | 6 refills | Status: DC | PRN

## 2017-02-18 NOTE — Assessment & Plan Note (Signed)
Constipation 

## 2017-02-18 NOTE — Assessment & Plan Note (Signed)
Paxil 

## 2017-02-18 NOTE — Assessment & Plan Note (Signed)
Labs Use atenolol 1/2 tab qhs if lightheaded

## 2017-02-18 NOTE — Progress Notes (Signed)
Subjective:  Patient ID: Vickie Wilson, female    DOB: [birth year]  Age: 83 y.o. MRN: [id number]  CC: No chief complaint on file.   HPI Vickie Wilson presents for dyslipidemia, HTN, anxiety. C/o constipation x 2 wks  Outpatient Medications Prior to Visit  Medication Sig Dispense Refill  . amLODipine (NORVASC) 5 MG tablet TAKE 1 TABLET ONCE DAILY. 90 tablet 3  . aspirin EC 81 MG tablet Take 81 mg by mouth daily.    Marland Kitchen atenolol (TENORMIN) 100 MG tablet TAKE (1/2) TABLET TWICE DAILY. 30 tablet 11  . losartan (COZAAR) 100 MG tablet TAKE 1 TABLET EACH DAY. 90 tablet 3  . Multiple Vitamins-Minerals (CENTRUM SILVER) CHEW Chew 1 each by mouth daily.      Marland Kitchen PARoxetine (PAXIL) 10 MG tablet TAKE 1 TABLET IN THE MORNING. 90 tablet 3  . rosuvastatin (CRESTOR) 20 MG tablet Take 1 tablet (20 mg total) by mouth daily. 30 tablet 5  . triamterene-hydrochlorothiazide (MAXZIDE) 75-50 MG tablet TAKE (1/2) TABLET DAILY IN THE MORNING. 45 tablet 3  . cholecalciferol (VITAMIN D) 1000 UNITS tablet Take 1,000 Units by mouth daily.     Facility-Administered Medications Prior to Visit  Medication Dose Route Frequency Provider Last Rate Last Dose  . cefTRIAXone (ROCEPHIN) injection 500 mg  500 mg Intramuscular Q24H Peyten Weare, Georgina Quint, MD   500 mg at 06/03/16 1224    ROS Review of Systems  Constitutional: Negative for activity change, appetite change, chills, fatigue and unexpected weight change.  HENT: Negative for congestion, mouth sores and sinus pressure.   Eyes: Negative for visual disturbance.  Respiratory: Negative for cough and chest tightness.   Gastrointestinal: Positive for constipation. Negative for abdominal pain and nausea.  Genitourinary: Negative for difficulty urinating, frequency and vaginal pain.  Musculoskeletal: Negative for back pain and gait problem.  Skin: Negative for pallor and rash.  Neurological: Positive for light-headedness. Negative for dizziness, tremors, weakness, numbness  and headaches.  Psychiatric/Behavioral: Negative for confusion and sleep disturbance.    Objective:  BP 140/78 (BP Location: Left Arm, Patient Position: Sitting, Cuff Size: Normal)   Pulse (!) 56   Temp 98.3 F (36.8 C) (Oral)   Ht 5' (1.524 m)   Wt 116 lb (52.6 kg)   SpO2 96%   BMI 22.65 kg/m   BP Readings from Last 3 Encounters:  02/18/17 140/78  09/02/16 120/70  06/03/16 130/78    Wt Readings from Last 3 Encounters:  02/18/17 116 lb (52.6 kg)  09/02/16 119 lb (54 kg)  06/03/16 122 lb (55.3 kg)    Physical Exam  Constitutional: She appears well-developed. No distress.  HENT:  Head: Normocephalic.  Right Ear: External ear normal.  Left Ear: External ear normal.  Nose: Nose normal.  Mouth/Throat: Oropharynx is clear and moist.  Eyes: Conjunctivae are normal. Pupils are equal, round, and reactive to light. Right eye exhibits no discharge. Left eye exhibits no discharge.  Neck: Normal range of motion. Neck supple. No JVD present. No tracheal deviation present. No thyromegaly present.  Cardiovascular: Normal rate, regular rhythm and normal heart sounds.   Pulmonary/Chest: No stridor. No respiratory distress. She has no wheezes.  Abdominal: Soft. Bowel sounds are normal. She exhibits no distension and no mass. There is no tenderness. There is no rebound and no guarding.  Musculoskeletal: She exhibits no edema or tenderness.  Lymphadenopathy:    She has no cervical adenopathy.  Neurological: She displays normal reflexes. No cranial nerve deficit. She exhibits normal  muscle tone. Coordination normal.  Skin: No rash noted. No erythema.  Psychiatric: She has a normal mood and affect. Her behavior is normal. Judgment and thought content normal.    Lab Results  Component Value Date   WBC 6.2 09/17/2016   HGB 12.4 09/17/2016   HCT 36.3 09/17/2016   PLT 329.0 09/17/2016   GLUCOSE 103 (H) 09/17/2016   CHOL 188 09/17/2016   TRIG 82.0 09/17/2016   HDL 63.20 09/17/2016    LDLDIRECT 125.6 01/16/2013   LDLCALC 108 (H) 09/17/2016   ALT 16 09/18/2015   AST 26 09/18/2015   NA 137 09/17/2016   K 3.9 09/17/2016   CL 99 09/17/2016   CREATININE 1.32 (H) 09/17/2016   BUN 21 09/17/2016   CO2 27 09/17/2016   TSH 2.82 09/17/2016   HGBA1C 6.4 09/17/2016   MICROALBUR 2.8 (H) 09/17/2016    Mr Card Morphology Wo/w Cm  Result Date: 02/13/2013 Cardiac MRI: Indication RA Mass The patient was scanned on a 1.5 Tesla GE magnet A dedicated cardiac coil was used. Functional imaging was done using Fiesta sequences.  The patient received 20 cc of multihance. Unfortunately she became nauseated and delayed IIR images were suboptimal.  The tech did not do any T2 weighted images Findings: The LA and LV were normal.  Quantitative EF was 69% ( EDV 126, ESV 39 SV 86 ) There was no ASD or VSD.  The aortic tricuspic and mitral vavles appeared normal The was no evidence of SBE.  The LAA was free of thrombus.  The IVC was normal The RV had normal size and function.  The proximal MPA and right and left branches were normal Thee was an ill defined mass in the lower aspect of the lateral RA wall.  It measures 1.9 by 1.5 cm.  The tissue characteristics of the central area were different than the periphery suggesting possible necrosis.  There was no appreciable gadolinium uptake although the study was performed outside the ideal time window for imaging Impression:    1)    RA mass not well characterized by MRI due to lack of T2       weighted images and patient not tolerating gadolinium Fiesta       measurement 1.9 x 1.5 cm.       2)    Normal LV EF 69% 3)    Normal RV 4)    No LAA thrombus 5)    Normal IVC 6)    Normal PA and ascending aorta 7)    Normal TV, AV and MV with no signs of SBE Review of TTE shows lesion much better. Can consider TEE if clinically indicated.  Agree with anticoagulation for intracardiac mass this size given risk of PE Charlton HawsPeter Nishan MD [facility] Original Report Authenticated By: Charlton HawsPeter  Nishan, M.D.    Assessment & Plan:   There are no diagnoses linked to this encounter. I am having Ms. Casaus start on polyethylene glycol powder and linaclotide. I am also having her maintain her CENTRUM SILVER, cholecalciferol, aspirin EC, atenolol, amLODipine, PARoxetine, triamterene-hydrochlorothiazide, losartan, and rosuvastatin. We will continue to administer cefTRIAXone.  Meds ordered this encounter  Medications  . polyethylene glycol powder (GLYCOLAX/MIRALAX) powder    Sig: Take 17-34 g by mouth 2 (two) times daily as needed for moderate constipation.    Dispense:  500 g    Refill:  6  . linaclotide (LINZESS) 290 MCG CAPS capsule    Sig: Take 1 capsule (290 mcg total) by  mouth daily as needed. For constipation    Dispense:  30 capsule    Refill:  5     Follow-up: No Follow-up on file.  Sonda Primes, MD

## 2017-02-18 NOTE — Assessment & Plan Note (Signed)
LABs Crestor

## 2017-02-18 NOTE — Patient Instructions (Signed)
MC well w/Jill 

## 2017-02-23 ENCOUNTER — Other Ambulatory Visit (INDEPENDENT_AMBULATORY_CARE_PROVIDER_SITE_OTHER): Payer: Medicare Other

## 2017-02-23 DIAGNOSIS — R7309 Other abnormal glucose: Secondary | ICD-10-CM

## 2017-02-23 DIAGNOSIS — E785 Hyperlipidemia, unspecified: Secondary | ICD-10-CM | POA: Diagnosis not present

## 2017-02-23 DIAGNOSIS — I129 Hypertensive chronic kidney disease with stage 1 through stage 4 chronic kidney disease, or unspecified chronic kidney disease: Secondary | ICD-10-CM

## 2017-02-23 LAB — HEPATIC FUNCTION PANEL
ALT: 11 U/L (ref 0–35)
AST: 22 U/L (ref 0–37)
Albumin: 4.3 g/dL (ref 3.5–5.2)
Alkaline Phosphatase: 48 U/L (ref 39–117)
Bilirubin, Direct: 0.1 mg/dL (ref 0.0–0.3)
Total Bilirubin: 0.5 mg/dL (ref 0.2–1.2)
Total Protein: 7.1 g/dL (ref 6.0–8.3)

## 2017-02-23 LAB — BASIC METABOLIC PANEL
BUN: 21 mg/dL (ref 6–23)
CO2: 30 mEq/L (ref 19–32)
Calcium: 9.6 mg/dL (ref 8.4–10.5)
Chloride: 97 mEq/L (ref 96–112)
Creatinine, Ser: 1.14 mg/dL (ref 0.40–1.20)
GFR: 48.26 mL/min — ABNORMAL LOW (ref >60.00)
Glucose, Bld: 109 mg/dL — ABNORMAL HIGH (ref 70–99)
Potassium: 3.7 mEq/L (ref 3.5–5.1)
Sodium: 134 mEq/L — ABNORMAL LOW (ref 135–145)

## 2017-02-23 LAB — LIPID PANEL
Cholesterol: 184 mg/dL (ref 0–200)
HDL: 61.6 mg/dL (ref >39.00)
LDL Cholesterol: 106 mg/dL — ABNORMAL HIGH (ref 0–99)
NonHDL: 121.9
Total CHOL/HDL Ratio: 3
Triglycerides: 80 mg/dL (ref 0.0–149.0)
VLDL: 16 mg/dL (ref 0.0–40.0)

## 2017-02-23 LAB — HEMOGLOBIN A1C: Hgb A1c MFr Bld: 6.5 % (ref 4.6–6.5)

## 2017-02-25 ENCOUNTER — Telehealth: Payer: Self-pay | Admitting: Internal Medicine

## 2017-02-25 NOTE — Telephone Encounter (Signed)
Please advise 

## 2017-02-25 NOTE — Telephone Encounter (Signed)
OK fleet enema, Senakot-S OV if issues Thx

## 2017-02-25 NOTE — Telephone Encounter (Signed)
Pt called and stated her constipation is is slightly better, she has a small movement, but her stool does not lok like, it is stringy, she has stopped both of her constipation medication, one hurt her stomach and the other the pharmacists told her not to take.   She would like to know what else she can take or if she needs an enema. Please advise she would like a call back

## 2017-02-26 NOTE — Telephone Encounter (Signed)
Pt.notified

## 2017-03-08 DIAGNOSIS — H2511 Age-related nuclear cataract, right eye: Secondary | ICD-10-CM | POA: Diagnosis not present

## 2017-03-08 NOTE — Telephone Encounter (Signed)
Pt did enema and got relief for a little while but now is back to not going to the bathroom, and when she does it is very small pellet size. She is not having any stomach pain but she is still not going the bathroom   She would like to know if she should come in or take something else

## 2017-03-08 NOTE — Telephone Encounter (Signed)
LMTCB

## 2017-03-09 NOTE — Telephone Encounter (Signed)
Pt has not been taking her Linzess, pt was informed to start taking this as advised by Plotnikov at her last OV.

## 2017-03-09 NOTE — Telephone Encounter (Signed)
Pt called back-- please return call.  

## 2017-03-29 ENCOUNTER — Telehealth: Payer: Self-pay | Admitting: Internal Medicine

## 2017-03-29 NOTE — Telephone Encounter (Signed)
Pt notified, Patient is concerned about a possible blockage. She stated the has taken the Linzess for 7 says and it has not helped and the miralax in the past did not help.

## 2017-03-29 NOTE — Telephone Encounter (Signed)
Pt called and said that she has been taking the meds that Dr Macario Goldsplot gave her for constipation for over 7 days now and no results.  What should she do?    [phone]

## 2017-03-29 NOTE — Telephone Encounter (Signed)
Linzess 1 a day Miralax 2-6 scoops a day If not better - add OTC Senna 1-2 a day thx

## 2017-03-29 NOTE — Telephone Encounter (Signed)
Please advise 

## 2017-03-30 NOTE — Telephone Encounter (Signed)
OV w/any provider pls Use enema Thx

## 2017-03-30 NOTE — Telephone Encounter (Signed)
Patient would like to speak with Vickie Wilson again about the Linzess. She would like some samples.

## 2017-03-30 NOTE — Telephone Encounter (Signed)
Unable to reach pt

## 2017-03-31 NOTE — Telephone Encounter (Signed)
Pt called back, she has not had any bowel movements in weeks and is worried she would like to know if you have any samples of Linzess Please call back

## 2017-04-01 ENCOUNTER — Other Ambulatory Visit (INDEPENDENT_AMBULATORY_CARE_PROVIDER_SITE_OTHER): Payer: Medicare Other

## 2017-04-01 ENCOUNTER — Encounter: Payer: Self-pay | Admitting: Internal Medicine

## 2017-04-01 ENCOUNTER — Other Ambulatory Visit: Payer: Self-pay | Admitting: Internal Medicine

## 2017-04-01 ENCOUNTER — Ambulatory Visit (INDEPENDENT_AMBULATORY_CARE_PROVIDER_SITE_OTHER): Payer: Medicare Other | Admitting: Internal Medicine

## 2017-04-01 VITALS — BP 124/86 | HR 60 | Ht 60.0 in | Wt 115.0 lb

## 2017-04-01 DIAGNOSIS — N8110 Cystocele, unspecified: Secondary | ICD-10-CM

## 2017-04-01 DIAGNOSIS — R300 Dysuria: Secondary | ICD-10-CM | POA: Diagnosis not present

## 2017-04-01 DIAGNOSIS — I10 Essential (primary) hypertension: Secondary | ICD-10-CM

## 2017-04-01 DIAGNOSIS — K5901 Slow transit constipation: Secondary | ICD-10-CM | POA: Diagnosis not present

## 2017-04-01 LAB — URINALYSIS, ROUTINE W REFLEX MICROSCOPIC
Bilirubin Urine: NEGATIVE
Hgb urine dipstick: NEGATIVE
Ketones, ur: NEGATIVE
Nitrite: NEGATIVE
RBC / HPF: NONE SEEN (ref 0–?)
Specific Gravity, Urine: 1.01 (ref 1.000–1.030)
Total Protein, Urine: NEGATIVE
Urine Glucose: NEGATIVE
Urobilinogen, UA: 0.2 (ref 0.0–1.0)
pH: 8 (ref 5.0–8.0)

## 2017-04-01 MED ORDER — CEPHALEXIN 500 MG PO CAPS: 500.0000 mg | ORAL_CAPSULE | Freq: Three times a day (TID) | ORAL | 0 refills | Status: AC

## 2017-04-01 NOTE — Telephone Encounter (Signed)
Patient scheduled with Vickie RuizJohn

## 2017-04-01 NOTE — Progress Notes (Signed)
Subjective:    Patient ID: Vickie Wilson, female    DOB: Aug 07, 1931, 83 y.o.   MRN: [id number]  HPI  Here with c/o constipation, no BM in 1 wk.  Did see PCP about 2 wks ago, with samples linzess which seemed to help to start, but stopped working with taking the rest of the pills.  Miralax seemed to cause GI upset, and tried Sennakot OTC Also has a pessary due to bladder dropped, wondering if could be related.  No prior hx of significant constipation, no colon cancer and no obstructions.    Last colonoscopy 2011 with Dr Marina Goodell 1 small polyp.   Pt denies fever, wt loss, night sweats, loss of appetite, or other constitutional symptoms,but did have mild dysuria just in the past 2 days, last UTI about aug 2017 with ecoli.   Past Medical History:  Diagnosis Date  . Allergy   . Anxiety   . Atrial mass    right  . Constipation   . Depression   . Diabetes mellitus   . Hyperlipidemia   . Hypertension   . Osteoporosis    Past Surgical History:  Procedure Laterality Date  . DILATION AND CURETTAGE OF UTERUS  1987    reports that she quit smoking about 19 years ago. She has a 20.00 pack-year smoking history. She has never used smokeless tobacco. She reports that she does not drink alcohol or use drugs. family history includes Alcohol abuse in her father; Heart disease in her father and mother; Hyperlipidemia in her other; Hypertension in her other; Stroke in her other. Allergies  Allergen Reactions  . Captopril     REACTION: cough   Current Outpatient Prescriptions on File Prior to Visit  Medication Sig Dispense Refill  . amLODipine (NORVASC) 5 MG tablet TAKE 1 TABLET ONCE DAILY. 90 tablet 3  . aspirin EC 81 MG tablet Take 81 mg by mouth daily.    Marland Kitchen atenolol (TENORMIN) 100 MG tablet TAKE (1/2) TABLET TWICE DAILY. 30 tablet 11  . linaclotide (LINZESS) 290 MCG CAPS capsule Take 1 capsule (290 mcg total) by mouth daily as needed. For constipation 30 capsule 5  . losartan (COZAAR) 100 MG tablet  TAKE 1 TABLET EACH DAY. 90 tablet 3  . Multiple Vitamins-Minerals (CENTRUM SILVER) CHEW Chew 1 each by mouth daily.      Marland Kitchen PARoxetine (PAXIL) 10 MG tablet TAKE 1 TABLET IN THE MORNING. 90 tablet 3  . polyethylene glycol powder (GLYCOLAX/MIRALAX) powder Take 17-34 g by mouth 2 (two) times daily as needed for moderate constipation. 500 g 6  . rosuvastatin (CRESTOR) 20 MG tablet Take 1 tablet (20 mg total) by mouth daily. 30 tablet 5  . triamterene-hydrochlorothiazide (MAXZIDE) 75-50 MG tablet TAKE (1/2) TABLET DAILY IN THE MORNING. 45 tablet 3  . cholecalciferol (VITAMIN D) 1000 UNITS tablet Take 1,000 Units by mouth daily.     Current Facility-Administered Medications on File Prior to Visit  Medication Dose Route Frequency Provider Last Rate Last Dose  . cefTRIAXone (ROCEPHIN) injection 500 mg  500 mg Intramuscular Q24H Plotnikov, Georgina Quint, MD   500 mg at 06/03/16 1224   Review of Systems  Constitutional: Negative for other unusual diaphoresis or sweats HENT: Negative for ear discharge or swelling Eyes: Negative for other worsening visual disturbances Respiratory: Negative for stridor or other swelling  Gastrointestinal: Negative for worsening distension or other blood Genitourinary: Negative for retention or other urinary change Musculoskeletal: Negative for other MSK pain or swelling Skin: Negative  for color change or other new lesions Neurological: Negative for worsening tremors and other numbness  Psychiatric/Behavioral: Negative for worsening agitation or other fatigue All other system neg per pt    Objective:   Physical Exam BP 124/86   Pulse 60   Ht 5' (1.524 m)   Wt 115 lb (52.2 kg)   SpO2 97%   BMI 22.46 kg/m  VS noted,  Constitutional: Pt appears in NAD HENT: Head: NCAT.  Right Ear: External ear normal.  Left Ear: External ear normal.  Eyes: . Pupils are equal, round, and reactive to light. Conjunctivae and EOM are normal Nose: without d/c or deformity Neck: Neck  supple. Gross normal ROM Cardiovascular: Normal rate and regular rhythm.   Pulmonary/Chest: Effort normal and breath sounds without rales or wheezing.  Abd:  Soft, NT, ND, + BS, no organomegaly Neurological: Pt is alert. At baseline orientation, motor grossly intact Skin: Skin is warm. No rashes, other new lesions, no LE edema Psychiatric: Pt behavior is normal without agitation  No other exam findings Lab Results  Component Value Date   WBC 6.2 09/17/2016   HGB 12.4 09/17/2016   HCT 36.3 09/17/2016   PLT 329.0 09/17/2016   GLUCOSE 109 (H) 02/23/2017   CHOL 184 02/23/2017   TRIG 80.0 02/23/2017   HDL 61.60 02/23/2017   LDLDIRECT 125.6 01/16/2013   LDLCALC 106 (H) 02/23/2017   ALT 11 02/23/2017   AST 22 02/23/2017   NA 134 (L) 02/23/2017   K 3.7 02/23/2017   CL 97 02/23/2017   CREATININE 1.14 02/23/2017   BUN 21 02/23/2017   CO2 30 02/23/2017   TSH 2.82 09/17/2016   HGBA1C 6.5 02/23/2017   MICROALBUR 2.8 (H) 09/17/2016       Assessment & Plan:

## 2017-04-01 NOTE — Patient Instructions (Signed)
OK to take Senakot - 1 -2 every day  Please continue all other medications as before, and refills have been done if requested.  Please have the pharmacy call with any other refills you may need.  Please see your GYN for follow up exam related to the pessary  Please keep your appointments with your specialists as you may have planned  Please go to the LAB in the Basement (turn left off the elevator) for the tests to be done today  You will be contacted by phone if any changes need to be made immediately.  Otherwise, you will receive a letter about your results with an explanation, but please check with MyChart first.  Please remember to sign up for MyChart if you have not done so, as this will be important to you in the future with finding out test results, communicating by private email, and scheduling acute appointments online when needed.

## 2017-04-02 ENCOUNTER — Telehealth: Payer: Self-pay | Admitting: Internal Medicine

## 2017-04-02 LAB — URINE CULTURE

## 2017-04-02 NOTE — Telephone Encounter (Signed)
Pt given test results from yesterday 7/19

## 2017-04-04 DIAGNOSIS — I10 Essential (primary) hypertension: Secondary | ICD-10-CM | POA: Insufficient documentation

## 2017-04-04 NOTE — Assessment & Plan Note (Signed)
Mild, for UA and consider uti tx if puria,  to f/u any worsening symptoms or concerns

## 2017-04-04 NOTE — Assessment & Plan Note (Signed)
oks to f/u with GYN for pessary management, consider urology

## 2017-04-04 NOTE — Assessment & Plan Note (Signed)
D/w pt, has been recurring now persistent problem, ok for senakot daily and d/c miralax as not helping, and hold linzess.

## 2017-04-04 NOTE — Assessment & Plan Note (Signed)
stable overall by history and exam, recent data reviewed with pt, and pt to continue medical treatment as before,  to f/u any worsening symptoms or concerns BP Readings from Last 3 Encounters:  04/01/17 124/86  02/18/17 140/78  09/02/16 120/70

## 2017-04-15 ENCOUNTER — Telehealth: Payer: Self-pay | Admitting: Internal Medicine

## 2017-04-15 NOTE — Telephone Encounter (Signed)
Patient calling in regard to results of Urine Culture.  Is requesting call back in regard.

## 2017-04-15 NOTE — Telephone Encounter (Signed)
Please advise 

## 2017-04-15 NOTE — Telephone Encounter (Signed)
The cx is negative - no growth Thx

## 2017-04-16 NOTE — Telephone Encounter (Signed)
Called pt, LVM.   

## 2017-04-26 ENCOUNTER — Other Ambulatory Visit: Payer: Self-pay | Admitting: Internal Medicine

## 2017-04-26 ENCOUNTER — Telehealth: Payer: Self-pay | Admitting: Internal Medicine

## 2017-04-26 MED ORDER — ATENOLOL 100 MG PO TABS: ORAL_TABLET | ORAL | 5 refills | Status: DC

## 2017-04-26 NOTE — Telephone Encounter (Signed)
Pt called requesting a refill on atenolol (TENORMIN) 100 MG tablet [facility]

## 2017-04-26 NOTE — Telephone Encounter (Signed)
Reviewed chart pt is up-to-date [facility] ref[facility]...Vickie Wilson/lmb

## 2017-04-27 ENCOUNTER — Other Ambulatory Visit: Payer: Self-pay | Admitting: Internal Medicine

## 2017-04-27 ENCOUNTER — Other Ambulatory Visit: Payer: Self-pay | Admitting: General Practice

## 2017-04-27 MED ORDER — AMLODIPINE BESYLATE 5 MG PO TABS: 5.0000 mg | ORAL_TABLET | Freq: Every day | ORAL | 0 refills | Status: DC

## 2017-04-27 MED ORDER — ATENOLOL 100 MG PO TABS: ORAL_TABLET | ORAL | 0 refills | Status: DC

## 2017-05-27 ENCOUNTER — Encounter: Payer: Self-pay | Admitting: Internal Medicine

## 2017-05-27 ENCOUNTER — Ambulatory Visit (INDEPENDENT_AMBULATORY_CARE_PROVIDER_SITE_OTHER): Payer: Medicare Other | Admitting: Internal Medicine

## 2017-05-27 ENCOUNTER — Other Ambulatory Visit (INDEPENDENT_AMBULATORY_CARE_PROVIDER_SITE_OTHER): Payer: Medicare Other

## 2017-05-27 ENCOUNTER — Telehealth: Payer: Self-pay | Admitting: Internal Medicine

## 2017-05-27 VITALS — BP 140/88 | HR 65 | Temp 98.4°F | Ht 60.0 in | Wt 115.0 lb

## 2017-05-27 DIAGNOSIS — I10 Essential (primary) hypertension: Secondary | ICD-10-CM

## 2017-05-27 DIAGNOSIS — N390 Urinary tract infection, site not specified: Secondary | ICD-10-CM | POA: Diagnosis not present

## 2017-05-27 DIAGNOSIS — N761 Subacute and chronic vaginitis: Secondary | ICD-10-CM | POA: Diagnosis not present

## 2017-05-27 DIAGNOSIS — Z23 Encounter for immunization: Secondary | ICD-10-CM | POA: Diagnosis not present

## 2017-05-27 DIAGNOSIS — K5901 Slow transit constipation: Secondary | ICD-10-CM | POA: Diagnosis not present

## 2017-05-27 LAB — URINALYSIS, ROUTINE W REFLEX MICROSCOPIC
Bilirubin Urine: NEGATIVE
Ketones, ur: NEGATIVE
Nitrite: NEGATIVE
Specific Gravity, Urine: 1.01 (ref 1.000–1.030)
Total Protein, Urine: NEGATIVE
Urine Glucose: NEGATIVE
Urobilinogen, UA: 0.2 (ref 0.0–1.0)
pH: 7.5 (ref 5.0–8.0)

## 2017-05-27 MED ORDER — CEPHALEXIN 500 MG PO CAPS: 500.0000 mg | ORAL_CAPSULE | Freq: Three times a day (TID) | ORAL | 0 refills | Status: AC

## 2017-05-27 MED ORDER — TERCONAZOLE 0.4 % VA CREA: 1.0000 | TOPICAL_CREAM | Freq: Every day | VAGINAL | 0 refills | Status: DC

## 2017-05-27 NOTE — Telephone Encounter (Signed)
Patient states [name] told her that he would have her results by the end of day.  She [facility] really [facility]  She states she does not want to go through the night.

## 2017-05-27 NOTE — Telephone Encounter (Signed)
Ok for cephalexin asd - done erx 

## 2017-05-27 NOTE — Telephone Encounter (Signed)
See below

## 2017-05-27 NOTE — Telephone Encounter (Signed)
Notified patient.

## 2017-05-27 NOTE — Progress Notes (Signed)
Subjective:    Patient ID: Murlean Caller, female    DOB: 1931/04/28, 83 y.o.   MRN: [id number]  HPI  Here to f/u with acute visit, c/o 2 days onset dysuria with urgency but Denies urinary symptoms such as dysuria, but no urgency, flank pain, hematuria or n/v, fever, chills. Just feels terrible.  Constipation discussed at last visit much improved with the linzess, only has to use 1-2 times per wk now.  Does also have itchy vaginal dc and some mild discomfort Denies worsening reflux, other abd pain, dysphagia, n/v, bowel change or blood  Due for flu shot. Past Medical History:  Diagnosis Date  . Allergy   . Anxiety   . Atrial mass    right  . Constipation   . Depression   . Diabetes mellitus   . Hyperlipidemia   . Hypertension   . Osteoporosis    Past Surgical History:  Procedure Laterality Date  . DILATION AND CURETTAGE OF UTERUS  1987    reports that she quit smoking about 19 years ago. She has a 20.00 pack-year smoking history. She has never used smokeless tobacco. She reports that she does not drink alcohol or use drugs. family history includes Alcohol abuse in her father; Heart disease in her father and mother; Hyperlipidemia in her other; Hypertension in her other; Stroke in her other. Allergies  Allergen Reactions  . Captopril     REACTION: cough   Current Outpatient Prescriptions on File Prior to Visit  Medication Sig Dispense Refill  . amLODipine (NORVASC) 5 MG tablet Take 1 tablet (5 mg total) by mouth daily. 90 tablet 0  . aspirin EC 81 MG tablet Take 81 mg by mouth daily.    Marland Kitchen atenolol (TENORMIN) 100 MG tablet TAKE (1/2) TABLET TWICE DAILY. 90 tablet 0  . linaclotide (LINZESS) 290 MCG CAPS capsule Take 1 capsule (290 mcg total) by mouth daily as needed. For constipation 30 capsule 5  . losartan (COZAAR) 100 MG tablet TAKE 1 TABLET EACH DAY. 90 tablet 3  . Multiple Vitamins-Minerals (CENTRUM SILVER) CHEW Chew 1 each by mouth daily.      Marland Kitchen PARoxetine (PAXIL) 10 MG  tablet TAKE 1 TABLET IN THE MORNING. 90 tablet 3  . polyethylene glycol powder (GLYCOLAX/MIRALAX) powder Take 17-34 g by mouth 2 (two) times daily as needed for moderate constipation. 500 g 6  . rosuvastatin (CRESTOR) 20 MG tablet Take 1 tablet (20 mg total) by mouth daily. 30 tablet 5  . triamterene-hydrochlorothiazide (MAXZIDE) 75-50 MG tablet TAKE (1/2) TABLET DAILY IN THE MORNING. 45 tablet 3  . cholecalciferol (VITAMIN D) 1000 UNITS tablet Take 1,000 Units by mouth daily.     No current facility-administered medications on file prior to visit.    Review of Systems  Constitutional: Negative for other unusual diaphoresis or sweats HENT: Negative for ear discharge or swelling Eyes: Negative for other worsening visual disturbances Respiratory: Negative for stridor or other swelling  Gastrointestinal: Negative for worsening distension or other blood Genitourinary: Negative for retention or other urinary change Musculoskeletal: Negative for other MSK pain or swelling Skin: Negative for color change or other new lesions Neurological: Negative for worsening tremors and other numbness  Psychiatric/Behavioral: Negative for worsening agitation or other fatigue All other system neg per pt    Objective:   Physical Exam BP 140/88   Pulse 65   Temp 98.4 F (36.9 C) (Oral)   Ht 5' (1.524 m)   Wt 115 lb (52.2 kg)  SpO2 98%   BMI 22.46 kg/m  VS noted,  Constitutional: Pt appears in NAD HENT: Head: NCAT.  Right Ear: External ear normal.  Left Ear: External ear normal.  Eyes: . Pupils are equal, round, and reactive to light. Conjunctivae and EOM are normal Nose: without d/c or deformity Neck: Neck supple. Gross normal ROM Cardiovascular: Normal rate and regular rhythm.   Pulmonary/Chest: Effort normal and breath sounds without rales or wheezing.  Abd:  Soft, with low mid abd tender without guarding or rebound, ND, + BS, no organomegaly Neurological: Pt is alert. At baseline orientation,  motor grossly intact Skin: Skin is warm. No rashes, other new lesions, no LE edema Psychiatric: Pt behavior is normal without agitation  No other exam findings        Assessment & Plan:

## 2017-05-27 NOTE — Patient Instructions (Signed)
Your specimen will be sent to the Lab  We will call if it appears that you might have an infection  The culture normally takes 2-3 days to result  Please take all new medication as prescribed - the anti-fungal cream  Please continue all other medications as before, and refills have been done if requested.  Please have the pharmacy call with any other refills you may need.  Please keep your appointments with your specialists as you may have planned

## 2017-05-28 DIAGNOSIS — N760 Acute vaginitis: Secondary | ICD-10-CM | POA: Insufficient documentation

## 2017-05-28 DIAGNOSIS — N390 Urinary tract infection, site not specified: Secondary | ICD-10-CM | POA: Insufficient documentation

## 2017-05-28 NOTE — Assessment & Plan Note (Signed)
Symptomatically resolved, to cont linzess prn

## 2017-05-28 NOTE — Assessment & Plan Note (Signed)
Presumed fungal, for terconozole cr asd, f/u with GYN if not improved

## 2017-05-28 NOTE — Assessment & Plan Note (Signed)
UTI likely given exam, for urine studies, and empiric antibx,  to f/u any worsening symptoms or concerns

## 2017-05-28 NOTE — Assessment & Plan Note (Signed)
stable overall by history and exam, recent data reviewed with pt, and pt to continue medical treatment as before,  to f/u any worsening symptoms or concerns BP Readings from Last 3 Encounters:  05/27/17 140/88  04/01/17 124/86  02/18/17 140/78

## 2017-05-31 ENCOUNTER — Telehealth: Payer: Self-pay | Admitting: Internal Medicine

## 2017-05-31 DIAGNOSIS — N390 Urinary tract infection, site not specified: Secondary | ICD-10-CM

## 2017-05-31 NOTE — Telephone Encounter (Signed)
Pt called stating she does not believe the antibiotics for her UTI is working well and she would like to know the results of her Urine culture and she would like a call back. Please advise in Dr. Melvyn Novas absence

## 2017-05-31 NOTE — Telephone Encounter (Signed)
Kathie Rhodes can you help me out with this? Thanks! I didn't mean to send it to him.

## 2017-05-31 NOTE — Telephone Encounter (Signed)
Cx was non-informative. Repeat UA and Cx Thx

## 2017-06-01 NOTE — Telephone Encounter (Signed)
Pt notified and she states she is also going to see a GYN to see if this issue is related to something else.

## 2017-06-02 ENCOUNTER — Other Ambulatory Visit (INDEPENDENT_AMBULATORY_CARE_PROVIDER_SITE_OTHER): Payer: Medicare Other

## 2017-06-02 DIAGNOSIS — N390 Urinary tract infection, site not specified: Secondary | ICD-10-CM | POA: Diagnosis not present

## 2017-06-02 LAB — URINALYSIS, ROUTINE W REFLEX MICROSCOPIC
Bilirubin Urine: NEGATIVE
Nitrite: NEGATIVE
Specific Gravity, Urine: 1.02 (ref 1.000–1.030)
Total Protein, Urine: 100 — AB
Urine Glucose: NEGATIVE
Urobilinogen, UA: 0.2 (ref 0.0–1.0)
pH: 6.5 (ref 5.0–8.0)

## 2017-06-04 ENCOUNTER — Other Ambulatory Visit: Payer: Self-pay | Admitting: Internal Medicine

## 2017-06-04 LAB — URINE CULTURE
MICRO NUMBER:: 81035696
SPECIMEN QUALITY:: ADEQUATE

## 2017-06-04 MED ORDER — CIPROFLOXACIN HCL 250 MG PO TABS: 250.0000 mg | ORAL_TABLET | Freq: Two times a day (BID) | ORAL | 0 refills | Status: DC

## 2017-06-16 ENCOUNTER — Telehealth: Payer: Self-pay | Admitting: Emergency Medicine

## 2017-06-16 DIAGNOSIS — N819 Female genital prolapse, unspecified: Secondary | ICD-10-CM | POA: Diagnosis not present

## 2017-06-16 NOTE — Telephone Encounter (Signed)
Up front for pt to pick up.

## 2017-06-16 NOTE — Telephone Encounter (Signed)
Pt called and stated she has an appointment with her GYN today. She is asking if she can get a print out of her urine cultures to take with her to this appt. She will come by to pick this up later this afternoon if possible. Thanks.

## 2017-07-23 ENCOUNTER — Other Ambulatory Visit: Payer: Self-pay | Admitting: Internal Medicine

## 2017-07-31 ENCOUNTER — Ambulatory Visit (HOSPITAL_COMMUNITY)
Admission: EM | Admit: 2017-07-31 | Discharge: 2017-07-31 | Disposition: A | Discharge status: Home / Self Care | Payer: Medicare Other | Attending: Family Medicine | Admitting: Family Medicine

## 2017-07-31 ENCOUNTER — Encounter (HOSPITAL_COMMUNITY): Payer: Self-pay | Admitting: Family Medicine

## 2017-07-31 ENCOUNTER — Telehealth: Payer: Self-pay | Admitting: *Deleted

## 2017-07-31 DIAGNOSIS — Z23 Encounter for immunization: Secondary | ICD-10-CM

## 2017-07-31 DIAGNOSIS — T23211A Burn of second degree of right thumb (nail), initial encounter: Secondary | ICD-10-CM

## 2017-07-31 MED ORDER — TETANUS-DIPHTH-ACELL PERTUSSIS 5-2.5-18.5 LF-MCG/0.5 IM SUSP
0.5000 mL | Freq: Once | INTRAMUSCULAR | Status: AC
  Administered 2017-07-31: 0.5 mL via INTRAMUSCULAR

## 2017-07-31 MED ORDER — SILVER SULFADIAZINE 1 % EX CREA: 1.0000 "application " | TOPICAL_CREAM | Freq: Every day | CUTANEOUS | 0 refills | Status: DC

## 2017-07-31 MED ORDER — TETANUS-DIPHTH-ACELL PERTUSSIS 5-2.5-18.5 LF-MCG/0.5 IM SUSP
INTRAMUSCULAR | Status: AC
  Filled 2017-07-31: qty 0.5

## 2017-07-31 MED ORDER — CEPHALEXIN 500 MG PO CAPS
500.0000 mg | ORAL_CAPSULE | Freq: Four times a day (QID) | ORAL | 0 refills | Status: DC
Start: 2017-07-31 — End: 2017-11-19

## 2017-07-31 NOTE — ED Notes (Signed)
Wound care instructions discussed. Silvadene placed to burn on right hand. bandaids given to patient for home dressing changes.

## 2017-07-31 NOTE — ED Triage Notes (Signed)
Pt here for possible infection to right hand after burning it a few days ago.

## 2017-07-31 NOTE — ED Provider Notes (Signed)
[facility]    CSN: [id number] Arrival date & time: 07/31/17  1157     History   Chief Complaint Chief Complaint  Patient presents with  . Hand Problem    HPI Vickie Wilson is a 83 y.o. female.   [address]89-year-old female states she received a small thermal burn to the base of the right thumb near the thenar eminence about 5 days ago. She has no pain. This is a small oval area she was concerned because of some puffiness and surrounding erythema. Last T data 10 years or greater. No other complaints.      Past Medical History:  Diagnosis Date  . Allergy   . Anxiety   . Atrial mass    right  . Constipation   . Depression   . Diabetes mellitus   . Hyperlipidemia   . Hypertension   . Osteoporosis     Patient Active Problem List   Diagnosis Date Noted  . Urinary tract infection without hematuria 05/28/2017  . Vaginitis 05/28/2017  . HTN (hypertension) 04/04/2017  . Female bladder prolapse 04/01/2017  . Dysuria 04/22/2016  . Dry skin dermatitis 09/04/2015  . Cerumen impaction 09/04/2015  . Cataract 02/01/2015  . Burn of hand, left 06/22/2014  . Calf pain 07/14/2013  . Tricuspid regurgitation   . Well adult exam 01/18/2013  . Nonspecific abnormal electrocardiogram (ECG) (EKG) 01/18/2013  . Heart murmur 01/18/2013  . Superficial phlebitis 10/21/2012  . Zoster 10/21/2012  . Hives 02/23/2012  . Arthralgia 09/01/2011  . Foot pain, right 04/22/2011  . CHANGE IN BOWELS 06/17/2010  . BLADDER PROLAPSE 01/28/2010  . Cough 01/28/2010  . ABDOMINAL BLOATING 07/12/2009  . TOBACCO USE, QUIT 07/12/2009  . ECZEMA 10/31/2008  . Dyslipidemia 06/28/2008  . Constipation 04/20/2008  . BLEPHARITIS 11/23/2007  . Actinic keratosis 09/05/2007  . Diabetic on diet only (HCC) 08/01/2007  . Anxiety state 06/29/2007  . Depression 06/29/2007  . Hypertensive renal disease 06/29/2007  . ALLERGIC RHINITIS 06/29/2007  . OSTEOPOROSIS 06/29/2007    Past Surgical History:    Procedure Laterality Date  . DILATION AND CURETTAGE OF UTERUS  1987    OB History    No data available       Home Medications    Prior to Admission medications   Medication Sig Start Date End Date Taking? Authorizing Provider  amLODipine (NORVASC) 5 MG tablet TAKE 1 TABLET ONCE DAILY. 07/26/17   Plotnikov, Georgina QuintAleksei V, MD  aspirin EC 81 MG tablet Take 81 mg by mouth daily.    [provider]  atenolol (TENORMIN) 100 MG tablet TAKE (1/2) TABLET TWICE DAILY. 04/27/17   Plotnikov, Georgina QuintAleksei V, MD  cephALEXin (KEFLEX) 500 MG capsule Take 1 capsule (500 mg total) 4 (four) times daily by mouth. 07/31/17   Hayden RasmussenMabe, Ronne Savoia, NP  cholecalciferol (VITAMIN D) 1000 UNITS tablet Take 1,000 Units by mouth daily. 12/17/10 07/14/13  Plotnikov, Georgina QuintAleksei V, MD  ciprofloxacin (CIPRO) 250 MG tablet Take 1 tablet (250 mg total) by mouth 2 (two) times daily. 06/04/17   Plotnikov, Georgina QuintAleksei V, MD  linaclotide (LINZESS) 290 MCG CAPS capsule Take 1 capsule (290 mcg total) by mouth daily as needed. For constipation 02/18/17   Plotnikov, Georgina QuintAleksei V, MD  losartan (COZAAR) 100 MG tablet TAKE 1 TABLET EACH DAY. 07/26/17   Plotnikov, Georgina QuintAleksei V, MD  Multiple Vitamins-Minerals (CENTRUM SILVER) CHEW Chew 1 each by mouth daily.      [provider]  PARoxetine (PAXIL) 10 MG tablet TAKE 1  TABLET IN THE MORNING. 09/08/16   Plotnikov, Georgina Quint, MD  polyethylene glycol powder (GLYCOLAX/MIRALAX) powder Take 17-34 g by mouth 2 (two) times daily as needed for moderate constipation. 02/18/17   Plotnikov, Georgina Quint, MD  rosuvastatin (CRESTOR) 20 MG tablet Take 1 tablet (20 mg total) by mouth daily. 12/31/16   Plotnikov, Georgina Quint, MD  silver sulfADIAZINE (SILVADENE) 1 % cream Apply 1 application daily topically. 07/31/17   Hayden Rasmussen, NP  terconazole (TERAZOL 7) 0.4 % vaginal cream Place 1 applicator vaginally at bedtime. 05/27/17   Corwin Levins, MD  triamterene-hydrochlorothiazide (MAXZIDE) 75-50 MG tablet TAKE (1/2) TABLET DAILY  IN THE MORNING. 10/12/16   Plotnikov, Georgina Quint, MD    Family History Family History  Problem Relation Age of Onset  . Heart disease Mother   . Alcohol abuse Father   . Heart disease Father   . Hyperlipidemia Other   . Hypertension Other   . Stroke Other     Social History Social History   Tobacco Use  . Smoking status: Former Smoker    Packs/day: 1.00    Years: 20.00    Pack years: 20.00    Last attempt to quit: 03/13/1998    Years since quitting: 19.3  . Smokeless tobacco: Never Used  Substance Use Topics  . Alcohol use: No  . Drug use: No     Allergies   Captopril   Review of Systems Review of Systems  Constitutional: Negative.   Gastrointestinal: Negative.   Skin: Positive for wound.  Neurological: Negative.   All other systems reviewed and are negative.    Physical Exam Triage Vital Signs ED Triage Vitals [07/31/17 1210]  Enc Vitals Group     BP (!) 168/70     Pulse Rate (!) 57     Resp 18     Temp 97.7 F (36.5 C)     Temp Source Oral     SpO2 100 %     Weight      Height      Head Circumference      Peak Flow      Pain Score      Pain Loc      Pain Edu?      Excl. in GC?    No data found.  Updated Vital Signs BP (!) 168/70 (BP Location: Left Arm) Comment: notified T Bast  Pulse (!) 57   Temp 97.7 F (36.5 C) (Oral)   Resp 18   SpO2 100%   Visual Acuity Right Eye Distance:   Left Eye Distance:   Bilateral Distance:    Right Eye Near:   Left Eye Near:    Bilateral Near:     Physical Exam  Constitutional: She is oriented to person, place, and time. She appears well-developed and well-nourished. No distress.  Eyes: EOM are normal.  Neck: Normal range of motion. Neck supple.  Cardiovascular: Normal rate.  Pulmonary/Chest: Effort normal. No respiratory distress.  Musculoskeletal: She exhibits no edema.  Neurological: She is alert and oriented to person, place, and time. She exhibits normal muscle tone.  Skin: Skin is warm and  dry.  Approximately 2.5 x 1 cm oval first-degree healing urn to the base of the right thumb near the thenar eminence. Mild surrounding erythema. No lymphangitis. No current drainage. Some puffiness.Normal surrounding sensation and ful ROM thumb and hand.  Psychiatric: She has a normal mood and affect. Her behavior is normal.  Nursing note and vitals reviewed.  UC Treatments / Results  Labs (all labs ordered are listed, but only abnormal results are displayed) Labs Reviewed - No data to display  EKG  EKG Interpretation None       Radiology No results found.  Procedures Procedures (including critical care time)  Medications Ordered in UC Medications  Tdap (BOOSTRIX) injection 0.5 mL (not administered)   Silvadene dressing and Tdap befor eD/C  Initial Impression / Assessment and Plan / UC Course  I have reviewed the triage vital signs and the nursing notes.  Pertinent labs & imaging results that were available during my care of the patient were reviewed by me and considered in my medical decision making (see chart for details).    Take care of wound as discussed and read current instructions. Worsening may return.     Final Clinical Impressions(s) / UC Diagnoses   Final diagnoses:  Partial thickness burn of right thumb, initial encounter    ED Discharge Orders        Ordered    silver sulfADIAZINE (SILVADENE) 1 % cream  Daily     07/31/17 1238    cephALEXin (KEFLEX) 500 MG capsule  4 times daily     07/31/17 1238       Controlled Substance Prescriptions [place] Controlled Substance Registry consulted? Not Applicable   Hayden RasmussenMabe, Jediah Horger, NP 07/31/17 1240

## 2017-07-31 NOTE — Discharge Instructions (Signed)
Take care of wound as discussed and read current instructions. Worsening may return.

## 2017-07-31 NOTE — Telephone Encounter (Signed)
Pt walked into the office wanting to be seen for burn on right hand below thumb area. I looked at wound, size of half dollar, red on edges of wound with yellow area in the middle. Told pt [facility] looks like it might be getting infected. We are unable to see her here today due to full schedule. Pt verbalized understanding and will go to Urgent Care.

## 2017-08-03 DIAGNOSIS — H1132 Conjunctival hemorrhage, left eye: Secondary | ICD-10-CM | POA: Diagnosis not present

## 2017-08-11 ENCOUNTER — Ambulatory Visit (INDEPENDENT_AMBULATORY_CARE_PROVIDER_SITE_OTHER): Payer: Medicare Other | Admitting: Internal Medicine

## 2017-08-11 ENCOUNTER — Encounter: Payer: Self-pay | Admitting: Internal Medicine

## 2017-08-11 VITALS — BP 158/92 | HR 52 | Temp 97.7°F | Ht 60.0 in | Wt 113.0 lb

## 2017-08-11 DIAGNOSIS — F411 Generalized anxiety disorder: Secondary | ICD-10-CM | POA: Diagnosis not present

## 2017-08-11 DIAGNOSIS — Z23 Encounter for immunization: Secondary | ICD-10-CM | POA: Diagnosis not present

## 2017-08-11 DIAGNOSIS — K5901 Slow transit constipation: Secondary | ICD-10-CM | POA: Diagnosis not present

## 2017-08-11 DIAGNOSIS — R0989 Other specified symptoms and signs involving the circulatory and respiratory systems: Secondary | ICD-10-CM | POA: Insufficient documentation

## 2017-08-11 DIAGNOSIS — I10 Essential (primary) hypertension: Secondary | ICD-10-CM | POA: Diagnosis not present

## 2017-08-11 DIAGNOSIS — I700 Atherosclerosis of aorta: Secondary | ICD-10-CM | POA: Diagnosis not present

## 2017-08-11 NOTE — Assessment & Plan Note (Signed)
Mild on CT

## 2017-08-11 NOTE — Assessment & Plan Note (Signed)
BP Readings from Last 3 Encounters:  08/11/17 (!) 158/92  07/31/17 (!) 168/70  05/27/17 140/88  We discussed BP. Check BP. Increase Paxil to 1 a day

## 2017-08-11 NOTE — Patient Instructions (Signed)
MC well w/Jill 

## 2017-08-11 NOTE — Assessment & Plan Note (Signed)
Paxil 

## 2017-08-11 NOTE — Addendum Note (Signed)
Addended by: Scarlett PrestoFRIEDENBACH, Verlena Marlette on: 08/11/2017 11:39 AM   Modules accepted: Orders

## 2017-08-11 NOTE — Assessment & Plan Note (Addendum)
Discussed  Pessary was re-adjusted - it helped. Fiber

## 2017-08-11 NOTE — Assessment & Plan Note (Signed)
Doppler US 

## 2017-08-11 NOTE — Progress Notes (Signed)
Subjective:  Patient ID: Vickie Wilson, female    DOB: 02/01/1931  Age: 83 y.o. MRN: [id number]  CC: No chief complaint on file.   HPI Vickie CallerJackie M Wilson presents for HTN, PVD, constipation, anxiety f/u  Outpatient Medications Prior to Visit  Medication Sig Dispense Refill  . amLODipine (NORVASC) 5 MG tablet TAKE 1 TABLET ONCE DAILY. 90 tablet 1  . aspirin EC 81 MG tablet Take 81 mg by mouth daily.    Marland Kitchen. atenolol (TENORMIN) 100 MG tablet TAKE (1/2) TABLET TWICE DAILY. 90 tablet 0  . cephALEXin (KEFLEX) 500 MG capsule Take 1 capsule (500 mg total) 4 (four) times daily by mouth. 24 capsule 0  . ciprofloxacin (CIPRO) 250 MG tablet Take 1 tablet (250 mg total) by mouth 2 (two) times daily. 14 tablet 0  . linaclotide (LINZESS) 290 MCG CAPS capsule Take 1 capsule (290 mcg total) by mouth daily as needed. For constipation 30 capsule 5  . losartan (COZAAR) 100 MG tablet TAKE 1 TABLET EACH DAY. 90 tablet 1  . Multiple Vitamins-Minerals (CENTRUM SILVER) CHEW Chew 1 each by mouth daily.      Marland Kitchen. PARoxetine (PAXIL) 10 MG tablet TAKE 1 TABLET IN THE MORNING. 90 tablet 3  . polyethylene glycol powder (GLYCOLAX/MIRALAX) powder Take 17-34 g by mouth 2 (two) times daily as needed for moderate constipation. 500 g 6  . rosuvastatin (CRESTOR) 20 MG tablet Take 1 tablet (20 mg total) by mouth daily. 30 tablet 5  . silver sulfADIAZINE (SILVADENE) 1 % cream Apply 1 application daily topically. 20 g 0  . terconazole (TERAZOL 7) 0.4 % vaginal cream Place 1 applicator vaginally at bedtime. 45 g 0  . triamterene-hydrochlorothiazide (MAXZIDE) 75-50 MG tablet TAKE (1/2) TABLET DAILY IN THE MORNING. 45 tablet 3  . cholecalciferol (VITAMIN D) 1000 UNITS tablet Take 1,000 Units by mouth daily.     No facility-administered medications prior to visit.     ROS Review of Systems  Constitutional: Negative for activity change, appetite change, chills, fatigue and unexpected weight change.  HENT: Negative for congestion,  mouth sores and sinus pressure.   Eyes: Negative for visual disturbance.  Respiratory: Negative for cough and chest tightness.   Gastrointestinal: Negative for abdominal pain and nausea.  Genitourinary: Negative for difficulty urinating, frequency and vaginal pain.  Musculoskeletal: Negative for back pain and gait problem.  Skin: Negative for pallor and rash.  Neurological: Negative for dizziness, tremors, weakness, numbness and headaches.  Psychiatric/Behavioral: Negative for confusion, sleep disturbance and suicidal ideas. The patient is nervous/anxious.     Objective:  BP (!) 158/92 (BP Location: Left Arm, Patient Position: Sitting, Cuff Size: Normal)   Pulse (!) 52   Temp 97.7 F (36.5 C) (Oral)   Ht 5' (1.524 m)   Wt 113 lb (51.3 kg)   SpO2 99%   BMI 22.07 kg/m   BP Readings from Last 3 Encounters:  08/11/17 (!) 158/92  07/31/17 (!) 168/70  05/27/17 140/88    Wt Readings from Last 3 Encounters:  08/11/17 113 lb (51.3 kg)  05/27/17 115 lb (52.2 kg)  04/01/17 115 lb (52.2 kg)    Physical Exam  Constitutional: She appears well-developed. No distress.  HENT:  Head: Normocephalic.  Right Ear: External ear normal.  Left Ear: External ear normal.  Nose: Nose normal.  Mouth/Throat: Oropharynx is clear and moist.  Eyes: Conjunctivae are normal. Pupils are equal, round, and reactive to light. Right eye exhibits no discharge. Left eye exhibits no discharge.  Neck: Normal range of motion. Neck supple. No JVD present. No tracheal deviation present. No thyromegaly present.  Cardiovascular: Normal rate, regular rhythm and normal heart sounds.  Pulmonary/Chest: No stridor. No respiratory distress. She has no wheezes.  Abdominal: Soft. Bowel sounds are normal. She exhibits no distension and no mass. There is no tenderness. There is no rebound and no guarding.  Musculoskeletal: She exhibits no edema or tenderness.  Lymphadenopathy:    She has no cervical adenopathy.    Neurological: She displays normal reflexes. No cranial nerve deficit. She exhibits normal muscle tone. Coordination normal.  Skin: No rash noted. No erythema.  Psychiatric: She has a normal mood and affect. Her behavior is normal. Judgment and thought content normal.  R bruit mild R hand burn is healing Lab Results  Component Value Date   WBC 6.2 09/17/2016   HGB 12.4 09/17/2016   HCT 36.3 09/17/2016   PLT 329.0 09/17/2016   GLUCOSE 109 (H) 02/23/2017   CHOL 184 02/23/2017   TRIG 80.0 02/23/2017   HDL 61.60 02/23/2017   LDLDIRECT 125.6 01/16/2013   LDLCALC 106 (H) 02/23/2017   ALT 11 02/23/2017   AST 22 02/23/2017   NA 134 (L) 02/23/2017   K 3.7 02/23/2017   CL 97 02/23/2017   CREATININE 1.14 02/23/2017   BUN 21 02/23/2017   CO2 30 02/23/2017   TSH 2.82 09/17/2016   HGBA1C 6.5 02/23/2017   MICROALBUR 2.8 (H) 09/17/2016    No results found.  Assessment & Plan:   There are no diagnoses linked to this encounter. I am having Vickie Wilson maintain her CENTRUM SILVER, cholecalciferol, aspirin EC, PARoxetine, triamterene-hydrochlorothiazide, rosuvastatin, polyethylene glycol powder, linaclotide, atenolol, terconazole, ciprofloxacin, amLODipine, losartan, silver sulfADIAZINE, and cephALEXin.  No orders of the defined types were placed in this encounter.    Follow-up: No Follow-up on file.  Sonda PrimesAlex Daine Gunther, MD

## 2017-08-24 ENCOUNTER — Ambulatory Visit: Payer: Medicare Other | Admitting: Internal Medicine

## 2017-08-31 ENCOUNTER — Other Ambulatory Visit: Payer: Medicare Other

## 2017-09-21 DIAGNOSIS — H2511 Age-related nuclear cataract, right eye: Secondary | ICD-10-CM | POA: Diagnosis not present

## 2017-09-24 ENCOUNTER — Other Ambulatory Visit: Payer: Self-pay | Admitting: Internal Medicine

## 2017-10-06 ENCOUNTER — Other Ambulatory Visit: Payer: Self-pay

## 2017-10-06 MED ORDER — ROSUVASTATIN CALCIUM 20 MG PO TABS: 10.0000 mg | ORAL_TABLET | Freq: Every day | ORAL | 3 refills | Status: DC

## 2017-11-18 ENCOUNTER — Ambulatory Visit: Payer: Self-pay | Admitting: *Deleted

## 2017-11-18 NOTE — Telephone Encounter (Signed)
Pt   Reports  sorethroat   Started  yest   With  Some  Discomfort  When she  Swallows     She  Reports  It  Is  Actually better  Today .  No  Difficulty  Swallowing    Denies  Any  Fever.   Pt given  Home  Care instructions  Was   Advised to  Call  Back if symptoms  Worse.   Reason for Disposition . [1] Sore throat is the only symptom AND [2] sore throat present < 48 hours  Answer Assessment - Initial Assessment Questions 1. ONSET: "When did the throat start hurting?" (Hours or days ago)        Yesterday 2. SEVERITY: "How bad is the sore throat?" (Scale 1-10; mild, moderate or severe)   - MILD (1-3):  doesn't interfere with eating or normal activities   - MODERATE (4-7): interferes with eating some solids and normal activities   - SEVERE (8-10):  excruciating pain, interferes with most normal activities   - SEVERE DYSPHAGIA: can't swallow liquids, drooling      mild 3. STREP EXPOSURE: "Has there been any exposure to strep within the past week?" If so, ask: "What type of contact occurred?"        Not  She  Is  Aware  Of    4.  VIRAL SYMPTOMS: "Are there any symptoms of a cold, such as a runny nose, cough, hoarse voice or red eyes?"         Random   Cough  Maybe   enviormental   and been going  on  For a     5. FEVER: "Do you have a fever?" If so, ask: "What is your temperature, how was it measured, and when did it start?"          Does  Not think  So   6. PUS ON THE TONSILS: "Is there pus on the tonsils in the back of your throat?"           Nothing  Seen   7. OTHER SYMPTOMS: "Do you have any other symptoms?" (e.g., difficulty breathing, headache, rash)            no 8. PREGNANCY: "Is there any chance you are pregnant?" "When was your last menstrual period?"     n/a  Protocols used: SORE THROAT-A-AH

## 2017-11-19 ENCOUNTER — Encounter: Payer: Self-pay | Admitting: Family

## 2017-11-19 ENCOUNTER — Ambulatory Visit (INDEPENDENT_AMBULATORY_CARE_PROVIDER_SITE_OTHER): Payer: Medicare Other | Admitting: Family

## 2017-11-19 VITALS — BP 130/72 | HR 64 | Temp 99.0°F | Ht 60.0 in | Wt 111.1 lb

## 2017-11-19 DIAGNOSIS — J209 Acute bronchitis, unspecified: Secondary | ICD-10-CM | POA: Diagnosis not present

## 2017-11-19 MED ORDER — AZITHROMYCIN 250 MG PO TABS: ORAL_TABLET | ORAL | 0 refills | Status: DC

## 2017-11-19 NOTE — Progress Notes (Signed)
Vickie Wilson is a 84 y.o. female with the following history as recorded in [employer]:  Patient Active Problem List   Diagnosis Date Noted  . Aortic atherosclerosis (HCC) 08/11/2017  . Carotid bruit 08/11/2017  . Urinary tract infection without hematuria 05/28/2017  . Vaginitis 05/28/2017  . HTN (hypertension) 04/04/2017  . Female bladder prolapse 04/01/2017  . Dysuria 04/22/2016  . Dry skin dermatitis 09/04/2015  . Cerumen impaction 09/04/2015  . Cataract 02/01/2015  . Burn of hand, left 06/22/2014  . Calf pain 07/14/2013  . Tricuspid regurgitation   . Well adult exam 01/18/2013  . Nonspecific abnormal electrocardiogram (ECG) (EKG) 01/18/2013  . Heart murmur 01/18/2013  . Superficial phlebitis 10/21/2012  . Zoster 10/21/2012  . Hives 02/23/2012  . Arthralgia 09/01/2011  . Foot pain, right 04/22/2011  . CHANGE IN BOWELS 06/17/2010  . BLADDER PROLAPSE 01/28/2010  . Cough 01/28/2010  . ABDOMINAL BLOATING 07/12/2009  . TOBACCO USE, QUIT 07/12/2009  . ECZEMA 10/31/2008  . Dyslipidemia 06/28/2008  . Constipation 04/20/2008  . BLEPHARITIS 11/23/2007  . Actinic keratosis 09/05/2007  . Diabetic on diet only (HCC) 08/01/2007  . Anxiety state 06/29/2007  . Depression 06/29/2007  . Hypertensive renal disease 06/29/2007  . ALLERGIC RHINITIS 06/29/2007  . OSTEOPOROSIS 06/29/2007    Current Outpatient Medications  Medication Sig Dispense Refill  . amLODipine (NORVASC) 5 MG tablet TAKE 1 TABLET ONCE DAILY. 90 tablet 1  . aspirin EC 81 MG tablet Take 81 mg by mouth daily.    Marland Kitchen. atenolol (TENORMIN) 100 MG tablet TAKE (1/2) TABLET TWICE DAILY. 90 tablet 0  . losartan (COZAAR) 100 MG tablet TAKE 1 TABLET EACH DAY. 90 tablet 1  . Multiple Vitamins-Minerals (CENTRUM SILVER) CHEW Chew 1 each by mouth daily.      Marland Kitchen. PARoxetine (PAXIL) 10 MG tablet TAKE 1 TABLET IN THE MORNING. 90 tablet 3  . polyethylene glycol powder (GLYCOLAX/MIRALAX) powder Take 17-34 g by mouth 2 (two) times daily as  needed for moderate constipation. 500 g 6  . rosuvastatin (CRESTOR) 20 MG tablet Take 0.5 tablets (10 mg total) by mouth daily. 45 tablet 3  . silver sulfADIAZINE (SILVADENE) 1 % cream Apply 1 application daily topically. 20 g 0  . triamterene-hydrochlorothiazide (MAXZIDE) 75-50 MG tablet TAKE (1/2) TABLET DAILY IN THE MORNING. 45 tablet 1  . azithromycin (ZITHROMAX) 250 MG tablet 2 tabs po qd x 1 day; 1 tablet per day x 4 days; 6 tablet 0  . cholecalciferol (VITAMIN D) 1000 UNITS tablet Take 1,000 Units by mouth daily.     No current facility-administered medications for this visit.     Allergies: Captopril  Past Medical History:  Diagnosis Date  . Allergy   . Anxiety   . Atrial mass    right  . Constipation   . Depression   . Diabetes mellitus   . Hyperlipidemia   . Hypertension   . Osteoporosis     Past Surgical History:  Procedure Laterality Date  . DILATION AND CURETTAGE OF UTERUS  1987    Family History  Problem Relation Age of Onset  . Heart disease Mother   . Alcohol abuse Father   . Heart disease Father   . Hyperlipidemia Other   . Hypertension Other   . Stroke Other     Social History   Tobacco Use  . Smoking status: Former Smoker    Packs/day: 1.00    Years: 20.00    Pack years: 20.00    Last attempt  to quit: 03/13/1998    Years since quitting: 19.7  . Smokeless tobacco: Never Used  Substance Use Topics  . Alcohol use: No    Subjective:  Patient presents with concerns for cough/ congestion; + low grade fever; denies any chest pain or shortness of breath; using OTC Tylenol and cough syrup; worried about symptoms worsening over the upcoming weekend;   Objective:  Vitals:   11/19/17 1538  BP: 130/72  Pulse: 64  Temp: 99 F (37.2 C)  TempSrc: Oral  SpO2: 96%  Weight: 111 lb 1.3 oz (50.4 kg)  Height: 5' (1.524 m)    General: Well developed, well nourished, in no acute distress  Skin : Warm and dry.  Head: Normocephalic and atraumatic  Eyes:  Sclera and conjunctiva clear; pupils round and reactive to light; extraocular movements intact  Ears: External normal; canals clear; tympanic membranes normal  Oropharynx: Pink, supple. No suspicious lesions  Neck: Supple without thyromegaly, adenopathy  Lungs: Respirations unlabored; clear to auscultation bilaterally without wheeze, rales, rhonchi  CVS exam: normal rate and regular rhythm.   Neurologic: Alert and oriented; speech intact; face symmetrical; moves all extremities well; CNII-XII intact without focal deficit   Assessment:  1. Acute bronchitis, unspecified organism     Plan:  Rx for Z-pak #1 take as directed; continue OTC cough syrup; increase fluids, rest and follow-up worse, no better.   No Follow-up on file.  No orders of the defined types were placed in this encounter.   Requested Prescriptions   Signed Prescriptions Disp Refills  . azithromycin (ZITHROMAX) 250 MG tablet 6 tablet 0    Sig: 2 tabs po qd x 1 day; 1 tablet per day x 4 days;

## 2017-11-22 ENCOUNTER — Other Ambulatory Visit: Payer: Self-pay | Admitting: Internal Medicine

## 2017-11-30 ENCOUNTER — Telehealth: Payer: Self-pay | Admitting: Internal Medicine

## 2017-11-30 NOTE — Telephone Encounter (Signed)
Copied from CRM [id number]. Topic: Quick Communication - See Telephone Encounter >> Nov 30, 2017  8:50 AM Oneal GroutSebastian, Jennifer S wrote: CRM for notification. See Telephone encounter for:  Patient would like to know if she needs to continue to take aspirin 81mg  11/30/17.

## 2017-11-30 NOTE — Telephone Encounter (Signed)
Please advise regarding ASA.  Thanks

## 2017-12-01 NOTE — Telephone Encounter (Signed)
CRM created incase she calls back.

## 2017-12-01 NOTE — Telephone Encounter (Signed)
Do not stop aspirin.  Thank you

## 2017-12-01 NOTE — Telephone Encounter (Signed)
Called and left message for patient today to continue taking her ASA.

## 2017-12-27 ENCOUNTER — Ambulatory Visit: Payer: Medicare Other | Admitting: Internal Medicine

## 2017-12-28 ENCOUNTER — Ambulatory Visit: Payer: Medicare Other | Admitting: Internal Medicine

## 2018-01-17 ENCOUNTER — Other Ambulatory Visit: Payer: Self-pay | Admitting: Internal Medicine

## 2018-02-08 ENCOUNTER — Ambulatory Visit: Payer: Self-pay | Admitting: *Deleted

## 2018-02-08 NOTE — Telephone Encounter (Signed)
Pt has had coughing going on for 6-7 weeks and thinks it may be from her bp medicine. It got worse the last 2-3 weeks. cb is [phone]. Pt would like advise if this can be from her bp medicine.      Patient is calling to report a cough- she states it started dry- but she states she may have some congestion that is not moving yet. She has never had problems with allergies- although she has started a runny nose. Advised increase fluids patient can try cough remedies and Mucinex or expectorant. Offered appointment for check. Patient is going to wait and try home management- she will call to scheduled an appointment if she does not get better.  Reason for Disposition . Cough has been present for > 3 weeks  Answer Assessment - Initial Assessment Questions 1. ONSET: "When did the cough begin?"      Few weeks 2. SEVERITY: "How bad is the cough today?"      Not bad today- can be bad at times- Friday and Saturday patient had bad cough that caused throat to be raw and patient could not sleep 3. RESPIRATORY DISTRESS: "Describe your breathing."      Not effecting breathing 4. FEVER: "Do you have a fever?" If so, ask: "What is your temperature, how was it measured, and when did it start?"     No- patient has not checked 5. HEMOPTYSIS: "Are you coughing up any blood?" If so ask: "How much?" (flecks, streaks, tablespoons, etc.)     no 6. TREATMENT: "What have you done so far to treat the cough?" (e.g., meds, fluids, humidifier)     Friday night patient used Delsym- did help some 7. CARDIAC HISTORY: "Do you have any history of heart disease?" (e.g., heart attack, congestive heart failure)      No 8. LUNG HISTORY: "Do you have any history of lung disease?"  (e.g., pulmonary embolus, asthma, emphysema)     no 9. PE RISK FACTORS: "Do you have a history of blood clots?" (or: recent major surgery, recent prolonged travel, bedridden )     no 10. OTHER SYMPTOMS: "Do you have any other symptoms? (e.g.,  runny nose, wheezing, chest pain)       Nose runny- raw throat 11. PREGNANCY: "Is there any chance you are pregnant?" "When was your last menstrual period?"       n/a 12. TRAVEL: "Have you traveled out of the country in the last month?" (e.g., travel history, exposures)       no  Protocols used: COUGH - ACUTE NON-PRODUCTIVE-A-AH

## 2018-02-08 NOTE — Telephone Encounter (Signed)
noted 

## 2018-02-11 ENCOUNTER — Ambulatory Visit (INDEPENDENT_AMBULATORY_CARE_PROVIDER_SITE_OTHER): Payer: Medicare Other | Admitting: Family Medicine

## 2018-02-11 ENCOUNTER — Telehealth: Payer: Self-pay | Admitting: Family Medicine

## 2018-02-11 ENCOUNTER — Ambulatory Visit: Payer: Self-pay | Admitting: *Deleted

## 2018-02-11 ENCOUNTER — Other Ambulatory Visit (INDEPENDENT_AMBULATORY_CARE_PROVIDER_SITE_OTHER): Payer: Medicare Other

## 2018-02-11 ENCOUNTER — Encounter: Payer: Self-pay | Admitting: Family Medicine

## 2018-02-11 VITALS — BP 122/68 | HR 68 | Ht 60.0 in | Wt 113.0 lb

## 2018-02-11 DIAGNOSIS — R5383 Other fatigue: Secondary | ICD-10-CM | POA: Diagnosis not present

## 2018-02-11 DIAGNOSIS — R059 Cough, unspecified: Secondary | ICD-10-CM

## 2018-02-11 DIAGNOSIS — R05 Cough: Secondary | ICD-10-CM

## 2018-02-11 LAB — CBC WITH DIFFERENTIAL/PLATELET
Basophils Absolute: 0 10*3/uL (ref 0.0–0.1)
Basophils Relative: 0.1 % (ref 0.0–3.0)
Eosinophils Absolute: 0 10*3/uL (ref 0.0–0.7)
Eosinophils Relative: 0.2 % (ref 0.0–5.0)
HCT: 33.1 % — ABNORMAL LOW (ref 36.0–46.0)
Hemoglobin: 11.3 g/dL — ABNORMAL LOW (ref 12.0–15.0)
Lymphocytes Relative: 11.7 % — ABNORMAL LOW (ref 12.0–46.0)
Lymphs Abs: 1.7 10*3/uL (ref 0.7–4.0)
MCHC: 34 g/dL (ref 30.0–36.0)
MCV: 82.3 fl (ref 78.0–100.0)
Monocytes Absolute: 1.4 10*3/uL — ABNORMAL HIGH (ref 0.1–1.0)
Monocytes Relative: 9.7 % (ref 3.0–12.0)
Neutro Abs: 11.1 10*3/uL — ABNORMAL HIGH (ref 1.4–7.7)
Neutrophils Relative %: 78.3 % — ABNORMAL HIGH (ref 43.0–77.0)
Platelets: 288 10*3/uL (ref 150.0–400.0)
RBC: 4.03 Mil/uL (ref 3.87–5.11)
RDW: 14.6 % (ref 11.5–15.5)
WBC: 14.2 10*3/uL — ABNORMAL HIGH (ref 4.0–10.5)

## 2018-02-11 LAB — COMPREHENSIVE METABOLIC PANEL
ALT: 9 U/L (ref 0–35)
AST: 18 U/L (ref 0–37)
Albumin: 4 g/dL (ref 3.5–5.2)
Alkaline Phosphatase: 57 U/L (ref 39–117)
BUN: 26 mg/dL — ABNORMAL HIGH (ref 6–23)
CO2: 28 mEq/L (ref 19–32)
Calcium: 9 mg/dL (ref 8.4–10.5)
Chloride: 82 mEq/L — ABNORMAL LOW (ref 96–112)
Creatinine, Ser: 1.58 mg/dL — ABNORMAL HIGH (ref 0.40–1.20)
GFR: 33.03 mL/min — ABNORMAL LOW (ref >60.00)
Glucose, Bld: 129 mg/dL — ABNORMAL HIGH (ref 70–99)
Potassium: 3.3 mEq/L — ABNORMAL LOW (ref 3.5–5.1)
Sodium: 120 mEq/L — CL (ref 135–145)
Total Bilirubin: 0.7 mg/dL (ref 0.2–1.2)
Total Protein: 7.4 g/dL (ref 6.0–8.3)

## 2018-02-11 LAB — TSH: TSH: 1.7 u[IU]/mL (ref 0.35–4.50)

## 2018-02-11 MED ORDER — PREDNISONE 5 MG PO TABS: ORAL_TABLET | ORAL | 0 refills | Status: DC

## 2018-02-11 NOTE — Telephone Encounter (Signed)
Patient reports has had a cough recently and now has weakness and fatigue. Appointment made for today with Dr Clare Gandy per protocall.  Reason for Disposition . [1] MODERATE weakness (i.e., interferes with work, school, normal activities) AND [2] persists > 3 days  Answer Assessment - Initial Assessment Questions 1. DESCRIPTION: "Describe how you are feeling."        Weak tiredness in legs   2. SEVERITY: "How bad is it?"  "Can you stand and walk?"   - MILD - Feels weak or tired, but does not interfere with work, school or normal activities   - MODERATE - Able to stand and walk; weakness interferes with work, school, or normal activities   - SEVERE - Unable to stand or walk       Unsteady  When she walks  3. ONSET:  "When did the weakness begin?"        Off and on for sev months last few days   4. CAUSE: "What do you think is causing the weakness?"          Uncertain   5. MEDICINES: "Have you recently started a new medicine or had a change in the amount of a medicine?"            No 6. OTHER SYMPTOMS: "Do you have any other symptoms?" (e.g., chest pain, fever, cough, SOB, vomiting, diarrhea, bleeding)            Recently had cough but that is better  7. PREGNANCY: "Is there any chance you are pregnant?" "When was your last menstrual period?"       N/a  Protocols used: WEAKNESS (GENERALIZED) AND FATIGUE-A-AH

## 2018-02-11 NOTE — Patient Instructions (Signed)
Try the prednisone  We will call you with the results from today  Please follow up with your primary doctor if your symptoms are ongoing

## 2018-02-11 NOTE — Telephone Encounter (Signed)
Informed patient of her lab results.  Lab work is suggestive of significant hyponatremia and acute kidney injury.  Advised that she go to the emergency room for the consideration of fluids and further testing.  [name], MD [facility] 02/11/2018, 5:59 PM

## 2018-02-11 NOTE — Progress Notes (Signed)
Vickie Wilson - 84 y.o. female MRN [id number]  Date of birth: 1930-09-18  SUBJECTIVE:  Including CC & ROS.  Chief Complaint  Patient presents with  . Fatigue    Vickie Wilson is a 84 y.o. female that is presenting with weakness. Ongoing for six months, increasing over the past month. She feels unstable when she first stands up to walk. Admits to intermittent dizziness. She feels worse throughout the day. Denies recent medication changes. Denies surgeries.   Has had an ongoing cough over the past few weeks.  Denies any fevers.  She feels like her cough has had some improvement.  Denies any history of asthma.  Denies any production with the cough.  Cough seems to be worse at night.   Review of Systems  Constitutional: Negative for fever.  HENT: Negative for congestion.   Respiratory: Positive for cough.   Cardiovascular: Negative for chest pain.  Gastrointestinal: Negative for abdominal pain.  Musculoskeletal: Positive for gait problem.  Skin: Negative for color change.  Neurological: Positive for dizziness.  Hematological: Negative for adenopathy.  Psychiatric/Behavioral: Negative for agitation.    HISTORY: Past Medical, Surgical, Social, and Family History Reviewed & Updated per EMR.   Pertinent Historical Findings include:  Past Medical History:  Diagnosis Date  . Allergy   . Anxiety   . Atrial mass    right  . Constipation   . Depression   . Diabetes mellitus   . Hyperlipidemia   . Hypertension   . Osteoporosis     Past Surgical History:  Procedure Laterality Date  . DILATION AND CURETTAGE OF UTERUS  1987    Allergies  Allergen Reactions  . Captopril     REACTION: cough    Family History  Problem Relation Age of Onset  . Heart disease Mother   . Alcohol abuse Father   . Heart disease Father   . Hyperlipidemia Other   . Hypertension Other   . Stroke Other      Social History   Socioeconomic History  . Marital status: Single    Spouse name: Not  on file  . Number of children: 1  . Years of education: Not on file  . Highest education level: Not on file  Occupational History  . Occupation: retired  Engineer, production  . Financial resource strain: Not on file  . Food insecurity:    Worry: Not on file    Inability: Not on file  . Transportation needs:    Medical: Not on file    Non-medical: Not on file  Tobacco Use  . Smoking status: Former Smoker    Packs/day: 1.00    Years: 20.00    Pack years: 20.00    Last attempt to quit: 03/13/1998    Years since quitting: 19.9  . Smokeless tobacco: Never Used  Substance and Sexual Activity  . Alcohol use: No  . Drug use: No  . Sexual activity: Not Currently  Lifestyle  . Physical activity:    Days per week: Not on file    Minutes per session: Not on file  . Stress: Not on file  Relationships  . Social connections:    Talks on phone: Not on file    Gets together: Not on file    Attends religious service: Not on file    Active member of club or organization: Not on file    Attends meetings of clubs or organizations: Not on file    Relationship status: Not on file  .  Intimate partner violence:    Fear of current or ex partner: Not on file    Emotionally abused: Not on file    Physically abused: Not on file    Forced sexual activity: Not on file  Other Topics Concern  . Not on file  Social History Narrative   Regular exercise-yes      Daily caffeine use     PHYSICAL EXAM:  VS: BP 122/68 (BP Location: Left Arm, Patient Position: Sitting, Cuff Size: Normal)   Pulse 68   Ht 5' (1.524 m)   Wt 113 lb (51.3 kg)   SpO2 95%   BMI 22.07 kg/m  Physical Exam Gen: NAD, alert, cooperative with exam,  ENT: normal lips, normal nasal mucosa,  Eye: normal EOM, normal conjunctiva and lids CV:  no edema, +2 pedal pulses, S1S2, RRR   Resp: no accessory muscle use, non-labored,  No crackles, expiratory wheezing  Skin: no rashes, no areas of induration  Neuro: normal tone, normal  sensation to touch Psych:  normal insight, alert and oriented MSK: normal strength       ASSESSMENT & PLAN:   Cough Has wheezing on exam. Possible to be exacerbated with viral illness.  - prednisone  - if ongoing consider xray   Other fatigue Unclear to source. Seems to be intermittent but has gotten worse lately.  - CMP, CBC, TSH

## 2018-02-12 ENCOUNTER — Other Ambulatory Visit: Payer: Self-pay

## 2018-02-12 ENCOUNTER — Emergency Department (HOSPITAL_COMMUNITY): Payer: Medicare Other

## 2018-02-12 ENCOUNTER — Observation Stay (HOSPITAL_COMMUNITY)
Admission: EM | Admit: 2018-02-12 | Discharge: 2018-02-13 | Disposition: A | Discharge status: Home / Self Care | Payer: Medicare Other | Attending: Internal Medicine | Admitting: Internal Medicine

## 2018-02-12 ENCOUNTER — Encounter (HOSPITAL_COMMUNITY): Payer: Self-pay

## 2018-02-12 DIAGNOSIS — F329 Major depressive disorder, single episode, unspecified: Secondary | ICD-10-CM | POA: Diagnosis not present

## 2018-02-12 DIAGNOSIS — Z823 Family history of stroke: Secondary | ICD-10-CM | POA: Diagnosis not present

## 2018-02-12 DIAGNOSIS — Z811 Family history of alcohol abuse and dependence: Secondary | ICD-10-CM | POA: Diagnosis not present

## 2018-02-12 DIAGNOSIS — Z8249 Family history of ischemic heart disease and other diseases of the circulatory system: Secondary | ICD-10-CM | POA: Diagnosis not present

## 2018-02-12 DIAGNOSIS — E876 Hypokalemia: Secondary | ICD-10-CM | POA: Insufficient documentation

## 2018-02-12 DIAGNOSIS — E871 Hypo-osmolality and hyponatremia: Secondary | ICD-10-CM | POA: Diagnosis not present

## 2018-02-12 DIAGNOSIS — I129 Hypertensive chronic kidney disease with stage 1 through stage 4 chronic kidney disease, or unspecified chronic kidney disease: Secondary | ICD-10-CM | POA: Diagnosis not present

## 2018-02-12 DIAGNOSIS — E785 Hyperlipidemia, unspecified: Secondary | ICD-10-CM | POA: Diagnosis not present

## 2018-02-12 DIAGNOSIS — E1122 Type 2 diabetes mellitus with diabetic chronic kidney disease: Secondary | ICD-10-CM | POA: Diagnosis not present

## 2018-02-12 DIAGNOSIS — I493 Ventricular premature depolarization: Secondary | ICD-10-CM | POA: Diagnosis not present

## 2018-02-12 DIAGNOSIS — M810 Age-related osteoporosis without current pathological fracture: Secondary | ICD-10-CM | POA: Diagnosis not present

## 2018-02-12 DIAGNOSIS — Z888 Allergy status to other drugs, medicaments and biological substances status: Secondary | ICD-10-CM | POA: Insufficient documentation

## 2018-02-12 DIAGNOSIS — I10 Essential (primary) hypertension: Secondary | ICD-10-CM | POA: Diagnosis present

## 2018-02-12 DIAGNOSIS — N8110 Cystocele, unspecified: Secondary | ICD-10-CM | POA: Insufficient documentation

## 2018-02-12 DIAGNOSIS — K5900 Constipation, unspecified: Secondary | ICD-10-CM | POA: Insufficient documentation

## 2018-02-12 DIAGNOSIS — N179 Acute kidney failure, unspecified: Secondary | ICD-10-CM | POA: Diagnosis not present

## 2018-02-12 DIAGNOSIS — N390 Urinary tract infection, site not specified: Secondary | ICD-10-CM | POA: Diagnosis not present

## 2018-02-12 DIAGNOSIS — N182 Chronic kidney disease, stage 2 (mild): Secondary | ICD-10-CM | POA: Insufficient documentation

## 2018-02-12 DIAGNOSIS — F419 Anxiety disorder, unspecified: Secondary | ICD-10-CM | POA: Diagnosis present

## 2018-02-12 DIAGNOSIS — R059 Cough, unspecified: Secondary | ICD-10-CM

## 2018-02-12 DIAGNOSIS — R05 Cough: Secondary | ICD-10-CM

## 2018-02-12 DIAGNOSIS — Z87891 Personal history of nicotine dependence: Secondary | ICD-10-CM | POA: Insufficient documentation

## 2018-02-12 DIAGNOSIS — E119 Type 2 diabetes mellitus without complications: Secondary | ICD-10-CM | POA: Diagnosis not present

## 2018-02-12 DIAGNOSIS — Z79899 Other long term (current) drug therapy: Secondary | ICD-10-CM | POA: Insufficient documentation

## 2018-02-12 DIAGNOSIS — Z7982 Long term (current) use of aspirin: Secondary | ICD-10-CM | POA: Insufficient documentation

## 2018-02-12 DIAGNOSIS — F32A Depression, unspecified: Secondary | ICD-10-CM | POA: Diagnosis present

## 2018-02-12 DIAGNOSIS — F411 Generalized anxiety disorder: Secondary | ICD-10-CM | POA: Diagnosis not present

## 2018-02-12 LAB — CBC WITH DIFFERENTIAL/PLATELET
Basophils Absolute: 0 10*3/uL (ref 0.0–0.1)
Basophils Relative: 0 %
Eosinophils Absolute: 0.1 10*3/uL (ref 0.0–0.7)
Eosinophils Relative: 1 %
HCT: 33.6 % — ABNORMAL LOW (ref 36.0–46.0)
Hemoglobin: 11.8 g/dL — ABNORMAL LOW (ref 12.0–15.0)
Lymphocytes Relative: 12 %
Lymphs Abs: 1.5 10*3/uL (ref 0.7–4.0)
MCH: 28.2 pg (ref 26.0–34.0)
MCHC: 35.1 g/dL (ref 30.0–36.0)
MCV: 80.4 fL (ref 78.0–100.0)
Monocytes Absolute: 1.1 10*3/uL — ABNORMAL HIGH (ref 0.1–1.0)
Monocytes Relative: 9 %
Neutro Abs: 10.2 10*3/uL — ABNORMAL HIGH (ref 1.7–7.7)
Neutrophils Relative %: 78 %
Platelets: 326 10*3/uL (ref 150–400)
RBC: 4.18 MIL/uL (ref 3.87–5.11)
RDW: 14.1 % (ref 11.5–15.5)
WBC: 12.9 10*3/uL — ABNORMAL HIGH (ref 4.0–10.5)

## 2018-02-12 LAB — BASIC METABOLIC PANEL
Anion gap: 15 (ref 5–15)
Anion gap: 9 (ref 5–15)
BUN: 32 mg/dL — ABNORMAL HIGH (ref 6–20)
BUN: 33 mg/dL — ABNORMAL HIGH (ref 6–20)
CO2: 25 mmol/L (ref 22–32)
CO2: 25 mmol/L (ref 22–32)
Calcium: 9 mg/dL (ref 8.9–10.3)
Calcium: 8.1 mg/dL — ABNORMAL LOW (ref 8.9–10.3)
Chloride: 81 mmol/L — ABNORMAL LOW (ref 101–111)
Chloride: 95 mmol/L — ABNORMAL LOW (ref 101–111)
Creatinine, Ser: 2.04 mg/dL — ABNORMAL HIGH (ref 0.44–1.00)
Creatinine, Ser: 1.75 mg/dL — ABNORMAL HIGH (ref 0.44–1.00)
GFR calc Af Amer: 24 mL/min — ABNORMAL LOW (ref >60)
GFR calc Af Amer: 29 mL/min — ABNORMAL LOW (ref >60)
GFR calc non Af Amer: 21 mL/min — ABNORMAL LOW (ref >60)
GFR calc non Af Amer: 25 mL/min — ABNORMAL LOW (ref >60)
Glucose, Bld: 125 mg/dL — ABNORMAL HIGH (ref 65–99)
Glucose, Bld: 138 mg/dL — ABNORMAL HIGH (ref 65–99)
Potassium: 2.8 mmol/L — ABNORMAL LOW (ref 3.5–5.1)
Potassium: 3.3 mmol/L — ABNORMAL LOW (ref 3.5–5.1)
Sodium: 121 mmol/L — ABNORMAL LOW (ref 135–145)
Sodium: 129 mmol/L — ABNORMAL LOW (ref 135–145)

## 2018-02-12 LAB — URINALYSIS, ROUTINE W REFLEX MICROSCOPIC
Bilirubin Urine: NEGATIVE
Glucose, UA: NEGATIVE mg/dL
Ketones, ur: NEGATIVE mg/dL
Nitrite: NEGATIVE
Protein, ur: NEGATIVE mg/dL
Specific Gravity, Urine: <1.005 — ABNORMAL LOW (ref 1.005–1.030)
pH: 7 (ref 5.0–8.0)

## 2018-02-12 LAB — GLUCOSE, CAPILLARY
Glucose-Capillary: 156 mg/dL — ABNORMAL HIGH (ref 65–99)
Glucose-Capillary: 110 mg/dL — ABNORMAL HIGH (ref 65–99)

## 2018-02-12 LAB — URINALYSIS, MICROSCOPIC (REFLEX)

## 2018-02-12 LAB — MAGNESIUM: Magnesium: 2.2 mg/dL (ref 1.7–2.4)

## 2018-02-12 MED ORDER — SODIUM CHLORIDE 0.9 % IV SOLN
INTRAVENOUS | Status: DC
  Administered 2018-02-12: 10:00:00 via INTRAVENOUS

## 2018-02-12 MED ORDER — GUAIFENESIN-DM 100-10 MG/5ML PO SYRP
5.0000 mL | ORAL_SOLUTION | ORAL | Status: DC | PRN
  Administered 2018-02-12 – 2018-02-13 (×2): 5 mL via ORAL
  Filled 2018-02-12 (×2): qty 10

## 2018-02-12 MED ORDER — POTASSIUM CHLORIDE CRYS ER 20 MEQ PO TBCR
40.0000 meq | EXTENDED_RELEASE_TABLET | Freq: Once | ORAL | Status: AC
  Administered 2018-02-12: 40 meq via ORAL
  Filled 2018-02-12: qty 2

## 2018-02-12 MED ORDER — ENOXAPARIN SODIUM 30 MG/0.3ML ~~LOC~~ SOLN
30.0000 mg | SUBCUTANEOUS | Status: DC
  Administered 2018-02-12: 30 mg via SUBCUTANEOUS
  Filled 2018-02-12: qty 0.3

## 2018-02-12 MED ORDER — POTASSIUM CHLORIDE 10 MEQ/100ML IV SOLN
10.0000 meq | INTRAVENOUS | Status: AC
  Administered 2018-02-12 (×3): 10 meq via INTRAVENOUS
  Filled 2018-02-12 (×3): qty 100

## 2018-02-12 MED ORDER — PHENOL 1.4 % MT LIQD
1.0000 | OROMUCOSAL | Status: DC | PRN
  Filled 2018-02-12: qty 177

## 2018-02-12 MED ORDER — ROSUVASTATIN CALCIUM 10 MG PO TABS
10.0000 mg | ORAL_TABLET | Freq: Every day | ORAL | Status: DC
  Administered 2018-02-12: 10 mg via ORAL
  Filled 2018-02-12: qty 1

## 2018-02-12 MED ORDER — INSULIN ASPART 100 UNIT/ML ~~LOC~~ SOLN: 0.0000 [IU] | Freq: Three times a day (TID) | SUBCUTANEOUS | Status: DC

## 2018-02-12 MED ORDER — GUAIFENESIN ER 600 MG PO TB12
600.0000 mg | ORAL_TABLET | Freq: Two times a day (BID) | ORAL | Status: DC
  Administered 2018-02-12 – 2018-02-13 (×3): 600 mg via ORAL
  Filled 2018-02-12 (×3): qty 1

## 2018-02-12 MED ORDER — HYDRALAZINE HCL 20 MG/ML IJ SOLN: 5.0000 mg | Freq: Four times a day (QID) | INTRAMUSCULAR | Status: DC | PRN

## 2018-02-12 MED ORDER — ATENOLOL 50 MG PO TABS
50.0000 mg | ORAL_TABLET | Freq: Every day | ORAL | Status: DC
  Administered 2018-02-13: 50 mg via ORAL
  Filled 2018-02-12: qty 1

## 2018-02-12 MED ORDER — ASPIRIN EC 81 MG PO TBEC
81.0000 mg | DELAYED_RELEASE_TABLET | Freq: Every day | ORAL | Status: DC
  Administered 2018-02-13: 81 mg via ORAL
  Filled 2018-02-12: qty 1

## 2018-02-12 MED ORDER — SODIUM CHLORIDE 0.9 % IV SOLN
INTRAVENOUS | Status: DC
  Administered 2018-02-12 – 2018-02-13 (×3): via INTRAVENOUS

## 2018-02-12 MED ORDER — AMLODIPINE BESYLATE 5 MG PO TABS
5.0000 mg | ORAL_TABLET | Freq: Every day | ORAL | Status: DC
  Administered 2018-02-12 – 2018-02-13 (×2): 5 mg via ORAL
  Filled 2018-02-12 (×2): qty 1

## 2018-02-12 MED ORDER — SODIUM CHLORIDE 0.9 % IV BOLUS
500.0000 mL | Freq: Once | INTRAVENOUS | Status: AC
  Administered 2018-02-12: 500 mL via INTRAVENOUS

## 2018-02-12 MED ORDER — MENTHOL 3 MG MT LOZG
1.0000 | LOZENGE | OROMUCOSAL | Status: DC | PRN
  Administered 2018-02-12: 3 mg via ORAL
  Filled 2018-02-12 (×2): qty 9

## 2018-02-12 MED ORDER — PAROXETINE HCL 10 MG PO TABS
5.0000 mg | ORAL_TABLET | Freq: Every day | ORAL | Status: DC
  Administered 2018-02-13: 5 mg via ORAL
  Filled 2018-02-12: qty 0.5

## 2018-02-12 NOTE — ED Notes (Signed)
Pt ambulatory to restroom

## 2018-02-12 NOTE — ED Notes (Signed)
Pt ambultated to restroom to attempt UA. Unsuccessful

## 2018-02-12 NOTE — ED Triage Notes (Signed)
She reports recent cough. She was seen yesterday at her [place] pcp office. They [facility] called her today to inform her she has abnormal lab results and to go to hospital. She is here with her daughter and is in no distress.

## 2018-02-12 NOTE — ED Notes (Signed)
Pt successfully kept down glass of water.

## 2018-02-12 NOTE — H&P (Addendum)
History and Physical    Vickie Wilson ZOX:[id number] DOB: 1931/08/22 DOA: 02/12/2018  PCP: Tresa Garter, MD  Patient coming from: Home  Chief Complaint: Abnormal lab  HPI: Vickie Wilson is a 84 y.o. female with medical history significant of anxiety, depression, CKD stage II.  Patient presented secondary to an abnormal lab of a sodium of 120.  Yesterday, she went to her primary care physician's office for evaluation of a cough.  She was prescribed prednisone and labs were obtained.  Yesterday evening, her PCPs office called her and advised her to seek evaluation in the emergency department for hyponatremia and AKI.  She has had coughing without any productive sputum.  No wheezing or shortness of breath.  ED Course: Vitals: Afebrile, normal pulse, normal respirations, normotensive, on room air Labs: Sodium of 121, potassium of 2.8, creatinine of 2.04, WBC of 12.9k Imaging: Chest x-ray without active disease Medications/Course: Potassium, NS  Review of Systems: Review of Systems  Constitutional: Negative for chills and fever.  Respiratory: Positive for cough. Negative for sputum production, shortness of breath and wheezing.   Neurological: Positive for weakness. Negative for dizziness and focal weakness.  All other systems reviewed and are negative.   Past Medical History:  Diagnosis Date  . Allergy   . Anxiety   . Atrial mass    right  . Constipation   . Depression   . Diabetes mellitus   . Hyperlipidemia   . Hypertension   . Osteoporosis     Past Surgical History:  Procedure Laterality Date  . DILATION AND CURETTAGE OF UTERUS  1987     reports that she quit smoking about 19 years ago. She has a 20.00 pack-year smoking history. She has never used smokeless tobacco. She reports that she does not drink alcohol or use drugs.  Allergies  Allergen Reactions  . Captopril     REACTION: cough    Family History  Problem Relation Age of Onset  . Heart disease  Mother   . Alcohol abuse Father   . Heart disease Father   . Hyperlipidemia Other   . Hypertension Other   . Stroke Other     Prior to Admission medications   Medication Sig Start Date End Date Taking? Authorizing Provider  amLODipine (NORVASC) 5 MG tablet TAKE 1 TABLET ONCE DAILY. 01/18/18  Yes Plotnikov, Georgina Quint, MD  aspirin EC 81 MG tablet Take 81 mg by mouth daily.   Yes [provider]  atenolol (TENORMIN) 100 MG tablet TAKE (1/2) TABLET TWICE DAILY. 04/27/17  Yes Plotnikov, Georgina Quint, MD  losartan (COZAAR) 100 MG tablet TAKE 1 TABLET EACH DAY. 07/26/17  Yes Plotnikov, Georgina Quint, MD  Multiple Vitamins-Minerals (CENTRUM SILVER) CHEW Chew 1 each by mouth 3 (three) times a week.    Yes [provider]  PARoxetine (PAXIL) 10 MG tablet TAKE 1 TABLET IN THE MORNING. 11/22/17  Yes Plotnikov, Georgina Quint, MD  rosuvastatin (CRESTOR) 20 MG tablet Take 0.5 tablets (10 mg total) by mouth daily. 10/06/17  Yes Plotnikov, Georgina Quint, MD  triamterene-hydrochlorothiazide (MAXZIDE) 75-50 MG tablet TAKE (1/2) TABLET DAILY IN THE MORNING. 09/24/17  Yes Plotnikov, Georgina Quint, MD  predniSONE (DELTASONE) 5 MG tablet Take 6 pills for first day, 5 pills second day, 4 pills third day, 3 pills fourth day, 2 pills the fifth day, and 1 pill sixth day. 02/11/18   [name], MD    Physical Exam: Vitals:   02/12/18 1401 02/12/18 1432 02/12/18  1433 02/12/18 1434  BP:  101/68  101/68  Pulse: (!) 59  66 67  Resp: 15 17 20  (!) 21  Temp:      TempSrc:      SpO2: 96%  94% 97%   Physical Exam  Constitutional: She is oriented to person, place, and time. She appears well-developed and well-nourished. No distress.  HENT:  Mouth/Throat: Mucous membranes are dry. No oropharyngeal exudate.  Eyes: Pupils are equal, round, and reactive to light. Conjunctivae and EOM are normal.  Neck: Normal range of motion.  Cardiovascular: Normal rate, regular rhythm and normal heart sounds.  No murmur  heard. Pulmonary/Chest: Effort normal. No respiratory distress. She has no wheezes. She has rhonchi. She has no rales.  Abdominal: Soft. Bowel sounds are normal. She exhibits no distension. There is no tenderness. There is no rebound and no guarding.  Musculoskeletal: Normal range of motion. She exhibits no edema or tenderness.  Lymphadenopathy:    She has no cervical adenopathy.  Neurological: She is alert and oriented to person, place, and time.  Skin: Skin is warm and dry. She is not diaphoretic.  Psychiatric: She has a normal mood and affect. Thought content normal.  Vitals reviewed.   Labs on Admission: I have personally reviewed following labs and imaging studies  CBC: Recent Labs  Lab 02/11/18 1355 02/12/18 0952  WBC 14.2* 12.9*  NEUTROABS 11.1* 10.2*  HGB 11.3* 11.8*  HCT 33.1* 33.6*  MCV 82.3 80.4  PLT 288.0 326   Basic Metabolic Panel: Recent Labs  Lab 02/11/18 1355 02/12/18 0952  NA 120* 121*  K 3.3* 2.8*  CL 82* 81*  CO2 28 25  GLUCOSE 129* 125*  BUN 26* 32*  CREATININE 1.58* 2.04*  CALCIUM 9.0 9.0   GFR: Estimated Creatinine Clearance: 14.5 mL/min (A) (by C-G formula based on SCr of 2.04 mg/dL (H)). Liver Function Tests: Recent Labs  Lab 02/11/18 1355  AST 18  ALT 9  ALKPHOS 57  BILITOT 0.7  PROT 7.4  ALBUMIN 4.0   No results for input(s): LIPASE, AMYLASE in the last 168 hours. No results for input(s): AMMONIA in the last 168 hours. Coagulation Profile: No results for input(s): INR, PROTIME in the last 168 hours. Cardiac Enzymes: No results for input(s): CKTOTAL, CKMB, CKMBINDEX, TROPONINI in the last 168 hours. BNP (last 3 results) No results for input(s): PROBNP in the last 8760 hours. HbA1C: No results for input(s): HGBA1C in the last 72 hours. CBG: No results for input(s): GLUCAP in the last 168 hours. Lipid Profile: No results for input(s): CHOL, HDL, LDLCALC, TRIG, CHOLHDL, LDLDIRECT in the last 72 hours. Thyroid Function  Tests: Recent Labs    02/11/18 1355  TSH 1.70   Anemia Panel: No results for input(s): VITAMINB12, FOLATE, FERRITIN, TIBC, IRON, RETICCTPCT in the last 72 hours. Urine analysis:    Component Value Date/Time   COLORURINE YELLOW 02/12/2018 0952   APPEARANCEUR HAZY (A) 02/12/2018 0952   LABSPEC <1.005 (L) 02/12/2018 0952   PHURINE 7.0 02/12/2018 0952   GLUCOSEU NEGATIVE 02/12/2018 0952   GLUCOSEU NEGATIVE 06/02/2017 0748   HGBUR SMALL (A) 02/12/2018 0952   BILIRUBINUR NEGATIVE 02/12/2018 0952   BILIRUBINUR neg 04/22/2016 1625   KETONESUR NEGATIVE 02/12/2018 0952   PROTEINUR NEGATIVE 02/12/2018 0952   UROBILINOGEN 0.2 06/02/2017 0748   NITRITE NEGATIVE 02/12/2018 0952   LEUKOCYTESUR LARGE (A) 02/12/2018 0952   Sepsis Labs: !!!!!!!!!!!!!!!!!!!!!!!!!!!!!!!!!!!!!!!!!!!! @LABRCNTIP (procalcitonin:4,lacticidven:4) )No results found for this or any previous visit (from the past 240 hour(s)).  Radiological Exams on Admission: Dg Chest 2 View  Result Date: 02/12/2018 CLINICAL DATA:  Cough. EXAM: CHEST - 2 VIEW COMPARISON:  None. FINDINGS: The heart, hila, and mediastinum are normal. Opacity in the medial right base is thought to be due to vascular crowding from the right hilum. No suspicious infiltrate. No pneumothorax. No nodule or mass. IMPRESSION: No active cardiopulmonary disease. Electronically Signed   By: Gerome Samavid  Williams III M.D   On: 02/12/2018 10:44    EKG: Independently reviewed. Sinus rhythm with infrequent PVCs  Assessment/Plan Principal Problem:   Acute kidney injury (HCC) Active Problems:   Diabetic on diet only (HCC)   Dyslipidemia   Anxiety state   Depression   HTN (hypertension)   Hyponatremia   Acute kidney injury on CKD II In setting of dehydration. Dry mucous membranes and decreased oral intake. Baseline creatinine of 1.3. 2.04 on admission -IV fluids  Hyponatremia Patient is dehydrated. She is also on hydrochlorothiazide and Paxil. -Discontinue  hydrochlorothiazide -IV fluids -Will continue Paxil as this will need to be tapered  Cough Persistent for the past three weeks. Rhonchi on exam. Considered steroids, but will wait until sodium improved. Patient declines albuterol secondary to risk of tachycardia (patient has a history of tachycardia of unknown type). -Mucinex  Hypertension Controlled. -Hold losartan and triamterene in setting of AKI -Hold hydrochlorothiazide in setting of hyponatremia -Continue atenolol -Hydralazine prn  Depression/anxiety Patient takes Paxil as an outpatient. She takes 5 mg daily -Paxil 5 mg daily  Diabetes mellitus, type 2 Diet controlled  Hyperlipidemia -Continue statin   DVT prophylaxis: Lovenox Code Status: DNR Family Communication: Daughter at bedside Disposition Plan: Discharge home likely in 24 hours Consults called: None Admission status: Observation, telemetry   Jacquelin Hawkingalph Khaniyah Bezek, MD [facility] Pager [phone]  If 7PM-7AM, please contact night-coverage [email or web address] Password [email or web address]  02/12/2018, 3:27 PM

## 2018-02-12 NOTE — ED Provider Notes (Addendum)
[facility]-EMERGENCY DEPT Provider Note   CSN: [id number] Arrival date & time: 02/12/18  0903     History   Chief Complaint Chief Complaint  Patient presents with  . Cough  . Abnormal Lab    HPI Vickie Wilson is a 84 y.o. female.  HPI   She presents for evaluation of hyponatremia and renal insufficiency, found on labs done as an outpatient yesterday.  She had seen her doctor then for evaluation of ongoing cough and was prescribed prednisone because she was "wheezing."  She has had cough for several weeks which is mostly nonproductive.  Her appetite is been poor, but she has not had vomiting or diarrhea.  She denies fever, chills, chest pain, focal weakness or paresthesia.  She feels generally tired and weak.  No prior similar problems recently.  There are no other known modifying factors.  Past Medical History:  Diagnosis Date  . Allergy   . Anxiety   . Atrial mass    right  . Constipation   . Depression   . Diabetes mellitus   . Hyperlipidemia   . Hypertension   . Osteoporosis     Patient Active Problem List   Diagnosis Date Noted  . Aortic atherosclerosis (HCC) 08/11/2017  . Carotid bruit 08/11/2017  . Urinary tract infection without hematuria 05/28/2017  . Vaginitis 05/28/2017  . HTN (hypertension) 04/04/2017  . Female bladder prolapse 04/01/2017  . Dysuria 04/22/2016  . Dry skin dermatitis 09/04/2015  . Cerumen impaction 09/04/2015  . Cataract 02/01/2015  . Burn of hand, left 06/22/2014  . Calf pain 07/14/2013  . Tricuspid regurgitation   . Well adult exam 01/18/2013  . Nonspecific abnormal electrocardiogram (ECG) (EKG) 01/18/2013  . Heart murmur 01/18/2013  . Superficial phlebitis 10/21/2012  . Zoster 10/21/2012  . Hives 02/23/2012  . Arthralgia 09/01/2011  . Foot pain, right 04/22/2011  . CHANGE IN BOWELS 06/17/2010  . BLADDER PROLAPSE 01/28/2010  . Cough 01/28/2010  . ABDOMINAL BLOATING 07/12/2009  . TOBACCO USE, QUIT  07/12/2009  . ECZEMA 10/31/2008  . Dyslipidemia 06/28/2008  . Constipation 04/20/2008  . BLEPHARITIS 11/23/2007  . Actinic keratosis 09/05/2007  . Diabetic on diet only (HCC) 08/01/2007  . Anxiety state 06/29/2007  . Depression 06/29/2007  . Hypertensive renal disease 06/29/2007  . ALLERGIC RHINITIS 06/29/2007  . OSTEOPOROSIS 06/29/2007    Past Surgical History:  Procedure Laterality Date  . DILATION AND CURETTAGE OF UTERUS  1987     OB History   None      Home Medications    Prior to Admission medications   Medication Sig Start Date End Date Taking? Authorizing Provider  amLODipine (NORVASC) 5 MG tablet TAKE 1 TABLET ONCE DAILY. 01/18/18  Yes Plotnikov, Georgina Quint, MD  aspirin EC 81 MG tablet Take 81 mg by mouth daily.   Yes [provider]  atenolol (TENORMIN) 100 MG tablet TAKE (1/2) TABLET TWICE DAILY. 04/27/17  Yes Plotnikov, Georgina Quint, MD  losartan (COZAAR) 100 MG tablet TAKE 1 TABLET EACH DAY. 07/26/17  Yes Plotnikov, Georgina Quint, MD  Multiple Vitamins-Minerals (CENTRUM SILVER) CHEW Chew 1 each by mouth 3 (three) times a week.    Yes [provider]  PARoxetine (PAXIL) 10 MG tablet TAKE 1 TABLET IN THE MORNING. 11/22/17  Yes Plotnikov, Georgina Quint, MD  rosuvastatin (CRESTOR) 20 MG tablet Take 0.5 tablets (10 mg total) by mouth daily. 10/06/17  Yes Plotnikov, Georgina Quint, MD  triamterene-hydrochlorothiazide (MAXZIDE) 75-50 MG tablet TAKE (1/2)  TABLET DAILY IN THE MORNING. 09/24/17  Yes Plotnikov, Georgina Quint, MD  predniSONE (DELTASONE) 5 MG tablet Take 6 pills for first day, 5 pills second day, 4 pills third day, 3 pills fourth day, 2 pills the fifth day, and 1 pill sixth day. 02/11/18   [name], MD    Family History Family History  Problem Relation Age of Onset  . Heart disease Mother   . Alcohol abuse Father   . Heart disease Father   . Hyperlipidemia Other   . Hypertension Other   . Stroke Other     Social History Social History   Tobacco Use   . Smoking status: Former Smoker    Packs/day: 1.00    Years: 20.00    Pack years: 20.00    Last attempt to quit: 03/13/1998    Years since quitting: 19.9  . Smokeless tobacco: Never Used  Substance Use Topics  . Alcohol use: No  . Drug use: No     Allergies   Captopril   Review of Systems Review of Systems  All other systems reviewed and are negative.    Physical Exam Updated Vital Signs BP 111/64   Pulse (!) 59   Temp 98.2 F (36.8 C) (Oral)   Resp 15   SpO2 96%   Physical Exam  Constitutional: She is oriented to person, place, and time. She appears well-developed.  Elderly, frail  HENT:  Head: Normocephalic and atraumatic.  Eyes: Pupils are equal, round, and reactive to light. Conjunctivae and EOM are normal.  Neck: Normal range of motion and phonation normal. Neck supple.  Cardiovascular: Normal rate and regular rhythm.  Pulmonary/Chest: Effort normal and breath sounds normal. No stridor. No respiratory distress. She has no wheezes. She exhibits no tenderness.  Abdominal: Soft. She exhibits no distension. There is no tenderness. There is no guarding.  Musculoskeletal: Normal range of motion. She exhibits no edema or deformity.  Neurological: She is alert and oriented to person, place, and time. She exhibits normal muscle tone.  Skin: Skin is warm and dry.  Psychiatric: She has a normal mood and affect. Her behavior is normal. Judgment and thought content normal.  Nursing note and vitals reviewed.    ED Treatments / Results  Labs (all labs ordered are listed, but only abnormal results are displayed) Labs Reviewed  BASIC METABOLIC PANEL - Abnormal; Notable for the following components:      Result Value   Sodium 121 (*)    Potassium 2.8 (*)    Chloride 81 (*)    Glucose, Bld 125 (*)    BUN 32 (*)    Creatinine, Ser 2.04 (*)    GFR calc non Af Amer 21 (*)    GFR calc Af Amer 24 (*)    All other components within normal limits  CBC WITH  DIFFERENTIAL/PLATELET - Abnormal; Notable for the following components:   WBC 12.9 (*)    Hemoglobin 11.8 (*)    HCT 33.6 (*)    Neutro Abs 10.2 (*)    Monocytes Absolute 1.1 (*)    All other components within normal limits  URINALYSIS, ROUTINE W REFLEX MICROSCOPIC - Abnormal; Notable for the following components:   APPearance HAZY (*)    Specific Gravity, Urine <1.005 (*)    Hgb urine dipstick SMALL (*)    Leukocytes, UA LARGE (*)    All other components within normal limits  URINALYSIS, MICROSCOPIC (REFLEX) - Abnormal; Notable for the following components:   Bacteria,  UA RARE (*)    All other components within normal limits  MAGNESIUM    EKG None     Date: 02/12/18  Rate: 64  Rhythm: normal sinus rhythm  QRS Axis: normal  PR and QT Intervals: normal  ST/T Wave abnormalities: normal  PR and QRS Conduction Disutrbances:none  Narrative Interpretation: PVC present     Radiology Dg Chest 2 View  Result Date: 02/12/2018 CLINICAL DATA:  Cough. EXAM: CHEST - 2 VIEW COMPARISON:  None. FINDINGS: The heart, hila, and mediastinum are normal. Opacity in the medial right base is thought to be due to vascular crowding from the right hilum. No suspicious infiltrate. No pneumothorax. No nodule or mass. IMPRESSION: No active cardiopulmonary disease. Electronically Signed   By: Gerome Sam III M.D   On: 02/12/2018 10:44    Procedures .Critical Care Performed by: Mancel Bale, MD Authorized by: Mancel Bale, MD   Critical care provider statement:    Critical care time (minutes):  35   Critical care start time:  02/12/2018 9:37 AM   Critical care end time:  02/12/2018 1:25 PM   Critical care time was exclusive of:  Separately billable procedures and treating other patients   Critical care was necessary to treat or prevent imminent or life-threatening deterioration of the following conditions:  Dehydration and metabolic crisis   Critical care was time spent personally by me on the  following activities:  Blood draw for specimens, development of treatment plan with patient or surrogate, discussions with consultants, evaluation of patient's response to treatment, examination of patient, obtaining history from patient or surrogate, ordering and performing treatments and interventions, ordering and review of laboratory studies, pulse oximetry, re-evaluation of patient's condition, review of old charts and ordering and review of radiographic studies   (including critical care time)  Medications Ordered in ED Medications  0.9 %  sodium chloride infusion ( Intravenous New Bag/Given 02/12/18 1005)  potassium chloride 10 mEq in 100 mL IVPB (10 mEq Intravenous New Bag/Given 02/12/18 1346)  menthol-cetylpyridinium (CEPACOL) lozenge 3 mg (3 mg Oral Given 02/12/18 1308)  sodium chloride 0.9 % bolus 500 mL (0 mLs Intravenous Stopped 02/12/18 1055)  potassium chloride SA (K-DUR,KLOR-CON) CR tablet 40 mEq (40 mEq Oral Given 02/12/18 1142)     Initial Impression / Assessment and Plan / ED Course  I have reviewed the triage vital signs and the nursing notes.  Pertinent labs & imaging results that were available during my care of the patient were reviewed by me and considered in my medical decision making (see chart for details).  Clinical Course as of Feb 12 1438  Sat Feb 12, 2018  1242 Normal except sodium low, potassium low, chloride low, glucose high, BUN high, creatinine high, GFR low  Basic metabolic panel(!) [EW]  1243 Normal except white count high, hemoglobin low  CBC with Differential(!) [EW]  1243 No acute changes, images reviewed  DG Chest 2 View [EW]  1319 Normal except elevated LAE, white blood cells, bacteria, and epithelial cells.  This is nondiagnostic for UTI.  Urinalysis, Routine w reflex microscopic(!) [EW]  1336 At this point she has been able to tolerate some liquids, orally.  Granddaughter with her, is going to try giving her some food, applesauce.   [EW]    Clinical  Course User Index [EW] Mancel Bale, MD     Patient Vitals for the past 24 hrs:  BP Temp Temp src Pulse Resp SpO2  02/12/18 1401 - - - (!) 59 15  96 %  02/12/18 1330 111/64 - - (!) 58 17 98 %  02/12/18 1300 124/80 - - (!) 59 19 98 %  02/12/18 1230 139/67 - - 60 20 98 %  02/12/18 1207 123/78 - - (!) 59 17 97 %  02/12/18 1100 115/75 - - (!) 55 14 95 %  02/12/18 1052 129/71 - - 65 14 95 %  02/12/18 1000 131/69 - - 62 14 99 %  02/12/18 0927 128/74 98.2 F (36.8 C) Oral 66 18 96 %    2:39 PM Reevaluation with update and discussion. After initial assessment and treatment, an updated evaluation reveals she is comfortable at this time, eating and drinking.  Findings discussed with patient and family member, all questions answered. Mancel BaleElliott Lantz Hermann   Medical Decision Making: Nonspecific cough, with malaise, and decreased appetite.  Significant elevation of creatinine, from baseline and ongoing hyponatremia.  Baseline sodium, and renal function, 11 months ago was entirely normal.  No clear evidence for pneumonia or UTI.  Supplementation ED with IV fluids, and potassium has been initiated.  No immediately evident source for reversing the process which caused her hyponatremia and renal insufficiency.  Therefore will consider admission, for the patient.  CRITICAL CARE-yes Performed by: Mancel BaleElliott Isley Weisheit   Nursing Notes Reviewed/ Care Coordinated Applicable Imaging Reviewed Interpretation of Laboratory Data incorporated into ED treatment   1:38 PM-Consult complete with hospitalist. Patient case explained and discussed.  He agrees to admit patient for further evaluation and treatment. Call ended at 1440    Plan: Admit   Final Clinical Impressions(s) / ED Diagnoses   Final diagnoses:  Hyponatremia  Hypokalemia  AKI (acute kidney injury) (HCC)  Cough    ED Discharge Orders    None       Mancel BaleWentz, Kavon Valenza, MD 02/12/18 1440    Mancel BaleWentz, Maribell Demeo, MD 02/12/18 1513

## 2018-02-13 DIAGNOSIS — N179 Acute kidney failure, unspecified: Secondary | ICD-10-CM | POA: Diagnosis not present

## 2018-02-13 DIAGNOSIS — F411 Generalized anxiety disorder: Secondary | ICD-10-CM

## 2018-02-13 DIAGNOSIS — R05 Cough: Secondary | ICD-10-CM | POA: Diagnosis not present

## 2018-02-13 DIAGNOSIS — F3342 Major depressive disorder, recurrent, in full remission: Secondary | ICD-10-CM | POA: Diagnosis not present

## 2018-02-13 DIAGNOSIS — R5383 Other fatigue: Secondary | ICD-10-CM | POA: Insufficient documentation

## 2018-02-13 LAB — BASIC METABOLIC PANEL
Anion gap: 10 (ref 5–15)
Anion gap: 10 (ref 5–15)
Anion gap: 8 (ref 5–15)
BUN: 30 mg/dL — ABNORMAL HIGH (ref 6–20)
BUN: 30 mg/dL — ABNORMAL HIGH (ref 6–20)
BUN: 34 mg/dL — ABNORMAL HIGH (ref 6–20)
CO2: 21 mmol/L — ABNORMAL LOW (ref 22–32)
CO2: 24 mmol/L (ref 22–32)
CO2: 24 mmol/L (ref 22–32)
Calcium: 8.2 mg/dL — ABNORMAL LOW (ref 8.9–10.3)
Calcium: 8.4 mg/dL — ABNORMAL LOW (ref 8.9–10.3)
Calcium: 8.5 mg/dL — ABNORMAL LOW (ref 8.9–10.3)
Chloride: 100 mmol/L — ABNORMAL LOW (ref 101–111)
Chloride: 97 mmol/L — ABNORMAL LOW (ref 101–111)
Chloride: 99 mmol/L — ABNORMAL LOW (ref 101–111)
Creatinine, Ser: 1.39 mg/dL — ABNORMAL HIGH (ref 0.44–1.00)
Creatinine, Ser: 1.54 mg/dL — ABNORMAL HIGH (ref 0.44–1.00)
Creatinine, Ser: 1.62 mg/dL — ABNORMAL HIGH (ref 0.44–1.00)
GFR calc Af Amer: 32 mL/min — ABNORMAL LOW (ref >60)
GFR calc Af Amer: 34 mL/min — ABNORMAL LOW (ref >60)
GFR calc Af Amer: 39 mL/min — ABNORMAL LOW (ref >60)
GFR calc non Af Amer: 28 mL/min — ABNORMAL LOW (ref >60)
GFR calc non Af Amer: 30 mL/min — ABNORMAL LOW (ref >60)
GFR calc non Af Amer: 34 mL/min — ABNORMAL LOW (ref >60)
Glucose, Bld: 114 mg/dL — ABNORMAL HIGH (ref 65–99)
Glucose, Bld: 117 mg/dL — ABNORMAL HIGH (ref 65–99)
Glucose, Bld: 125 mg/dL — ABNORMAL HIGH (ref 65–99)
Potassium: 3.7 mmol/L (ref 3.5–5.1)
Potassium: 4 mmol/L (ref 3.5–5.1)
Potassium: 4.1 mmol/L (ref 3.5–5.1)
Sodium: 130 mmol/L — ABNORMAL LOW (ref 135–145)
Sodium: 131 mmol/L — ABNORMAL LOW (ref 135–145)
Sodium: 132 mmol/L — ABNORMAL LOW (ref 135–145)

## 2018-02-13 LAB — CBC
HCT: 30.5 % — ABNORMAL LOW (ref 36.0–46.0)
Hemoglobin: 10.5 g/dL — ABNORMAL LOW (ref 12.0–15.0)
MCH: 28.2 pg (ref 26.0–34.0)
MCHC: 34.4 g/dL (ref 30.0–36.0)
MCV: 81.8 fL (ref 78.0–100.0)
Platelets: 262 10*3/uL (ref 150–400)
RBC: 3.73 MIL/uL — ABNORMAL LOW (ref 3.87–5.11)
RDW: 14.5 % (ref 11.5–15.5)
WBC: 8.7 10*3/uL (ref 4.0–10.5)

## 2018-02-13 LAB — GLUCOSE, CAPILLARY: Glucose-Capillary: 97 mg/dL (ref 65–99)

## 2018-02-13 NOTE — Discharge Instructions (Signed)
Hyponatremia Hyponatremia is when the amount of salt (sodium) in your blood is too low. When salt levels are low, your cells absorb extra water and they swell. The swelling happens throughout the body, but it mostly affects the brain. Follow these instructions at home:  Take medicines only as told by your doctor. Many medicines can make this condition worse. Talk with your doctor about any medicines that you are currently taking.  Carefully follow a recommended diet as told by your doctor.  Carefully follow instructions from your doctor about fluid restrictions.  Keep all follow-up visits as told by your doctor. This is important.  Do not drink alcohol. Contact a doctor if:  You feel sicker to your stomach (nauseous).  You feel more confused.  You feel more tired (fatigued).  Your headache gets worse.  You feel weaker.  Your symptoms go away and then they come back.  You have trouble following the diet instructions. Get help right away if:  You start to twitch and shake (have a seizure).  You pass out (faint).  You keep having watery poop (diarrhea).  You keep throwing up (vomiting). This information is not intended to replace advice given to you by your health care provider. Make sure you discuss any questions you have with your health care provider. Document Released: 05/13/2011 Document Revised: 02/06/2016 Document Reviewed: 08/27/2014 Elsevier Interactive Patient Education  2018 [employer]vier Inc[employer]  

## 2018-02-13 NOTE — Assessment & Plan Note (Signed)
Has wheezing on exam. Possible to be exacerbated with viral illness.  - prednisone  - if ongoing consider xray

## 2018-02-13 NOTE — Assessment & Plan Note (Signed)
Unclear to source. Seems to be intermittent but has gotten worse lately.  - CMP, CBC, TSH

## 2018-02-13 NOTE — Discharge Summary (Signed)
Discharge Summary  Vickie Wilson ZOX:[id number] DOB: November 18, 1930  PCP: Tresa Garter, MD  Admit date: 02/12/2018 Discharge date: 02/13/2018  Time spent: 25 minutes  Recommendations for Outpatient Follow-up:  Follow-up with PCP Take your medication as prescribed  Discharge Diagnoses:  Active Hospital Problems   Diagnosis Date Noted  . Acute kidney injury (HCC) 02/12/2018  . Hyponatremia 02/12/2018  . HTN (hypertension) 04/04/2017  . Dyslipidemia 06/28/2008  . Diabetic on diet only (HCC) 08/01/2007  . Anxiety state 06/29/2007  . Depression 06/29/2007    Resolved Hospital Problems  No resolved problems to display.    Discharge Condition: Stable  Diet recommendation: Resume previous diet  Vitals:   02/12/18 2100 02/13/18 0501  BP: 97/79 123/62  Pulse: (!) 57 (!) 58  Resp: 16 16  Temp: 97.6 F (36.4 C) 98.4 F (36.9 C)  SpO2: 96% 97%    History of present illness:   Vickie Wilson is a 84 y.o. female with medical history significant of anxiety, depression, CKD stage II.  Patient presented secondary to an abnormal lab of a sodium of 120.  Yesterday, she went to her primary care physician's office for evaluation of a cough.  She was prescribed prednisone and labs were obtained.  Yesterday evening, her PCPs office called her and advised her to seek evaluation in the emergency department for hyponatremia and AKI.  She has had coughing without any productive sputum.  No wheezing or shortness of breath.  ED Course: Vitals: Afebrile, normal pulse, normal respirations, normotensive, on room air Labs: Sodium of 121, potassium of 2.8, creatinine of 2.04, WBC of 12.9k Imaging: Chest x-ray without active disease Medications/Course: Potassium, NS  02/13/2018: Patient seen and examined at bedside.  She has no new complaints.  She denies any headache, dizziness, lightheadedness, chest pain, dyspnea, palpitations.    On the day of discharge patient was hemodynamically stable she  will need to follow-up with her primary care provider post hospitalization.  Hospital Course:  Principal Problem:   Acute kidney injury [facility]) Active Problems:   Diabetic on diet only (HCC)   Dyslipidemia   Anxiety state   Depression   HTN (hypertension)   Hyponatremia  Acute kidney injury on CKD II, improving Suspect prerenal from dehydration Creatinine 1.39 on 02/13/2018 from 2.04 on presentation In setting of dehydration. Dry mucous membranes and decreased oral intake.  Avoid dehydration/nephrotoxic agents   Hyponatremia, resolving  Sodium 132 from 120 on presentation patient is dehydrated. She is also on hydrochlorothiazide and Paxil. -Discontinue hydrochlorothiazide -Will continue Paxil as this will need to be tapered Follow-up with your PCP  Hypokalemia, resolved Potassium 2.8 on presentation Repleted Potassium 4.0 on 02/13/2018  Hypertension Controlled. -Continue atenolol  Depression/anxiety Patient takes Paxil as an outpatient. She takes 5 mg daily -Paxil 5 mg daily  Diabetes mellitus, type 2 Diet controlled  Hyperlipidemia -Continue statin  Sinus bradycardia Asymptomatic Suspect iatrogenic secondary to beta-blocker use Heart rate between 54 and 58 Follow-up with outpatient PCP  History of atrial mass Self-reported it had resolved spontaneously   Procedures:  None  Consultations:  None  Discharge Exam: BP 123/62 (BP Location: Left Arm)   Pulse (!) 58   Temp 98.4 F (36.9 C) (Oral)   Resp 16   Ht 5\' 1"  (1.549 m)   Wt 53 kg (116 lb 13.5 oz)   SpO2 97%   BMI 22.08 kg/m  . General: 84 y.o. year-old female well developed well nourished in no acute distress.  Alert and  oriented x3. . Cardiovascular: Regular rate and rhythm with no rubs or gallops.  No thyromegaly or JVD noted.   Marland Kitchen Respiratory: Clear to auscultation with no wheezes or rales. Good inspiratory effort. . Abdomen: Soft nontender nondistended with normal bowel sounds x4  quadrants. . Musculoskeletal: No lower extremity edema. 2/4 pulses in all 4 extremities. . Skin: No ulcerative lesions noted or rashes, . Psychiatry: Mood is appropriate for condition and setting  Discharge Instructions You were cared for by a hospitalist during your hospital stay. If you have any questions about your discharge medications or the care you received while you were in the hospital after you are discharged, you can call the unit and asked to speak with the hospitalist on call if the hospitalist that took care of you is not available. Once you are discharged, your primary care physician will handle any further medical issues. Please note that NO REFILLS for any discharge medications will be authorized once you are discharged, as it is imperative that you return to your primary care physician (or establish a relationship with a primary care physician if you do not have one) for your aftercare needs so that they can reassess your need for medications and monitor your lab values.   Allergies as of 02/13/2018      Reactions   Captopril    REACTION: cough      Medication List    STOP taking these medications   losartan 100 MG tablet Commonly known as:  COZAAR   triamterene-hydrochlorothiazide 75-50 MG tablet Commonly known as:  MAXZIDE     TAKE these medications   amLODipine 5 MG tablet Commonly known as:  NORVASC TAKE 1 TABLET ONCE DAILY.   aspirin EC 81 MG tablet Take 81 mg by mouth daily.   atenolol 100 MG tablet Commonly known as:  TENORMIN TAKE (1/2) TABLET TWICE DAILY.   CENTRUM SILVER Chew Chew 1 each by mouth 3 (three) times a week.   PARoxetine 10 MG tablet Commonly known as:  PAXIL TAKE 1 TABLET IN THE MORNING.   predniSONE 5 MG tablet Commonly known as:  DELTASONE Take 6 pills for first day, 5 pills second day, 4 pills third day, 3 pills fourth day, 2 pills the fifth day, and 1 pill sixth day.   rosuvastatin 20 MG tablet Commonly known as:   CRESTOR Take 0.5 tablets (10 mg total) by mouth daily.      Allergies  Allergen Reactions  . Captopril     REACTION: cough   Follow-up Information    Plotnikov, Georgina Quint, MD. Call in 1 day(s).   Specialty:  Internal Medicine Why:  Please call for an appointment. Contact information: [address] [place] [address] [phone]            The results of significant diagnostics from this hospitalization (including imaging, microbiology, ancillary and laboratory) are listed below for reference.    Significant Diagnostic Studies: Dg Chest 2 View  Result Date: 02/12/2018 CLINICAL DATA:  Cough. EXAM: CHEST - 2 VIEW COMPARISON:  None. FINDINGS: The heart, hila, and mediastinum are normal. Opacity in the medial right base is thought to be due to vascular crowding from the right hilum. No suspicious infiltrate. No pneumothorax. No nodule or mass. IMPRESSION: No active cardiopulmonary disease. Electronically Signed   By: Gerome Sam III M.D   On: 02/12/2018 10:44    Microbiology: No results found for this or any previous visit (from the past 240 hour(s)).  Labs: Basic Metabolic Panel: Recent Labs  Lab 02/12/18 0952 02/12/18 1656 02/12/18 1910 02/12/18 2337 02/13/18 0320 02/13/18 0736  NA 121*  --  129* 131* 130* 132*  K 2.8*  --  3.3* 4.1 3.7 4.0  CL 81*  --  95* 97* 99* 100*  CO2 25  --  25 24 21* 24  GLUCOSE 125*  --  138* 114* 125* 117*  BUN 32*  --  33* 34* 30* 30*  CREATININE 2.04*  --  1.75* 1.62* 1.54* 1.39*  CALCIUM 9.0  --  8.1* 8.4* 8.2* 8.5*  MG  --  2.2  --   --   --   --    Liver Function Tests: Recent Labs  Lab 02/11/18 1355  AST 18  ALT 9  ALKPHOS 57  BILITOT 0.7  PROT 7.4  ALBUMIN 4.0   No results for input(s): LIPASE, AMYLASE in the last 168 hours. No results for input(s): AMMONIA in the last 168 hours. CBC: Recent Labs  Lab 02/11/18 1355 02/12/18 0952 02/13/18 0320  WBC 14.2* 12.9* 8.7  NEUTROABS 11.1* 10.2*  --   HGB 11.3*  11.8* 10.5*  HCT 33.1* 33.6* 30.5*  MCV 82.3 80.4 81.8  PLT 288.0 326 262   Cardiac Enzymes: No results for input(s): CKTOTAL, CKMB, CKMBINDEX, TROPONINI in the last 168 hours. BNP: BNP (last 3 results) No results for input(s): BNP in the last 8760 hours.  ProBNP (last 3 results) No results for input(s): PROBNP in the last 8760 hours.  CBG: Recent Labs  Lab 02/12/18 1833 02/12/18 2102 02/13/18 0738  GLUCAP 156* 110* 97       Signed:  Darlin Droparole N Annarose Ouellet, MD [facility] 02/13/2018, 11:59 AM

## 2018-02-14 ENCOUNTER — Telehealth: Payer: Self-pay

## 2018-02-14 ENCOUNTER — Telehealth: Payer: Self-pay | Admitting: *Deleted

## 2018-02-14 LAB — OSMOLALITY, URINE: Osmolality, Ur: 258 mOsm/kg — ABNORMAL LOW (ref 300–900)

## 2018-02-14 NOTE — Telephone Encounter (Signed)
Appointment scheduled.

## 2018-02-14 NOTE — Telephone Encounter (Signed)
Called pt to confirm appt that has been made for 02/15/18 w/[name] since MD is out of the office. Pt is aware of appt was made by her this am. Inform had some additional question concerning hosp stay completed TCM call below.[name]./lmb  Transition Care Management Follow-up Telephone Call   Date discharged? 02/13/18   How have you been since you were released from the hospital? Pt states she seem to be feeling ok today   Do you understand why you were in the hospital? YES   Do you understand the discharge instructions? YES   Where were you discharged to? Home   Items Reviewed:  Medications reviewed: YES  Allergies reviewed: YES  Dietary changes reviewed: YES  Referrals reviewed: No referral needed   Functional Questionnaire:   Activities of Daily Living (ADLs):   She states she are independent in the following: bathing and hygiene, feeding, continence, grooming, toileting and dressing States she require assistance with the following: ambulation   Any transportation issues/concerns?: NO   Any patient concerns? NO   Confirmed importance and date/time of follow-up visits scheduled YES, appt 02/15/18  Provider Appointment booked with [name] since PCP is out of the office this week  Confirmed with patient if condition begins to worsen call PCP or go to the ER.  Patient was given the office number and encouraged to call back with question or concerns.  : YES

## 2018-02-14 NOTE — Telephone Encounter (Signed)
Yes that will be fine   Copied from CRM [id number]. Topic: General - Other >> Feb 14, 2018 10:24 AM Elliot GaultBell, Tiffany M wrote: Relation to pt: self  Call back number: [phone]  Reason for call:  Patient was seen in the ED 02/12/18 and states they advised her to follow up with PCP within 1 to 2 days, patient was offered Friday 02/18/18 appointment with PCP but patient states that date is to far out. Patient was offered to see NP, Ria ClockLaura Murray tomorrow 02/15/18 and patient states she would like to see a MD only, please advise  >> Feb 14, 2018 10:29 AM Elliot GaultBell, Tiffany M wrote: Relation to pt: self  Call back number: [phone]  Reason for call:  Patient was seen in the ED 02/12/18 for cough and states they advised her to follow up with PCP within 1 to 2 days, patient was offered Friday 02/18/18 appointment with PCP but patient states that date is to far out. Patient was offered to see NP, Ria ClockLaura Murray tomorrow 02/15/18 and patient states she would like to see a MD only, please advise  >> Feb 14, 2018 10:50 AM Morphies, Hermine MessickCarson J wrote: Would [name] be okay seeing this patient to follow up from the ED? Please advise.

## 2018-02-15 ENCOUNTER — Encounter: Payer: Self-pay | Admitting: Internal Medicine

## 2018-02-15 ENCOUNTER — Other Ambulatory Visit (INDEPENDENT_AMBULATORY_CARE_PROVIDER_SITE_OTHER): Payer: Medicare Other

## 2018-02-15 ENCOUNTER — Ambulatory Visit (INDEPENDENT_AMBULATORY_CARE_PROVIDER_SITE_OTHER): Payer: Medicare Other | Admitting: Internal Medicine

## 2018-02-15 VITALS — BP 124/78 | HR 79 | Temp 98.1°F | Ht 61.0 in | Wt 112.0 lb

## 2018-02-15 DIAGNOSIS — E119 Type 2 diabetes mellitus without complications: Secondary | ICD-10-CM

## 2018-02-15 DIAGNOSIS — N179 Acute kidney failure, unspecified: Secondary | ICD-10-CM

## 2018-02-15 DIAGNOSIS — K5901 Slow transit constipation: Secondary | ICD-10-CM

## 2018-02-15 DIAGNOSIS — I10 Essential (primary) hypertension: Secondary | ICD-10-CM

## 2018-02-15 LAB — BASIC METABOLIC PANEL
BUN: 16 mg/dL (ref 6–23)
CO2: 30 mEq/L (ref 19–32)
Calcium: 10 mg/dL (ref 8.4–10.5)
Chloride: 92 mEq/L — ABNORMAL LOW (ref 96–112)
Creatinine, Ser: 1.2 mg/dL (ref 0.40–1.20)
GFR: 45.38 mL/min — ABNORMAL LOW (ref >60.00)
Glucose, Bld: 112 mg/dL — ABNORMAL HIGH (ref 70–99)
Potassium: 4.5 mEq/L (ref 3.5–5.1)
Sodium: 131 mEq/L — ABNORMAL LOW (ref 135–145)

## 2018-02-15 NOTE — Assessment & Plan Note (Signed)
stable overall by history and exam, recent data reviewed with pt, and pt to continue medical treatment as before,  to f/u any worsening symptoms or concerns Lab Results  Component Value Date   HGBA1C 6.5 02/23/2017

## 2018-02-15 NOTE — Assessment & Plan Note (Signed)
Improved, for f/u lab today, cont same med tx

## 2018-02-15 NOTE — Assessment & Plan Note (Signed)
Stable, will cont to hold the losartan and diuretic for now, cont to monitor BP at home and next visit,  to f/u any worsening symptoms or concerns

## 2018-02-15 NOTE — Assessment & Plan Note (Signed)
Improved with pessary change and add Fiber 1 per gyn

## 2018-02-15 NOTE — Progress Notes (Signed)
Subjective:    Patient ID: Vickie Wilson, female    DOB: 02/01/1931, 84 y.o.   MRN: [id number]  HPI Here to f/u recent hospn 6/1 - 6/2 with medical history significant ofanxiety, depression, CKD stage II.Patient presented secondary to an abnormal lab of a sodium of 120. she had gone to see Dr Milagros ReapSchimtz may 31 office for evaluation of a cough. She was prescribed prednisone and labs were obtained.  Soon after her PCPs office called her and advised her to seek evaluation in the emergency department for hyponatremia and AKI. She has had coughing without any productive sputum. No wheezing or shortness of breath. Tx with IVF, and AKI improved, as well as sodium improved to 132, and low K resolved.  Improved for d/c and losartan and diuretic stopped at d/c. Pt thinks her cough was not too significant, no UTI symptoms or other fever illness;  Did have some constipation for over 10 wks per pt which affected her po intake.  Has appt PCP on June 19.  No other new complaints Past Medical History:  Diagnosis Date  . Allergy   . Anxiety   . Atrial mass    right  . Constipation   . Depression   . Diabetes mellitus   . Hyperlipidemia   . Hypertension   . Osteoporosis    Past Surgical History:  Procedure Laterality Date  . DILATION AND CURETTAGE OF UTERUS  1987    reports that she quit smoking about 19 years ago. She has a 20.00 pack-year smoking history. She has never used smokeless tobacco. She reports that she does not drink alcohol or use drugs. family history includes Alcohol abuse in her father; Heart disease in her father and mother; Hyperlipidemia in her other; Hypertension in her other; Stroke in her other. Allergies  Allergen Reactions  . Captopril     REACTION: cough   Current Outpatient Medications on File Prior to Visit  Medication Sig Dispense Refill  . amLODipine (NORVASC) 5 MG tablet TAKE 1 TABLET ONCE DAILY. 90 tablet 3  . aspirin EC 81 MG tablet Take 81 mg by mouth daily.      Marland Kitchen. atenolol (TENORMIN) 100 MG tablet TAKE (1/2) TABLET TWICE DAILY. 90 tablet 0  . Multiple Vitamins-Minerals (CENTRUM SILVER) CHEW Chew 1 each by mouth 3 (three) times a week.     Marland Kitchen. PARoxetine (PAXIL) 10 MG tablet TAKE 1 TABLET IN THE MORNING. 90 tablet 1  . rosuvastatin (CRESTOR) 20 MG tablet Take 0.5 tablets (10 mg total) by mouth daily. 45 tablet 3   No current facility-administered medications on file prior to visit.    Review of Systems  Constitutional: Negative for other unusual diaphoresis or sweats HENT: Negative for ear discharge or swelling Eyes: Negative for other worsening visual disturbances Respiratory: Negative for stridor or other swelling  Gastrointestinal: Negative for worsening distension or other blood Genitourinary: Negative for retention or other urinary change Musculoskeletal: Negative for other MSK pain or swelling Skin: Negative for color change or other new lesions Neurological: Negative for worsening tremors and other numbness  Psychiatric/Behavioral: Negative for worsening agitation or other fatigue All other system neg per pt    Objective:   Physical Exam BP 124/78   Pulse 79   Temp 98.1 F (36.7 C) (Oral)   Ht 5\' 1"  (1.549 m)   Wt 112 lb (50.8 kg)   SpO2 95%   BMI 21.16 kg/m  VS noted, not ill appearing Constitutional: Pt appears in NAD  HENT: Head: NCAT.  Right Ear: External ear normal.  Left Ear: External ear normal.  Eyes: . Pupils are equal, round, and reactive to light. Conjunctivae and EOM are normal Nose: without d/c or deformity Neck: Neck supple. Gross normal ROM Cardiovascular: Normal rate and regular rhythm.   Pulmonary/Chest: Effort normal and breath sounds without rales or wheezing.  Abd:  Soft, NT, ND, + BS, no organomegaly Neurological: Pt is alert. At baseline orientation, motor grossly intact Skin: Skin is warm. No rashes, other new lesions, no LE edema Psychiatric: Pt behavior is normal without agitation  No other exam  findings    Assessment & Plan:

## 2018-02-15 NOTE — Patient Instructions (Addendum)

## 2018-03-02 ENCOUNTER — Other Ambulatory Visit (INDEPENDENT_AMBULATORY_CARE_PROVIDER_SITE_OTHER): Payer: Medicare Other

## 2018-03-02 ENCOUNTER — Encounter: Payer: Self-pay | Admitting: Internal Medicine

## 2018-03-02 ENCOUNTER — Ambulatory Visit (INDEPENDENT_AMBULATORY_CARE_PROVIDER_SITE_OTHER): Payer: Medicare Other | Admitting: Internal Medicine

## 2018-03-02 VITALS — BP 132/84 | HR 61 | Temp 98.3°F | Ht 61.0 in | Wt 112.0 lb

## 2018-03-02 DIAGNOSIS — I519 Heart disease, unspecified: Secondary | ICD-10-CM | POA: Diagnosis not present

## 2018-03-02 DIAGNOSIS — F411 Generalized anxiety disorder: Secondary | ICD-10-CM | POA: Diagnosis not present

## 2018-03-02 DIAGNOSIS — N179 Acute kidney failure, unspecified: Secondary | ICD-10-CM | POA: Diagnosis not present

## 2018-03-02 DIAGNOSIS — E119 Type 2 diabetes mellitus without complications: Secondary | ICD-10-CM

## 2018-03-02 DIAGNOSIS — E876 Hypokalemia: Secondary | ICD-10-CM

## 2018-03-02 DIAGNOSIS — I10 Essential (primary) hypertension: Secondary | ICD-10-CM | POA: Diagnosis not present

## 2018-03-02 DIAGNOSIS — E871 Hypo-osmolality and hyponatremia: Secondary | ICD-10-CM | POA: Diagnosis not present

## 2018-03-02 DIAGNOSIS — I5189 Other ill-defined heart diseases: Secondary | ICD-10-CM

## 2018-03-02 LAB — BASIC METABOLIC PANEL
BUN: 16 mg/dL (ref 6–23)
CO2: 28 mEq/L (ref 19–32)
Calcium: 9.5 mg/dL (ref 8.4–10.5)
Chloride: 101 mEq/L (ref 96–112)
Creatinine, Ser: 1.04 mg/dL (ref 0.40–1.20)
GFR: 53.52 mL/min — ABNORMAL LOW (ref >60.00)
Glucose, Bld: 98 mg/dL (ref 70–99)
Potassium: 3.9 mEq/L (ref 3.5–5.1)
Sodium: 137 mEq/L (ref 135–145)

## 2018-03-02 LAB — TSH: TSH: 3.48 u[IU]/mL (ref 0.35–4.50)

## 2018-03-02 LAB — HEMOGLOBIN A1C: Hgb A1c MFr Bld: 6.3 % (ref 4.6–6.5)

## 2018-03-02 NOTE — Assessment & Plan Note (Signed)
Maxzide, Losartan were stopped due to low K, low Na, CRF. Now on Norvasc, Atenolol Labs

## 2018-03-02 NOTE — Assessment & Plan Note (Signed)
Paxil 

## 2018-03-02 NOTE — Progress Notes (Signed)
Subjective:  Patient ID: Vickie Wilson, female    DOB: 29-Nov-1930  Age: 84 y.o. MRN: [id number]  CC: No chief complaint on file.   HPI Vickie Wilson presents for HTN, CRI, cough Post-hospital f/u - records reviewed. D/c on 02/13/18 reviewed. Losartan, Maxzide were stopped. Per hx:   "Unk Pinto a 84 y.o.femalewith medical history significant ofanxiety, depression, CKD stage II.Patient presented secondary to an abnormal lab of a sodium of 120. Yesterday, she went to her primary care physician's office for evaluation of a cough. She was prescribed prednisone and labs were obtained. Yesterday evening, her PCPs office called her and advised her to seek evaluation in the emergency department for hyponatremia and AKI. She has had coughing without any productive sputum. No wheezing or shortness of breath.  ED Course: Vitals:Afebrile, normal pulse, normal respirations, normotensive, on room air Labs:Sodium of 121, potassium of 2.8, creatinine of 2.04, WBC of 12.9k Imaging:Chest x-ray without active disease Medications/Course:Potassium, NS  02/13/2018: Patient seen and examined at bedside.  She has no new complaints.  She denies any headache, dizziness, lightheadedness, chest pain, dyspnea, palpitations.    On the day of discharge patient was hemodynamically stable she will need to follow-up with her primary care provider post hospitalization.  Hospital Course:  Principal Problem:   Acute kidney injury [facility]) Active Problems:   Diabetic on diet only (HCC)   Dyslipidemia   Anxiety state   Depression   HTN (hypertension)   Hyponatremia  Acute kidney injury on CKD II, improving Suspect prerenal from dehydration Creatinine 1.39 on 02/13/2018 from 2.04 on presentation In setting of dehydration. Dry mucous membranes and decreased oral intake.  Avoid dehydration/nephrotoxic agents   Hyponatremia, resolving  Sodium 132 from 120 on presentation patient is dehydrated.  She is also on hydrochlorothiazide and Paxil. -Discontinue hydrochlorothiazide -Will continue Paxil as this will need to be tapered Follow-up with your PCP  Hypokalemia, resolved Potassium 2.8 on presentation Repleted Potassium 4.0 on 02/13/2018  Hypertension Controlled. -Continue atenolol  Depression/anxiety Patient takes Paxil as an outpatient. She takes 5 mg daily -Paxil 5 mg daily  Diabetes mellitus, type 2 Diet controlled  Hyperlipidemia -Continue statin  Sinus bradycardia Asymptomatic Suspect iatrogenic secondary to beta-blocker use Heart rate between 54 and 58 Follow-up with outpatient PCP  History of atrial mass Self-reported it had resolved spontaneously"    Outpatient Medications Prior to Visit  Medication Sig Dispense Refill  . amLODipine (NORVASC) 5 MG tablet TAKE 1 TABLET ONCE DAILY. 90 tablet 3  . aspirin EC 81 MG tablet Take 81 mg by mouth daily.    Marland Kitchen atenolol (TENORMIN) 100 MG tablet TAKE (1/2) TABLET TWICE DAILY. 90 tablet 0  . Multiple Vitamins-Minerals (CENTRUM SILVER) CHEW Chew 1 each by mouth 3 (three) times a week.     Marland Kitchen PARoxetine (PAXIL) 10 MG tablet TAKE 1 TABLET IN THE MORNING. 90 tablet 1  . rosuvastatin (CRESTOR) 20 MG tablet Take 0.5 tablets (10 mg total) by mouth daily. 45 tablet 3   No facility-administered medications prior to visit.     ROS: Review of Systems  Constitutional: Positive for fatigue. Negative for activity change, appetite change, chills and unexpected weight change.  HENT: Negative for congestion, mouth sores and sinus pressure.   Eyes: Negative for visual disturbance.  Respiratory: Positive for cough. Negative for chest tightness.   Gastrointestinal: Negative for abdominal pain and nausea.  Genitourinary: Negative for difficulty urinating, frequency and vaginal pain.  Musculoskeletal: Negative for back pain and  gait problem.  Skin: Negative for pallor and rash.  Neurological: Negative for dizziness,  tremors, weakness, numbness and headaches.  Psychiatric/Behavioral: Negative for confusion and sleep disturbance.    Objective:  BP 132/84 (BP Location: Left Arm, Patient Position: Sitting, Cuff Size: Normal)   Pulse 61   Temp 98.3 F (36.8 C) (Oral)   Ht 5\' 1"  (1.549 m)   Wt 112 lb (50.8 kg)   SpO2 98%   BMI 21.16 kg/m   BP Readings from Last 3 Encounters:  03/02/18 132/84  02/15/18 124/78  02/13/18 123/62    Wt Readings from Last 3 Encounters:  03/02/18 112 lb (50.8 kg)  02/15/18 112 lb (50.8 kg)  02/12/18 116 lb 13.5 oz (53 kg)    Physical Exam  Constitutional: She appears well-developed. No distress.  HENT:  Head: Normocephalic.  Right Ear: External ear normal.  Left Ear: External ear normal.  Nose: Nose normal.  Mouth/Throat: Oropharynx is clear and moist.  Eyes: Pupils are equal, round, and reactive to light. Conjunctivae are normal. Right eye exhibits no discharge. Left eye exhibits no discharge.  Neck: Normal range of motion. Neck supple. No JVD present. No tracheal deviation present. No thyromegaly present.  Cardiovascular: Normal rate, regular rhythm and normal heart sounds.  Pulmonary/Chest: No stridor. No respiratory distress. She has no wheezes.  Abdominal: Soft. Bowel sounds are normal. She exhibits no distension and no mass. There is no tenderness. There is no rebound and no guarding.  Musculoskeletal: She exhibits no edema or tenderness.  Lymphadenopathy:    She has no cervical adenopathy.  Neurological: She displays normal reflexes. No cranial nerve deficit. She exhibits normal muscle tone. Coordination normal.  Skin: No rash noted. No erythema.  Psychiatric: She has a normal mood and affect. Her behavior is normal. Judgment and thought content normal.    Lab Results  Component Value Date   WBC 8.7 02/13/2018   HGB 10.5 (L) 02/13/2018   HCT 30.5 (L) 02/13/2018   PLT 262 02/13/2018   GLUCOSE 112 (H) 02/15/2018   CHOL 184 02/23/2017   TRIG 80.0  02/23/2017   HDL 61.60 02/23/2017   LDLDIRECT 125.6 01/16/2013   LDLCALC 106 (H) 02/23/2017   ALT 9 02/11/2018   AST 18 02/11/2018   NA 131 (L) 02/15/2018   K 4.5 02/15/2018   CL 92 (L) 02/15/2018   CREATININE 1.20 02/15/2018   BUN 16 02/15/2018   CO2 30 02/15/2018   TSH 1.70 02/11/2018   HGBA1C 6.5 02/23/2017   MICROALBUR 2.8 (H) 09/17/2016    Dg Chest 2 View  Result Date: 02/12/2018 CLINICAL DATA:  Cough. EXAM: CHEST - 2 VIEW COMPARISON:  None. FINDINGS: The heart, hila, and mediastinum are normal. Opacity in the medial right base is thought to be due to vascular crowding from the right hilum. No suspicious infiltrate. No pneumothorax. No nodule or mass. IMPRESSION: No active cardiopulmonary disease. Electronically Signed   By: Gerome Samavid  Williams III M.D   On: 02/12/2018 10:44    Assessment & Plan:   There are no diagnoses linked to this encounter.   No orders of the defined types were placed in this encounter.    Follow-up: No follow-ups on file.  Sonda PrimesAlex Jacci Ruberg, MD

## 2018-03-02 NOTE — Assessment & Plan Note (Signed)
Resolved per ECHO in 2015 Repeat ECHO

## 2018-03-02 NOTE — Assessment & Plan Note (Addendum)
Maxzide, Losartan were stopped due to low K, low Na, CRF. Now on Norvasc, Atenolol Labs 

## 2018-03-02 NOTE — Assessment & Plan Note (Signed)
Maxzide, Losartan were stopped due to low K, low Na, CRF. Now on Norvasc, Atenolol

## 2018-03-02 NOTE — Assessment & Plan Note (Signed)
BMET Hydration  

## 2018-03-03 ENCOUNTER — Telehealth: Payer: Self-pay

## 2018-03-03 NOTE — Telephone Encounter (Signed)
Please advise  Copied from CRM [id number]. Topic: Quick Communication - See Telephone Encounter >> Mar 03, 2018 10:58 AM Herby AbrahamJohnson, Shiquita C wrote: CRM for notification. See Telephone encounter for: 03/03/18.   Pt called into get [facility]. Pt says that provider mentioned her receiving a echo but she is not sure of when to have it completed OR if she even need to have it? I did advise pt of echo details. Pt also stated that she misspoke in visit she had taken cough syrup for her cough, she wanted to make provider aware. Please advise pt further.  [phone]

## 2018-03-04 NOTE — Telephone Encounter (Signed)
Noted ECHO had been ordered Thx

## 2018-03-10 NOTE — Telephone Encounter (Signed)
Pt.notified

## 2018-03-21 ENCOUNTER — Other Ambulatory Visit: Payer: Self-pay | Admitting: Internal Medicine

## 2018-04-08 ENCOUNTER — Encounter: Payer: Self-pay | Admitting: Internal Medicine

## 2018-04-15 ENCOUNTER — Ambulatory Visit (INDEPENDENT_AMBULATORY_CARE_PROVIDER_SITE_OTHER): Payer: Medicare Other | Admitting: Internal Medicine

## 2018-04-15 ENCOUNTER — Encounter: Payer: Self-pay | Admitting: Internal Medicine

## 2018-04-15 DIAGNOSIS — F411 Generalized anxiety disorder: Secondary | ICD-10-CM | POA: Diagnosis not present

## 2018-04-15 DIAGNOSIS — E119 Type 2 diabetes mellitus without complications: Secondary | ICD-10-CM | POA: Diagnosis not present

## 2018-04-15 DIAGNOSIS — G459 Transient cerebral ischemic attack, unspecified: Secondary | ICD-10-CM

## 2018-04-15 DIAGNOSIS — I10 Essential (primary) hypertension: Secondary | ICD-10-CM

## 2018-04-15 MED ORDER — ASPIRIN EC 81 MG PO TBEC: 162.0000 mg | DELAYED_RELEASE_TABLET | Freq: Every day | ORAL | 3 refills | Status: DC

## 2018-04-15 NOTE — Assessment & Plan Note (Signed)
Norvasc, Atenolol 

## 2018-04-15 NOTE — Assessment & Plan Note (Signed)
ADA diet.

## 2018-04-15 NOTE — Assessment & Plan Note (Signed)
Paxil 

## 2018-04-15 NOTE — Progress Notes (Signed)
Subjective:  Patient ID: Vickie Wilson, female    DOB: Oct 18, 1930  Age: 84 y.o. MRN: [id number]  CC: No chief complaint on file.   HPI Vickie Wilson presents for HTN, dyslipidemia. C/o episodic numbness in the L hand/arm x 15 min. Later it went to the L face x15 min on Mon-Tue. The pt did not have carot Doppler done in 2018  Outpatient Medications Prior to Visit  Medication Sig Dispense Refill  . amLODipine (NORVASC) 5 MG tablet TAKE 1 TABLET ONCE DAILY. 90 tablet 3  . aspirin EC 81 MG tablet Take 81 mg by mouth daily.    Marland Kitchen atenolol (TENORMIN) 100 MG tablet TAKE (1/2) TABLET TWICE DAILY. 30 tablet 3  . Multiple Vitamins-Minerals (CENTRUM SILVER) CHEW Chew 1 each by mouth 3 (three) times a week.     Marland Kitchen PARoxetine (PAXIL) 10 MG tablet TAKE 1 TABLET IN THE MORNING. 90 tablet 1  . rosuvastatin (CRESTOR) 20 MG tablet Take 0.5 tablets (10 mg total) by mouth daily. 45 tablet 3   No facility-administered medications prior to visit.     ROS: Review of Systems  Constitutional: Negative for activity change, appetite change, chills, fatigue and unexpected weight change.  HENT: Negative for congestion, mouth sores and sinus pressure.   Eyes: Negative for visual disturbance.  Respiratory: Negative for cough and chest tightness.   Gastrointestinal: Negative for abdominal pain and nausea.  Genitourinary: Negative for difficulty urinating, frequency and vaginal pain.  Musculoskeletal: Negative for back pain and gait problem.  Skin: Negative for pallor and rash.  Neurological: Negative for dizziness, tremors, weakness, numbness and headaches.  Psychiatric/Behavioral: Negative for confusion and sleep disturbance.    Objective:  BP 132/78 (BP Location: Left Arm, Patient Position: Sitting, Cuff Size: Normal)   Pulse (!) 54   Temp 98.4 F (36.9 C) (Oral)   Ht 5\' 1"  (1.549 m)   Wt 112 lb (50.8 kg)   SpO2 97%   BMI 21.16 kg/m   BP Readings from Last 3 Encounters:  04/15/18 132/78    03/02/18 132/84  02/15/18 124/78    Wt Readings from Last 3 Encounters:  04/15/18 112 lb (50.8 kg)  03/02/18 112 lb (50.8 kg)  02/15/18 112 lb (50.8 kg)    Physical Exam  Constitutional: She appears well-developed. No distress.  HENT:  Head: Normocephalic.  Right Ear: External ear normal.  Left Ear: External ear normal.  Nose: Nose normal.  Mouth/Throat: Oropharynx is clear and moist.  Eyes: Pupils are equal, round, and reactive to light. Conjunctivae are normal. Right eye exhibits no discharge. Left eye exhibits no discharge.  Neck: Normal range of motion. Neck supple. No JVD present. No tracheal deviation present. No thyromegaly present.  Cardiovascular: Normal rate, regular rhythm and normal heart sounds.  Pulmonary/Chest: No stridor. No respiratory distress. She has no wheezes.  Abdominal: Soft. Bowel sounds are normal. She exhibits no distension and no mass. There is no tenderness. There is no rebound and no guarding.  Musculoskeletal: She exhibits no edema or tenderness.  Lymphadenopathy:    She has no cervical adenopathy.  Neurological: She displays normal reflexes. No cranial nerve deficit. She exhibits normal muscle tone. Coordination normal.  Skin: No rash noted. No erythema.  Psychiatric: She has a normal mood and affect. Her behavior is normal. Judgment and thought content normal.    Lab Results  Component Value Date   WBC 8.7 02/13/2018   HGB 10.5 (L) 02/13/2018   HCT 30.5 (L) 02/13/2018  PLT 262 02/13/2018   GLUCOSE 98 03/02/2018   CHOL 184 02/23/2017   TRIG 80.0 02/23/2017   HDL 61.60 02/23/2017   LDLDIRECT 125.6 01/16/2013   LDLCALC 106 (H) 02/23/2017   ALT 9 02/11/2018   AST 18 02/11/2018   NA 137 03/02/2018   K 3.9 03/02/2018   CL 101 03/02/2018   CREATININE 1.04 03/02/2018   BUN 16 03/02/2018   CO2 28 03/02/2018   TSH 3.48 03/02/2018   HGBA1C 6.3 03/02/2018   MICROALBUR 2.8 (H) 09/17/2016    Dg Chest 2 View  Result Date:  02/12/2018 CLINICAL DATA:  Cough. EXAM: CHEST - 2 VIEW COMPARISON:  None. FINDINGS: The heart, hila, and mediastinum are normal. Opacity in the medial right base is thought to be due to vascular crowding from the right hilum. No suspicious infiltrate. No pneumothorax. No nodule or mass. IMPRESSION: No active cardiopulmonary disease. Electronically Signed   By: Gerome Samavid  Williams III M.D   On: 02/12/2018 10:44    Assessment & Plan:   There are no diagnoses linked to this encounter.   No orders of the defined types were placed in this encounter.    Follow-up: No follow-ups on file.  Sonda PrimesAlex Plotnikov, MD

## 2018-04-15 NOTE — Assessment & Plan Note (Signed)
probable TIA 7/19 L arm/face Increase ASA Carot doppler US Crestor To ER

## 2018-04-18 NOTE — Addendum Note (Signed)
Addended by: Scarlett PrestoFRIEDENBACH, Alhassan Everingham on: 04/18/2018 10:37 AM   Modules accepted: Orders

## 2018-04-21 ENCOUNTER — Ambulatory Visit: Payer: Self-pay

## 2018-04-21 NOTE — Telephone Encounter (Signed)
Patient called in and says "I want you to let Dr. Posey ReaPlotnikov know I had another TIA episode on Tuesday. I couldn't drive to the hospital, so I called EMS. When they got here, they pretty much said the same thing Dr. Posey ReaPlotnikov said when I saw him in the office, that I was having TIA's. They did not take me to the hospital. I haven't had any more episodes since then, but wanted to let him know. I have an appointment tomorrow to get the dopplers, so I will just wait to hear about that." I asked is she having any symptoms today and what symptoms did she have on Tuesday, she says "no symptoms today or yesterday. Tuesday it was the same thing as before, tingling and numbness. Anytime I think about what happened, I get nervous and my BP shoots up, so just let Dr. Posey ReaPlotnikov know as soon as possible." I advised that if she has any more of these, that Dr. Posey ReaPlotnikov documented go to the ED, she verbalized understanding.

## 2018-04-21 NOTE — Telephone Encounter (Signed)
FYI

## 2018-04-22 ENCOUNTER — Emergency Department (HOSPITAL_COMMUNITY): Payer: Medicare Other

## 2018-04-22 ENCOUNTER — Other Ambulatory Visit (HOSPITAL_COMMUNITY): Payer: Medicare Other

## 2018-04-22 ENCOUNTER — Ambulatory Visit (HOSPITAL_COMMUNITY): Admission: RE | Admit: 2018-04-22 | Payer: Medicare Other | Source: Ambulatory Visit

## 2018-04-22 ENCOUNTER — Other Ambulatory Visit: Payer: Self-pay

## 2018-04-22 ENCOUNTER — Encounter (HOSPITAL_COMMUNITY): Payer: Self-pay | Admitting: Emergency Medicine

## 2018-04-22 ENCOUNTER — Inpatient Hospital Stay (HOSPITAL_COMMUNITY)
Admission: EM | Admit: 2018-04-22 | Discharge: 2018-04-24 | DRG: 066 | Disposition: A | Discharge status: Home / Self Care | Payer: Medicare Other | Attending: Internal Medicine | Admitting: Internal Medicine

## 2018-04-22 DIAGNOSIS — R200 Anesthesia of skin: Secondary | ICD-10-CM | POA: Diagnosis not present

## 2018-04-22 DIAGNOSIS — Z823 Family history of stroke: Secondary | ICD-10-CM

## 2018-04-22 DIAGNOSIS — Z79899 Other long term (current) drug therapy: Secondary | ICD-10-CM

## 2018-04-22 DIAGNOSIS — F419 Anxiety disorder, unspecified: Secondary | ICD-10-CM | POA: Diagnosis present

## 2018-04-22 DIAGNOSIS — I361 Nonrheumatic tricuspid (valve) insufficiency: Secondary | ICD-10-CM | POA: Diagnosis not present

## 2018-04-22 DIAGNOSIS — Z8249 Family history of ischemic heart disease and other diseases of the circulatory system: Secondary | ICD-10-CM

## 2018-04-22 DIAGNOSIS — I671 Cerebral aneurysm, nonruptured: Secondary | ICD-10-CM | POA: Diagnosis present

## 2018-04-22 DIAGNOSIS — Z7982 Long term (current) use of aspirin: Secondary | ICD-10-CM

## 2018-04-22 DIAGNOSIS — I700 Atherosclerosis of aorta: Secondary | ICD-10-CM | POA: Diagnosis not present

## 2018-04-22 DIAGNOSIS — Z888 Allergy status to other drugs, medicaments and biological substances status: Secondary | ICD-10-CM | POA: Diagnosis not present

## 2018-04-22 DIAGNOSIS — R011 Cardiac murmur, unspecified: Secondary | ICD-10-CM | POA: Diagnosis present

## 2018-04-22 DIAGNOSIS — R569 Unspecified convulsions: Secondary | ICD-10-CM | POA: Diagnosis present

## 2018-04-22 DIAGNOSIS — I728 Aneurysm of other specified arteries: Secondary | ICD-10-CM | POA: Diagnosis not present

## 2018-04-22 DIAGNOSIS — E785 Hyperlipidemia, unspecified: Secondary | ICD-10-CM | POA: Diagnosis not present

## 2018-04-22 DIAGNOSIS — I129 Hypertensive chronic kidney disease with stage 1 through stage 4 chronic kidney disease, or unspecified chronic kidney disease: Secondary | ICD-10-CM | POA: Diagnosis present

## 2018-04-22 DIAGNOSIS — F411 Generalized anxiety disorder: Secondary | ICD-10-CM | POA: Diagnosis present

## 2018-04-22 DIAGNOSIS — F329 Major depressive disorder, single episode, unspecified: Secondary | ICD-10-CM | POA: Diagnosis present

## 2018-04-22 DIAGNOSIS — M810 Age-related osteoporosis without current pathological fracture: Secondary | ICD-10-CM | POA: Diagnosis present

## 2018-04-22 DIAGNOSIS — Z8673 Personal history of transient ischemic attack (TIA), and cerebral infarction without residual deficits: Secondary | ICD-10-CM | POA: Diagnosis not present

## 2018-04-22 DIAGNOSIS — Z87891 Personal history of nicotine dependence: Secondary | ICD-10-CM | POA: Diagnosis not present

## 2018-04-22 DIAGNOSIS — I609 Nontraumatic subarachnoid hemorrhage, unspecified: Principal | ICD-10-CM

## 2018-04-22 DIAGNOSIS — G459 Transient cerebral ischemic attack, unspecified: Secondary | ICD-10-CM | POA: Diagnosis not present

## 2018-04-22 DIAGNOSIS — Z811 Family history of alcohol abuse and dependence: Secondary | ICD-10-CM

## 2018-04-22 DIAGNOSIS — E1122 Type 2 diabetes mellitus with diabetic chronic kidney disease: Secondary | ICD-10-CM | POA: Diagnosis not present

## 2018-04-22 DIAGNOSIS — N183 Chronic kidney disease, stage 3 (moderate): Secondary | ICD-10-CM | POA: Diagnosis not present

## 2018-04-22 DIAGNOSIS — J9811 Atelectasis: Secondary | ICD-10-CM | POA: Diagnosis not present

## 2018-04-22 DIAGNOSIS — Z9181 History of falling: Secondary | ICD-10-CM | POA: Diagnosis not present

## 2018-04-22 LAB — URINALYSIS, ROUTINE W REFLEX MICROSCOPIC
Bilirubin Urine: NEGATIVE
Glucose, UA: NEGATIVE mg/dL
Ketones, ur: 5 mg/dL — AB
Nitrite: NEGATIVE
Protein, ur: NEGATIVE mg/dL
Specific Gravity, Urine: 1.012 (ref 1.005–1.030)
WBC, UA: >50 WBC/hpf — ABNORMAL HIGH (ref 0–5)
pH: 6 (ref 5.0–8.0)

## 2018-04-22 LAB — CBC
HCT: 37.9 % (ref 36.0–46.0)
Hemoglobin: 13 g/dL (ref 12.0–15.0)
MCH: 28.6 pg (ref 26.0–34.0)
MCHC: 34.3 g/dL (ref 30.0–36.0)
MCV: 83.3 fL (ref 78.0–100.0)
Platelets: 359 10*3/uL (ref 150–400)
RBC: 4.55 MIL/uL (ref 3.87–5.11)
RDW: 15.1 % (ref 11.5–15.5)
WBC: 6.9 10*3/uL (ref 4.0–10.5)

## 2018-04-22 LAB — COMPREHENSIVE METABOLIC PANEL
ALT: 17 U/L (ref 0–44)
AST: 30 U/L (ref 15–41)
Albumin: 4.7 g/dL (ref 3.5–5.0)
Alkaline Phosphatase: 51 U/L (ref 38–126)
Anion gap: 8 (ref 5–15)
BUN: 24 mg/dL — ABNORMAL HIGH (ref 8–23)
CO2: 28 mmol/L (ref 22–32)
Calcium: 9.7 mg/dL (ref 8.9–10.3)
Chloride: 102 mmol/L (ref 98–111)
Creatinine, Ser: 1.12 mg/dL — ABNORMAL HIGH (ref 0.44–1.00)
GFR calc Af Amer: 50 mL/min — ABNORMAL LOW (ref >60)
GFR calc non Af Amer: 44 mL/min — ABNORMAL LOW (ref >60)
Glucose, Bld: 121 mg/dL — ABNORMAL HIGH (ref 70–99)
Potassium: 3.6 mmol/L (ref 3.5–5.1)
Sodium: 138 mmol/L (ref 135–145)
Total Bilirubin: 0.9 mg/dL (ref 0.3–1.2)
Total Protein: 8 g/dL (ref 6.5–8.1)

## 2018-04-22 LAB — RAPID URINE DRUG SCREEN, HOSP PERFORMED
Amphetamines: NOT DETECTED
Barbiturates: NOT DETECTED
Benzodiazepines: NOT DETECTED
Cocaine: NOT DETECTED
Opiates: NOT DETECTED
Tetrahydrocannabinol: NOT DETECTED

## 2018-04-22 LAB — DIFFERENTIAL
Basophils Absolute: 0 10*3/uL (ref 0.0–0.1)
Basophils Relative: 0 %
Eosinophils Absolute: 0.2 10*3/uL (ref 0.0–0.7)
Eosinophils Relative: 3 %
Lymphocytes Relative: 36 %
Lymphs Abs: 2.5 10*3/uL (ref 0.7–4.0)
Monocytes Absolute: 0.7 10*3/uL (ref 0.1–1.0)
Monocytes Relative: 10 %
Neutro Abs: 3.5 10*3/uL (ref 1.7–7.7)
Neutrophils Relative %: 51 %

## 2018-04-22 LAB — PROTIME-INR
INR: 0.92
Prothrombin Time: 12.3 seconds (ref 11.4–15.2)

## 2018-04-22 LAB — APTT: aPTT: 27 seconds (ref 24–36)

## 2018-04-22 LAB — ETHANOL: Alcohol, Ethyl (B): <10 mg/dL (ref <10)

## 2018-04-22 LAB — TROPONIN I: Troponin I: <0.03 ng/mL (ref <0.03)

## 2018-04-22 MED ORDER — ACETAMINOPHEN 325 MG PO TABS: 650.0000 mg | ORAL_TABLET | Freq: Four times a day (QID) | ORAL | Status: DC | PRN

## 2018-04-22 MED ORDER — LORAZEPAM 1 MG PO TABS
1.0000 mg | ORAL_TABLET | Freq: Once | ORAL | Status: AC | PRN
  Administered 2018-04-22: 1 mg via ORAL
  Filled 2018-04-22: qty 1

## 2018-04-22 MED ORDER — AMLODIPINE BESYLATE 5 MG PO TABS
10.0000 mg | ORAL_TABLET | Freq: Every day | ORAL | Status: DC
  Administered 2018-04-23 – 2018-04-24 (×2): 10 mg via ORAL
  Filled 2018-04-22 (×2): qty 2

## 2018-04-22 MED ORDER — LORAZEPAM 2 MG/ML IJ SOLN: 0.5000 mg | Freq: Once | INTRAMUSCULAR | Status: DC

## 2018-04-22 MED ORDER — SODIUM CHLORIDE 0.9 % IV SOLN: 250.0000 mL | INTRAVENOUS | Status: DC | PRN

## 2018-04-22 MED ORDER — SODIUM CHLORIDE 0.9% FLUSH
3.0000 mL | INTRAVENOUS | Status: DC | PRN
  Administered 2018-04-22: 3 mL via INTRAVENOUS
  Filled 2018-04-22: qty 3

## 2018-04-22 MED ORDER — SODIUM CHLORIDE 0.9% FLUSH
3.0000 mL | Freq: Two times a day (BID) | INTRAVENOUS | Status: DC
  Administered 2018-04-23 (×2): 3 mL via INTRAVENOUS

## 2018-04-22 MED ORDER — LEVETIRACETAM 500 MG PO TABS
500.0000 mg | ORAL_TABLET | Freq: Two times a day (BID) | ORAL | Status: DC
  Administered 2018-04-23 – 2018-04-24 (×3): 500 mg via ORAL
  Filled 2018-04-22 (×3): qty 1

## 2018-04-22 MED ORDER — ONDANSETRON HCL 4 MG PO TABS: 4.0000 mg | ORAL_TABLET | Freq: Four times a day (QID) | ORAL | Status: DC | PRN

## 2018-04-22 MED ORDER — SENNOSIDES-DOCUSATE SODIUM 8.6-50 MG PO TABS
2.0000 | ORAL_TABLET | Freq: Every day | ORAL | Status: DC
  Administered 2018-04-23: 2 via ORAL
  Filled 2018-04-22 (×2): qty 2

## 2018-04-22 MED ORDER — CENTRUM SILVER PO CHEW: 1.0000 | CHEWABLE_TABLET | ORAL | Status: DC

## 2018-04-22 MED ORDER — ONDANSETRON HCL 4 MG/2ML IJ SOLN: 4.0000 mg | Freq: Four times a day (QID) | INTRAMUSCULAR | Status: DC | PRN

## 2018-04-22 MED ORDER — SENNA-DOCUSATE SODIUM 8.6-50 MG PO TABS: 2.0000 | ORAL_TABLET | Freq: Every day | ORAL | Status: DC

## 2018-04-22 MED ORDER — PAROXETINE HCL 10 MG PO TABS
10.0000 mg | ORAL_TABLET | Freq: Every morning | ORAL | Status: DC
  Filled 2018-04-22: qty 1

## 2018-04-22 MED ORDER — ATENOLOL 50 MG PO TABS
50.0000 mg | ORAL_TABLET | Freq: Every day | ORAL | Status: DC
  Administered 2018-04-23 – 2018-04-24 (×2): 50 mg via ORAL
  Filled 2018-04-22 (×3): qty 1

## 2018-04-22 MED ORDER — ALBUTEROL SULFATE (2.5 MG/3ML) 0.083% IN NEBU: 2.5000 mg | INHALATION_SOLUTION | RESPIRATORY_TRACT | Status: DC | PRN

## 2018-04-22 MED ORDER — ACETAMINOPHEN 650 MG RE SUPP: 650.0000 mg | Freq: Four times a day (QID) | RECTAL | Status: DC | PRN

## 2018-04-22 MED ORDER — TRAZODONE HCL 50 MG PO TABS: 50.0000 mg | ORAL_TABLET | Freq: Every evening | ORAL | Status: DC | PRN

## 2018-04-22 MED ORDER — POLYETHYLENE GLYCOL 3350 17 G PO PACK: 17.0000 g | PACK | Freq: Every day | ORAL | Status: DC | PRN

## 2018-04-22 MED ORDER — ROSUVASTATIN CALCIUM 10 MG PO TABS
10.0000 mg | ORAL_TABLET | Freq: Every evening | ORAL | Status: DC
  Administered 2018-04-23: 10 mg via ORAL
  Filled 2018-04-22 (×3): qty 1

## 2018-04-22 MED ORDER — ADULT MULTIVITAMIN W/MINERALS CH
1.0000 | ORAL_TABLET | ORAL | Status: DC
  Administered 2018-04-22: 1 via ORAL
  Filled 2018-04-22: qty 1

## 2018-04-22 NOTE — ED Notes (Signed)
Pts

## 2018-04-22 NOTE — ED Notes (Signed)
[facility]

## 2018-04-22 NOTE — ED Provider Notes (Signed)
[facility]-EMERGENCY DEPT Provider Note   CSN: [id number] Arrival date & time: 04/22/18  2952     History   Chief Complaint Chief Complaint  Patient presents with  . arm numbness    HPI Vickie Wilson is a 84 y.o. female.  HPI  Pt was seen at 0950.  Per pt, c/o gradual onset and persistence of multiple intermittent episodes of left hand/arm/facial tingling/numbness since last week. Pt states the episodes last approximately 15-20 minutes before spontaneously resolving. Pt was evaluated by her PMD last week for her symptoms, told to increase ASA, and outpatient US carotids were scheduled. Pt states she has had "more episodes" over the past several days, with most recent episode of entire left hand "numbness" occurring this morning PTA. Denies visual changes, no focal motor weakness, no ataxia, no slurred speech, no facial droop. Denies CP/palpitations, no SOB/cough, no abd pain, no N/V/D, no neck or back pain, no fevers, no rash, no injury.     Past Medical History:  Diagnosis Date  . Allergy   . Anxiety   . Atrial mass    right  . Constipation   . Depression   . Diabetes mellitus   . Hyperlipidemia   . Hypertension   . Osteoporosis     Patient Active Problem List   Diagnosis Date Noted  . TIA (transient ischemic attack) 04/15/2018  . Hypokalemia 03/02/2018  . Other fatigue 02/13/2018  . Acute kidney injury (HCC) 02/12/2018  . Hyponatremia 02/12/2018  . Aortic atherosclerosis (HCC) 08/11/2017  . Carotid bruit 08/11/2017  . Urinary tract infection without hematuria 05/28/2017  . Vaginitis 05/28/2017  . HTN (hypertension) 04/04/2017  . Female bladder prolapse 04/01/2017  . Dysuria 04/22/2016  . Dry skin dermatitis 09/04/2015  . Cerumen impaction 09/04/2015  . Cataract 02/01/2015  . Burn of hand, left 06/22/2014  . Calf pain 07/14/2013  . Atrial mass 03/13/2013  . Tricuspid regurgitation   . Well adult exam 01/18/2013  . Nonspecific abnormal  electrocardiogram (ECG) (EKG) 01/18/2013  . Heart murmur 01/18/2013  . Superficial phlebitis 10/21/2012  . Zoster 10/21/2012  . Hives 02/23/2012  . Arthralgia 09/01/2011  . Foot pain, right 04/22/2011  . CHANGE IN BOWELS 06/17/2010  . BLADDER PROLAPSE 01/28/2010  . Cough 01/28/2010  . ABDOMINAL BLOATING 07/12/2009  . TOBACCO USE, QUIT 07/12/2009  . ECZEMA 10/31/2008  . Dyslipidemia 06/28/2008  . Constipation 04/20/2008  . BLEPHARITIS 11/23/2007  . Actinic keratosis 09/05/2007  . Diabetic on diet only (HCC) 08/01/2007  . Anxiety state 06/29/2007  . Depression 06/29/2007  . Hypertensive renal disease 06/29/2007  . ALLERGIC RHINITIS 06/29/2007  . OSTEOPOROSIS 06/29/2007    Past Surgical History:  Procedure Laterality Date  . DILATION AND CURETTAGE OF UTERUS  1987     OB History   None      Home Medications    Prior to Admission medications   Medication Sig Start Date End Date Taking? Authorizing Provider  amLODipine (NORVASC) 5 MG tablet TAKE 1 TABLET ONCE DAILY. 01/18/18   Plotnikov, Georgina Quint, MD  aspirin EC 81 MG tablet Take 2 tablets (162 mg total) by mouth daily. 04/15/18   Plotnikov, Georgina Quint, MD  atenolol (TENORMIN) 100 MG tablet TAKE (1/2) TABLET TWICE DAILY. 03/21/18   Plotnikov, Georgina Quint, MD  Multiple Vitamins-Minerals (CENTRUM SILVER) CHEW Chew 1 each by mouth 3 (three) times a week.     [provider]  PARoxetine (PAXIL) 10 MG tablet TAKE 1 TABLET IN THE  MORNING. 11/22/17   Plotnikov, Georgina Quint, MD  rosuvastatin (CRESTOR) 20 MG tablet Take 0.5 tablets (10 mg total) by mouth daily. 10/06/17   Plotnikov, Georgina Quint, MD    Family History Family History  Problem Relation Age of Onset  . Heart disease Mother   . Alcohol abuse Father   . Heart disease Father   . Hyperlipidemia Other   . Hypertension Other   . Stroke Other     Social History Social History   Tobacco Use  . Smoking status: Former Smoker    Packs/day: 1.00    Years: 20.00    Pack  years: 20.00    Last attempt to quit: 03/13/1998    Years since quitting: 20.1  . Smokeless tobacco: Never Used  Substance Use Topics  . Alcohol use: No  . Drug use: No     Allergies   Captopril   Review of Systems Review of Systems ROS: Statement: All systems negative except as marked or noted in the HPI; Constitutional: Negative for fever and chills. ; ; Eyes: Negative for eye pain, redness and discharge. ; ; ENMT: Negative for ear pain, hoarseness, nasal congestion, sinus pressure and sore throat. ; ; Cardiovascular: Negative for chest pain, palpitations, diaphoresis, dyspnea and peripheral edema. ; ; Respiratory: Negative for cough, wheezing and stridor. ; ; Gastrointestinal: Negative for nausea, vomiting, diarrhea, abdominal pain, blood in stool, hematemesis, jaundice and rectal bleeding. . ; ; Genitourinary: Negative for dysuria, flank pain and hematuria. ; ; Musculoskeletal: Negative for back pain and neck pain. Negative for swelling and trauma.; ; Skin: Negative for pruritus, rash, abrasions, blisters, bruising and skin lesion.; ; Neuro: +paresthesias. Negative for headache, lightheadedness and neck stiffness. Negative for weakness, altered level of consciousness, altered mental status, extremity weakness, involuntary movement, seizure and syncope.       Physical Exam Updated Vital Signs BP (!) 130/103 (BP Location: Left Arm) Comment: pt moving arm while BP taking   Pulse 73   Resp 18   SpO2 97%   Physical Exam 0955: Physical examination:  Nursing notes reviewed; Vital signs and O2 SAT reviewed;  Constitutional: Well developed, Well nourished, Well hydrated, In no acute distress; Head:  Normocephalic, atraumatic; Eyes: EOMI, PERRL, No scleral icterus; ENMT: Mouth and pharynx normal, Mucous membranes moist; Neck: Supple, Full range of motion, No lymphadenopathy; Cardiovascular: Regular rate and rhythm, No gallop; Respiratory: Breath sounds clear & equal bilaterally, No wheezes.   Speaking full sentences with ease, Normal respiratory effort/excursion; Chest: Nontender, Movement normal; Abdomen: Soft, Nontender, Nondistended, Normal bowel sounds; Genitourinary: No CVA tenderness; Extremities: Peripheral pulses normal, No tenderness, No edema, No calf edema or asymmetry.; Neuro: AA&Ox3, Major CN grossly intact. Speech clear.  No facial droop.  No nystagmus. Grips equal. Strength 5/5 equal bilat UE's and LE's. DTR 2/4 equal bilat UE's and LE's.  No gross sensory deficits.  Normal cerebellar testing bilat UE's (finger-nose) and LE's (heel-shin)..; Skin: Color normal, Warm, Dry.; Psych:  Anxious.   ED Treatments / Results  Labs (all labs ordered are listed, but only abnormal results are displayed)   EKG EKG Interpretation  Date/Time:  Friday April 22 2018 10:00:03 EDT Ventricular Rate:  60 PR Interval:    QRS Duration: 92 QT Interval:  427 QTC Calculation: 427 R Axis:   26 Text Interpretation:  Sinus rhythm Anterior infarct, old Baseline wander When compared with ECG of 02/12/2018 Premature ventricular complexes are no longer Present Confirmed by Samuel Jester [phone]) on 04/22/2018 10:16:22 AM  Radiology   Procedures Procedures (including critical care time)  Medications Ordered in ED Medications - No data to display   Initial Impression / Assessment and Plan / ED Course  I have reviewed the triage vital signs and the nursing notes.  Pertinent labs & imaging results that were available during my care of the patient were reviewed by me and considered in my medical decision making (see chart for details).  MDM Reviewed: previous chart, nursing note and vitals Reviewed previous: labs and ECG Interpretation: labs, ECG, x-ray and CT scan Total time providing critical care: 30-74 minutes. This excludes time spent performing separately reportable procedures and services. Consults: neurology and admitting MD    CRITICAL CARE Performed by: Samuel Jester Total critical care time: 35 minutes Critical care time was exclusive of separately billable procedures and treating other patients. Critical care was necessary to treat or prevent imminent or life-threatening deterioration. Critical care was time spent personally by me on the following activities: development of treatment plan with patient and/or surrogate as well as nursing, discussions with consultants, evaluation of patient's response to treatment, examination of patient, obtaining history from patient or surrogate, ordering and performing treatments and interventions, ordering and review of laboratory studies, ordering and review of radiographic studies, pulse oximetry and re-evaluation of patient's condition.   Results for orders placed or performed during the hospital encounter of 04/22/18  Ethanol  Result Value Ref Range   Alcohol, Ethyl (B) <10 <10 mg/dL  Protime-INR  Result Value Ref Range   Prothrombin Time 12.3 11.4 - 15.2 seconds   INR 0.92   APTT  Result Value Ref Range   aPTT 27 24 - 36 seconds  CBC  Result Value Ref Range   WBC 6.9 4.0 - 10.5 K/uL   RBC 4.55 3.87 - 5.11 MIL/uL   Hemoglobin 13.0 12.0 - 15.0 g/dL   HCT 16.1 09.6 - 04.5 %   MCV 83.3 78.0 - 100.0 fL   MCH 28.6 26.0 - 34.0 pg   MCHC 34.3 30.0 - 36.0 g/dL   RDW 40.9 81.1 - 91.4 %   Platelets 359 150 - 400 K/uL  Differential  Result Value Ref Range   Neutrophils Relative % 51 %   Neutro Abs 3.5 1.7 - 7.7 K/uL   Lymphocytes Relative 36 %   Lymphs Abs 2.5 0.7 - 4.0 K/uL   Monocytes Relative 10 %   Monocytes Absolute 0.7 0.1 - 1.0 K/uL   Eosinophils Relative 3 %   Eosinophils Absolute 0.2 0.0 - 0.7 K/uL   Basophils Relative 0 %   Basophils Absolute 0.0 0.0 - 0.1 K/uL  Comprehensive metabolic panel  Result Value Ref Range   Sodium 138 135 - 145 mmol/L   Potassium 3.6 3.5 - 5.1 mmol/L   Chloride 102 98 - 111 mmol/L   CO2 28 22 - 32 mmol/L   Glucose, Bld 121 (H) 70 - 99 mg/dL   BUN 24 (H) 8  - 23 mg/dL   Creatinine, Ser 7.82 (H) 0.44 - 1.00 mg/dL   Calcium 9.7 8.9 - 95.6 mg/dL   Total Protein 8.0 6.5 - 8.1 g/dL   Albumin 4.7 3.5 - 5.0 g/dL   AST 30 15 - 41 U/L   ALT 17 0 - 44 U/L   Alkaline Phosphatase 51 38 - 126 U/L   Total Bilirubin 0.9 0.3 - 1.2 mg/dL   GFR calc non Af Amer 44 (L) >60 mL/min   GFR calc Af Amer 50 (L) >  60 mL/min   Anion gap 8 5 - 15  Urine rapid drug screen (hosp performed)not at [facility]  Result Value Ref Range   Opiates NONE DETECTED NONE DETECTED   Cocaine NONE DETECTED NONE DETECTED   Benzodiazepines NONE DETECTED NONE DETECTED   Amphetamines NONE DETECTED NONE DETECTED   Tetrahydrocannabinol NONE DETECTED NONE DETECTED   Barbiturates NONE DETECTED NONE DETECTED  Urinalysis, Routine w reflex microscopic  Result Value Ref Range   Color, Urine YELLOW YELLOW   APPearance CLOUDY (A) CLEAR   Specific Gravity, Urine 1.012 1.005 - 1.030   pH 6.0 5.0 - 8.0   Glucose, UA NEGATIVE NEGATIVE mg/dL   Hgb urine dipstick SMALL (A) NEGATIVE   Bilirubin Urine NEGATIVE NEGATIVE   Ketones, ur 5 (A) NEGATIVE mg/dL   Protein, ur NEGATIVE NEGATIVE mg/dL   Nitrite NEGATIVE NEGATIVE   Leukocytes, UA LARGE (A) NEGATIVE   RBC / HPF 11-20 0 - 5 RBC/hpf   WBC, UA >50 (H) 0 - 5 WBC/hpf   Bacteria, UA RARE (A) NONE SEEN   Squamous Epithelial / LPF 6-10 0 - 5   WBC Clumps PRESENT    Mucus PRESENT   Troponin I  Result Value Ref Range   Troponin I <0.03 <0.03 ng/mL   Dg Chest 2 View  Result Date: 04/22/2018 CLINICAL DATA:  Intermittent LEFT arm numbness for 2 weeks, history hypertension, diabetes mellitus, anxiety, former smoker EXAM: CHEST - 2 VIEW COMPARISON:  02/12/2018 FINDINGS: Normal heart size, mediastinal contours, and pulmonary vascularity. Atherosclerotic calcification aorta. Lungs hyperinflated with minimal subsegmental atelectasis at LEFT costophrenic angle. Remaining lungs clear. No acute infiltrate, pleural effusion or pneumothorax. Bones appear demineralized  with old healed fracture of the posterolateral RIGHT sixth rib. IMPRESSION: Hyperinflated lungs with minimal subsegmental atelectasis at LEFT costophrenic angle. Electronically Signed   By: Ulyses Southward M.D.   On: 04/22/2018 11:07   Ct Head Wo Contrast Result Date: 04/22/2018 CLINICAL DATA:  Left arm numbness. EXAM: CT HEAD WITHOUT CONTRAST CT CERVICAL SPINE WITHOUT CONTRAST TECHNIQUE: Multidetector CT imaging of the head and cervical spine was performed following the standard protocol without intravenous contrast. Multiplanar CT image reconstructions of the cervical spine were also generated. COMPARISON:  None. FINDINGS: CT HEAD FINDINGS Brain: Mild diffuse cortical atrophy is noted. Mild chronic ischemic white matter disease is noted. No mass effect or midline shift is noted. Ventricular size is within normal limits. There is no evidence of mass lesion, hemorrhage or acute infarction. Vascular: No hyperdense vessel or unexpected calcification. Skull: Normal. Negative for fracture or focal lesion. Sinuses/Orbits: No acute finding. Other: None. CT CERVICAL SPINE FINDINGS Alignment: Grade 1 anterolisthesis of C3-4 C4-5 is noted secondary to posterior facet joint hypertrophy. Skull base and vertebrae: No acute fracture. No primary bone lesion or focal pathologic process. Soft tissues and spinal canal: No prevertebral fluid or swelling. No visible canal hematoma. Disc levels: Severe degenerative disc disease is noted at C5-6 and C6-7 with anterior osteophyte formation. Mild degenerative disc disease is noted at C4-5 and C7-T1. Upper chest: Negative. Other: Degenerative changes seen involving posterior facet joints bilaterally, right greater than left. IMPRESSION: Mild diffuse cortical atrophy. Mild chronic ischemic white matter disease. No acute intracranial abnormality seen. Multilevel degenerative disc disease is noted in the cervical spine. No fracture or other acute abnormality is noted. Electronically Signed    By: Lupita Raider, M.D.   On: 04/22/2018 11:09   Ct Cervical Spine Wo Contrast Result Date: 04/22/2018 CLINICAL DATA:  Left  arm numbness. EXAM: CT HEAD WITHOUT CONTRAST CT CERVICAL SPINE WITHOUT CONTRAST TECHNIQUE: Multidetector CT imaging of the head and cervical spine was performed following the standard protocol without intravenous contrast. Multiplanar CT image reconstructions of the cervical spine were also generated. COMPARISON:  None. FINDINGS: CT HEAD FINDINGS Brain: Mild diffuse cortical atrophy is noted. Mild chronic ischemic white matter disease is noted. No mass effect or midline shift is noted. Ventricular size is within normal limits. There is no evidence of mass lesion, hemorrhage or acute infarction. Vascular: No hyperdense vessel or unexpected calcification. Skull: Normal. Negative for fracture or focal lesion. Sinuses/Orbits: No acute finding. Other: None. CT CERVICAL SPINE FINDINGS Alignment: Grade 1 anterolisthesis of C3-4 C4-5 is noted secondary to posterior facet joint hypertrophy. Skull base and vertebrae: No acute fracture. No primary bone lesion or focal pathologic process. Soft tissues and spinal canal: No prevertebral fluid or swelling. No visible canal hematoma. Disc levels: Severe degenerative disc disease is noted at C5-6 and C6-7 with anterior osteophyte formation. Mild degenerative disc disease is noted at C4-5 and C7-T1. Upper chest: Negative. Other: Degenerative changes seen involving posterior facet joints bilaterally, right greater than left. IMPRESSION: Mild diffuse cortical atrophy. Mild chronic ischemic white matter disease. No acute intracranial abnormality seen. Multilevel degenerative disc disease is noted in the cervical spine. No fracture or other acute abnormality is noted. Electronically Signed   By: Lupita Raider, M.D.   On: 04/22/2018 11:09    Mr Brain Wo Contrast (neuro Protocol) Result Date: 04/22/2018 CLINICAL DATA:  Numbness or tingling, paresthesia.  Intermittent left arm numbness for 2 weeks EXAM: MRI HEAD WITHOUT CONTRAST TECHNIQUE: Multiplanar, multiecho pulse sequences of the brain and surrounding structures were obtained without intravenous contrast. COMPARISON:  CT from earlier today FINDINGS: Brain: Small focus of subarachnoid FLAIR hyperintensity and gradient hypointensity along the precentral gyrus on the right, this area is high-density by CT. No other site of hemorrhage seen, either acute or chronic. There is mild for age chronic small vessel ischemia in the cerebral white matter. Mild cerebral volume loss. Clustered appearance of gyri at the left vertex, nonspecific and likely noncontributory. No underlying mass is seen and there is no extrinsic mass effect by collection. No hydrocephalus. Vascular: Major flow voids are preserved Skull and upper cervical spine: No evidence of marrow lesion. Diffuse cervical facet spurring with mild C3-4 and C4-5 anterolisthesis. Sinuses/Orbits: Left cataract resection. Other: These results were called by telephone at the time of interpretation on 04/22/2018 at 2:10 pm to Dr. Samuel Jester , who verbally acknowledged these results. IMPRESSION: 1. Small volume subarachnoid hemorrhage along the high right frontal convexity. Trauma, coagulopathy, or vasculopathy/amyloid are primary considerations in this location. There is normal superior sagittal sinus flow voids. No acute or remote hemorrhage seen elsewhere. 2. Mild atrophy and chronic small vessel ischemia for age. Electronically Signed   By: Marnee Spring M.D.   On: 04/22/2018 14:14      1210:  Pt anxious. Pt's daughter states pt has been "calling us every time she has these symptoms and we go running over there." Daughter and pt concerned regarding TIA/CVA. CT-H reassuring; MRI brain pending.  T/C returned from [facility] Dr. Mariea Clonts, case discussed, including:  HPI, pertinent PM/SHx, VS/PE, dx testing, ED course and treatment:  Agreeable to come to ED for  evaluation.   1415:  MRI as above. T/C returned from [facility] Dr. Amada Jupiter, case discussed, including:  HPI, pertinent PM/SHx, VS/PE, dx testing, ED course and treatment:  Agrees [facility] for further testing and Neuro MD consult, symptoms have been present/intermittent x2 weeks so ICU level care not needed at this time. T/C returned from [facility] Dr. Mariea ClontsEmokpae: updated regarding MRI and d/w Neuro MD, [facility].    Final Clinical Impressions(s) / ED Diagnoses   Final diagnoses:  None    ED Discharge Orders    None       Samuel JesterMcManus, Arvie Villarruel, DO 04/23/18 [id number]

## 2018-04-22 NOTE — H&P (Addendum)
Patient Demographics:    Vickie Wilson, is a 84 y.o. female  MRN: [id number]   DOB - 10/21/1930  Admit Date - 04/22/2018  Outpatient Primary MD for the patient is Plotnikov, Georgina Quint, MD   Assessment & Plan:    Principal Problem:   SAH (subarachnoid hemorrhage) (HCC) Active Problems:   Anxiety state  Brain MRI IMPRESSION: 1. Small volume subarachnoid hemorrhage along the high right frontal convexity. Trauma, coagulopathy, or vasculopathy/amyloid are primary considerations in this location. There is normal superior sagittal sinus flow voids. No acute or remote hemorrhage seen elsewhere. 2. Mild atrophy and chronic small vessel ischemia for age.   Plan:-  1) small volume subarachnoid hemorrhage--- patient denies any head injury, she is not on any anticoagulants, she is not on any mind altering substances, denies any significant alcohol intake,  clinically neurologically patient appears stable at this time ED provider discussed this case with on-call neurologist Dr. Amada Jupiter who recommended transfer to [facility] for further neurology evaluation.  Unlikely that patient would need any neurosurgical intervention, please note that CT head did not pick up the subarachnoid hemorrhage,  it was only picked up on the brain MRI .  Neurochecks every 4 hours now, admit to stepdown unit .  Neurosurgical consult from [place] [facility] requested, page sent out to Mr. Cindra Presume  2)HTN--- stable, given subarachnoid hemorrhage we will try and avoid hypotension at this time, restart amlodipine 10 mg daily and atenolol 50 mg daily, will rely on neurology service to give [place] target BP parameters  3)Anxiety/Depression--patient is anxious even at baseline, continue Paxil, however avoid mind altering agents including  benzos in order to avoid confusing the picture as patient has a brain bleed will need to be able to do accurate neuro checks  4)Social/Ethics--- plan of care reviewed with patient and daughter Ernst Bowler at bedside, CODE STATUS and advanced directives also reviewed ,  patient is a full code  Small SAH on brain MRI, patient is clinically and neurologically stable, ED provider discussed case with on-call neurologist Dr. Kirstie Peri, I discussed this case with on-call neurosurgical PA Mr. Cindra Presume Official neurology and official neurosurgical consults are pending   With History of - Reviewed by me  Past Medical History:  Diagnosis Date  . Allergy   . Anxiety   . Atrial mass    right  . Constipation   . Depression   . Diabetes mellitus   . Hyperlipidemia   . Hypertension   . Osteoporosis       Past Surgical History:  Procedure Laterality Date  . DILATION AND CURETTAGE OF UTERUS  1987      Chief Complaint  Patient presents with  . arm numbness      HPI:    Vickie Wilson  is a 84 y.o. female with past medical history history relevant for history of TIAs, dyslipidemia, hypertension, anxiety and depressive disorder since to the ED with complaints  of left arm as well as facial numbness on and off for over a week now, patient was seen by PCP Dr. Posey Rea on 04/15/2018.  Symptoms resolved after that visit but then did be more persistent lately patient had EMS to the hospital recently symptoms improved when EMS arrived so she was not transported to the ED but today symptoms became more persistent so patient came to the ED  In ED--- CT head was unremarkable, however MRI brain showed small volume subarachnoid hemorrhage, ED provider discussed this case with on-call neurologist Dr. Onalee Hua, who advised transfer to [facility] for further neurology evaluation.   Patient granddaughter and  daughter at bedside, additional history obtained both of them, patient  denies any history of significant falls or head injury recently.  Patient is not on any anticoagulants, denies excessive NSAID use, she was taking aspirin 162 mg daily  Patient denies significant alcohol intake or use of any mind altering substances  Small SAH on brain MRI, patient is clinically and neurologically stable, ED provider discussed case with on-call neurologist Dr. Kirstie Peri, I discussed this case with on-call neurosurgical PA Mr. Cindra Presume Official neurology and official neurosurgical consults are pending     Review of systems:    In addition to the HPI above,   A full Review of  Systems was done, all other systems reviewed are negative except as noted above in HPI , .    Social History:  Reviewed by me    Social History   Tobacco Use  . Smoking status: Former Smoker    Packs/day: 1.00    Years: 20.00    Pack years: 20.00    Last attempt to quit: 03/13/1998    Years since quitting: 20.1  . Smokeless tobacco: Never Used  Substance Use Topics  . Alcohol use: No     Family History :  Reviewed by me    Family History  Problem Relation Age of Onset  . Heart disease Mother   . Alcohol abuse Father   . Heart disease Father   . Hyperlipidemia Other   . Hypertension Other   . Stroke Other     Home Medications:   Prior to Admission medications   Medication Sig Start Date End Date Taking? Authorizing Provider  amLODipine (NORVASC) 5 MG tablet TAKE 1 TABLET ONCE DAILY. 01/18/18  Yes Plotnikov, Georgina Quint, MD  aspirin EC 81 MG tablet Take 2 tablets (162 mg total) by mouth daily. 04/15/18  Yes Plotnikov, Georgina Quint, MD  atenolol (TENORMIN) 100 MG tablet TAKE (1/2) TABLET TWICE DAILY. Patient taking differently: Take 50 mg by mouth 2 (two) times daily.  03/21/18  Yes Plotnikov, Georgina Quint, MD  Multiple Vitamins-Minerals (CENTRUM SILVER) CHEW Chew 1 each by mouth 3 (three) times a week.    Yes [provider]  PARoxetine (PAXIL) 10 MG tablet  TAKE 1 TABLET IN THE MORNING. Patient taking differently: Take 5 mg by mouth daily.  11/22/17  Yes Plotnikov, Georgina Quint, MD  rosuvastatin (CRESTOR) 20 MG tablet Take 0.5 tablets (10 mg total) by mouth daily. Patient taking differently: Take 10 mg by mouth every evening.  10/06/17  Yes Plotnikov, Georgina Quint, MD  sennosides-docusate sodium (SENOKOT-S) 8.6-50 MG tablet Take 2 tablets by mouth at bedtime.   Yes [provider]     Allergies:     Allergies  Allergen Reactions  . Captopril     REACTION: cough     Physical Exam:  Vitals  Blood pressure 127/69, pulse (!) 56, resp. rate 17, SpO2 99 %.  Physical Examination: General appearance - alert, well appearing, and in no distress and Mental status - alert, oriented to person, place, and time,  Eyes - sclera anicteric Neck - supple, no JVD elevation , Chest - clear  to auscultation bilaterally, symmetrical air movement,  Heart - S1 and S2 normal,  Abdomen - soft, nontender, nondistended, no masses or organomegaly Neurological - screening mental status exam normal, neck supple without rigidity, cranial nerves II through XII intact, DTR's normal and symmetric, gait is steady I cannot elicit any significant acute neuro deficits at this time Extremities - no pedal edema noted, intact peripheral pulses * Skin - warm, dry     Data Review:    CBC Recent Labs  Lab 04/22/18 1007  WBC 6.9  HGB 13.0  HCT 37.9  PLT 359  MCV 83.3  MCH 28.6  MCHC 34.3  RDW 15.1  LYMPHSABS 2.5  MONOABS 0.7  EOSABS 0.2  BASOSABS 0.0   ------------------------------------------------------------------------------------------------------------------  Chemistries  Recent Labs  Lab 04/22/18 1007  NA 138  K 3.6  CL 102  CO2 28  GLUCOSE 121*  BUN 24*  CREATININE 1.12*  CALCIUM 9.7  AST 30  ALT 17  ALKPHOS 51  BILITOT 0.9    ------------------------------------------------------------------------------------------------------------------ estimated creatinine clearance is 27.7 mL/min (A) (by C-G formula based on SCr of 1.12 mg/dL (H)). ------------------------------------------------------------------------------------------------------------------ No results for input(s): TSH, T4TOTAL, T3FREE, THYROIDAB in the last 72 hours.  Invalid input(s): FREET3   Coagulation profile Recent Labs  Lab 04/22/18 1007  INR 0.92   ------------------------------------------------------------------------------------------------------------------- No results for input(s): DDIMER in the last 72 hours. -------------------------------------------------------------------------------------------------------------------  Cardiac Enzymes Recent Labs  Lab 04/22/18 1007  TROPONINI <0.03   ------------------------------------------------------------------------------------------------------------------ No results found for: BNP   ---------------------------------------------------------------------------------------------------------------  Urinalysis    Component Value Date/Time   COLORURINE YELLOW 04/22/2018 1101   APPEARANCEUR CLOUDY (A) 04/22/2018 1101   LABSPEC 1.012 04/22/2018 1101   PHURINE 6.0 04/22/2018 1101   GLUCOSEU NEGATIVE 04/22/2018 1101   GLUCOSEU NEGATIVE 06/02/2017 0748   HGBUR SMALL (A) 04/22/2018 1101   BILIRUBINUR NEGATIVE 04/22/2018 1101   BILIRUBINUR neg 04/22/2016 1625   KETONESUR 5 (A) 04/22/2018 1101   PROTEINUR NEGATIVE 04/22/2018 1101   UROBILINOGEN 0.2 06/02/2017 0748   NITRITE NEGATIVE 04/22/2018 1101   LEUKOCYTESUR LARGE (A) 04/22/2018 1101    ----------------------------------------------------------------------------------------------------------------   Imaging Results:    Dg Chest 2 View  Result Date: 04/22/2018 CLINICAL DATA:  Intermittent LEFT arm numbness for 2 weeks,  history hypertension, diabetes mellitus, anxiety, former smoker EXAM: CHEST - 2 VIEW COMPARISON:  02/12/2018 FINDINGS: Normal heart size, mediastinal contours, and pulmonary vascularity. Atherosclerotic calcification aorta. Lungs hyperinflated with minimal subsegmental atelectasis at LEFT costophrenic angle. Remaining lungs clear. No acute infiltrate, pleural effusion or pneumothorax. Bones appear demineralized with old healed fracture of the posterolateral RIGHT sixth rib. IMPRESSION: Hyperinflated lungs with minimal subsegmental atelectasis at LEFT costophrenic angle. Electronically Signed   By: Ulyses Southward M.D.   On: 04/22/2018 11:07   Ct Head Wo Contrast  Result Date: 04/22/2018 CLINICAL DATA:  Left arm numbness. EXAM: CT HEAD WITHOUT CONTRAST CT CERVICAL SPINE WITHOUT CONTRAST TECHNIQUE: Multidetector CT imaging of the head and cervical spine was performed following the standard protocol without intravenous contrast. Multiplanar CT image reconstructions of the cervical spine were also generated. COMPARISON:  None. FINDINGS: CT HEAD FINDINGS Brain: Mild diffuse cortical atrophy is noted. Mild chronic ischemic white  matter disease is noted. No mass effect or midline shift is noted. Ventricular size is within normal limits. There is no evidence of mass lesion, hemorrhage or acute infarction. Vascular: No hyperdense vessel or unexpected calcification. Skull: Normal. Negative for fracture or focal lesion. Sinuses/Orbits: No acute finding. Other: None. CT CERVICAL SPINE FINDINGS Alignment: Grade 1 anterolisthesis of C3-4 C4-5 is noted secondary to posterior facet joint hypertrophy. Skull base and vertebrae: No acute fracture. No primary bone lesion or focal pathologic process. Soft tissues and spinal canal: No prevertebral fluid or swelling. No visible canal hematoma. Disc levels: Severe degenerative disc disease is noted at C5-6 and C6-7 with anterior osteophyte formation. Mild degenerative disc disease is noted  at C4-5 and C7-T1. Upper chest: Negative. Other: Degenerative changes seen involving posterior facet joints bilaterally, right greater than left. IMPRESSION: Mild diffuse cortical atrophy. Mild chronic ischemic white matter disease. No acute intracranial abnormality seen. Multilevel degenerative disc disease is noted in the cervical spine. No fracture or other acute abnormality is noted. Electronically Signed   By: Lupita Raider, M.D.   On: 04/22/2018 11:09   Ct Cervical Spine Wo Contrast  Result Date: 04/22/2018 CLINICAL DATA:  Left arm numbness. EXAM: CT HEAD WITHOUT CONTRAST CT CERVICAL SPINE WITHOUT CONTRAST TECHNIQUE: Multidetector CT imaging of the head and cervical spine was performed following the standard protocol without intravenous contrast. Multiplanar CT image reconstructions of the cervical spine were also generated. COMPARISON:  None. FINDINGS: CT HEAD FINDINGS Brain: Mild diffuse cortical atrophy is noted. Mild chronic ischemic white matter disease is noted. No mass effect or midline shift is noted. Ventricular size is within normal limits. There is no evidence of mass lesion, hemorrhage or acute infarction. Vascular: No hyperdense vessel or unexpected calcification. Skull: Normal. Negative for fracture or focal lesion. Sinuses/Orbits: No acute finding. Other: None. CT CERVICAL SPINE FINDINGS Alignment: Grade 1 anterolisthesis of C3-4 C4-5 is noted secondary to posterior facet joint hypertrophy. Skull base and vertebrae: No acute fracture. No primary bone lesion or focal pathologic process. Soft tissues and spinal canal: No prevertebral fluid or swelling. No visible canal hematoma. Disc levels: Severe degenerative disc disease is noted at C5-6 and C6-7 with anterior osteophyte formation. Mild degenerative disc disease is noted at C4-5 and C7-T1. Upper chest: Negative. Other: Degenerative changes seen involving posterior facet joints bilaterally, right greater than left. IMPRESSION: Mild diffuse  cortical atrophy. Mild chronic ischemic white matter disease. No acute intracranial abnormality seen. Multilevel degenerative disc disease is noted in the cervical spine. No fracture or other acute abnormality is noted. Electronically Signed   By: Lupita Raider, M.D.   On: 04/22/2018 11:09   Mr Brain Wo Contrast (neuro Protocol)  Result Date: 04/22/2018 CLINICAL DATA:  Numbness or tingling, paresthesia. Intermittent left arm numbness for 2 weeks EXAM: MRI HEAD WITHOUT CONTRAST TECHNIQUE: Multiplanar, multiecho pulse sequences of the brain and surrounding structures were obtained without intravenous contrast. COMPARISON:  CT from earlier today FINDINGS: Brain: Small focus of subarachnoid FLAIR hyperintensity and gradient hypointensity along the precentral gyrus on the right, this area is high-density by CT. No other site of hemorrhage seen, either acute or chronic. There is mild for age chronic small vessel ischemia in the cerebral white matter. Mild cerebral volume loss. Clustered appearance of gyri at the left vertex, nonspecific and likely noncontributory. No underlying mass is seen and there is no extrinsic mass effect by collection. No hydrocephalus. Vascular: Major flow voids are preserved Skull and upper cervical spine: No  evidence of marrow lesion. Diffuse cervical facet spurring with mild C3-4 and C4-5 anterolisthesis. Sinuses/Orbits: Left cataract resection. Other: These results were called by telephone at the time of interpretation on 04/22/2018 at 2:10 pm to Dr. Samuel Jester , who verbally acknowledged these results. IMPRESSION: 1. Small volume subarachnoid hemorrhage along the high right frontal convexity. Trauma, coagulopathy, or vasculopathy/amyloid are primary considerations in this location. There is normal superior sagittal sinus flow voids. No acute or remote hemorrhage seen elsewhere. 2. Mild atrophy and chronic small vessel ischemia for age. Electronically Signed   By: Marnee Spring M.D.    On: 04/22/2018 14:14    Radiological Exams on Admission: Dg Chest 2 View  Result Date: 04/22/2018 CLINICAL DATA:  Intermittent LEFT arm numbness for 2 weeks, history hypertension, diabetes mellitus, anxiety, former smoker EXAM: CHEST - 2 VIEW COMPARISON:  02/12/2018 FINDINGS: Normal heart size, mediastinal contours, and pulmonary vascularity. Atherosclerotic calcification aorta. Lungs hyperinflated with minimal subsegmental atelectasis at LEFT costophrenic angle. Remaining lungs clear. No acute infiltrate, pleural effusion or pneumothorax. Bones appear demineralized with old healed fracture of the posterolateral RIGHT sixth rib. IMPRESSION: Hyperinflated lungs with minimal subsegmental atelectasis at LEFT costophrenic angle. Electronically Signed   By: Ulyses Southward M.D.   On: 04/22/2018 11:07   Ct Head Wo Contrast  Result Date: 04/22/2018 CLINICAL DATA:  Left arm numbness. EXAM: CT HEAD WITHOUT CONTRAST CT CERVICAL SPINE WITHOUT CONTRAST TECHNIQUE: Multidetector CT imaging of the head and cervical spine was performed following the standard protocol without intravenous contrast. Multiplanar CT image reconstructions of the cervical spine were also generated. COMPARISON:  None. FINDINGS: CT HEAD FINDINGS Brain: Mild diffuse cortical atrophy is noted. Mild chronic ischemic white matter disease is noted. No mass effect or midline shift is noted. Ventricular size is within normal limits. There is no evidence of mass lesion, hemorrhage or acute infarction. Vascular: No hyperdense vessel or unexpected calcification. Skull: Normal. Negative for fracture or focal lesion. Sinuses/Orbits: No acute finding. Other: None. CT CERVICAL SPINE FINDINGS Alignment: Grade 1 anterolisthesis of C3-4 C4-5 is noted secondary to posterior facet joint hypertrophy. Skull base and vertebrae: No acute fracture. No primary bone lesion or focal pathologic process. Soft tissues and spinal canal: No prevertebral fluid or swelling. No visible  canal hematoma. Disc levels: Severe degenerative disc disease is noted at C5-6 and C6-7 with anterior osteophyte formation. Mild degenerative disc disease is noted at C4-5 and C7-T1. Upper chest: Negative. Other: Degenerative changes seen involving posterior facet joints bilaterally, right greater than left. IMPRESSION: Mild diffuse cortical atrophy. Mild chronic ischemic white matter disease. No acute intracranial abnormality seen. Multilevel degenerative disc disease is noted in the cervical spine. No fracture or other acute abnormality is noted. Electronically Signed   By: Lupita Raider, M.D.   On: 04/22/2018 11:09   Ct Cervical Spine Wo Contrast  Result Date: 04/22/2018 CLINICAL DATA:  Left arm numbness. EXAM: CT HEAD WITHOUT CONTRAST CT CERVICAL SPINE WITHOUT CONTRAST TECHNIQUE: Multidetector CT imaging of the head and cervical spine was performed following the standard protocol without intravenous contrast. Multiplanar CT image reconstructions of the cervical spine were also generated. COMPARISON:  None. FINDINGS: CT HEAD FINDINGS Brain: Mild diffuse cortical atrophy is noted. Mild chronic ischemic white matter disease is noted. No mass effect or midline shift is noted. Ventricular size is within normal limits. There is no evidence of mass lesion, hemorrhage or acute infarction. Vascular: No hyperdense vessel or unexpected calcification. Skull: Normal. Negative for fracture or focal lesion.  Sinuses/Orbits: No acute finding. Other: None. CT CERVICAL SPINE FINDINGS Alignment: Grade 1 anterolisthesis of C3-4 C4-5 is noted secondary to posterior facet joint hypertrophy. Skull base and vertebrae: No acute fracture. No primary bone lesion or focal pathologic process. Soft tissues and spinal canal: No prevertebral fluid or swelling. No visible canal hematoma. Disc levels: Severe degenerative disc disease is noted at C5-6 and C6-7 with anterior osteophyte formation. Mild degenerative disc disease is noted at C4-5  and C7-T1. Upper chest: Negative. Other: Degenerative changes seen involving posterior facet joints bilaterally, right greater than left. IMPRESSION: Mild diffuse cortical atrophy. Mild chronic ischemic white matter disease. No acute intracranial abnormality seen. Multilevel degenerative disc disease is noted in the cervical spine. No fracture or other acute abnormality is noted. Electronically Signed   By: Lupita RaiderJames  Green Jr, M.D.   On: 04/22/2018 11:09   Mr Brain Wo Contrast (neuro Protocol)  Result Date: 04/22/2018 CLINICAL DATA:  Numbness or tingling, paresthesia. Intermittent left arm numbness for 2 weeks EXAM: MRI HEAD WITHOUT CONTRAST TECHNIQUE: Multiplanar, multiecho pulse sequences of the brain and surrounding structures were obtained without intravenous contrast. COMPARISON:  CT from earlier today FINDINGS: Brain: Small focus of subarachnoid FLAIR hyperintensity and gradient hypointensity along the precentral gyrus on the right, this area is high-density by CT. No other site of hemorrhage seen, either acute or chronic. There is mild for age chronic small vessel ischemia in the cerebral white matter. Mild cerebral volume loss. Clustered appearance of gyri at the left vertex, nonspecific and likely noncontributory. No underlying mass is seen and there is no extrinsic mass effect by collection. No hydrocephalus. Vascular: Major flow voids are preserved Skull and upper cervical spine: No evidence of marrow lesion. Diffuse cervical facet spurring with mild C3-4 and C4-5 anterolisthesis. Sinuses/Orbits: Left cataract resection. Other: These results were called by telephone at the time of interpretation on 04/22/2018 at 2:10 pm to Dr. Samuel JesterKATHLEEN MCMANUS , who verbally acknowledged these results. IMPRESSION: 1. Small volume subarachnoid hemorrhage along the high right frontal convexity. Trauma, coagulopathy, or vasculopathy/amyloid are primary considerations in this location. There is normal superior sagittal sinus  flow voids. No acute or remote hemorrhage seen elsewhere. 2. Mild atrophy and chronic small vessel ischemia for age. Electronically Signed   By: Marnee SpringJonathon  Watts M.D.   On: 04/22/2018 14:14    DVT Prophylaxis -SCD  AM Labs Ordered, also please review Full Orders  Family Communication: Admission, patients condition and plan of care including tests being ordered have been discussed with the patient and daughter Ernst BowlerSuzzane who indicate understanding and agree with the plan   Code Status - Full Code  Likely DC to  Home   Condition   stable  Shon Haleourage Wesly Whisenant M.D on 04/22/2018 at 7:01 PM  Go to [email or web address] - password [email or web address] for contact info  [facility] - Office  [phone]

## 2018-04-22 NOTE — ED Notes (Signed)
[facility] has been called and will send transport ASAP. 

## 2018-04-22 NOTE — ED Notes (Signed)
Pt updated on plan of care and MRI arriving in around .

## 2018-04-22 NOTE — Consult Note (Addendum)
Chief Complaint   Chief Complaint  Patient presents with  . arm numbness    HPI   HPI: DAJON ROWE is a 84 y.o. female who presented to ER earlier today due to recurrent transient left upper extremity numbness. Last Tuesday am, she had acute onset left hand numbness. Symptoms self resolved after 15 minutes. Symptoms reoccurred several hours later. She went to her PCP the following Friday and states he told her she didn't have a stroke, but should symptoms occur again to go to ER. Her ASA 1m was also increased from 1 tab to 2 tabs daily. Tuesday around midnight, symptoms reoccurred. She called EMS and apparently by the time they arrived, symptoms resolved so she elected not to go to hospital. Earlier today, she had a fourth episode. This episode was the most severe and radiated up to her shoulder. All other episodes were isolated to the left hand. On average, symptoms lasted 15-20 minutes when they occured. She is unable to determine any pattern/causality. With two of the episodes, she had tingling along left chin, but denies facial droop, slurring speech, BLE symptoms, RUE symptoms. She cannot recall any trauma with the last couple weeks, although does remember falling and striking head 6-7 months ago. Is a former smoker 30 years ago, 15 pack year history. No family hx of aneurysm or vascular malformation. Extensive family cardiac history.  Patient Active Problem List   Diagnosis Date Noted  . SAH (subarachnoid hemorrhage) ([facility] 04/22/2018  . TIA (transient ischemic attack) 04/15/2018  . Hypokalemia 03/02/2018  . Other fatigue 02/13/2018  . Acute kidney injury ([facility] 02/12/2018  . Hyponatremia 02/12/2018  . Aortic atherosclerosis ([facility] 08/11/2017  . Carotid bruit 08/11/2017  . Urinary tract infection without hematuria 05/28/2017  . Vaginitis 05/28/2017  . HTN (hypertension) 04/04/2017  . Female bladder prolapse 04/01/2017  . Dysuria 04/22/2016  . Dry skin dermatitis 09/04/2015  .  Cerumen impaction 09/04/2015  . Cataract 02/01/2015  . Burn of hand, left 06/22/2014  . Calf pain 07/14/2013  . Atrial mass 03/13/2013  . Tricuspid regurgitation   . Well adult exam 01/18/2013  . Nonspecific abnormal electrocardiogram (ECG) (EKG) 01/18/2013  . Heart murmur 01/18/2013  . Superficial phlebitis 10/21/2012  . Zoster 10/21/2012  . Hives 02/23/2012  . Arthralgia 09/01/2011  . Foot pain, right 04/22/2011  . CHANGE IN BOWELS 06/17/2010  . BLADDER PROLAPSE 01/28/2010  . Cough 01/28/2010  . ABDOMINAL BLOATING 07/12/2009  . TOBACCO USE, QUIT 07/12/2009  . ECZEMA 10/31/2008  . Dyslipidemia 06/28/2008  . Constipation 04/20/2008  . BLEPHARITIS 11/23/2007  . Actinic keratosis 09/05/2007  . Diabetic on diet only ([place] 08/01/2007  . Anxiety state 06/29/2007  . Depression 06/29/2007  . Hypertensive renal disease 06/29/2007  . ALLERGIC RHINITIS 06/29/2007  . OSTEOPOROSIS 06/29/2007    PMH: Past Medical History:  Diagnosis Date  . Allergy   . Anxiety   . Atrial mass    right  . Constipation   . Depression   . Diabetes mellitus   . Hyperlipidemia   . Hypertension   . Osteoporosis     PSH: Past Surgical History:  Procedure Laterality Date  . DILATION AND CURETTAGE OF UTERUS  1987    Medications Prior to Admission  Medication Sig Dispense Refill Last Dose  . amLODipine (NORVASC) 5 MG tablet TAKE 1 TABLET ONCE DAILY. 90 tablet 3 04/22/2018 at Unknown time  . aspirin EC 81 MG tablet Take 2 tablets (162 mg total) by mouth daily. 300 tablet  3 04/22/2018 at Unknown time  . atenolol (TENORMIN) 100 MG tablet TAKE (1/2) TABLET TWICE DAILY. (Patient taking differently: Take 50 mg by mouth 2 (two) times daily. ) 30 tablet 3 04/22/2018 at 0700  . Multiple Vitamins-Minerals (CENTRUM SILVER) CHEW Chew 1 each by mouth 3 (three) times a week.    04/22/2018 at Unknown time  . PARoxetine (PAXIL) 10 MG tablet TAKE 1 TABLET IN THE MORNING. (Patient taking differently: Take 5 mg by mouth  daily. ) 90 tablet 1 04/22/2018 at Unknown time  . rosuvastatin (CRESTOR) 20 MG tablet Take 0.5 tablets (10 mg total) by mouth daily. (Patient taking differently: Take 10 mg by mouth every evening. ) 45 tablet 3 04/21/2018 at Unknown time  . sennosides-docusate sodium (SENOKOT-S) 8.6-50 MG tablet Take 2 tablets by mouth at bedtime.   Past Week at Unknown time    SH: Social History   Tobacco Use  . Smoking status: Former Smoker    Packs/day: 1.00    Years: 20.00    Pack years: 20.00    Last attempt to quit: 03/13/1998    Years since quitting: 20.1  . Smokeless tobacco: Never Used  Substance Use Topics  . Alcohol use: No  . Drug use: No    MEDS: Prior to Admission medications   Medication Sig Start Date End Date Taking? Authorizing Provider  amLODipine (NORVASC) 5 MG tablet TAKE 1 TABLET ONCE DAILY. 01/18/18  Yes Plotnikov, Evie Lacks, MD  aspirin EC 81 MG tablet Take 2 tablets (162 mg total) by mouth daily. 04/15/18  Yes Plotnikov, Evie Lacks, MD  atenolol (TENORMIN) 100 MG tablet TAKE (1/2) TABLET TWICE DAILY. Patient taking differently: Take 50 mg by mouth 2 (two) times daily.  03/21/18  Yes Plotnikov, Evie Lacks, MD  Multiple Vitamins-Minerals (CENTRUM SILVER) CHEW Chew 1 each by mouth 3 (three) times a week.    Yes [provider]  PARoxetine (PAXIL) 10 MG tablet TAKE 1 TABLET IN THE MORNING. Patient taking differently: Take 5 mg by mouth daily.  11/22/17  Yes Plotnikov, Evie Lacks, MD  rosuvastatin (CRESTOR) 20 MG tablet Take 0.5 tablets (10 mg total) by mouth daily. Patient taking differently: Take 10 mg by mouth every evening.  10/06/17  Yes Plotnikov, Evie Lacks, MD  sennosides-docusate sodium (SENOKOT-S) 8.6-50 MG tablet Take 2 tablets by mouth at bedtime.   Yes [provider]    ALLERGY: Allergies  Allergen Reactions  . Captopril     REACTION: cough    Social History   Tobacco Use  . Smoking status: Former Smoker    Packs/day: 1.00    Years: 20.00    Pack  years: 20.00    Last attempt to quit: 03/13/1998    Years since quitting: 20.1  . Smokeless tobacco: Never Used  Substance Use Topics  . Alcohol use: No     Family History  Problem Relation Age of Onset  . Heart disease Mother   . Alcohol abuse Father   . Heart disease Father   . Hyperlipidemia Other   . Hypertension Other   . Stroke Other      ROS   ROS  Exam   Vitals:   04/22/18 1801 04/22/18 1950  BP: 127/69 (!) 158/76  Pulse: (!) 56 (!) 59  Resp: 17 16  Temp:  98.4 F (36.9 C)  SpO2: 99% 94%   General appearance: Elderly female, resting comfortably Eyes: PERRL, Fundoscopic: normal Cardiovascular: Regular rate and rhythm without murmurs, rubs, gallops. No  edema or variciosities. Distal pulses normal. Pulmonary: Clear to auscultation Musculoskeletal:     Muscle tone upper extremities: Normal    Muscle tone lower extremities: Normal    Motor exam: Upper Extremities Deltoid Bicep Tricep Grip  Right 5/5 5/5 5/5 5/5  Left 5/5 5/5 5/5 5/5   Lower Extremity IP Quad PF DF EHL  Right 5/5 5/5 5/5 5/5 5/5  Left 5/5 5/5 5/5 5/5 5/5   Neurological Awake, alert, oriented Memory and concentration grossly intact Speech fluent, appropriate CNII: Visual fields normal CNIII/IV/VI: EOMI CNV: Facial sensation normal CNVII: Symmetric, normal strength CNVIII: Grossly normal CNIX: Normal palate movement CNXI: Trap and SCM strength normal CN XII: Tongue protrusion normal Sensation grossly intact to LT DTR: Normal Coordination (finger/nose & heel/shin): Normal  Results - Imaging/Labs   Results for orders placed or performed during the hospital encounter of 04/22/18 (from the past 48 hour(s))  Ethanol     Status: None   Collection Time: 04/22/18 10:07 AM  Result Value Ref Range   Alcohol, Ethyl (B) <10 <10 mg/dL    Comment: (NOTE) Lowest detectable limit for serum alcohol is 10 mg/dL. For medical purposes only. Performed at [facility], [place]  [address] [place], [place] [address]   Protime-INR     Status: None   Collection Time: 04/22/18 10:07 AM  Result Value Ref Range   Prothrombin Time 12.3 11.4 - 15.2 seconds   INR 0.92     Comment: Performed at [facility], [place] [address] [place], [place] [address]  APTT     Status: None   Collection Time: 04/22/18 10:07 AM  Result Value Ref Range   aPTT 27 24 - 36 seconds    Comment: Performed at [facility], [place] [address] [place], [place] [address]  CBC     Status: None   Collection Time: 04/22/18 10:07 AM  Result Value Ref Range   WBC 6.9 4.0 - 10.5 K/uL   RBC 4.55 3.87 - 5.11 MIL/uL   Hemoglobin 13.0 12.0 - 15.0 g/dL   HCT 37.9 36.0 - 46.0 %   MCV 83.3 78.0 - 100.0 fL   MCH 28.6 26.0 - 34.0 pg   MCHC 34.3 30.0 - 36.0 g/dL   RDW 15.1 11.5 - 15.5 %   Platelets 359 150 - 400 K/uL    Comment: Performed at [facility], [place] [address], [place], [place] [address]  Differential     Status: None   Collection Time: 04/22/18 10:07 AM  Result Value Ref Range   Neutrophils Relative % 51 %   Neutro Abs 3.5 1.7 - 7.7 K/uL   Lymphocytes Relative 36 %   Lymphs Abs 2.5 0.7 - 4.0 K/uL   Monocytes Relative 10 %   Monocytes Absolute 0.7 0.1 - 1.0 K/uL   Eosinophils Relative 3 %   Eosinophils Absolute 0.2 0.0 - 0.7 K/uL   Basophils Relative 0 %   Basophils Absolute 0.0 0.0 - 0.1 K/uL    Comment: Performed at [facility], [place] [address], [place],  [address]  Comprehensive metabolic panel     Status: Abnormal   Collection Time: 04/22/18 10:07 AM  Result Value Ref Range   Sodium 138 135 - 145 mmol/L   Potassium 3.6 3.5 - 5.1 mmol/L   Chloride 102 98 - 111 mmol/L   CO2 28 22 - 32 mmol/L   Glucose, Bld 121 (H) 70 - 99 mg/dL  BUN 24 (H) 8 - 23 mg/dL   Creatinine, Ser 1.12 (H) 0.44 - 1.00 mg/dL   Calcium 9.7 8.9 - 10.3 mg/dL   Total Protein 8.0 6.5 - 8.1 g/dL   Albumin 4.7 3.5 - 5.0 g/dL   AST 30 15  - 41 U/L   ALT 17 0 - 44 U/L   Alkaline Phosphatase 51 38 - 126 U/L   Total Bilirubin 0.9 0.3 - 1.2 mg/dL   GFR calc non Af Amer 44 (L) >60 mL/min   GFR calc Af Amer 50 (L) >60 mL/min    Comment: (NOTE) The eGFR has been calculated using the CKD EPI equation. This calculation has not been validated in all clinical situations. eGFR's persistently <60 mL/min signify possible Chronic Kidney Disease.    Anion gap 8 5 - 15    Comment: Performed at [facility], [place] [address], [place], [place] [address]  Troponin I     Status: None   Collection Time: 04/22/18 10:07 AM  Result Value Ref Range   Troponin I <0.03 <0.03 ng/mL    Comment: Performed at [facility], [place] [address] [place], [place] [address]  Urine rapid drug screen (hosp performed)not at [facility]     Status: None   Collection Time: 04/22/18 11:01 AM  Result Value Ref Range   Opiates NONE DETECTED NONE DETECTED   Cocaine NONE DETECTED NONE DETECTED   Benzodiazepines NONE DETECTED NONE DETECTED   Amphetamines NONE DETECTED NONE DETECTED   Tetrahydrocannabinol NONE DETECTED NONE DETECTED   Barbiturates NONE DETECTED NONE DETECTED    Comment: (NOTE) DRUG SCREEN FOR MEDICAL PURPOSES ONLY.  IF CONFIRMATION IS NEEDED FOR ANY PURPOSE, NOTIFY LAB WITHIN 5 DAYS. LOWEST DETECTABLE LIMITS FOR URINE DRUG SCREEN Drug Class                     Cutoff (ng/mL) Amphetamine and metabolites    1000 Barbiturate and metabolites    200 Benzodiazepine                 027 Tricyclics and metabolites     300 Opiates and metabolites        300 Cocaine and metabolites        300 THC                            50 Performed at [facility], [place] [address], [place], [place] [address]   Urinalysis, Routine w reflex microscopic     Status: Abnormal   Collection Time: 04/22/18 11:01 AM  Result Value Ref Range   Color, Urine YELLOW YELLOW   APPearance CLOUDY (A) CLEAR   Specific Gravity,  Urine 1.012 1.005 - 1.030   pH 6.0 5.0 - 8.0   Glucose, UA NEGATIVE NEGATIVE mg/dL   Hgb urine dipstick SMALL (A) NEGATIVE   Bilirubin Urine NEGATIVE NEGATIVE   Ketones, ur 5 (A) NEGATIVE mg/dL   Protein, ur NEGATIVE NEGATIVE mg/dL   Nitrite NEGATIVE NEGATIVE   Leukocytes, UA LARGE (A) NEGATIVE   RBC / HPF 11-20 0 - 5 RBC/hpf   WBC, UA >50 (H) 0 - 5 WBC/hpf   Bacteria, UA RARE (A) NONE SEEN   Squamous Epithelial / LPF 6-10 0 - 5   WBC Clumps PRESENT    Mucus PRESENT     Comment: Performed at [facility], [place] [address] [place],  [address]  Dg Chest 2 View  Result Date: 04/22/2018 CLINICAL DATA:  Intermittent LEFT arm numbness for 2 weeks, history hypertension, diabetes mellitus, anxiety, former smoker EXAM: CHEST - 2 VIEW COMPARISON:  02/12/2018 FINDINGS: Normal heart size, mediastinal contours, and pulmonary vascularity. Atherosclerotic calcification aorta. Lungs hyperinflated with minimal subsegmental atelectasis at LEFT costophrenic angle. Remaining lungs clear. No acute infiltrate, pleural effusion or pneumothorax. Bones appear demineralized with old healed fracture of the posterolateral RIGHT sixth rib. IMPRESSION: Hyperinflated lungs with minimal subsegmental atelectasis at LEFT costophrenic angle. Electronically Signed   By: Lavonia Dana M.D.   On: 04/22/2018 11:07   Ct Head Wo Contrast  Result Date: 04/22/2018 CLINICAL DATA:  Left arm numbness. EXAM: CT HEAD WITHOUT CONTRAST CT CERVICAL SPINE WITHOUT CONTRAST TECHNIQUE: Multidetector CT imaging of the head and cervical spine was performed following the standard protocol without intravenous contrast. Multiplanar CT image reconstructions of the cervical spine were also generated. COMPARISON:  None. FINDINGS: CT HEAD FINDINGS Brain: Mild diffuse cortical atrophy is noted. Mild chronic ischemic white matter disease is noted. No mass effect or midline shift is noted. Ventricular size is within normal limits.  There is no evidence of mass lesion, hemorrhage or acute infarction. Vascular: No hyperdense vessel or unexpected calcification. Skull: Normal. Negative for fracture or focal lesion. Sinuses/Orbits: No acute finding. Other: None. CT CERVICAL SPINE FINDINGS Alignment: Grade 1 anterolisthesis of C3-4 C4-5 is noted secondary to posterior facet joint hypertrophy. Skull base and vertebrae: No acute fracture. No primary bone lesion or focal pathologic process. Soft tissues and spinal canal: No prevertebral fluid or swelling. No visible canal hematoma. Disc levels: Severe degenerative disc disease is noted at C5-6 and C6-7 with anterior osteophyte formation. Mild degenerative disc disease is noted at C4-5 and C7-T1. Upper chest: Negative. Other: Degenerative changes seen involving posterior facet joints bilaterally, right greater than left. IMPRESSION: Mild diffuse cortical atrophy. Mild chronic ischemic white matter disease. No acute intracranial abnormality seen. Multilevel degenerative disc disease is noted in the cervical spine. No fracture or other acute abnormality is noted. Electronically Signed   By: Marijo Conception, M.D.   On: 04/22/2018 11:09   Ct Cervical Spine Wo Contrast  Result Date: 04/22/2018 CLINICAL DATA:  Left arm numbness. EXAM: CT HEAD WITHOUT CONTRAST CT CERVICAL SPINE WITHOUT CONTRAST TECHNIQUE: Multidetector CT imaging of the head and cervical spine was performed following the standard protocol without intravenous contrast. Multiplanar CT image reconstructions of the cervical spine were also generated. COMPARISON:  None. FINDINGS: CT HEAD FINDINGS Brain: Mild diffuse cortical atrophy is noted. Mild chronic ischemic white matter disease is noted. No mass effect or midline shift is noted. Ventricular size is within normal limits. There is no evidence of mass lesion, hemorrhage or acute infarction. Vascular: No hyperdense vessel or unexpected calcification. Skull: Normal. Negative for fracture or  focal lesion. Sinuses/Orbits: No acute finding. Other: None. CT CERVICAL SPINE FINDINGS Alignment: Grade 1 anterolisthesis of C3-4 C4-5 is noted secondary to posterior facet joint hypertrophy. Skull base and vertebrae: No acute fracture. No primary bone lesion or focal pathologic process. Soft tissues and spinal canal: No prevertebral fluid or swelling. No visible canal hematoma. Disc levels: Severe degenerative disc disease is noted at C5-6 and C6-7 with anterior osteophyte formation. Mild degenerative disc disease is noted at C4-5 and C7-T1. Upper chest: Negative. Other: Degenerative changes seen involving posterior facet joints bilaterally, right greater than left. IMPRESSION: Mild diffuse cortical atrophy. Mild chronic ischemic white matter disease. No acute intracranial abnormality seen. Multilevel  degenerative disc disease is noted in the cervical spine. No fracture or other acute abnormality is noted. Electronically Signed   By: Marijo Conception, M.D.   On: 04/22/2018 11:09   Mr Brain Wo Contrast (neuro Protocol)  Result Date: 04/22/2018 CLINICAL DATA:  Numbness or tingling, paresthesia. Intermittent left arm numbness for 2 weeks EXAM: MRI HEAD WITHOUT CONTRAST TECHNIQUE: Multiplanar, multiecho pulse sequences of the brain and surrounding structures were obtained without intravenous contrast. COMPARISON:  CT from earlier today FINDINGS: Brain: Small focus of subarachnoid FLAIR hyperintensity and gradient hypointensity along the precentral gyrus on the right, this area is high-density by CT. No other site of hemorrhage seen, either acute or chronic. There is mild for age chronic small vessel ischemia in the cerebral white matter. Mild cerebral volume loss. Clustered appearance of gyri at the left vertex, nonspecific and likely noncontributory. No underlying mass is seen and there is no extrinsic mass effect by collection. No hydrocephalus. Vascular: Major flow voids are preserved Skull and upper cervical  spine: No evidence of marrow lesion. Diffuse cervical facet spurring with mild C3-4 and C4-5 anterolisthesis. Sinuses/Orbits: Left cataract resection. Other: These results were called by telephone at the time of interpretation on 04/22/2018 at 2:10 pm to Dr. Francine Graven , who verbally acknowledged these results. IMPRESSION: 1. Small volume subarachnoid hemorrhage along the high right frontal convexity. Trauma, coagulopathy, or vasculopathy/amyloid are primary considerations in this location. There is normal superior sagittal sinus flow voids. No acute or remote hemorrhage seen elsewhere. 2. Mild atrophy and chronic small vessel ischemia for age. Electronically Signed   By: Monte Fantasia M.D.   On: 04/22/2018 14:14    Impression/Plan   84 y.o. female with four episodes of LUE numbness/tingling. She is currently asymptomatic. She is neurologically intact. MRI of brain revealed small volume of SAH. There is no role for acute ns intervention presently. Because she denies any recent traumatic event, will obtain CTA head/neck to assess for vascular abnormality as a possible cause. Even if negative, we may consider diagnostic angiogram.  - Admitted under [facility]. Neurology to consult - Neuro checks q 2 hours - Keppra for seizure as possible cause - BP goal <140

## 2018-04-22 NOTE — Telephone Encounter (Signed)
Noted. Thx.

## 2018-04-22 NOTE — ED Triage Notes (Signed)
Pt reports intermittent left arm numbness over 2 weeks. Reports was seen at her PCP last Friday and was told wasn't stroke related.

## 2018-04-22 NOTE — Consult Note (Signed)
Requesting Physician: Dr. Mariea ClontsEmokpae    Chief Complaint: Stroke   History obtained from: Patient and Chart    HPI:                                                                                                                                       Vickie Wilson is an 84 y.o. female past medical history of hypertension, hyperlipidemia, diabetes mellitus, atrial mass who presents to the emergency room after having multiple spells of numbness over the left hand extending to the arm as well as left facial numbness.  Stated her first episode was about 1 week ago lasting about 15 minutes mainly if he affecting her left hand, arm as well as the left side of her face. Resolved spontaneously.  Continued to have 3 more episodes and therefore today she decided to come to the hospital.  She denies any weakness, however states she was not able to use her hand like she normally could.  Patient denies any jerking/twitching movements of the left arm or face.  Work-up in the ER included MRI brain which revealed a small area of subarachnoid hemorrhage on MRI brain in the right frontal lobe.  Her CT scan was initially read as negative however there may be a subtle hyperdensity that was not read.  She denied any history of recent head trauma, however she had a fall few weeks ago. She is on ASA at home.      Past Medical History:  Diagnosis Date  . Allergy   . Anxiety   . Atrial mass    right  . Constipation   . Depression   . Diabetes mellitus   . Hyperlipidemia   . Hypertension   . Osteoporosis     Past Surgical History:  Procedure Laterality Date  . DILATION AND CURETTAGE OF UTERUS  1987    Family History  Problem Relation Age of Onset  . Heart disease Mother   . Alcohol abuse Father   . Heart disease Father   . Hyperlipidemia Other   . Hypertension Other   . Stroke Other    Social History:  reports that she quit smoking about 20 years ago. She has a 20.00 pack-year smoking history. She has  never used smokeless tobacco. She reports that she does not drink alcohol or use drugs.  Allergies:  Allergies  Allergen Reactions  . Captopril     REACTION: cough    Medications:  I reviewed home medications   ROS:                                                                                                                                     14 systems reviewed and negative except above    Examination:                                                                                                      General: Appears well-developed and well-nourished.  Psych: Affect appropriate to situation Eyes: No scleral injection HENT: No OP obstrucion Head: Normocephalic.  Cardiovascular: Normal rate and regular rhythm.  Respiratory: Effort normal and breath sounds normal to anterior ascultation GI: Soft.  No distension. There is no tenderness.  Skin: WDI    Neurological Examination Mental Status: Alert, oriented, thought content appropriate.  Speech fluent without evidence of aphasia. Able to follow 3 step commands without difficulty. Cranial Nerves: II: Visual fields grossly normal,  III,IV, VI: ptosis not present, extra-ocular motions intact bilaterally, pupils equal, round, reactive to light and accommodation V,VII: smile symmetric, facial light touch sensation normal bilaterally VIII: hearing normal bilaterally IX,X: uvula rises symmetrically XI: bilateral shoulder shrug XII: midline tongue extension Motor: Right : Upper extremity   5/5    Left:     Upper extremity   5/5  Lower extremity   5/5     Lower extremity   5/5 Tone and bulk:normal tone throughout; no atrophy noted Sensory: Pinprick and light touch intact throughout, bilaterally Deep Tendon Reflexes: 2+ and symmetric throughout Plantars: Right: downgoing   Left: downgoing Cerebellar: normal  finger-to-nose, normal rapid alternating movements and normal heel-to-shin test Gait: normal gait and station     Lab Results: Basic Metabolic Panel: Recent Labs  Lab 04/22/18 1007  NA 138  K 3.6  CL 102  CO2 28  GLUCOSE 121*  BUN 24*  CREATININE 1.12*  CALCIUM 9.7    CBC: Recent Labs  Lab 04/22/18 1007  WBC 6.9  NEUTROABS 3.5  HGB 13.0  HCT 37.9  MCV 83.3  PLT 359    Coagulation Studies: Recent Labs    04/22/18 1007  LABPROT 12.3  INR 0.92    Imaging: Dg Chest 2 View  Result Date: 04/22/2018 CLINICAL DATA:  Intermittent LEFT arm numbness for 2 weeks, history hypertension, diabetes mellitus, anxiety, former smoker EXAM: CHEST - 2 VIEW COMPARISON:  02/12/2018 FINDINGS: Normal heart size, mediastinal contours, and pulmonary vascularity. Atherosclerotic calcification aorta. Lungs hyperinflated with minimal  subsegmental atelectasis at LEFT costophrenic angle. Remaining lungs clear. No acute infiltrate, pleural effusion or pneumothorax. Bones appear demineralized with old healed fracture of the posterolateral RIGHT sixth rib. IMPRESSION: Hyperinflated lungs with minimal subsegmental atelectasis at LEFT costophrenic angle. Electronically Signed   By: Ulyses Southward M.D.   On: 04/22/2018 11:07   Ct Head Wo Contrast  Result Date: 04/22/2018 CLINICAL DATA:  Left arm numbness. EXAM: CT HEAD WITHOUT CONTRAST CT CERVICAL SPINE WITHOUT CONTRAST TECHNIQUE: Multidetector CT imaging of the head and cervical spine was performed following the standard protocol without intravenous contrast. Multiplanar CT image reconstructions of the cervical spine were also generated. COMPARISON:  None. FINDINGS: CT HEAD FINDINGS Brain: Mild diffuse cortical atrophy is noted. Mild chronic ischemic white matter disease is noted. No mass effect or midline shift is noted. Ventricular size is within normal limits. There is no evidence of mass lesion, hemorrhage or acute infarction. Vascular: No hyperdense vessel  or unexpected calcification. Skull: Normal. Negative for fracture or focal lesion. Sinuses/Orbits: No acute finding. Other: None. CT CERVICAL SPINE FINDINGS Alignment: Grade 1 anterolisthesis of C3-4 C4-5 is noted secondary to posterior facet joint hypertrophy. Skull base and vertebrae: No acute fracture. No primary bone lesion or focal pathologic process. Soft tissues and spinal canal: No prevertebral fluid or swelling. No visible canal hematoma. Disc levels: Severe degenerative disc disease is noted at C5-6 and C6-7 with anterior osteophyte formation. Mild degenerative disc disease is noted at C4-5 and C7-T1. Upper chest: Negative. Other: Degenerative changes seen involving posterior facet joints bilaterally, right greater than left. IMPRESSION: Mild diffuse cortical atrophy. Mild chronic ischemic white matter disease. No acute intracranial abnormality seen. Multilevel degenerative disc disease is noted in the cervical spine. No fracture or other acute abnormality is noted. Electronically Signed   By: Lupita Raider, M.D.   On: 04/22/2018 11:09   Ct Cervical Spine Wo Contrast  Result Date: 04/22/2018 CLINICAL DATA:  Left arm numbness. EXAM: CT HEAD WITHOUT CONTRAST CT CERVICAL SPINE WITHOUT CONTRAST TECHNIQUE: Multidetector CT imaging of the head and cervical spine was performed following the standard protocol without intravenous contrast. Multiplanar CT image reconstructions of the cervical spine were also generated. COMPARISON:  None. FINDINGS: CT HEAD FINDINGS Brain: Mild diffuse cortical atrophy is noted. Mild chronic ischemic white matter disease is noted. No mass effect or midline shift is noted. Ventricular size is within normal limits. There is no evidence of mass lesion, hemorrhage or acute infarction. Vascular: No hyperdense vessel or unexpected calcification. Skull: Normal. Negative for fracture or focal lesion. Sinuses/Orbits: No acute finding. Other: None. CT CERVICAL SPINE FINDINGS Alignment:  Grade 1 anterolisthesis of C3-4 C4-5 is noted secondary to posterior facet joint hypertrophy. Skull base and vertebrae: No acute fracture. No primary bone lesion or focal pathologic process. Soft tissues and spinal canal: No prevertebral fluid or swelling. No visible canal hematoma. Disc levels: Severe degenerative disc disease is noted at C5-6 and C6-7 with anterior osteophyte formation. Mild degenerative disc disease is noted at C4-5 and C7-T1. Upper chest: Negative. Other: Degenerative changes seen involving posterior facet joints bilaterally, right greater than left. IMPRESSION: Mild diffuse cortical atrophy. Mild chronic ischemic white matter disease. No acute intracranial abnormality seen. Multilevel degenerative disc disease is noted in the cervical spine. No fracture or other acute abnormality is noted. Electronically Signed   By: Lupita Raider, M.D.   On: 04/22/2018 11:09   Mr Brain Wo Contrast (neuro Protocol)  Result Date: 04/22/2018 CLINICAL DATA:  Numbness or  tingling, paresthesia. Intermittent left arm numbness for 2 weeks EXAM: MRI HEAD WITHOUT CONTRAST TECHNIQUE: Multiplanar, multiecho pulse sequences of the brain and surrounding structures were obtained without intravenous contrast. COMPARISON:  CT from earlier today FINDINGS: Brain: Small focus of subarachnoid FLAIR hyperintensity and gradient hypointensity along the precentral gyrus on the right, this area is high-density by CT. No other site of hemorrhage seen, either acute or chronic. There is mild for age chronic small vessel ischemia in the cerebral white matter. Mild cerebral volume loss. Clustered appearance of gyri at the left vertex, nonspecific and likely noncontributory. No underlying mass is seen and there is no extrinsic mass effect by collection. No hydrocephalus. Vascular: Major flow voids are preserved Skull and upper cervical spine: No evidence of marrow lesion. Diffuse cervical facet spurring with mild C3-4 and C4-5  anterolisthesis. Sinuses/Orbits: Left cataract resection. Other: These results were called by telephone at the time of interpretation on 04/22/2018 at 2:10 pm to Dr. Samuel Jester , who verbally acknowledged these results. IMPRESSION: 1. Small volume subarachnoid hemorrhage along the high right frontal convexity. Trauma, coagulopathy, or vasculopathy/amyloid are primary considerations in this location. There is normal superior sagittal sinus flow voids. No acute or remote hemorrhage seen elsewhere. 2. Mild atrophy and chronic small vessel ischemia for age. Electronically Signed   By: Marnee Spring M.D.   On: 04/22/2018 14:14     I have reviewed the above imaging Ct head and Mri brain    ASSESSMENT AND PLAN  84 y.o. female past medical history of hypertension, hyperlipidemia, diabetes mellitus, atrial mass who presents to the emergency room after having multiple spells of numbness over the left hand extending to the arm as well as left facial numbness. Subarachnoid hemorrhage in the right frontal cortex on MRI imaging.  Given several hyperdensity on CT may represent a subacute bleed.  This is not aneurysmal and I suspect is traumatic.  No concerns for sinus venous thrombosis seen on MRI.  She was evaluated by neurosurgery who recommended a CTA head and neck.  Her symptoms itself favor possible simple partial seizures.  They could also be migraines however she does not have a history of migraines.  She was started on Keppra 500 mg twice daily and admitted for further workup.    Possible simple partial seizures in the setting of subacute SAH.  D/D includes TIA, migraine   Continue Keppra 500mg  BID Seizure precautions Obtain Transthoracic Echocardiogram  No ASA    Sushanth Aroor [facility] Pager Number [phone]

## 2018-04-23 ENCOUNTER — Inpatient Hospital Stay (HOSPITAL_COMMUNITY): Payer: Medicare Other

## 2018-04-23 DIAGNOSIS — I361 Nonrheumatic tricuspid (valve) insufficiency: Secondary | ICD-10-CM

## 2018-04-23 LAB — GLUCOSE, CAPILLARY: Glucose-Capillary: 146 mg/dL — ABNORMAL HIGH (ref 70–99)

## 2018-04-23 LAB — ECHOCARDIOGRAM COMPLETE
Height: 61 in
Weight: 1784.8442 oz

## 2018-04-23 MED ORDER — IOPAMIDOL (ISOVUE-370) INJECTION 76%
50.0000 mL | Freq: Once | INTRAVENOUS | Status: AC | PRN
  Administered 2018-04-23: 50 mL via INTRAVENOUS

## 2018-04-23 MED ORDER — PAROXETINE HCL 10 MG PO TABS
5.0000 mg | ORAL_TABLET | Freq: Every morning | ORAL | Status: DC
  Administered 2018-04-23 – 2018-04-24 (×2): 5 mg via ORAL
  Filled 2018-04-23: qty 0.5

## 2018-04-23 NOTE — Progress Notes (Signed)
Correction IV was for Echo and explained to the Pt and she wants to wait til she talks to the MD. Will pass on in shift change.

## 2018-04-23 NOTE — Progress Notes (Signed)
PROGRESS NOTE  Vickie Wilson WGN:[id number] DOB: 1931-08-06 DOA: 04/22/2018 PCP: Tresa Garter, MD  Brief Summary:  84 y.o. female past medical history of hypertension, hyperlipidemia, diabetes mellitus, atrial mass who presents to the emergency room after having multiple spells of numbness over the left hand extending to the arm as well as left facial numbness. Subarachnoid hemorrhage in the right frontal cortex on MRI imaging.     HPI/Recap of past 24 hours:  Feeling better, denies pain, no headache She is ambulating  Family at bedside  Assessment/Plan: Principal Problem:   SAH (subarachnoid hemorrhage) (HCC) Active Problems:   Anxiety state   Subarachnoid hemorrhage in the right frontal cortex  -she lives by herself, independent of ADLs, still drives -she denies trauma -She was evaluated by neurosurgery who recommended a CTA head and neck. -neurology consulted , recommended start on keppra for seizure prevention/TTE/EEG, avoid asa -will follow neurology and neurosurgery recommendations  HTN/HLD/diet controlled dm2 Stable on norvasc/atenolol/statin Asa held  CKDIII Renal function at baseline   Condition stable, transfer out of stepdown unit to med tele  Code Status: full  Family Communication: patient and family  Disposition Plan: home in 1-2 days , need neurology and neurosurgery clearance   Consultants:  Neurology  neurosurgery  Procedures:  EEG  Antibiotics:  none   Objective: BP 127/68 (BP Location: Right Arm)   Pulse 60   Temp 97.8 F (36.6 C) (Oral)   Resp 17   Ht 5\' 1"  (1.549 m)   Wt 50.6 kg   SpO2 99%   BMI 21.08 kg/m  No intake or output data in the 24 hours ending 04/23/18 1755 Filed Weights   04/22/18 2051  Weight: 50.6 kg    Exam: Patient is examined daily including today on 04/23/2018, exams remain the same as of yesterday except that has changed    General:  NAD, thin  Cardiovascular: RRR  Respiratory:  CTABL  Abdomen: Soft/ND/NT, positive BS  Musculoskeletal: No Edema  Neuro: alert, oriented   Data Reviewed: Basic Metabolic Panel: Recent Labs  Lab 04/22/18 1007  NA 138  K 3.6  CL 102  CO2 28  GLUCOSE 121*  BUN 24*  CREATININE 1.12*  CALCIUM 9.7   Liver Function Tests: Recent Labs  Lab 04/22/18 1007  AST 30  ALT 17  ALKPHOS 51  BILITOT 0.9  PROT 8.0  ALBUMIN 4.7   No results for input(s): LIPASE, AMYLASE in the last 168 hours. No results for input(s): AMMONIA in the last 168 hours. CBC: Recent Labs  Lab 04/22/18 1007  WBC 6.9  NEUTROABS 3.5  HGB 13.0  HCT 37.9  MCV 83.3  PLT 359   Cardiac Enzymes:   Recent Labs  Lab 04/22/18 1007  TROPONINI <0.03   BNP (last 3 results) No results for input(s): BNP in the last 8760 hours.  ProBNP (last 3 results) No results for input(s): PROBNP in the last 8760 hours.  CBG: Recent Labs  Lab 04/23/18 1127  GLUCAP 146*    No results found for this or any previous visit (from the past 240 hour(s)).   Studies: No results found.  Scheduled Meds: . amLODipine  10 mg Oral Daily  . atenolol  50 mg Oral Daily  . levETIRAcetam  500 mg Oral BID  . LORazepam  0.5-1 mg Intravenous Once  . multivitamin with minerals  1 tablet Oral Q M,W,F  . PARoxetine  5 mg Oral q morning - 10a  . rosuvastatin  10  mg Oral QPM  . senna-docusate  2 tablet Oral QHS  . sodium chloride flush  3 mL Intravenous Q12H    Continuous Infusions: . sodium chloride       Time spent: 35mins I have personally reviewed and interpreted on  04/23/2018 daily labs, tele strips, imagings as discussed above under date review session and assessment and plans.  I reviewed all nursing notes, pharmacy notes, consultant notes,  vitals, pertinent old records  I have discussed plan of care as described above with RN , patient and family on 04/23/2018   Albertine GratesFang Penny Arrambide MD, PhD  [facility] Pager [phone]. If 7PM-7AM, please contact night-coverage at  [email or web address], password [email or web address] 04/23/2018, 5:55 PM  LOS: 1 day

## 2018-04-23 NOTE — Progress Notes (Addendum)
NEURO HOSPITALIST PROGRESS NOTE   Subjective: Patient awake. Alert, eyes open in bed, NAD on RA. She states that this left arm numbness first started about 2-3 weeks ago. When she first noticed them she had an appt. Scheduled with her PCP. She went to him, and he told her she did not have a stroke, but to go to the ED if the symptoms returned. Symptoms returned for about 20 minutes 2-3 days later, and at this time she attempted to call her daughter who was unavailable and she called EMS. She declined transport to the hospital as the symptoms once again had resolved and she did not believe she needed to come. They left arm tingling came back for a 4th time and she decided to come and get them checked out. Denies having any episodes of arm numbness overnight. Denies CP, SOB, focal weakness, headache, changes in vision or tingling in the leg.  Patient did have questions r/t her plan of care and contrast dye, but questions answered and patient agreeable to plan. Granddaughter now at bedside. Note: patient does live alone, is very independent and still drives at baseline.  Exam: Vitals:   04/23/18 0024 04/23/18 0333  BP: (!) 135/118 131/72  Pulse: 62 (!) 57  Resp: 17   Temp: 97.7 F (36.5 C) 97.9 F (36.6 C)  SpO2: 98% 98%    Physical Exam   HEENT-  Normocephalic, no lesions, without obvious abnormality.  Normal external eye and conjunctiva.   Cardiovascular- S1-S2 audible, pulses palpable throughout   Lungs-no rhonchi or wheezing noted, no excessive working breathing.  Saturations within normal limits on RA. Abdomen- All 4 quadrants palpated and nontender, BS present x 4 Extremities- Warm, dry and intact Musculoskeletal-no joint tenderness, deformity or swelling Skin-warm and dry, no hyperpigmentation, vitiligo, or suspicious lesions   Neuro:  Mental Status: Alert, oriented, to age/name/hospital/situation/ month/ year/ city/ state, thought content appropriate.  Speech  fluent without evidence of aphasia.  Able to follow  commands without difficulty. Cranial Nerves: II:  Visual fields grossly normal,  III,IV, VI: ptosis not present, extra-ocular motions intact bilaterally pupils equal, round, reactive to light and accommodation V,VII: smile symmetric, facial light touch sensation normal bilaterally VIII: hearing normal bilaterally IX,X: uvula rises symmetrically XI: bilateral shoulder shrug XII: midline tongue extension Motor: Right : Upper extremity   5/5    Left:     Upper extremity   5/5  Lower extremity   5/5     Lower extremity   5/5 Tone and bulk:normal tone throughout; no atrophy noted Sensory:  light touch intact throughout, bilaterally Deep Tendon Reflexes: 2+ and symmetric biceps, patella Plantars: Right: downgoing   Left: downgoing Cerebellar: normal finger-to-nose,  normal heel-to-shin test Gait: normal gait and station    Medications:  Scheduled: . amLODipine  10 mg Oral Daily  . atenolol  50 mg Oral Daily  . levETIRAcetam  500 mg Oral BID  . LORazepam  0.5-1 mg Intravenous Once  . multivitamin with minerals  1 tablet Oral Q M,W,F  . PARoxetine  10 mg Oral q morning - 10a  . rosuvastatin  10 mg Oral QPM  . senna-docusate  2 tablet Oral QHS  . sodium chloride flush  3 mL Intravenous Q12H   Continuous: . sodium chloride     VWU:JWJXBJ chloride, acetaminophen **OR** acetaminophen, albuterol, ondansetron **OR** ondansetron (ZOFRAN) IV, polyethylene glycol,  sodium chloride flush  Pertinent Labs/Diagnostics:   Dg Chest 2 View  Result Date: 04/22/2018 CLINICAL DATA:  Intermittent LEFT arm numbness for 2 weeks, history hypertension, diabetes mellitus, anxiety, former smoker EXAM: CHEST - 2 VIEW COMPARISON:  02/12/2018 FINDINGS: Normal heart size, mediastinal contours, and pulmonary vascularity. Atherosclerotic calcification aorta. Lungs hyperinflated with minimal subsegmental atelectasis at LEFT costophrenic angle. Remaining lungs  clear. No acute infiltrate, pleural effusion or pneumothorax. Bones appear demineralized with old healed fracture of the posterolateral RIGHT sixth rib. IMPRESSION: Hyperinflated lungs with minimal subsegmental atelectasis at LEFT costophrenic angle. Electronically Signed   By: Ulyses Southward M.D.   On: 04/22/2018 11:07   Ct Head Wo Contrast  Result Date: 04/22/2018 CLINICAL DATA:  Left arm numbness. EXAM: CT HEAD WITHOUT CONTRAST CT CERVICAL SPINE WITHOUT CONTRAST TECHNIQUE: Multidetector CT imaging of the head and cervical spine was performed following the standard protocol without intravenous contrast. Multiplanar CT image reconstructions of the cervical spine were also generated. COMPARISON:  None. FINDINGS: CT HEAD FINDINGS Brain: Mild diffuse cortical atrophy is noted. Mild chronic ischemic white matter disease is noted. No mass effect or midline shift is noted. Ventricular size is within normal limits. There is no evidence of mass lesion, hemorrhage or acute infarction. Vascular: No hyperdense vessel or unexpected calcification. Skull: Normal. Negative for fracture or focal lesion. Sinuses/Orbits: No acute finding. Other: None. CT CERVICAL SPINE FINDINGS Alignment: Grade 1 anterolisthesis of C3-4 C4-5 is noted secondary to posterior facet joint hypertrophy. Skull base and vertebrae: No acute fracture. No primary bone lesion or focal pathologic process. Soft tissues and spinal canal: No prevertebral fluid or swelling. No visible canal hematoma. Disc levels: Severe degenerative disc disease is noted at C5-6 and C6-7 with anterior osteophyte formation. Mild degenerative disc disease is noted at C4-5 and C7-T1. Upper chest: Negative. Other: Degenerative changes seen involving posterior facet joints bilaterally, right greater than left. IMPRESSION: Mild diffuse cortical atrophy. Mild chronic ischemic white matter disease. No acute intracranial abnormality seen. Multilevel degenerative disc disease is noted in the  cervical spine. No fracture or other acute abnormality is noted. Electronically Signed   By: Lupita Raider, M.D.   On: 04/22/2018 11:09   Ct Cervical Spine Wo Contrast  Result Date: 04/22/2018 CLINICAL DATA:  Left arm numbness. EXAM: CT HEAD WITHOUT CONTRAST CT CERVICAL SPINE WITHOUT CONTRAST TECHNIQUE: Multidetector CT imaging of the head and cervical spine was performed following the standard protocol without intravenous contrast. Multiplanar CT image reconstructions of the cervical spine were also generated. COMPARISON:  None. FINDINGS: CT HEAD FINDINGS Brain: Mild diffuse cortical atrophy is noted. Mild chronic ischemic white matter disease is noted. No mass effect or midline shift is noted. Ventricular size is within normal limits. There is no evidence of mass lesion, hemorrhage or acute infarction. Vascular: No hyperdense vessel or unexpected calcification. Skull: Normal. Negative for fracture or focal lesion. Sinuses/Orbits: No acute finding. Other: None. CT CERVICAL SPINE FINDINGS Alignment: Grade 1 anterolisthesis of C3-4 C4-5 is noted secondary to posterior facet joint hypertrophy. Skull base and vertebrae: No acute fracture. No primary bone lesion or focal pathologic process. Soft tissues and spinal canal: No prevertebral fluid or swelling. No visible canal hematoma. Disc levels: Severe degenerative disc disease is noted at C5-6 and C6-7 with anterior osteophyte formation. Mild degenerative disc disease is noted at C4-5 and C7-T1. Upper chest: Negative. Other: Degenerative changes seen involving posterior facet joints bilaterally, right greater than left. IMPRESSION: Mild diffuse cortical atrophy. Mild chronic ischemic white  matter disease. No acute intracranial abnormality seen. Multilevel degenerative disc disease is noted in the cervical spine. No fracture or other acute abnormality is noted. Electronically Signed   By: Lupita RaiderJames  Green Jr, M.D.   On: 04/22/2018 11:09   Mr Brain Wo Contrast (neuro  Protocol)  Result Date: 04/22/2018 CLINICAL DATA:  Numbness or tingling, paresthesia. Intermittent left arm numbness for 2 weeks EXAM: MRI HEAD WITHOUT CONTRAST TECHNIQUE: Multiplanar, multiecho pulse sequences of the brain and surrounding structures were obtained without intravenous contrast. COMPARISON:  CT from earlier today FINDINGS: Brain: Small focus of subarachnoid FLAIR hyperintensity and gradient hypointensity along the precentral gyrus on the right, this area is high-density by CT. No other site of hemorrhage seen, either acute or chronic. There is mild for age chronic small vessel ischemia in the cerebral white matter. Mild cerebral volume loss. Clustered appearance of gyri at the left vertex, nonspecific and likely noncontributory. No underlying mass is seen and there is no extrinsic mass effect by collection. No hydrocephalus. Vascular: Major flow voids are preserved Skull and upper cervical spine: No evidence of marrow lesion. Diffuse cervical facet spurring with mild C3-4 and C4-5 anterolisthesis. Sinuses/Orbits: Left cataract resection. Other: These results were called by telephone at the time of interpretation on 04/22/2018 at 2:10 pm to Dr. Samuel JesterKATHLEEN MCMANUS , who verbally acknowledged these results. IMPRESSION: 1. Small volume subarachnoid hemorrhage along the high right frontal convexity. Trauma, coagulopathy, or vasculopathy/amyloid are primary considerations in this location. There is normal superior sagittal sinus flow voids. No acute or remote hemorrhage seen elsewhere. 2. Mild atrophy and chronic small vessel ischemia for age. Electronically Signed   By: Marnee SpringJonathon  Watts M.D.   On: 04/22/2018 14:14    Impression: 84 y.o. female past medical history of hypertension, hyperlipidemia, diabetes mellitus, atrial mass who presents to the emergency room after having multiple spells of numbness over the left hand extending to the arm as well as left facial numbness. Subarachnoid hemorrhage in the  right frontal cortex on MRI imaging.  Given several hyperdensity on CT may represent a subacute bleed.  This is not aneurysmal and I suspect is traumatic.  No concerns for sinus venous thrombosis seen on MRI.  She was evaluated by neurosurgery who recommended a CTA head and neck. CTA results pending.  Her symptoms itself favor possible simple partial seizures.  They could also be migraines however she does not have a history of migraines.  She was started on Keppra 500 mg twice daily and admitted for further workup ( first dose had not been given at time of exam).  Recommendations:  --start keppra 500 mg BID --continue seizure precautions  --TEE --Routine EEG --No ASA -Neurology will continue to follow  Valentina LucksJessica Williams, MSN, NP-C [facility] [phone]  Attending neurologist's note to follow  I have seen the patient reviewed the above note.  Intermittent numbness in a patient with a focal area of cortical subarachnoid.  I suspect that this subarachnoid may be irritating the cortex, and I agree with starting Keppra.  One other possibility would be that she has 2 separate pathologies at play, E.G. carotid stenosis or some other focal abnormality causing intermittent hypoperfusion.  I would favor getting a CTA head neck but if this does not show a clear area of stenosis leading to sensory cortex on the left than I would favor continuing Keppra seeing if it improves her left arm numbness as an outpatient.  Ritta SlotMcNeill Lena Gores, MD [facility] [phone]  If 7pm- 7am, please  page neurology on call as listed in AMION.   04/23/2018, 8:20 AM

## 2018-04-23 NOTE — Progress Notes (Signed)
  Echocardiogram 2D Echocardiogram has been performed.  Vickie ChimesWendy  Faline Wilson 04/23/2018, 2:55 PM

## 2018-04-23 NOTE — Progress Notes (Signed)
PT Cancellation Note  Patient Details Name: Murlean CallerJackie M Yanke MRN: [id number] DOB: 02/01/1931   Cancelled Treatment:    Reason Eval/Treat Not Completed: Patient at procedure or test/unavailable(attempted x2)   Fabio Asaevon J Oluwatomisin Hustead 04/23/2018, 2:23 PM

## 2018-04-23 NOTE — Progress Notes (Signed)
EEG complete - results pending 

## 2018-04-23 NOTE — Progress Notes (Signed)
Educated the Pt on the purpose of Keppra and she wanted to wait til she discussed it with the MD. She was not aware of it. Pt is alert and oriented x4. Also, MRI called for a 20G higher in the arm and the Pt refused and stated she had a MRI and RN explained why. Pt still wanted to wait.

## 2018-04-23 NOTE — Procedures (Signed)
History: 84 yo F with intermittent numbness  Sedation: None  Technique: This is a 21 channel routine scalp EEG performed at the bedside with bipolar and monopolar montages arranged in accordance to the international 10/20 system of electrode placement. One channel was dedicated to EKG recording.    Background: The background consists of intermixed alpha and beta activities. There is a well defined posterior dominant rhythm of 9-10 Hz that attenuates with eye opening. Sleep is not recorded.   Photic stimulation: Physiologic driving is not performed  EEG Abnormalities: None  Clinical Interpretation: This normal EEG is recorded in the waking state. There was no seizure or seizure predisposition recorded on this study. Please note that a normal EEG does not preclude the possibility of epilepsy.   Ritta SlotMcNeill Kirkpatrick, MD [facility] [phone]  If 7pm- 7am, please page neurology on call as listed in AMION.

## 2018-04-24 LAB — BASIC METABOLIC PANEL
Anion gap: 8 (ref 5–15)
BUN: 11 mg/dL (ref 8–23)
CO2: 25 mmol/L (ref 22–32)
Calcium: 9.2 mg/dL (ref 8.9–10.3)
Chloride: 102 mmol/L (ref 98–111)
Creatinine, Ser: 0.95 mg/dL (ref 0.44–1.00)
GFR calc Af Amer: >60 mL/min (ref >60)
GFR calc non Af Amer: 53 mL/min — ABNORMAL LOW (ref >60)
Glucose, Bld: 114 mg/dL — ABNORMAL HIGH (ref 70–99)
Potassium: 3.6 mmol/L (ref 3.5–5.1)
Sodium: 135 mmol/L (ref 135–145)

## 2018-04-24 LAB — CBC
HCT: 38.8 % (ref 36.0–46.0)
Hemoglobin: 12.7 g/dL (ref 12.0–15.0)
MCH: 27.5 pg (ref 26.0–34.0)
MCHC: 32.7 g/dL (ref 30.0–36.0)
MCV: 84.2 fL (ref 78.0–100.0)
Platelets: 297 10*3/uL (ref 150–400)
RBC: 4.61 MIL/uL (ref 3.87–5.11)
RDW: 14.7 % (ref 11.5–15.5)
WBC: 6.3 10*3/uL (ref 4.0–10.5)

## 2018-04-24 MED ORDER — LEVETIRACETAM 500 MG PO TABS: 500.0000 mg | ORAL_TABLET | Freq: Two times a day (BID) | ORAL | 0 refills | Status: DC

## 2018-04-24 NOTE — Progress Notes (Signed)
Patient and daughter given discharge summary and teaching, able to teach back. No new questions or concerns. IV and telemetry removed.   Discharged from unit by staff, daughter to transport home

## 2018-04-24 NOTE — Discharge Summary (Signed)
Discharge Summary  Vickie Wilson ZOX:[id number] DOB: 02/01/1931  PCP: Tresa GarterPlotnikov, Aleksei V, MD  Admit date: 04/22/2018 Discharge date: 04/24/2018  Time spent: 30mins  Recommendations for Outpatient Follow-up:  1. F/u with PMD within a week  for hospital discharge follow up, repeat cbc/bmp at follow up 2. F/u with neurology in 4 weeks 3. F/u with neurosurgery in 4 weeks  Discharge Diagnoses:  Active Hospital Problems   Diagnosis Date Noted  . SAH (subarachnoid hemorrhage) (HCC) 04/22/2018  . Anxiety state 06/29/2007    Resolved Hospital Problems  No resolved problems to display.    Discharge Condition: stable  Diet recommendation: heart healthy  Filed Weights   04/22/18 2051  Weight: 50.6 kg    History of present illness: ( per admitting MD Dr Mariea ClontsEmokpae) Vickie ReasonerJackie Wilson  is a 84 y.o. female with past medical history history relevant for history of TIAs, dyslipidemia, hypertension, anxiety and depressive disorder since to the ED with complaints of left arm as well as facial numbness on and off for over a week now, patient was seen by PCP Dr. Posey ReaPlotnikov on 04/15/2018.  Symptoms resolved after that visit but then did be more persistent lately patient had EMS to the hospital recently symptoms improved when EMS arrived so she was not transported to the ED but today symptoms became more persistent so patient came to the ED  In ED--- CT head was unremarkable, however MRI brain showed small volume subarachnoid hemorrhage, ED provider discussed this case with on-call neurologist Dr. Onalee HuaMcNeil Kirkpatrick, who advised transfer [facility] for further neurology evaluation.   Patient granddaughter and  daughter at bedside, additional history obtained both of them, patient denies any history of significant falls or head injury recently.  Patient is not on any anticoagulants, denies excessive NSAID use, she was taking aspirin 162 mg daily  Patient denies significant alcohol intake or use  of any mind altering substances  Small SAH on brain MRI, patient is clinically and neurologically stable, ED provider discussed case with on-call neurologist Dr. Kirstie PeriMcKinney Kilpatrick, I discussed this case with on-call neurosurgical PA Mr. Cindra PresumeVincent Costella Official neurology and official neurosurgical consults are pending  Hospital Course:  Principal Problem:   SAH (subarachnoid hemorrhage) (HCC) Active Problems:   Anxiety state   Subarachnoid hemorrhage in the right frontal cortex  -she lives by herself, independent of ADLs, still drives -she denies trauma -She was evaluated by neurosurgery who recommended a CTA head and neck, please see below --TTE lvef wnl, grade 1 diastolic dysfunction, no cardiac emolism -Tele unremarkable -basic labs unremarkable -EEG "This normal EEG is recorded in the waking state. There was no seizure or seizure predisposition recorded on this study. Please note that a normal EEG does not preclude the possibility of epilepsy. 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 Recommend follow-up CTA of the head in 1 year to assess stability. This aneurysm does not correspond with the area of subarachnoid hemorrhage. F/u with neurosurgery in 4 weeks.  PT recommended outpatient PT.  HTN/HLD/diet controlled dm2 Stable on norvasc/atenolol/statin Asa held  CKDIII Renal function at baseline     Code Status: full  Family Communication: patient and family  Disposition Plan: home with neurology and neurosurgery clearance   Consultants:  Neurology  neurosurgery  Procedures:  EEG  Antibiotics:  none  Discharge Exam: BP (!) 146/72 (BP Location: Right Arm)   Pulse 65   Temp 97.9 F (36.6 C) (Oral)   Resp 16   Ht 5\' 1"  (1.549 m)   Wt  50.6 kg   SpO2 97%   BMI 21.08 kg/m   General: NAD Cardiovascular: RRR Respiratory: CTABL  Discharge Instructions You were cared for by a hospitalist during your hospital stay. If you have any questions about your discharge medications or the care you received while you were in the hospital after you are discharged, you can call the unit and asked to speak with the hospitalist on call if the hospitalist that took care of you is not available. Once you are discharged, your primary care physician will handle any further medical issues. Please note that NO REFILLS for any discharge medications will be authorized once you are discharged, as it is imperative that you return to your primary care physician (or establish a relationship with a primary care physician if you do not have one) for your aftercare needs so that they can reassess your need for medications and monitor your lab values.  Discharge Instructions    Ambulatory referral to Neurology   Complete by:  As directed    An appointment is requested in approximately: 4 weeks   Ambulatory referral to Physical Therapy   Complete by:  As directed    Diet - low sodium heart healthy   Complete by:  As directed    Discharge instructions   Complete by:  As directed    Please check your blood pressure at home, bring in recording to your doctor, pcp to further adjust bp meds if needed.   Increase activity slowly   Complete by:  As directed      Allergies as of 04/24/2018      Reactions   Captopril    REACTION: cough      Medication List    STOP taking these medications   aspirin EC 81 MG tablet     TAKE these medications   amLODipine 5 MG tablet Commonly known as:  NORVASC TAKE 1 TABLET ONCE DAILY.   atenolol 100 MG tablet Commonly known as:  TENORMIN TAKE (1/2) TABLET TWICE DAILY. What changed:  See the new instructions.   CENTRUM SILVER Chew Chew 1 each by mouth 3 (three) times a week.   levETIRAcetam 500 MG  tablet Commonly known as:  KEPPRA Take 1 tablet (500 mg total) by mouth 2 (two) times daily.   PARoxetine 10 MG tablet Commonly known as:  PAXIL TAKE 1 TABLET IN THE MORNING. What changed:    how much to take  when to take this   rosuvastatin 20 MG tablet Commonly known as:  CRESTOR Take 0.5 tablets (10 mg total) by mouth daily. What changed:  when to take this   sennosides-docusate sodium 8.6-50 MG tablet Commonly known as:  SENOKOT-S Take 2 tablets by mouth at bedtime.      Allergies  Allergen Reactions  . Captopril     REACTION: cough   Follow-up Information    Plotnikov, Georgina Quint, MD Follow up.   Specialty:  Internal Medicine Contact information: [address] [place] [address] [phone]        repeat CTA head in 60yr Follow up.   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 Recommend follow-up CTA of the head in 1 year to assess stability.  [facility] Follow up.   Specialty:  Neurosurgery Contact information: [address] [place] [address] [phone]        Alyson Ingles, PA-C Follow up in 4 week(s).   Specialty:  Physician Assistant Contact information: [address] [place] [address] [phone]            The results of significant diagnostics from this hospitalization (including imaging, microbiology, ancillary and laboratory) are listed below for reference.    Significant Diagnostic Studies: Ct Angio Head W Or Wo Contrast  Result Date: 04/23/2018 CLINICAL DATA:  Subarachnoid hemorrhage, proven, follow-up. EXAM: CT ANGIOGRAPHY HEAD AND NECK TECHNIQUE: Multidetector CT imaging of the head and neck was performed using the standard protocol during bolus administration of intravenous contrast. Multiplanar CT image reconstructions and MIPs were obtained to evaluate the vascular anatomy. Carotid stenosis measurements  (when applicable) are obtained utilizing NASCET criteria, using the distal internal carotid diameter as the denominator. CONTRAST:  50mL ISOVUE-370 IOPAMIDOL (ISOVUE-370) INJECTION 76% COMPARISON:  The of conservatively or FINDINGS: CT HEAD FINDINGS Brain: Subarachnoid hemorrhage within the right middle frontal sulcus is stable. No new areas of hemorrhage are present. Atrophy and white matter disease is noted bilaterally. No mass lesion is present. No significant cortical infarct is present. Vascular: Atherosclerotic calcifications are present within the cavernous internal carotid arteries. There is no hyperdense vessel. Skull: Calvarium is intact. No focal lytic or blastic lesions are present. Asymmetric degenerative changes are present at the right TMJ. Sinuses: The paranasal sinuses and mastoid air cells are clear. Orbits: Left lens replacement is present. Globes and orbits are otherwise within normal limits. Review of the MIP images confirms the above findings CTA NECK FINDINGS Aortic arch: A 3 vessel arch configuration is present. No significant stenosis or aneurysm is present. Right carotid system: The right common carotid artery is within normal limits. Dense atherosclerotic calcifications are present at the bifurcation without a significant stenosis relative to the more distal vessel. Left carotid system: The left common carotid artery is within normal limits. Atherosclerotic calcifications are present at the bifurcation without a significant luminal stenosis relative to the more distal vessel. Vertebral arteries: The vertebral arteries are codominant. Both vertebral arteries originate from the subclavian arteries without a significant stenosis. There is some tortuosity of the V1 segments bilaterally. No significant stenosis or vessel injury is present to either vertebral artery in the neck. Skeleton: Degenerative anterolisthesis is present at C2-3, C3-4, and C4-5. There is chronic loss of disc height with  endplate sclerosis at C5-6 and C6-7. Marked lucency surrounds the roots of the residual right maxillary molar. Dental caries are present without periapical disease. Other neck: The soft tissues the neck are otherwise unremarkable. No significant adenopathy is present. No mucosal lesions are evident. Salivary glands are within normal limits bilaterally. Thyroid is normal. Upper chest: Centrilobular emphysematous changes are present bilaterally. No focal nodule or mass lesion is present. Review of the MIP images confirms the above findings CTA HEAD FINDINGS Anterior circulation: Atherosclerotic calcifications are present within the cavernous internal carotid arteries bilaterally. There is no hyperdense vessel. 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 No definite posterior communicating artery is present. The A1 and M1 segments are normal. The anterior communicating artery is patent. MCA bifurcations are within normal limits bilaterally. The ACA and MCA branch vessels are normal. No focal vascular lesion is evident at the location of the subarachnoid hemorrhage over the right  frontal convexity. Posterior circulation: Vertebral arteries are within normal limits bilaterally. PICA origins are visualized and normal. The basilar artery is normal. Both posterior cerebral arteries originate from the basilar tip. PCA branch vessels are within normal limits bilaterally. Venous sinuses: The dural sinuses are patent. Straight sinus and deep cerebral veins are intact. Cortical veins are normal. Anatomic variants: None Delayed phase: Delayed images demonstrate no pathologic enhancement. White matter disease is accentuated. Review of the MIP images confirms the above findings IMPRESSION: 1. No acute or focal vascular lesion to explain subarachnoid hemorrhage over the convexity of the right frontal lobe. 2. No mass lesion. 3. Stable atrophy and white matter disease. 4. 2 mm aneurysm or  infundibulum at the left posterior communicating artery origin. Recommend follow-up CTA of the head in 1 year to assess stability. This aneurysm does not correspond with the area of subarachnoid hemorrhage. Electronically Signed   By: Marin Roberts M.D.   On: 04/23/2018 19:28   Dg Chest 2 View  Result Date: 04/22/2018 CLINICAL DATA:  Intermittent LEFT arm numbness for 2 weeks, history hypertension, diabetes mellitus, anxiety, former smoker EXAM: CHEST - 2 VIEW COMPARISON:  02/12/2018 FINDINGS: Normal heart size, mediastinal contours, and pulmonary vascularity. Atherosclerotic calcification aorta. Lungs hyperinflated with minimal subsegmental atelectasis at LEFT costophrenic angle. Remaining lungs clear. No acute infiltrate, pleural effusion or pneumothorax. Bones appear demineralized with old healed fracture of the posterolateral RIGHT sixth rib. IMPRESSION: Hyperinflated lungs with minimal subsegmental atelectasis at LEFT costophrenic angle. Electronically Signed   By: Ulyses Southward M.D.   On: 04/22/2018 11:07   Ct Head Wo Contrast  Result Date: 04/22/2018 CLINICAL DATA:  Left arm numbness. EXAM: CT HEAD WITHOUT CONTRAST CT CERVICAL SPINE WITHOUT CONTRAST TECHNIQUE: Multidetector CT imaging of the head and cervical spine was performed following the standard protocol without intravenous contrast. Multiplanar CT image reconstructions of the cervical spine were also generated. COMPARISON:  None. FINDINGS: CT HEAD FINDINGS Brain: Mild diffuse cortical atrophy is noted. Mild chronic ischemic white matter disease is noted. No mass effect or midline shift is noted. Ventricular size is within normal limits. There is no evidence of mass lesion, hemorrhage or acute infarction. Vascular: No hyperdense vessel or unexpected calcification. Skull: Normal. Negative for fracture or focal lesion. Sinuses/Orbits: No acute finding. Other: None. CT CERVICAL SPINE FINDINGS Alignment: Grade 1 anterolisthesis of C3-4 C4-5 is  noted secondary to posterior facet joint hypertrophy. Skull base and vertebrae: No acute fracture. No primary bone lesion or focal pathologic process. Soft tissues and spinal canal: No prevertebral fluid or swelling. No visible canal hematoma. Disc levels: Severe degenerative disc disease is noted at C5-6 and C6-7 with anterior osteophyte formation. Mild degenerative disc disease is noted at C4-5 and C7-T1. Upper chest: Negative. Other: Degenerative changes seen involving posterior facet joints bilaterally, right greater than left. IMPRESSION: Mild diffuse cortical atrophy. Mild chronic ischemic white matter disease. No acute intracranial abnormality seen. Multilevel degenerative disc disease is noted in the cervical spine. No fracture or other acute abnormality is noted. Electronically Signed   By: Lupita Raider, M.D.   On: 04/22/2018 11:09   Ct Angio Neck W Or Wo Contrast  Result Date: 04/23/2018 CLINICAL DATA:  Subarachnoid hemorrhage, proven, follow-up. EXAM: CT ANGIOGRAPHY HEAD AND NECK TECHNIQUE: Multidetector CT imaging of the head and neck was performed using the standard protocol during bolus administration of intravenous contrast. Multiplanar CT image reconstructions and MIPs were obtained to evaluate the vascular anatomy. Carotid stenosis measurements (when applicable) are  obtained utilizing NASCET criteria, using the distal internal carotid diameter as the denominator. CONTRAST:  50mL ISOVUE-370 IOPAMIDOL (ISOVUE-370) INJECTION 76% COMPARISON:  The of conservatively or FINDINGS: CT HEAD FINDINGS Brain: Subarachnoid hemorrhage within the right middle frontal sulcus is stable. No new areas of hemorrhage are present. Atrophy and white matter disease is noted bilaterally. No mass lesion is present. No significant cortical infarct is present. Vascular: Atherosclerotic calcifications are present within the cavernous internal carotid arteries. There is no hyperdense vessel. Skull: Calvarium is intact. No  focal lytic or blastic lesions are present. Asymmetric degenerative changes are present at the right TMJ. Sinuses: The paranasal sinuses and mastoid air cells are clear. Orbits: Left lens replacement is present. Globes and orbits are otherwise within normal limits. Review of the MIP images confirms the above findings CTA NECK FINDINGS Aortic arch: A 3 vessel arch configuration is present. No significant stenosis or aneurysm is present. Right carotid system: The right common carotid artery is within normal limits. Dense atherosclerotic calcifications are present at the bifurcation without a significant stenosis relative to the more distal vessel. Left carotid system: The left common carotid artery is within normal limits. Atherosclerotic calcifications are present at the bifurcation without a significant luminal stenosis relative to the more distal vessel. Vertebral arteries: The vertebral arteries are codominant. Both vertebral arteries originate from the subclavian arteries without a significant stenosis. There is some tortuosity of the V1 segments bilaterally. No significant stenosis or vessel injury is present to either vertebral artery in the neck. Skeleton: Degenerative anterolisthesis is present at C2-3, C3-4, and C4-5. There is chronic loss of disc height with endplate sclerosis at C5-6 and C6-7. Marked lucency surrounds the roots of the residual right maxillary molar. Dental caries are present without periapical disease. Other neck: The soft tissues the neck are otherwise unremarkable. No significant adenopathy is present. No mucosal lesions are evident. Salivary glands are within normal limits bilaterally. Thyroid is normal. Upper chest: Centrilobular emphysematous changes are present bilaterally. No focal nodule or mass lesion is present. Review of the MIP images confirms the above findings CTA HEAD FINDINGS Anterior circulation: Atherosclerotic calcifications are present within the cavernous internal  carotid arteries bilaterally. There is no hyperdense vessel. 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 No definite posterior communicating artery is present. The A1 and M1 segments are normal. The anterior communicating artery is patent. MCA bifurcations are within normal limits bilaterally. The ACA and MCA branch vessels are normal. No focal vascular lesion is evident at the location of the subarachnoid hemorrhage over the right frontal convexity. Posterior circulation: Vertebral arteries are within normal limits bilaterally. PICA origins are visualized and normal. The basilar artery is normal. Both posterior cerebral arteries originate from the basilar tip. PCA branch vessels are within normal limits bilaterally. Venous sinuses: The dural sinuses are patent. Straight sinus and deep cerebral veins are intact. Cortical veins are normal. Anatomic variants: None Delayed phase: Delayed images demonstrate no pathologic enhancement. White matter disease is accentuated. Review of the MIP images confirms the above findings IMPRESSION: 1. No acute or focal vascular lesion to explain subarachnoid hemorrhage over the convexity of the right frontal lobe. 2. No mass lesion. 3. Stable atrophy and white matter disease. 4. 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 Recommend follow-up CTA of the head in 1 year to assess stability. This aneurysm does not correspond with the area of subarachnoid hemorrhage. Electronically Signed   By: Cristal Deer  Mattern M.D.   On: 04/23/2018 19:28   Ct Cervical Spine Wo Contrast  Result Date: 04/22/2018 CLINICAL DATA:  Left arm numbness. EXAM: CT HEAD WITHOUT CONTRAST CT CERVICAL SPINE WITHOUT CONTRAST TECHNIQUE: Multidetector CT imaging of the head and cervical spine was performed following the standard protocol without intravenous contrast. Multiplanar CT image reconstructions of the cervical spine were also  generated. COMPARISON:  None. FINDINGS: CT HEAD FINDINGS Brain: Mild diffuse cortical atrophy is noted. Mild chronic ischemic white matter disease is noted. No mass effect or midline shift is noted. Ventricular size is within normal limits. There is no evidence of mass lesion, hemorrhage or acute infarction. Vascular: No hyperdense vessel or unexpected calcification. Skull: Normal. Negative for fracture or focal lesion. Sinuses/Orbits: No acute finding. Other: None. CT CERVICAL SPINE FINDINGS Alignment: Grade 1 anterolisthesis of C3-4 C4-5 is noted secondary to posterior facet joint hypertrophy. Skull base and vertebrae: No acute fracture. No primary bone lesion or focal pathologic process. Soft tissues and spinal canal: No prevertebral fluid or swelling. No visible canal hematoma. Disc levels: Severe degenerative disc disease is noted at C5-6 and C6-7 with anterior osteophyte formation. Mild degenerative disc disease is noted at C4-5 and C7-T1. Upper chest: Negative. Other: Degenerative changes seen involving posterior facet joints bilaterally, right greater than left. IMPRESSION: Mild diffuse cortical atrophy. Mild chronic ischemic white matter disease. No acute intracranial abnormality seen. Multilevel degenerative disc disease is noted in the cervical spine. No fracture or other acute abnormality is noted. Electronically Signed   By: Lupita Raider, M.D.   On: 04/22/2018 11:09   Mr Brain Wo Contrast (neuro Protocol)  Result Date: 04/22/2018 CLINICAL DATA:  Numbness or tingling, paresthesia. Intermittent left arm numbness for 2 weeks EXAM: MRI HEAD WITHOUT CONTRAST TECHNIQUE: Multiplanar, multiecho pulse sequences of the brain and surrounding structures were obtained without intravenous contrast. COMPARISON:  CT from earlier today FINDINGS: Brain: Small focus of subarachnoid FLAIR hyperintensity and gradient hypointensity along the precentral gyrus on the right, this area is high-density by CT. No other site  of hemorrhage seen, either acute or chronic. There is mild for age chronic small vessel ischemia in the cerebral white matter. Mild cerebral volume loss. Clustered appearance of gyri at the left vertex, nonspecific and likely noncontributory. No underlying mass is seen and there is no extrinsic mass effect by collection. No hydrocephalus. Vascular: Major flow voids are preserved Skull and upper cervical spine: No evidence of marrow lesion. Diffuse cervical facet spurring with mild C3-4 and C4-5 anterolisthesis. Sinuses/Orbits: Left cataract resection. Other: These results were called by telephone at the time of interpretation on 04/22/2018 at 2:10 pm to Dr. Samuel Jester , who verbally acknowledged these results. IMPRESSION: 1. Small volume subarachnoid hemorrhage along the high right frontal convexity. Trauma, coagulopathy, or vasculopathy/amyloid are primary considerations in this location. There is normal superior sagittal sinus flow voids. No acute or remote hemorrhage seen elsewhere. 2. Mild atrophy and chronic small vessel ischemia for age. Electronically Signed   By: Marnee Spring M.D.   On: 04/22/2018 14:14    Microbiology: No results found for this or any previous visit (from the past 240 hour(s)).   Labs: Basic Metabolic Panel: Recent Labs  Lab 04/22/18 1007 04/24/18 0931  NA 138 135  K 3.6 3.6  CL 102 102  CO2 28 25  GLUCOSE 121* 114*  BUN 24* 11  CREATININE 1.12* 0.95  CALCIUM 9.7 9.2   Liver Function Tests: Recent Labs  Lab 04/22/18 1007  AST 30  ALT 17  ALKPHOS 51  BILITOT 0.9  PROT 8.0  ALBUMIN 4.7   No results for input(s): LIPASE, AMYLASE in the last 168 hours. No results for input(s): AMMONIA in the last 168 hours. CBC: Recent Labs  Lab 04/22/18 1007 04/24/18 0931  WBC 6.9 6.3  NEUTROABS 3.5  --   HGB 13.0 12.7  HCT 37.9 38.8  MCV 83.3 84.2  PLT 359 297   Cardiac Enzymes: Recent Labs  Lab 04/22/18 1007  TROPONINI <0.03   BNP: BNP (last 3  results) No results for input(s): BNP in the last 8760 hours.  ProBNP (last 3 results) No results for input(s): PROBNP in the last 8760 hours.  CBG: Recent Labs  Lab 04/23/18 1127  GLUCAP 146*       Signed:  Albertine Grates MD, PhD  [facility] 04/24/2018, 11:49 AM

## 2018-04-24 NOTE — Care Management Note (Signed)
Case Management Note  Patient Details  Name: Vickie Wilson MRN: [id number] Date of Birth: 02/01/1931  Subjective/Objective:                    Action/Plan: Pt discharging home with outpatient therapy. CM provided the patient and daughter choice and [facility] Orders in Epic and information on the AVS. Pt with orders for cane. CM [facility] and he will deliver the equipment to the room.  Daughter to provide transportation home.   Expected Discharge Date:  04/24/18               Expected Discharge Plan:  OP Rehab  In-House Referral:     Discharge planning Services  CM Consult  Post Acute Care Choice:    Choice offered to:  Patient, Adult Children  DME Arranged:  [facility] DME Agency:  [facility]  [facility]    [facility]:     Status of Service:  Completed, signed off  If discussed at Long Length of Stay Meetings, dates discussed:    Additional Comments:  Kermit BaloKelli F Herald Vallin, RN 04/24/2018, 1:03 PM

## 2018-04-24 NOTE — Evaluation (Signed)
Physical Therapy Evaluation Patient Details Name: Vickie Wilson MRN: [id number] DOB: [birth year] Today's Date: 04/24/2018   History of Present Illness  84 y.o.femalepast medical history of hypertension, hyperlipidemia, diabetes mellitus, atrial mass who presents to the emergency room after having multiple spells of numbness over the left hand extending to the arm as well as left facial numbness.  Clinical Impression  Orders received for PT evaluation. Patient demonstrates some deficits in functional mobility as indicated below. Will benefit from continued skilled PT in outpatient setting to address deficits and maximize function. Will defer further needs at this time.    Follow Up Recommendations Outpatient PT;Supervision for mobility/OOB    Equipment Recommendations  Cane    Recommendations for Other Services       Precautions / Restrictions Precautions Precautions: Fall      Mobility  Bed Mobility Overal bed mobility: Modified Independent             General bed mobility comments: increased time to perform  Transfers Overall transfer level: Needs assistance Equipment used: None Transfers: Sit to/from Stand Sit to Stand: Supervision         General transfer comment: intialy supervision but no physical assist required  Ambulation/Gait Ambulation/Gait assistance: Supervision Gait Distance (Feet): 210 Feet Assistive device: None(60 ft with SPC) Gait Pattern/deviations: Step-through pattern;Decreased stride length;Drifts right/left Gait velocity: decreased   General Gait Details: some instability noted with ambulation, no physical assist able to self correct but improvements in stability with use of SPC  Stairs Stairs: Yes Stairs assistance: Supervision Stair Management: One rail Right Number of Stairs: 6 General stair comments: no physical assist required, supervision for safety  Wheelchair Mobility    Modified Rankin (Stroke Patients Only) Modified  Rankin (Stroke Patients Only) Pre-Morbid Rankin Score: No significant disability Modified Rankin: Moderate disability     Balance Overall balance assessment: Needs assistance   Sitting balance-Leahy Scale: Good     Standing balance support: During functional activity Standing balance-Leahy Scale: Fair Standing balance comment: able to stand without assist and perform some dynamic activity                             Pertinent Vitals/Pain Pain Assessment: No/denies pain    Home Living Family/patient expects to be discharged to:: Private residence Living Arrangements: Alone Available Help at Discharge: Family Type of Home: (condo) Home Access: Stairs to enter   Technical brewer of Steps: 2 Home Layout: Two level Home Equipment: None      Prior Function Level of Independence: Independent               Hand Dominance   Dominant Hand: Right    Extremity/Trunk Assessment   Upper Extremity Assessment Upper Extremity Assessment: Generalized weakness    Lower Extremity Assessment Lower Extremity Assessment: Generalized weakness(modest LLE compared to RLE)       Communication   Communication: HOH  Cognition Arousal/Alertness: Awake/alert Behavior During Therapy: WFL for tasks assessed/performed Overall Cognitive Status: Within Functional Limits for tasks assessed                                        General Comments      Exercises     Assessment/Plan    PT Assessment All further PT needs can be met in the next venue of care  PT Problem List Decreased  strength;Decreased activity tolerance;Decreased balance;Decreased mobility       PT Treatment Interventions      PT Goals (Current goals can be found in the Care Plan section)  Acute Rehab PT Goals Patient Stated Goal: to go home PT Goal Formulation: All assessment and education complete, DC therapy    Frequency     Barriers to discharge         Co-evaluation               AM-PAC PT "6 Clicks" Daily Activity  Outcome Measure Difficulty turning over in bed (including adjusting bedclothes, sheets and blankets)?: A Little Difficulty moving from lying on back to sitting on the side of the bed? : A Little Difficulty sitting down on and standing up from a chair with arms (e.g., wheelchair, bedside commode, etc,.)?: A Little Help needed moving to and from a bed to chair (including a wheelchair)?: A Little Help needed walking in hospital room?: A Little Help needed climbing 3-5 steps with a railing? : A Little 6 Click Score: 18    End of Session Equipment Utilized During Treatment: Gait belt Activity Tolerance: Patient tolerated treatment well Patient left: in bed;with call bell/phone within reach;with family/visitor present(sitting EOB) Nurse Communication: Mobility status PT Visit Diagnosis: Difficulty in walking, not elsewhere classified (R26.2)    Time: 6269-4854 PT Time Calculation (min) (ACUTE ONLY): 22 min   Charges:   PT Evaluation $PT Eval Moderate Complexity: 1 Mod          Alben Deeds, PT DPT  Board Certified Neurologic Specialist [place] 04/24/2018, 12:24 PM

## 2018-04-24 NOTE — Progress Notes (Signed)
Neurosurgery Progress Note  No issues overnight.  No further episodes of left upper extremity numbness  EXAM:  BP (!) 146/72 (BP Location: Right Arm)   Pulse 65   Temp 97.9 F (36.6 C) (Oral)   Resp 16   Ht 5\' 1"  (1.549 m)   Wt 50.6 kg   SpO2 97%   BMI 21.08 kg/m   Awake, alert, oriented  Speech fluent, appropriate  CN grossly intact  5/5 BUE/BLE   PLAN Stable this am No further episodes of numbness. Based on normal CTA (with exception of aneurysm vs infundibulum which is unrelated) will assume most likely seizure as etiology of LUE symptoms. Continue keppra. F/U outpt in 4 weeks for monitoring of SAH and for CTA findings.

## 2018-04-24 NOTE — Progress Notes (Addendum)
NEURO HOSPITALIST PROGRESS NOTE   Subjective: Patient awake. Alert, eyes open in bed, NAD on RA. No reports of left arm numbness.   Exam: Vitals:   04/24/18 0335 04/24/18 0734  BP: 131/69 139/77  Pulse: 70 65  Resp: 16 16  Temp: (!) 97.5 F (36.4 C) 97.9 F (36.6 C)  SpO2: 99% 97%    Physical Exam   HEENT-  Normocephalic, no lesions, without obvious abnormality.  Normal external eye and conjunctiva.   Cardiovascular- S1-S2 audible, pulses palpable throughout   Lungs-no rhonchi or wheezing noted, no excessive working breathing.  Saturations within normal limits on RA. Abdomen- All 4 quadrants palpated and nontender, BS present x 4 Extremities- Warm, dry and intact Musculoskeletal-no joint tenderness, deformity or swelling Skin-warm and dry, no hyperpigmentation, vitiligo, or suspicious lesions   Neuro:  Mental Status: Alert, oriented, to age/name/hospital/situation/ month/ year/ city/ state, thought content appropriate.  Speech fluent without evidence of aphasia.  Able to follow  commands without difficulty. Cranial Nerves: II:  Visual fields grossly normal,  III,IV, VI: ptosis not present, extra-ocular motions intact bilaterally pupils equal, round, reactive to light and accommodation V,VII: smile symmetric, facial light touch sensation normal bilaterally VIII: hearing normal bilaterally IX,X: uvula rises symmetrically XI: bilateral shoulder shrug XII: midline tongue extension Motor: Right : Upper extremity   5/5    Left:     Upper extremity   5/5  Lower extremity   5/5     Lower extremity   5/5 Tone and bulk:normal tone throughout; no atrophy noted Sensory:  light touch intact throughout, bilaterally Deep Tendon Reflexes: 2+ and symmetric biceps, patella Plantars: Right: downgoing   Left: downgoing Cerebellar: normal finger-to-nose,  normal heel-to-shin test Gait: deferred    Medications:  Scheduled: . amLODipine  10 mg Oral Daily  .  atenolol  50 mg Oral Daily  . levETIRAcetam  500 mg Oral BID  . LORazepam  0.5-1 mg Intravenous Once  . multivitamin with minerals  1 tablet Oral Q M,W,F  . PARoxetine  5 mg Oral q morning - 10a  . rosuvastatin  10 mg Oral QPM  . senna-docusate  2 tablet Oral QHS  . sodium chloride flush  3 mL Intravenous Q12H   Continuous: . sodium chloride     ZOX:WRUEAV chloride, acetaminophen **OR** acetaminophen, albuterol, ondansetron **OR** ondansetron (ZOFRAN) IV, polyethylene glycol, sodium chloride flush  Pertinent Labs/Diagnostics:   Ct Angio Head W Or Wo Contrast  Result Date: 04/23/2018 CLINICAL DATA:  Subarachnoid hemorrhage, proven, follow-up. EXAM: CT ANGIOGRAPHY HEAD AND NECK TECHNIQUE: Multidetector CT imaging of the head and neck was performed using the standard protocol during bolus administration of intravenous contrast. Multiplanar CT image reconstructions and MIPs were obtained to evaluate the vascular anatomy. Carotid stenosis measurements (when applicable) are obtained utilizing NASCET criteria, using the distal internal carotid diameter as the denominator. CONTRAST:  50mL ISOVUE-370 IOPAMIDOL (ISOVUE-370) INJECTION 76% COMPARISON:  The of conservatively or FINDINGS: CT HEAD FINDINGS Brain: Subarachnoid hemorrhage within the right middle frontal sulcus is stable. No new areas of hemorrhage are present. Atrophy and white matter disease is noted bilaterally. No mass lesion is present. No significant cortical infarct is present. Vascular: Atherosclerotic calcifications are present within the cavernous internal carotid arteries. There is no hyperdense vessel. Skull: Calvarium is intact. No focal lytic or blastic lesions are present. Asymmetric degenerative changes are present at the  right TMJ. Sinuses: The paranasal sinuses and mastoid air cells are clear. Orbits: Left lens replacement is present. Globes and orbits are otherwise within normal limits. Review of the MIP images confirms the above  findings CTA NECK FINDINGS Aortic arch: A 3 vessel arch configuration is present. No significant stenosis or aneurysm is present. Right carotid system: The right common carotid artery is within normal limits. Dense atherosclerotic calcifications are present at the bifurcation without a significant stenosis relative to the more distal vessel. Left carotid system: The left common carotid artery is within normal limits. Atherosclerotic calcifications are present at the bifurcation without a significant luminal stenosis relative to the more distal vessel. Vertebral arteries: The vertebral arteries are codominant. Both vertebral arteries originate from the subclavian arteries without a significant stenosis. There is some tortuosity of the V1 segments bilaterally. No significant stenosis or vessel injury is present to either vertebral artery in the neck. Skeleton: Degenerative anterolisthesis is present at C2-3, C3-4, and C4-5. There is chronic loss of disc height with endplate sclerosis at C5-6 and C6-7. Marked lucency surrounds the roots of the residual right maxillary molar. Dental caries are present without periapical disease. Other neck: The soft tissues the neck are otherwise unremarkable. No significant adenopathy is present. No mucosal lesions are evident. Salivary glands are within normal limits bilaterally. Thyroid is normal. Upper chest: Centrilobular emphysematous changes are present bilaterally. No focal nodule or mass lesion is present. Review of the MIP images confirms the above findings CTA HEAD FINDINGS Anterior circulation: Atherosclerotic calcifications are present within the cavernous internal carotid arteries bilaterally. There is no hyperdense vessel. 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 No definite posterior communicating artery is present. The A1 and M1 segments are normal. The anterior communicating artery is patent. MCA bifurcations are within normal  limits bilaterally. The ACA and MCA branch vessels are normal. No focal vascular lesion is evident at the location of the subarachnoid hemorrhage over the right frontal convexity. Posterior circulation: Vertebral arteries are within normal limits bilaterally. PICA origins are visualized and normal. The basilar artery is normal. Both posterior cerebral arteries originate from the basilar tip. PCA branch vessels are within normal limits bilaterally. Venous sinuses: The dural sinuses are patent. Straight sinus and deep cerebral veins are intact. Cortical veins are normal. Anatomic variants: None Delayed phase: Delayed images demonstrate no pathologic enhancement. White matter disease is accentuated. Review of the MIP images confirms the above findings IMPRESSION: 1. No acute or focal vascular lesion to explain subarachnoid hemorrhage over the convexity of the right frontal lobe. 2. No mass lesion. 3. Stable atrophy and white matter disease. 4. 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 Recommend follow-up CTA of the head in 1 year to assess stability. This aneurysm does not correspond with the area of subarachnoid hemorrhage. Electronically Signed   By: Marin Robertshristopher  Mattern M.D.   On: 04/23/2018 19:28   Dg Chest 2 View  Result Date: 04/22/2018 CLINICAL DATA:  Intermittent LEFT arm numbness for 2 weeks, history hypertension, diabetes mellitus, anxiety, former smoker EXAM: CHEST - 2 VIEW COMPARISON:  02/12/2018 FINDINGS: Normal heart size, mediastinal contours, and pulmonary vascularity. Atherosclerotic calcification aorta. Lungs hyperinflated with minimal subsegmental atelectasis at LEFT costophrenic angle. Remaining lungs clear. No acute infiltrate, pleural effusion or pneumothorax. Bones appear demineralized with old healed fracture of the posterolateral RIGHT sixth rib. IMPRESSION: Hyperinflated lungs with minimal subsegmental atelectasis at LEFT costophrenic angle.  Electronically Signed  By: Ulyses Southward M.D.   On: 04/22/2018 11:07   Ct Head Wo Contrast  Result Date: 04/22/2018 CLINICAL DATA:  Left arm numbness. EXAM: CT HEAD WITHOUT CONTRAST CT CERVICAL SPINE WITHOUT CONTRAST TECHNIQUE: Multidetector CT imaging of the head and cervical spine was performed following the standard protocol without intravenous contrast. Multiplanar CT image reconstructions of the cervical spine were also generated. COMPARISON:  None. FINDINGS: CT HEAD FINDINGS Brain: Mild diffuse cortical atrophy is noted. Mild chronic ischemic white matter disease is noted. No mass effect or midline shift is noted. Ventricular size is within normal limits. There is no evidence of mass lesion, hemorrhage or acute infarction. Vascular: No hyperdense vessel or unexpected calcification. Skull: Normal. Negative for fracture or focal lesion. Sinuses/Orbits: No acute finding. Other: None. CT CERVICAL SPINE FINDINGS Alignment: Grade 1 anterolisthesis of C3-4 C4-5 is noted secondary to posterior facet joint hypertrophy. Skull base and vertebrae: No acute fracture. No primary bone lesion or focal pathologic process. Soft tissues and spinal canal: No prevertebral fluid or swelling. No visible canal hematoma. Disc levels: Severe degenerative disc disease is noted at C5-6 and C6-7 with anterior osteophyte formation. Mild degenerative disc disease is noted at C4-5 and C7-T1. Upper chest: Negative. Other: Degenerative changes seen involving posterior facet joints bilaterally, right greater than left. IMPRESSION: Mild diffuse cortical atrophy. Mild chronic ischemic white matter disease. No acute intracranial abnormality seen. Multilevel degenerative disc disease is noted in the cervical spine. No fracture or other acute abnormality is noted. Electronically Signed   By: Lupita Raider, M.D.   On: 04/22/2018 11:09   Ct Angio Neck W Or Wo Contrast  Result Date: 04/23/2018 CLINICAL DATA:  Subarachnoid hemorrhage, proven,  follow-up. EXAM: CT ANGIOGRAPHY HEAD AND NECK TECHNIQUE: Multidetector CT imaging of the head and neck was performed using the standard protocol during bolus administration of intravenous contrast. Multiplanar CT image reconstructions and MIPs were obtained to evaluate the vascular anatomy. Carotid stenosis measurements (when applicable) are obtained utilizing NASCET criteria, using the distal internal carotid diameter as the denominator. CONTRAST:  50mL ISOVUE-370 IOPAMIDOL (ISOVUE-370) INJECTION 76% COMPARISON:  The of conservatively or FINDINGS: CT HEAD FINDINGS Brain: Subarachnoid hemorrhage within the right middle frontal sulcus is stable. No new areas of hemorrhage are present. Atrophy and white matter disease is noted bilaterally. No mass lesion is present. No significant cortical infarct is present. Vascular: Atherosclerotic calcifications are present within the cavernous internal carotid arteries. There is no hyperdense vessel. Skull: Calvarium is intact. No focal lytic or blastic lesions are present. Asymmetric degenerative changes are present at the right TMJ. Sinuses: The paranasal sinuses and mastoid air cells are clear. Orbits: Left lens replacement is present. Globes and orbits are otherwise within normal limits. Review of the MIP images confirms the above findings CTA NECK FINDINGS Aortic arch: A 3 vessel arch configuration is present. No significant stenosis or aneurysm is present. Right carotid system: The right common carotid artery is within normal limits. Dense atherosclerotic calcifications are present at the bifurcation without a significant stenosis relative to the more distal vessel. Left carotid system: The left common carotid artery is within normal limits. Atherosclerotic calcifications are present at the bifurcation without a significant luminal stenosis relative to the more distal vessel. Vertebral arteries: The vertebral arteries are codominant. Both vertebral arteries originate from  the subclavian arteries without a significant stenosis. There is some tortuosity of the V1 segments bilaterally. No significant stenosis or vessel injury is present to either vertebral artery in the  neck. Skeleton: Degenerative anterolisthesis is present at C2-3, C3-4, and C4-5. There is chronic loss of disc height with endplate sclerosis at C5-6 and C6-7. Marked lucency surrounds the roots of the residual right maxillary molar. Dental caries are present without periapical disease. Other neck: The soft tissues the neck are otherwise unremarkable. No significant adenopathy is present. No mucosal lesions are evident. Salivary glands are within normal limits bilaterally. Thyroid is normal. Upper chest: Centrilobular emphysematous changes are present bilaterally. No focal nodule or mass lesion is present. Review of the MIP images confirms the above findings CTA HEAD FINDINGS Anterior circulation: Atherosclerotic calcifications are present within the cavernous internal carotid arteries bilaterally. There is no hyperdense vessel. 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 No definite posterior communicating artery is present. The A1 and M1 segments are normal. The anterior communicating artery is patent. MCA bifurcations are within normal limits bilaterally. The ACA and MCA branch vessels are normal. No focal vascular lesion is evident at the location of the subarachnoid hemorrhage over the right frontal convexity. Posterior circulation: Vertebral arteries are within normal limits bilaterally. PICA origins are visualized and normal. The basilar artery is normal. Both posterior cerebral arteries originate from the basilar tip. PCA branch vessels are within normal limits bilaterally. Venous sinuses: The dural sinuses are patent. Straight sinus and deep cerebral veins are intact. Cortical veins are normal. Anatomic variants: None Delayed phase: Delayed images demonstrate no pathologic  enhancement. White matter disease is accentuated. Review of the MIP images confirms the above findings IMPRESSION: 1. No acute or focal vascular lesion to explain subarachnoid hemorrhage over the convexity of the right frontal lobe. 2. No mass lesion. 3. Stable atrophy and white matter disease. 4. 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 Recommend follow-up CTA of the head in 1 year to assess stability. This aneurysm does not correspond with the area of subarachnoid hemorrhage. Electronically Signed   By: Marin Roberts M.D.   On: 04/23/2018 19:28   Ct Cervical Spine Wo Contrast  Result Date: 04/22/2018 CLINICAL DATA:  Left arm numbness. EXAM: CT HEAD WITHOUT CONTRAST CT CERVICAL SPINE WITHOUT CONTRAST TECHNIQUE: Multidetector CT imaging of the head and cervical spine was performed following the standard protocol without intravenous contrast. Multiplanar CT image reconstructions of the cervical spine were also generated. COMPARISON:  None. FINDINGS: CT HEAD FINDINGS Brain: Mild diffuse cortical atrophy is noted. Mild chronic ischemic white matter disease is noted. No mass effect or midline shift is noted. Ventricular size is within normal limits. There is no evidence of mass lesion, hemorrhage or acute infarction. Vascular: No hyperdense vessel or unexpected calcification. Skull: Normal. Negative for fracture or focal lesion. Sinuses/Orbits: No acute finding. Other: None. CT CERVICAL SPINE FINDINGS Alignment: Grade 1 anterolisthesis of C3-4 C4-5 is noted secondary to posterior facet joint hypertrophy. Skull base and vertebrae: No acute fracture. No primary bone lesion or focal pathologic process. Soft tissues and spinal canal: No prevertebral fluid or swelling. No visible canal hematoma. Disc levels: Severe degenerative disc disease is noted at C5-6 and C6-7 with anterior osteophyte formation. Mild degenerative disc disease is noted at C4-5 and C7-T1. Upper chest:  Negative. Other: Degenerative changes seen involving posterior facet joints bilaterally, right greater than left. IMPRESSION: Mild diffuse cortical atrophy. Mild chronic ischemic white matter disease. No acute intracranial abnormality seen. Multilevel degenerative disc disease is noted in the cervical spine. No fracture or other acute abnormality is noted. Electronically Signed  By: Lupita Raider, M.D.   On: 04/22/2018 11:09   Mr Brain Wo Contrast (neuro Protocol)  Result Date: 04/22/2018 CLINICAL DATA:  Numbness or tingling, paresthesia. Intermittent left arm numbness for 2 weeks EXAM: MRI HEAD WITHOUT CONTRAST TECHNIQUE: Multiplanar, multiecho pulse sequences of the brain and surrounding structures were obtained without intravenous contrast. COMPARISON:  CT from earlier today FINDINGS: Brain: Small focus of subarachnoid FLAIR hyperintensity and gradient hypointensity along the precentral gyrus on the right, this area is high-density by CT. No other site of hemorrhage seen, either acute or chronic. There is mild for age chronic small vessel ischemia in the cerebral white matter. Mild cerebral volume loss. Clustered appearance of gyri at the left vertex, nonspecific and likely noncontributory. No underlying mass is seen and there is no extrinsic mass effect by collection. No hydrocephalus. Vascular: Major flow voids are preserved Skull and upper cervical spine: No evidence of marrow lesion. Diffuse cervical facet spurring with mild C3-4 and C4-5 anterolisthesis. Sinuses/Orbits: Left cataract resection. Other: These results were called by telephone at the time of interpretation on 04/22/2018 at 2:10 pm to Dr. Samuel Jester , who verbally acknowledged these results. IMPRESSION: 1. Small volume subarachnoid hemorrhage along the high right frontal convexity. Trauma, coagulopathy, or vasculopathy/amyloid are primary considerations in this location. There is normal superior sagittal sinus flow voids. No acute or  remote hemorrhage seen elsewhere. 2. Mild atrophy and chronic small vessel ischemia for age. Electronically Signed   By: Marnee Spring M.D.   On: 04/22/2018 14:14   Routine EEG 04/23/18: This normal EEG is recorded in the waking state. There was no seizure or seizure predisposition recorded on this study. Please note that a normal EEG does not preclude the possibility of epilepsy.  Transthoracic Echo 04/23/18: Left ventricle: The cavity size was normal. Wall thickness was   normal. Systolic function was normal. The estimated ejection   fraction was in the range of 55% to 60%. Wall motion was normal;   there were no regional wall motion abnormalities. Doppler   parameters are consistent with abnormal left ventricular   relaxation (grade 1 diastolic dysfunction). - Aortic valve: Valve area (VTI): 2.42 cm^2. Valve area (Vmax):   2.38 cm^2. Valve area (Vmean): 2.23 cm^2.   Impression: 84 y.o. female past medical history of hypertension, hyperlipidemia, diabetes mellitus, atrial mass who presents to the emergency room after having multiple spells of numbness over the left hand extending to the arm as well as left facial numbness. Subarachnoid hemorrhage in the right frontal cortex on MRI imaging.  Given several hyperdensity on CT may represent a subacute bleed.  This is not aneurysmal and I suspect is traumatic.  No concerns for sinus venous thrombosis seen on MRI.  She was evaluated by neurosurgery who recommended a CTA head and neck. CTA Head and neck: No acute or focal vascular lesion to explain subarachnoid hemorrhage. No mass lesion, stable atrophy and white matter disease. 2 mm aneurysm or infundibulum at left PCA. They recommend f./u CTA in 1 year.   I suspect that this subarachnoid may be irritating the cortex, and I agree with starting Keppra at 500 mg BID. One other possibility would be that she has 2 separate pathologies at play, E.G. carotid stenosis or some other focal abnormality causing  intermittent hypoperfusion.  If  CTA head does not show a clear area of stenosis leading to sensory cortex on the left than I would favor continuing Keppra seeing if it improves her left arm  numbness as an outpatient.   Recommendations:  --continue keppra 500 mg BID --continue seizure precautions  --No ASA --f/u with outpatient neurology -Neurology will sign off at this time. Please call with questions.  Valentina Lucks, MSN, NP-C [facility] [phone]  Attending neurologist's note to follow  I have seen the patient reviewed the note.  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  She has not had any further episodes of numbness since starting Keppra.  I would favor continuing this and having her follow-up with outpatient neurology.  Ritta Slot, MD [facility] [phone]  If 7pm- 7am, please page neurology on call as listed in [facility]ION.

## 2018-04-25 ENCOUNTER — Telehealth: Payer: Self-pay | Admitting: *Deleted

## 2018-04-25 NOTE — Telephone Encounter (Signed)
Transition Care Management Follow-up Telephone Call   Date discharged? 04/24/18   How have you been since you were released from the hospital? Pt states she is doing ok   Do you understand why you were in the hospital? YES   Do you understand the discharge instructions? YES   Where were you discharged to? Home   Items Reviewed:  Medications reviewed: YES  Allergies reviewed: YES  Dietary changes reviewed: YES, heart healthy  Referrals reviewed: YES   Functional Questionnaire:   Activities of Daily Living (ADLs):   She states she are independent in the following: bathing and hygiene, feeding, continence, grooming, toileting and dressing States they require assistance with the following: ambulation   Any transportation issues/concerns?: NO   Any patient concerns? NO   Confirmed importance and date/time of follow-up visits scheduled YES, appt 05/02/18  Provider Appointment booked with Dr. Posey ReaPlotnikov  Confirmed with patient if condition begins to worsen call PCP or go to the ER.  Patient was given the office number and encouraged to call back with question or concerns.  : YES

## 2018-04-26 ENCOUNTER — Other Ambulatory Visit (HOSPITAL_COMMUNITY): Payer: Medicare Other

## 2018-04-26 ENCOUNTER — Telehealth: Payer: Self-pay | Admitting: Internal Medicine

## 2018-04-26 NOTE — Telephone Encounter (Signed)
Copied from CRM [phone]. Topic: Quick Communication - See Telephone Encounter >> Apr 26, 2018  8:31 AM Jolayne Hainesaylor, Brittany L wrote: CRM for notification. See Telephone encounter for: 04/26/18.  Patient states she was discharged from the hospital on Sunday 8/11. Patient said she was advised to monitor her blood pressure. Patient would like to know which machine is the best one to get to monitor this.

## 2018-04-27 MED ORDER — BLOOD PRESSURE MONITOR/PRM ARM DEVI: 1.0000 | Freq: Once | 0 refills | Status: DC

## 2018-04-27 MED ORDER — BLOOD PRESSURE MONITOR/PRM ARM DEVI: 1.0000 | Freq: Once | 0 refills | Status: AC

## 2018-04-27 NOTE — Telephone Encounter (Signed)
Called pt no answer LMOM best BP machine are the ones that are on your arm.Marland Kitchen. Not wrist. [place] [facility] [place] [facility] [place] [facility] [place] sure if insurance will cover. Also reminded pt to keep appt for 05/02/18.Marland Kitchen.[name]/lmb

## 2018-04-27 NOTE — Telephone Encounter (Signed)
Faxed rx through [facility].Marland Kitchen.[name]/lmb

## 2018-04-27 NOTE — Addendum Note (Signed)
Addended by: Deatra JamesBRAND, Zayley Arras M on: 04/27/2018 09:36 AM   Modules accepted: Orders

## 2018-05-02 ENCOUNTER — Ambulatory Visit (INDEPENDENT_AMBULATORY_CARE_PROVIDER_SITE_OTHER): Payer: Medicare Other | Admitting: Internal Medicine

## 2018-05-02 ENCOUNTER — Encounter: Payer: Self-pay | Admitting: Internal Medicine

## 2018-05-02 DIAGNOSIS — E871 Hypo-osmolality and hyponatremia: Secondary | ICD-10-CM

## 2018-05-02 DIAGNOSIS — I609 Nontraumatic subarachnoid hemorrhage, unspecified: Secondary | ICD-10-CM | POA: Diagnosis not present

## 2018-05-02 DIAGNOSIS — F411 Generalized anxiety disorder: Secondary | ICD-10-CM

## 2018-05-02 DIAGNOSIS — I10 Essential (primary) hypertension: Secondary | ICD-10-CM

## 2018-05-02 NOTE — Progress Notes (Signed)
Subjective:  Patient ID: Vickie Wilson, female    DOB: 11/09/1930  Age: 84 y.o. MRN: [id number]  CC: No chief complaint on file.   HPI Vickie Wilson presents for [facility], HTN, dyslipidemia f/u- she was d/c'd on 8/11. C/o legs feeling weak  Per hx:  "Discharge Diagnoses:      Active Hospital Problems   Diagnosis Date Noted  . SAH (subarachnoid hemorrhage) (HCC) 04/22/2018  . Anxiety state 06/29/2007    Resolved Hospital Problems  No resolved problems to display.    Discharge Condition: stable  Diet recommendation: heart healthy     Filed Weights   04/22/18 2051  Weight: 50.6 kg    History of present illness: ( per admitting MD Dr Mariea Clonts) Fayrene Fearing a87 y.o.femalewith past medical history history relevant for history of TIAs, dyslipidemia, hypertension, anxiety and depressive disorder since to the ED with complaints of left arm as well as facial numbness on and off for over a week now, patient was seen by PCP Dr. Posey Rea on 04/15/2018.Symptoms resolved after that visit but then did be more persistent lately patient had EMS to the hospital recently symptoms improved when EMS arrived so she was not transported to the ED but today symptoms became more persistent so patient came to the ED  In ED---CT head was unremarkable, however MRI brain showed small volume subarachnoid hemorrhage, ED provider discussed this case with on-call neurologist Dr. Onalee Hua, who advised transfer to [facility] for further neurology evaluation.  Patient granddaughteranddaughter at bedside, additional history obtained both of them, patient denies any history of significant falls or head injury recently.  Patient is not on anyanticoagulants, denies excessive NSAID use,she was taking aspirin 162 mg daily  Patient denies significant alcohol intake or use of any mind altering substances  Small SAH on brain MRI, patient is clinically and neurologically  stable, ED provider discussed case with on-call neurologist Dr. Kirstie Peri, I discussed this case with on-call neurosurgical PA Mr. Cindra Presume Official neurology and official neurosurgical consults are pending  Hospital Course:  Principal Problem:   SAH (subarachnoid hemorrhage) (HCC) Active Problems:   Anxiety state   Subarachnoid hemorrhage in the right frontal cortex -she lives by herself, independent of ADLs, still drives -she denies trauma -She was evaluated by neurosurgery who recommended a CTA head and neck, please see below --TTE lvef wnl, grade 1 diastolic dysfunction, no cardiac emolism -Tele unremarkable -basic labs unremarkable -EEG "This normal EEG is recorded in the waking state. There was no seizure or seizure predisposition recorded on this study. Please note that a normal EEG does not preclude the possibility of epilepsy. 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 Recommend follow-up CTA of the head in 1 year to assess stability. This aneurysm does not correspond with the area of subarachnoid hemorrhage. F/u with neurosurgery in 4 weeks.  PT recommended outpatient PT.  HTN/HLD/diet controlled dm2 Stable on norvasc/atenolol/statin Asa held  CKDIII Renal function at baseline"   Outpatient Medications Prior to Visit  Medication Sig Dispense Refill  . amLODipine (NORVASC) 5 MG tablet TAKE 1 TABLET ONCE DAILY. 90 tablet 3  . atenolol (TENORMIN) 100 MG tablet TAKE (1/2) TABLET TWICE DAILY. (Patient taking differently: Take 50 mg by mouth 2 (two) times daily. ) 30 tablet 3  . levETIRAcetam (  KEPPRA) 500 MG tablet Take 1 tablet (500 mg total) by mouth 2 (two) times daily. 60 tablet 0  . Multiple Vitamins-Minerals (CENTRUM SILVER) CHEW Chew 1 each  by mouth 3 (three) times a week.     Marland Kitchen. PARoxetine (PAXIL) 10 MG tablet TAKE 1 TABLET IN THE MORNING. (Patient taking differently: Take 5 mg by mouth daily. ) 90 tablet 1  . rosuvastatin (CRESTOR) 20 MG tablet Take 0.5 tablets (10 mg total) by mouth daily. (Patient taking differently: Take 10 mg by mouth every evening. ) 45 tablet 3  . sennosides-docusate sodium (SENOKOT-S) 8.6-50 MG tablet Take 2 tablets by mouth at bedtime.     No facility-administered medications prior to visit.     ROS: Review of Systems  Constitutional: Positive for fatigue. Negative for activity change, appetite change, chills and unexpected weight change.  HENT: Negative for congestion, mouth sores and sinus pressure.   Eyes: Negative for visual disturbance.  Respiratory: Negative for cough and chest tightness.   Gastrointestinal: Negative for abdominal pain and nausea.  Genitourinary: Negative for difficulty urinating, frequency and vaginal pain.  Musculoskeletal: Negative for back pain and gait problem.  Skin: Negative for pallor and rash.  Neurological: Positive for weakness and numbness. Negative for dizziness, tremors and headaches.  Psychiatric/Behavioral: Negative for confusion, sleep disturbance and suicidal ideas. The patient is nervous/anxious.     Objective:  BP 134/82 (BP Location: Left Arm, Patient Position: Sitting, Cuff Size: Normal)   Pulse (!) 51   Temp 98.1 F (36.7 C) (Oral)   Ht 5\' 1"  (1.549 m)   Wt 109 lb (49.4 kg)   SpO2 96%   BMI 20.60 kg/m   BP Readings from Last 3 Encounters:  05/02/18 134/82  04/24/18 110/70  04/15/18 132/78    Wt Readings from Last 3 Encounters:  05/02/18 109 lb (49.4 kg)  04/22/18 111 lb 8.8 oz (50.6 kg)  04/15/18 112 lb (50.8 kg)    Physical Exam  Constitutional: She appears well-developed. No distress.  HENT:  Head: Normocephalic.  Right Ear: External ear normal.  Left Ear: External ear normal.  Nose: Nose normal.  Mouth/Throat: Oropharynx is  clear and moist.  Eyes: Pupils are equal, round, and reactive to light. Conjunctivae are normal. Right eye exhibits no discharge. Left eye exhibits no discharge.  Neck: Normal range of motion. Neck supple. No JVD present. No tracheal deviation present. No thyromegaly present.  Cardiovascular: Normal rate, regular rhythm and normal heart sounds.  Pulmonary/Chest: No stridor. No respiratory distress. She has no wheezes.  Abdominal: Soft. Bowel sounds are normal. She exhibits no distension and no mass. There is no tenderness. There is no rebound and no guarding.  Musculoskeletal: She exhibits no edema or tenderness.  Lymphadenopathy:    She has no cervical adenopathy.  Neurological: She displays normal reflexes. No cranial nerve deficit. She exhibits normal muscle tone. Coordination abnormal.  Skin: No rash noted. No erythema.  Psychiatric: She has a normal mood and affect. Her behavior is normal. Judgment and thought content normal.  slightly ataxic No pronator drift  Lab Results  Component Value Date   WBC 6.3 04/24/2018   HGB 12.7 04/24/2018   HCT 38.8 04/24/2018   PLT 297 04/24/2018   GLUCOSE 114 (H) 04/24/2018   CHOL 184 02/23/2017   TRIG 80.0 02/23/2017   HDL 61.60 02/23/2017   LDLDIRECT 125.6 01/16/2013   LDLCALC 106 (H) 02/23/2017   ALT 17 04/22/2018   AST 30 04/22/2018   NA 135 04/24/2018  K 3.6 04/24/2018   CL 102 04/24/2018   CREATININE 0.95 04/24/2018   BUN 11 04/24/2018   CO2 25 04/24/2018   TSH 3.48 03/02/2018   INR 0.92 04/22/2018   HGBA1C 6.3 03/02/2018   MICROALBUR 2.8 (H) 09/17/2016    Ct Angio Head W Or Wo Contrast  Result Date: 04/23/2018 CLINICAL DATA:  Subarachnoid hemorrhage, proven, follow-up. EXAM: CT ANGIOGRAPHY HEAD AND NECK TECHNIQUE: Multidetector CT imaging of the head and neck was performed using the standard protocol during bolus administration of intravenous contrast. Multiplanar CT image reconstructions and MIPs were obtained to evaluate the  vascular anatomy. Carotid stenosis measurements (when applicable) are obtained utilizing NASCET criteria, using the distal internal carotid diameter as the denominator. CONTRAST:  50mL ISOVUE-370 IOPAMIDOL (ISOVUE-370) INJECTION 76% COMPARISON:  The of conservatively or FINDINGS: CT HEAD FINDINGS Brain: Subarachnoid hemorrhage within the right middle frontal sulcus is stable. No new areas of hemorrhage are present. Atrophy and white matter disease is noted bilaterally. No mass lesion is present. No significant cortical infarct is present. Vascular: Atherosclerotic calcifications are present within the cavernous internal carotid arteries. There is no hyperdense vessel. Skull: Calvarium is intact. No focal lytic or blastic lesions are present. Asymmetric degenerative changes are present at the right TMJ. Sinuses: The paranasal sinuses and mastoid air cells are clear. Orbits: Left lens replacement is present. Globes and orbits are otherwise within normal limits. Review of the MIP images confirms the above findings CTA NECK FINDINGS Aortic arch: A 3 vessel arch configuration is present. No significant stenosis or aneurysm is present. Right carotid system: The right common carotid artery is within normal limits. Dense atherosclerotic calcifications are present at the bifurcation without a significant stenosis relative to the more distal vessel. Left carotid system: The left common carotid artery is within normal limits. Atherosclerotic calcifications are present at the bifurcation without a significant luminal stenosis relative to the more distal vessel. Vertebral arteries: The vertebral arteries are codominant. Both vertebral arteries originate from the subclavian arteries without a significant stenosis. There is some tortuosity of the V1 segments bilaterally. No significant stenosis or vessel injury is present to either vertebral artery in the neck. Skeleton: Degenerative anterolisthesis is present at C2-3, C3-4, and  C4-5. There is chronic loss of disc height with endplate sclerosis at C5-6 and C6-7. Marked lucency surrounds the roots of the residual right maxillary molar. Dental caries are present without periapical disease. Other neck: The soft tissues the neck are otherwise unremarkable. No significant adenopathy is present. No mucosal lesions are evident. Salivary glands are within normal limits bilaterally. Thyroid is normal. Upper chest: Centrilobular emphysematous changes are present bilaterally. No focal nodule or mass lesion is present. Review of the MIP images confirms the above findings CTA HEAD FINDINGS Anterior circulation: Atherosclerotic calcifications are present within the cavernous internal carotid arteries bilaterally. There is no hyperdense vessel. 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 No definite posterior communicating artery is present. The A1 and M1 segments are normal. The anterior communicating artery is patent. MCA bifurcations are within normal limits bilaterally. The ACA and MCA branch vessels are normal. No focal vascular lesion is evident at the location of the subarachnoid hemorrhage over the right frontal convexity. Posterior circulation: Vertebral arteries are within normal limits bilaterally. PICA origins are visualized and normal. The basilar artery is normal. Both posterior cerebral arteries originate from the basilar tip. PCA branch vessels are within normal limits bilaterally. Venous sinuses: The dural sinuses are patent.  Straight sinus and deep cerebral veins are intact. Cortical veins are normal. Anatomic variants: None Delayed phase: Delayed images demonstrate no pathologic enhancement. White matter disease is accentuated. Review of the MIP images confirms the above findings IMPRESSION: 1. No acute or focal vascular lesion to explain subarachnoid hemorrhage over the convexity of the right frontal lobe. 2. No mass lesion. 3. Stable atrophy and  white matter disease. 4. 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 Recommend follow-up CTA of the head in 1 year to assess stability. This aneurysm does not correspond with the area of subarachnoid hemorrhage. Electronically Signed   By: Marin Roberts M.D.   On: 04/23/2018 19:28   Dg Chest 2 View  Result Date: 04/22/2018 CLINICAL DATA:  Intermittent LEFT arm numbness for 2 weeks, history hypertension, diabetes mellitus, anxiety, former smoker EXAM: CHEST - 2 VIEW COMPARISON:  02/12/2018 FINDINGS: Normal heart size, mediastinal contours, and pulmonary vascularity. Atherosclerotic calcification aorta. Lungs hyperinflated with minimal subsegmental atelectasis at LEFT costophrenic angle. Remaining lungs clear. No acute infiltrate, pleural effusion or pneumothorax. Bones appear demineralized with old healed fracture of the posterolateral RIGHT sixth rib. IMPRESSION: Hyperinflated lungs with minimal subsegmental atelectasis at LEFT costophrenic angle. Electronically Signed   By: Ulyses Southward M.D.   On: 04/22/2018 11:07   Ct Head Wo Contrast  Result Date: 04/22/2018 CLINICAL DATA:  Left arm numbness. EXAM: CT HEAD WITHOUT CONTRAST CT CERVICAL SPINE WITHOUT CONTRAST TECHNIQUE: Multidetector CT imaging of the head and cervical spine was performed following the standard protocol without intravenous contrast. Multiplanar CT image reconstructions of the cervical spine were also generated. COMPARISON:  None. FINDINGS: CT HEAD FINDINGS Brain: Mild diffuse cortical atrophy is noted. Mild chronic ischemic white matter disease is noted. No mass effect or midline shift is noted. Ventricular size is within normal limits. There is no evidence of mass lesion, hemorrhage or acute infarction. Vascular: No hyperdense vessel or unexpected calcification. Skull: Normal. Negative for fracture or focal lesion. Sinuses/Orbits: No acute finding. Other: None. CT CERVICAL SPINE FINDINGS Alignment:  Grade 1 anterolisthesis of C3-4 C4-5 is noted secondary to posterior facet joint hypertrophy. Skull base and vertebrae: No acute fracture. No primary bone lesion or focal pathologic process. Soft tissues and spinal canal: No prevertebral fluid or swelling. No visible canal hematoma. Disc levels: Severe degenerative disc disease is noted at C5-6 and C6-7 with anterior osteophyte formation. Mild degenerative disc disease is noted at C4-5 and C7-T1. Upper chest: Negative. Other: Degenerative changes seen involving posterior facet joints bilaterally, right greater than left. IMPRESSION: Mild diffuse cortical atrophy. Mild chronic ischemic white matter disease. No acute intracranial abnormality seen. Multilevel degenerative disc disease is noted in the cervical spine. No fracture or other acute abnormality is noted. Electronically Signed   By: Lupita Raider, M.D.   On: 04/22/2018 11:09   Ct Angio Neck W Or Wo Contrast  Result Date: 04/23/2018 CLINICAL DATA:  Subarachnoid hemorrhage, proven, follow-up. EXAM: CT ANGIOGRAPHY HEAD AND NECK TECHNIQUE: Multidetector CT imaging of the head and neck was performed using the standard protocol during bolus administration of intravenous contrast. Multiplanar CT image reconstructions and MIPs were obtained to evaluate the vascular anatomy. Carotid stenosis measurements (when applicable) are obtained utilizing NASCET criteria, using the distal internal carotid diameter as the denominator. CONTRAST:  50mL ISOVUE-370 IOPAMIDOL (ISOVUE-370) INJECTION 76% COMPARISON:  The of conservatively or FINDINGS: CT HEAD FINDINGS Brain: Subarachnoid hemorrhage within the right middle frontal sulcus is stable. No new areas of  hemorrhage are present. Atrophy and white matter disease is noted bilaterally. No mass lesion is present. No significant cortical infarct is present. Vascular: Atherosclerotic calcifications are present within the cavernous internal carotid arteries. There is no hyperdense  vessel. Skull: Calvarium is intact. No focal lytic or blastic lesions are present. Asymmetric degenerative changes are present at the right TMJ. Sinuses: The paranasal sinuses and mastoid air cells are clear. Orbits: Left lens replacement is present. Globes and orbits are otherwise within normal limits. Review of the MIP images confirms the above findings CTA NECK FINDINGS Aortic arch: A 3 vessel arch configuration is present. No significant stenosis or aneurysm is present. Right carotid system: The right common carotid artery is within normal limits. Dense atherosclerotic calcifications are present at the bifurcation without a significant stenosis relative to the more distal vessel. Left carotid system: The left common carotid artery is within normal limits. Atherosclerotic calcifications are present at the bifurcation without a significant luminal stenosis relative to the more distal vessel. Vertebral arteries: The vertebral arteries are codominant. Both vertebral arteries originate from the subclavian arteries without a significant stenosis. There is some tortuosity of the V1 segments bilaterally. No significant stenosis or vessel injury is present to either vertebral artery in the neck. Skeleton: Degenerative anterolisthesis is present at C2-3, C3-4, and C4-5. There is chronic loss of disc height with endplate sclerosis at C5-6 and C6-7. Marked lucency surrounds the roots of the residual right maxillary molar. Dental caries are present without periapical disease. Other neck: The soft tissues the neck are otherwise unremarkable. No significant adenopathy is present. No mucosal lesions are evident. Salivary glands are within normal limits bilaterally. Thyroid is normal. Upper chest: Centrilobular emphysematous changes are present bilaterally. No focal nodule or mass lesion is present. Review of the MIP images confirms the above findings CTA HEAD FINDINGS Anterior circulation: Atherosclerotic calcifications are  present within the cavernous internal carotid arteries bilaterally. There is no hyperdense vessel. 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 No definite posterior communicating artery is present. The A1 and M1 segments are normal. The anterior communicating artery is patent. MCA bifurcations are within normal limits bilaterally. The ACA and MCA branch vessels are normal. No focal vascular lesion is evident at the location of the subarachnoid hemorrhage over the right frontal convexity. Posterior circulation: Vertebral arteries are within normal limits bilaterally. PICA origins are visualized and normal. The basilar artery is normal. Both posterior cerebral arteries originate from the basilar tip. PCA branch vessels are within normal limits bilaterally. Venous sinuses: The dural sinuses are patent. Straight sinus and deep cerebral veins are intact. Cortical veins are normal. Anatomic variants: None Delayed phase: Delayed images demonstrate no pathologic enhancement. White matter disease is accentuated. Review of the MIP images confirms the above findings IMPRESSION: 1. No acute or focal vascular lesion to explain subarachnoid hemorrhage over the convexity of the right frontal lobe. 2. No mass lesion. 3. Stable atrophy and white matter disease. 4. 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 Recommend follow-up CTA of the head in 1 year to assess stability. This aneurysm does not correspond with the area of subarachnoid hemorrhage. Electronically Signed   By: Marin Roberts M.D.   On: 04/23/2018 19:28   Ct Cervical Spine Wo Contrast  Result Date: 04/22/2018 CLINICAL DATA:  Left arm numbness. EXAM: CT HEAD WITHOUT CONTRAST CT CERVICAL SPINE WITHOUT CONTRAST TECHNIQUE: Multidetector CT imaging of the head and cervical spine was performed  following the standard protocol without intravenous contrast. Multiplanar CT image reconstructions of  the cervical spine were also generated. COMPARISON:  None. FINDINGS: CT HEAD FINDINGS Brain: Mild diffuse cortical atrophy is noted. Mild chronic ischemic white matter disease is noted. No mass effect or midline shift is noted. Ventricular size is within normal limits. There is no evidence of mass lesion, hemorrhage or acute infarction. Vascular: No hyperdense vessel or unexpected calcification. Skull: Normal. Negative for fracture or focal lesion. Sinuses/Orbits: No acute finding. Other: None. CT CERVICAL SPINE FINDINGS Alignment: Grade 1 anterolisthesis of C3-4 C4-5 is noted secondary to posterior facet joint hypertrophy. Skull base and vertebrae: No acute fracture. No primary bone lesion or focal pathologic process. Soft tissues and spinal canal: No prevertebral fluid or swelling. No visible canal hematoma. Disc levels: Severe degenerative disc disease is noted at C5-6 and C6-7 with anterior osteophyte formation. Mild degenerative disc disease is noted at C4-5 and C7-T1. Upper chest: Negative. Other: Degenerative changes seen involving posterior facet joints bilaterally, right greater than left. IMPRESSION: Mild diffuse cortical atrophy. Mild chronic ischemic white matter disease. No acute intracranial abnormality seen. Multilevel degenerative disc disease is noted in the cervical spine. No fracture or other acute abnormality is noted. Electronically Signed   By: Lupita RaiderJames  Green Jr, M.D.   On: 04/22/2018 11:09   Mr Brain Wo Contrast (neuro Protocol)  Result Date: 04/22/2018 CLINICAL DATA:  Numbness or tingling, paresthesia. Intermittent left arm numbness for 2 weeks EXAM: MRI HEAD WITHOUT CONTRAST TECHNIQUE: Multiplanar, multiecho pulse sequences of the brain and surrounding structures were obtained without intravenous contrast. COMPARISON:  CT from earlier today FINDINGS: Brain: Small focus of subarachnoid FLAIR hyperintensity and gradient hypointensity along the precentral gyrus on the right, this area is  high-density by CT. No other site of hemorrhage seen, either acute or chronic. There is mild for age chronic small vessel ischemia in the cerebral white matter. Mild cerebral volume loss. Clustered appearance of gyri at the left vertex, nonspecific and likely noncontributory. No underlying mass is seen and there is no extrinsic mass effect by collection. No hydrocephalus. Vascular: Major flow voids are preserved Skull and upper cervical spine: No evidence of marrow lesion. Diffuse cervical facet spurring with mild C3-4 and C4-5 anterolisthesis. Sinuses/Orbits: Left cataract resection. Other: These results were called by telephone at the time of interpretation on 04/22/2018 at 2:10 pm to Dr. Samuel JesterKATHLEEN MCMANUS , who verbally acknowledged these results. IMPRESSION: 1. Small volume subarachnoid hemorrhage along the high right frontal convexity. Trauma, coagulopathy, or vasculopathy/amyloid are primary considerations in this location. There is normal superior sagittal sinus flow voids. No acute or remote hemorrhage seen elsewhere. 2. Mild atrophy and chronic small vessel ischemia for age. Electronically Signed   By: Marnee SpringJonathon  Watts M.D.   On: 04/22/2018 14:14    Assessment & Plan:   There are no diagnoses linked to this encounter.   No orders of the defined types were placed in this encounter.    Follow-up: No follow-ups on file.  Sonda PrimesAlex Plotnikov, MD

## 2018-05-02 NOTE — Assessment & Plan Note (Signed)
Dr Pearlean BrownieSethi - f/u on 9/12 BP control Keppra po for seizure prophylaxis. No driving

## 2018-05-02 NOTE — Assessment & Plan Note (Signed)
Paxil 

## 2018-05-02 NOTE — Assessment & Plan Note (Signed)
No relapse BMET

## 2018-05-02 NOTE — Assessment & Plan Note (Signed)
BP Readings from Last 3 Encounters:  05/02/18 134/82  04/24/18 110/70  04/15/18 132/78

## 2018-05-05 ENCOUNTER — Other Ambulatory Visit (INDEPENDENT_AMBULATORY_CARE_PROVIDER_SITE_OTHER): Payer: Medicare Other

## 2018-05-05 ENCOUNTER — Other Ambulatory Visit: Payer: Self-pay | Admitting: Internal Medicine

## 2018-05-05 DIAGNOSIS — I609 Nontraumatic subarachnoid hemorrhage, unspecified: Secondary | ICD-10-CM

## 2018-05-05 DIAGNOSIS — I10 Essential (primary) hypertension: Secondary | ICD-10-CM

## 2018-05-05 LAB — BASIC METABOLIC PANEL
BUN: 16 mg/dL (ref 6–23)
CO2: 27 mEq/L (ref 19–32)
Calcium: 9.7 mg/dL (ref 8.4–10.5)
Chloride: 100 mEq/L (ref 96–112)
Creatinine, Ser: 1.12 mg/dL (ref 0.40–1.20)
GFR: 49.11 mL/min — ABNORMAL LOW (ref >60.00)
Glucose, Bld: 99 mg/dL (ref 70–99)
Potassium: 4 mEq/L (ref 3.5–5.1)
Sodium: 136 mEq/L (ref 135–145)

## 2018-05-05 LAB — CBC WITH DIFFERENTIAL/PLATELET
Basophils Absolute: 0 10*3/uL (ref 0.0–0.1)
Basophils Relative: 0.3 % (ref 0.0–3.0)
Eosinophils Absolute: 0.2 10*3/uL (ref 0.0–0.7)
Eosinophils Relative: 2.6 % (ref 0.0–5.0)
HCT: 36.6 % (ref 36.0–46.0)
Hemoglobin: 12.3 g/dL (ref 12.0–15.0)
Lymphocytes Relative: 39.1 % (ref 12.0–46.0)
Lymphs Abs: 2.7 10*3/uL (ref 0.7–4.0)
MCHC: 33.5 g/dL (ref 30.0–36.0)
MCV: 83.3 fl (ref 78.0–100.0)
Monocytes Absolute: 0.7 10*3/uL (ref 0.1–1.0)
Monocytes Relative: 10.8 % (ref 3.0–12.0)
Neutro Abs: 3.3 10*3/uL (ref 1.4–7.7)
Neutrophils Relative %: 47.2 % (ref 43.0–77.0)
Platelets: 314 10*3/uL (ref 150.0–400.0)
RBC: 4.39 Mil/uL (ref 3.87–5.11)
RDW: 15.2 % (ref 11.5–15.5)
WBC: 6.9 10*3/uL (ref 4.0–10.5)

## 2018-05-10 ENCOUNTER — Other Ambulatory Visit: Payer: Self-pay | Admitting: Internal Medicine

## 2018-05-13 ENCOUNTER — Encounter: Payer: Self-pay | Admitting: Physical Therapy

## 2018-05-13 ENCOUNTER — Ambulatory Visit: Payer: Medicare Other | Attending: Internal Medicine | Admitting: Physical Therapy

## 2018-05-13 ENCOUNTER — Other Ambulatory Visit: Payer: Self-pay

## 2018-05-13 DIAGNOSIS — M6281 Muscle weakness (generalized): Secondary | ICD-10-CM

## 2018-05-13 DIAGNOSIS — R2681 Unsteadiness on feet: Secondary | ICD-10-CM | POA: Diagnosis not present

## 2018-05-13 NOTE — Patient Instructions (Signed)
Access Code: [email or web address]  URL: [email or web address]  Date: 05/13/2018  Prepared by: Marylyn IshiharaSara Kiser   Exercises  Sit to Stand without Arm Support - 10 reps - 3 sets - 2x daily - 7x weekly  Tandem Stance with Support - 3 sets - 30 hold - 2x daily - 7x weekly  Single Leg Heel Raise with Counter Support - 15-20 reps - 2x daily - 7x weekly    [facility] [address], [address] [place], [place] [address] Phone # [phone] Fax [phone]

## 2018-05-13 NOTE — Therapy (Addendum)
[facility] [address], [place], [place], [address] Phone: [phone]   Fax:  [phone]  Physical Therapy Evaluation/Discharge  Patient Details  Name: Vickie Wilson MRN: [id number] Date of Birth: 10-20-1930 Referring Provider: Florencia Reasons, MD   Encounter Date: 05/13/2018  PT End of Session - 05/13/18 1059    Visit Number  1    Date for PT Re-Evaluation  06/17/18    Authorization Type  [employer] medicare     Authorization Time Period  05/13/18 to 06/24/18    PT Start Time  1016    PT Stop Time  1058    PT Time Calculation (min)  42 min    Activity Tolerance  No increased pain;Patient tolerated treatment well    Behavior During Therapy  [facility] for tasks assessed/performed       Past Medical History:  Diagnosis Date  . Allergy   . Anxiety   . Atrial mass    right  . Constipation   . Depression   . Diabetes mellitus   . Hyperlipidemia   . Hypertension   . Osteoporosis     Past Surgical History:  Procedure Laterality Date  . DILATION AND CURETTAGE OF UTERUS  1987    There were no vitals filed for this visit.   Subjective Assessment - 05/13/18 1021    Subjective  A couple of months ago, pt reports that she was sitting in her house and her arm started to get numb. She had a brain scan which showed a little blood on her brain. She no longer has arm numbness but does feel a little unsteady. She thinks in the last 2-3 months her balance has been a problem. She did fall about 5-6 months ago but none since then.     Patient Stated Goals  improve balance     Currently in Pain?  No/denies         [facility] PT Assessment - 05/13/18 0001      Assessment   Medical Diagnosis  SAH    Referring Provider  Florencia Reasons, MD    Prior Therapy  none       Precautions   Precautions  None      Restrictions   Weight Bearing Restrictions  No      Balance Screen   Has the patient fallen in the past 6 months  Yes    How many times?   1    Has the patient had a decrease in activity level because of a fear of falling?   No    Is the patient reluctant to leave their home because of a fear of falling?   No      Home Environment   Additional Comments  lives alone; has 1 flight of stairs at home       Cognition   Overall Cognitive Status  Within Functional Limits for tasks assessed      ROM / Strength   AROM / PROM / Strength  Strength      Strength   Strength Assessment Site  Hip;Knee;Ankle    Right/Left Hip  Right;Left    Right Hip Flexion  4/5    Right Hip Extension  4/5    Right Hip ABduction  5/5    Left Hip Flexion  4/5    Left Hip Extension  4/5    Left Hip ABduction  5/5    Right/Left Knee  Right;Left    Right Knee  Extension  5/5    Left Knee Extension  5/5    Right/Left Ankle  Right;Left    Right Ankle Dorsiflexion  5/5    Left Ankle Dorsiflexion  5/5      Transfers   Five time sit to stand comments   7 sec, crashing into chair       Standardized Balance Assessment   Standardized Balance Assessment  Berg Balance Test;Timed Up and Go Test;Dynamic Gait Index      Berg Balance Test   Sit to Stand  Able to stand without using hands and stabilize independently    Standing Unsupported  Able to stand safely 2 minutes    Sitting with Back Unsupported but Feet Supported on Floor or Stool  Able to sit safely and securely 2 minutes    Stand to Sit  Sits safely with minimal use of hands    Transfers  Able to transfer safely, minor use of hands    Standing Unsupported with Eyes Closed  Able to stand 10 seconds safely    Standing Ubsupported with Feet Together  Able to place feet together independently and stand 1 minute safely    From Standing, Reach Forward with Outstretched Arm  Can reach confidently >25 cm (10")    From Standing Position, Pick up Object from Floor  Able to pick up shoe safely and easily    From Standing Position, Turn to Look Behind Over each Shoulder  Looks behind from both sides and weight  shifts well    Turn 360 Degrees  Able to turn 360 degrees safely in 4 seconds or less    Standing Unsupported, Alternately Place Feet on Step/Stool  Able to stand independently and safely and complete 8 steps in 20 seconds    Standing Unsupported, One Foot in Front  Able to plae foot ahead of the other independently and hold 30 seconds    Standing on One Leg  Tries to lift leg/unable to hold 3 seconds but remains standing independently   Lt and Rt 2 sec    Total Score  52      Dynamic Gait Index   Level Surface  Normal    Change in Gait Speed  Normal    Gait with Horizontal Head Turns  Mild Impairment    Gait with Vertical Head Turns  Moderate Impairment    Gait and Pivot Turn  Mild Impairment    Step Over Obstacle  Normal    Step Around Obstacles  Normal    Steps  Mild Impairment    Total Score  19                Objective measurements completed on examination: See above findings.      [facility] PT Treatment/Exercise - 05/13/18 0001      Exercises   Exercises  Other Exercises    Other Exercises   single leg heel raise x15 reps each              PT Education - 05/13/18 1136    Education Details  discussed HEP and provided handout; results of balance testing    Person(s) Educated  Patient    Methods  Explanation;Verbal cues;Handout;Demonstration    Comprehension  Verbalized understanding;Returned demonstration       PT Short Term Goals - 05/13/18 1109      PT SHORT TERM GOAL #1   Title  Pt will demo consistency and independence with her initial HEP to improve balance  and safety with activty.     Time  3    Period  Weeks    Status  New    Target Date  06/03/18        PT Long Term Goals - 05/13/18 1120      PT LONG TERM GOAL #1   Title  Pt will have improved hip extension strength to 5/5 MMT which will improve her safety and efficiency with stair negotiation at her home.    Time  6    Period  Weeks    Status  New    Target Date  06/24/18       PT LONG TERM GOAL #2   Title  Pt will score greater than 21 points on the DGI to reflect improvements in her dynamic balance and decrease risk of falling in the community.     Time  6    Period  Weeks    Status  New      PT LONG TERM GOAL #3   Title  Pt will demo improved single leg stability evident by her ability to maintain SLS up to 10 sec on the Lt and Rt without LOB, 2/3 trials.     Time  6    Period  Weeks    Status  New      PT LONG TERM GOAL #4   Title  Pt will report atleast 75% improvement in her steadiness with activity at home and in the community.    Time  6    Period  Weeks    Status  New      PT LONG TERM GOAL #5   Title  Pt will be able to verbalize atleast 3 ways to decrease risk of falling around her home.    Time  6    Period  Weeks    Status  New             Plan - 05/13/18 1102    Clinical Impression Statement  Pt is a pleasant 84 y.o F referred to OPPT with complaints of unsteadiness with gait. Pt has some weakness in the hip extensors, but her primary issue is with dynamic balance activity. She scored 52/56 on the Berg balance test, however her DGI score was 19 which places her in the category for increased risk of falling in the community. She also has difficulty with tandem and single leg stance, only able to hold for 2 sec on each LE. Pt lives alone at home and would benefit from skilled PT to address her limitations in LE strength, improve endurance and proprioception in order to improve her safety with activity at home and in the community.     Clinical Presentation  Stable    Clinical Decision Making  Low    Rehab Potential  Good    PT Frequency  2x / week    PT Duration  4 weeks    PT Treatment/Interventions  ADLs/Self Care Home Management;Therapeutic exercise;Therapeutic activities;Functional mobility training;Balance training;Neuromuscular re-education;Stair training;Patient/family education;Manual techniques    PT Next Visit Plan  hip  extensor strength; static balance on uneven surfaces; progress proprioception    PT Home Exercise Plan  [id number]    Consulted and Agree with Plan of Care  Patient       Patient will benefit from skilled therapeutic intervention in order to improve the following deficits and impairments:  Decreased activity tolerance, Decreased strength, Decreased safety awareness, Improper body mechanics, Decreased balance, Decreased endurance  Visit Diagnosis: Unsteadiness on feet  Muscle weakness (generalized)     Problem List Patient Active Problem List   Diagnosis Date Noted  . SAH (subarachnoid hemorrhage) ([place]) 04/22/2018  . TIA (transient ischemic attack) 04/15/2018  . Hypokalemia 03/02/2018  . Other fatigue 02/13/2018  . Acute kidney injury ([place]) 02/12/2018  . Hyponatremia 02/12/2018  . Aortic atherosclerosis ([place]) 08/11/2017  . Carotid bruit 08/11/2017  . Urinary tract infection without hematuria 05/28/2017  . Vaginitis 05/28/2017  . HTN (hypertension) 04/04/2017  . Female bladder prolapse 04/01/2017  . Dysuria 04/22/2016  . Dry skin dermatitis 09/04/2015  . Cerumen impaction 09/04/2015  . Cataract 02/01/2015  . Burn of hand, left 06/22/2014  . Calf pain 07/14/2013  . Atrial mass 03/13/2013  . Tricuspid regurgitation   . Well adult exam 01/18/2013  . Nonspecific abnormal electrocardiogram (ECG) (EKG) 01/18/2013  . Heart murmur 01/18/2013  . Superficial phlebitis 10/21/2012  . Zoster 10/21/2012  . Hives 02/23/2012  . Arthralgia 09/01/2011  . Foot pain, right 04/22/2011  . CHANGE IN BOWELS 06/17/2010  . BLADDER PROLAPSE 01/28/2010  . Cough 01/28/2010  . ABDOMINAL BLOATING 07/12/2009  . TOBACCO USE, QUIT 07/12/2009  . ECZEMA 10/31/2008  . Dyslipidemia 06/28/2008  . Constipation 04/20/2008  . BLEPHARITIS 11/23/2007  . Actinic keratosis 09/05/2007  . Diabetic on diet only ([place]) 08/01/2007  . Anxiety state 06/29/2007  . Depression 06/29/2007  . Hypertensive renal disease  06/29/2007  . ALLERGIC RHINITIS 06/29/2007  . OSTEOPOROSIS 06/29/2007    11:49 AM,05/13/18 Sherol Dade PT, DPT [place] [facility] [address] [place], [place], [address] Phone: [phone]   Fax:  [phone]  Name: ABEERA FLANNERY MRN: [id number] Date of Birth: [birth year]   *addendum to resolve episode of care and d/c pt from [facility]  Visits from Start of Care: 1   Current functional level related to goals / functional outcomes: See above for more details    Remaining deficits: See above for more details    Education / Equipment: See above for more details   Plan: Patient agrees to discharge.  Patient goals were not met. Patient is being discharged due to financial reasons.  ?????     Pt unable to attend due to financial reasons.    11:04 AM,06/21/18 [place], [place] at [place]

## 2018-05-23 ENCOUNTER — Other Ambulatory Visit: Payer: Self-pay | Admitting: Internal Medicine

## 2018-05-23 NOTE — Telephone Encounter (Signed)
Copied from CRM [id number]. Topic: Quick Communication - Rx Refill/Question >> May 23, 2018  4:34 PM Floria Raveling A wrote: Medication: levETIRAcetam (KEPPRA) 500 MG tablet [[id number]]   Has the patient contacted their pharmacy? No  (Agent: If no, request that the patient contact the pharmacy for the refill.) (Agent: If yes, when and what did the pharmacy advise?)  Preferred Pharmacy (with phone number or street name): [facility]   Agent: Please be advised that RX refills may take up to 3 business days. We ask that you follow-up with your pharmacy.

## 2018-05-24 NOTE — Telephone Encounter (Signed)
Rx refill request: Keppra 500 mg    Outside provider               Ordered: 04/24/18  LOV: 05/02/18  PCP: [place]  Pharmacy: verified

## 2018-05-25 ENCOUNTER — Other Ambulatory Visit: Payer: Self-pay | Admitting: Internal Medicine

## 2018-05-25 DIAGNOSIS — I609 Nontraumatic subarachnoid hemorrhage, unspecified: Secondary | ICD-10-CM | POA: Diagnosis not present

## 2018-05-26 ENCOUNTER — Ambulatory Visit: Payer: Medicare Other | Admitting: Neurology

## 2018-05-26 ENCOUNTER — Encounter: Payer: Self-pay | Admitting: Neurology

## 2018-05-26 VITALS — BP 134/70 | Ht 60.0 in | Wt 109.0 lb

## 2018-05-26 DIAGNOSIS — G40109 Localization-related (focal) (partial) symptomatic epilepsy and epileptic syndromes with simple partial seizures, not intractable, without status epilepticus: Secondary | ICD-10-CM | POA: Diagnosis not present

## 2018-05-26 MED ORDER — LEVETIRACETAM 750 MG PO TABS: 750.0000 mg | ORAL_TABLET | Freq: Two times a day (BID) | ORAL | 3 refills | Status: DC

## 2018-05-26 NOTE — Patient Instructions (Signed)
I had a long discussion the patient with regards to her recurrent symptoms of retained left arm and face paresthesias likely representing simple partial seizures. I recommend she increase of Keppra to 750 mg twice daily as she is tolerating it quite well without any side effects and has had some breakthrough episodes last week. Check EEG study. She was advised to avoid seizure provoking stimuli like sleep deprivation, irregular eating and sleeping habits and medication noncompliance. She will continue conservative follow-up for intracranial aneurysm with one yearly CT angiogram of the brain. She will return for follow-up with my nurse practitioner  Shanda BumpsJessica in 3 months or call earlier if necessary.

## 2018-05-26 NOTE — Progress Notes (Signed)
[facility] [address] [place]. [place] [address] [phone]       OFFICE CONSULT NOTE  Vickie Wilson Date of Birth:  10-20-1930 Medical Record Number:  [id number]   Referring MD:  Albertine Grates  Reason for Referral:  seizure  HPI: Vickie Wilson is a pleasant 4 year Caucasian lady seen today for initial consultation visit following hospital admission recently for paresthesias. History is provided by the patient and review of electronic medical records. I personally reviewed imaging films.Vickie Wilson is an 84 y.o. female past medical history of hypertension, hyperlipidemia, diabetes mellitus, atrial mass who presents to the emergency room on 04/22/18 after having multiple spells of numbness over the left hand extending to the arm as well as left facial numbness.She stated her first episode was about 1 week ago lasting about 15 minutes mainly if he affecting her left hand, arm as well as the left side of her face. Resolved spontaneously.  Continued to have 3 more episodes and therefore today she decided to come to the hospital.  She denies any weakness, however states she was not able to use her hand like she normally could.  Patient denies any jerking/twitching movements of the left arm or face. Work-up in the ER included MRI brain which revealed a small area of subarachnoid hemorrhage on MRI brain in the right frontal lobe convexity only. This was difficult to visualize on the CT scan corresponding cuts..  Her CT scan was   negative h .  She denied any history of recent head trauma, however she had a fall few weeks ago. She was on ASA at home. EEG done on 04/23/18 was normal. Transthoracic echo showed normal ejection fraction. Patient was started on Keppra 500 twice daily and had no further episodes after that was started. 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  Neurosurgery was consulted and recommended conservative follow-up for the same. Patient states she was doing well after discharge until 5 days ago when she had 2 transient episodes of numbness in her left hand gradually going up to her left elbow but this time not involving her face. This occurred twice on one day and then once the next day. She has not had these episodes for the last 3 days at least. She remains on Keppra 500 twice daily which is tolerating well without any side effects. She states she has not missed any doses. ROS:   14 system review of systems is positive for numbness, constipation, weight loss, fatigue, weakness, anxiety, decreased energy and all other systems negative PMH:  Past Medical History:  Diagnosis Date  . Allergy   . Anxiety   . Atrial mass    right  . Constipation   . Depression   . Diabetes mellitus   . Hyperlipidemia   . Hypertension   . Osteoporosis     Social History:  Social History   Socioeconomic History  . Marital status: Single    Spouse name: Not on file  . Number of children: 1  . Years of education: Not on file  . Highest education level: 12th grade  Occupational History  . Occupation: retired  Engineer, production  . Financial resource strain: Not on file  . Food insecurity:    Worry: Not on file    Inability: Not on file  . Transportation needs:    Medical: Not on  file    Non-medical: Not on file  Tobacco Use  . Smoking status: Former Smoker    Packs/day: 1.00    Years: 20.00    Pack years: 20.00    Last attempt to quit: 03/13/1998    Years since quitting: 20.2  . Smokeless tobacco: Never Used  Substance and Sexual Activity  . Alcohol use: No  . Drug use: No  . Sexual activity: Not Currently  Lifestyle  . Physical activity:    Days per week: Not on file    Minutes per session: Not on file  . Stress: Not on file  Relationships  . Social connections:    Talks on phone: Not on file    Gets together: Not on file    Attends  religious service: Not on file    Active member of club or organization: Not on file    Attends meetings of clubs or organizations: Not on file    Relationship status: Not on file  . Intimate partner violence:    Fear of current or ex partner: Not on file    Emotionally abused: Not on file    Physically abused: Not on file    Forced sexual activity: Not on file  Other Topics Concern  . Not on file  Social History Narrative   Regular exercise-yes      Daily caffeine use      Right handed       Lives at home alone     Medications:   Current Outpatient Medications on File Prior to Visit  Medication Sig Dispense Refill  . amLODipine (NORVASC) 5 MG tablet TAKE 1 TABLET ONCE DAILY. 90 tablet 3  . atenolol (TENORMIN) 100 MG tablet TAKE (1/2) TABLET TWICE DAILY. (Patient taking differently: Take 50 mg by mouth 2 (two) times daily. ) 30 tablet 3  . Multiple Vitamins-Minerals (CENTRUM SILVER) CHEW Chew 1 each by mouth 3 (three) times a week.     Marland Kitchen PARoxetine (PAXIL) 10 MG tablet TAKE 1 TABLET IN THE MORNING. (Patient taking differently: Take 5 mg by mouth daily. ) 90 tablet 1  . rosuvastatin (CRESTOR) 20 MG tablet Take 0.5 tablets (10 mg total) by mouth daily. (Patient taking differently: Take 10 mg by mouth every evening. ) 45 tablet 3  . sennosides-docusate sodium (SENOKOT-S) 8.6-50 MG tablet Take 2 tablets by mouth at bedtime.     No current facility-administered medications on file prior to visit.     Allergies:   Allergies  Allergen Reactions  . Captopril     REACTION: cough    Physical Exam General: frail petite elderly Caucasian lady, seated, in no evident distress Head: head normocephalic and atraumatic.   Neck: supple with no carotid or supraclavicular bruits Cardiovascular: regular rate and rhythm, no murmurs Musculoskeletal: mild kyphosis Skin:  no rash/petichiae Vascular:  Normal pulses all extremities  Neurologic Exam Mental Status: Awake and fully alert.  Oriented to place and time. Recent and remote memory intact. Attention span, concentration and fund of knowledge appropriate. Mood and affect appropriate.  Cranial Nerves: Fundoscopic exam reveals sharp disc margins. Pupils equal, briskly reactive to light. Extraocular movements full without nystagmus. Visual fields full to confrontation. Hearing diminished bilaterally. Facial sensation intact. Face, tongue, palate moves normally and symmetrically.  Motor: Normal bulk and tone. Normal strength in all tested extremity muscles. Sensory.: intact to touch , pinprick , position and vibratory sensation.  Coordination: Rapid alternating movements normal in all extremities. Finger-to-nose and heel-to-shin performed accurately  bilaterally. Gait and Station: Arises from chair without difficulty. Stance is normal. Gait demonstrates normal stride length and balance . Able to heel, toe and tandem walk with  difficulty.  Reflexes: 1+ and symmetric. Toes downgoing.   NIHSS  0 Modified Rankin 1   ASSESSMENT: 84 year old lady with recurrent fleeting left arm and face paresthesias likely simple partial seizures likely symptomatic from small localized right frontal convexity subarachnoid hemorrhage which is non-aneurysmal and non-traumatic in etiology.. Incidental unruptured small 2 mm left posterior communicating artery intracranial aneurysm    PLAN: I had a long discussion the patient with regards to her recurrent symptoms of retained left arm and face paresthesias likely representing simple partial seizures. I recommend she increase of Keppra to 750 mg twice daily as she is tolerating it quite well without any side effects and has had some breakthrough episodes last week. Check EEG study. She was advised to avoid seizure provoking stimuli like sleep deprivation, irregular eating and sleeping habits and medication noncompliance. She will continue conservative follow-up for intracranial aneurysm with one yearly CT  angiogram of the brain.greater than 50% time during this 45 minute consultation visit was spent on counseling and coordination of care about her paresthesias and simple partial seizures and answering questions She will return for follow-up with my nurse practitioner  Shanda BumpsJessica in 3 months or call earlier if necessary.  Delia HeadyPramod Sethi, MD Medical Director [facility] Pager: [phone] 05/26/2018 2:30 PM   Note: This document was prepared with digital dictation and possible smart phrase technology. Any transcriptional errors that result from this process are unintentional.

## 2018-05-26 NOTE — Telephone Encounter (Signed)
Prescribed my ED, please advise

## 2018-05-27 ENCOUNTER — Encounter

## 2018-05-27 MED ORDER — LEVETIRACETAM 500 MG PO TABS: 500.0000 mg | ORAL_TABLET | Freq: Two times a day (BID) | ORAL | 5 refills | Status: DC

## 2018-05-30 ENCOUNTER — Ambulatory Visit: Payer: Medicare Other | Admitting: Physical Therapy

## 2018-05-30 ENCOUNTER — Other Ambulatory Visit: Payer: Self-pay | Admitting: Physician Assistant

## 2018-05-30 DIAGNOSIS — I609 Nontraumatic subarachnoid hemorrhage, unspecified: Secondary | ICD-10-CM

## 2018-05-30 NOTE — Telephone Encounter (Signed)
Pt states it took her a week to get this medication and that is unacceptable and this not the way an office should be ran.

## 2018-06-01 ENCOUNTER — Encounter: Payer: Medicare Other | Admitting: Physical Therapy

## 2018-06-02 ENCOUNTER — Ambulatory Visit: Payer: Medicare Other

## 2018-06-02 DIAGNOSIS — G40109 Localization-related (focal) (partial) symptomatic epilepsy and epileptic syndromes with simple partial seizures, not intractable, without status epilepticus: Secondary | ICD-10-CM | POA: Diagnosis not present

## 2018-06-06 ENCOUNTER — Encounter: Payer: Medicare Other | Admitting: Physical Therapy

## 2018-06-08 ENCOUNTER — Telehealth: Payer: Self-pay

## 2018-06-08 ENCOUNTER — Encounter: Payer: Medicare Other | Admitting: Physical Therapy

## 2018-06-08 NOTE — Telephone Encounter (Signed)
RN call patient that the EEG was normal. PT verbalized understanding.

## 2018-06-08 NOTE — Telephone Encounter (Signed)
-----   Message from Micki Riley, MD sent at 06/08/2018  8:13 AM EDT ----- Vickie Wilson inform the patient that EEG study was normal

## 2018-06-09 ENCOUNTER — Ambulatory Visit: Payer: Medicare Other

## 2018-06-13 ENCOUNTER — Encounter: Payer: Medicare Other | Admitting: Physical Therapy

## 2018-06-14 ENCOUNTER — Ambulatory Visit: Payer: Medicare Other

## 2018-06-15 ENCOUNTER — Ambulatory Visit (INDEPENDENT_AMBULATORY_CARE_PROVIDER_SITE_OTHER): Payer: Medicare Other

## 2018-06-15 ENCOUNTER — Encounter: Payer: Medicare Other | Admitting: Physical Therapy

## 2018-06-15 DIAGNOSIS — Z23 Encounter for immunization: Secondary | ICD-10-CM | POA: Diagnosis not present

## 2018-06-16 ENCOUNTER — Encounter: Payer: Medicare Other | Admitting: Physical Therapy

## 2018-06-20 ENCOUNTER — Encounter: Payer: Medicare Other | Admitting: Physical Therapy

## 2018-06-21 ENCOUNTER — Encounter: Payer: Medicare Other | Admitting: Physical Therapy

## 2018-06-21 ENCOUNTER — Other Ambulatory Visit: Payer: Self-pay

## 2018-06-21 NOTE — Patient Outreach (Signed)
[facility]) Care Management  06/21/2018  Vickie Wilson [birth year] [id number]   Medication Adherence call to Vickie Wilson left a message for patient to call back patient is due on Losartan 100 mg. [name] is showing past due under [facility] Ins.   [name] CPhT Pharmacy Technician [facility] Care Management Direct Dial [phone]  Fax [phone] [email or web address]

## 2018-06-23 ENCOUNTER — Encounter: Payer: Medicare Other | Admitting: Physical Therapy

## 2018-06-23 ENCOUNTER — Other Ambulatory Visit: Payer: Self-pay

## 2018-06-23 NOTE — Patient Outreach (Signed)
[facility]) Care Management  06/23/2018  Vickie Wilson 03-16-1931 [id number]  Medication Adherence call back from Vickie Wilson patient call back she is no longer taking Losartan 100 mg doctor took her off because of side effects.   [name] CPhT Pharmacy Technician [facility] Management Direct Dial [phone]  Fax [phone] [email or web address]

## 2018-06-30 ENCOUNTER — Other Ambulatory Visit: Payer: Self-pay | Admitting: Internal Medicine

## 2018-07-05 ENCOUNTER — Ambulatory Visit (INDEPENDENT_AMBULATORY_CARE_PROVIDER_SITE_OTHER): Payer: Medicare Other | Admitting: Internal Medicine

## 2018-07-05 ENCOUNTER — Encounter: Payer: Self-pay | Admitting: Internal Medicine

## 2018-07-05 DIAGNOSIS — I10 Essential (primary) hypertension: Secondary | ICD-10-CM

## 2018-07-05 DIAGNOSIS — E871 Hypo-osmolality and hyponatremia: Secondary | ICD-10-CM

## 2018-07-05 DIAGNOSIS — I609 Nontraumatic subarachnoid hemorrhage, unspecified: Secondary | ICD-10-CM | POA: Diagnosis not present

## 2018-07-05 DIAGNOSIS — I129 Hypertensive chronic kidney disease with stage 1 through stage 4 chronic kidney disease, or unspecified chronic kidney disease: Secondary | ICD-10-CM

## 2018-07-05 DIAGNOSIS — R569 Unspecified convulsions: Secondary | ICD-10-CM | POA: Diagnosis not present

## 2018-07-05 DIAGNOSIS — F411 Generalized anxiety disorder: Secondary | ICD-10-CM

## 2018-07-05 MED ORDER — ROSUVASTATIN CALCIUM 20 MG PO TABS: 10.0000 mg | ORAL_TABLET | Freq: Every evening | ORAL | 3 refills | Status: DC

## 2018-07-05 MED ORDER — ATENOLOL 100 MG PO TABS: 50.0000 mg | ORAL_TABLET | Freq: Two times a day (BID) | ORAL | 3 refills | Status: DC

## 2018-07-05 MED ORDER — AMLODIPINE BESYLATE 5 MG PO TABS: 5.0000 mg | ORAL_TABLET | Freq: Every day | ORAL | 3 refills | Status: DC

## 2018-07-05 NOTE — Assessment & Plan Note (Signed)
10/19 simple partial seizure PO Keppra

## 2018-07-05 NOTE — Assessment & Plan Note (Signed)
Per Dr [name]:  "ASSESSMENT: 84 year old lady with recurrent fleeting left arm and face paresthesias likely simple partial seizures likely symptomatic from small localized right frontal convexity subarachnoid hemorrhage which is non-aneurysmal and non-traumatic in etiology.. Incidental unruptured small 2 mm left posterior communicating artery intracranial aneurysm    PLAN: I had a long discussion the patient with regards to her recurrent symptoms of retained left arm and face paresthesias likely representing simple partial seizures. I recommend she increase of Keppra to 750 mg twice daily as she is tolerating it quite well without any side effects and has had some breakthrough episodes last week. Check EEG study. She was advised to avoid seizure provoking stimuli like sleep deprivation, irregular eating and sleeping habits and medication noncompliance. She will continue conservative follow-up for intracranial aneurysm with one yearly CT angiogram of the brain.  paresthesias and simple partial seizures and answering questions She will return for follow-up with my nurse practitioner  [name] in 3 months or call earlier if necessary."

## 2018-07-05 NOTE — Progress Notes (Signed)
Subjective:  Patient ID: Vickie Wilson, female    DOB: 1930/12/27  Age: 84 y.o. MRN: [id number]  CC: No chief complaint on file.   HPI Vickie Wilson presents for [facility], seizure, HTN C/o fatigue  Per Dr [name]:  "ASSESSMENT: 84 year old lady with recurrent fleeting left arm and face paresthesias likely simple partial seizures likely symptomatic from small localized right frontal convexity subarachnoid hemorrhage which is non-aneurysmal and non-traumatic in etiology.. Incidental unruptured small 2 mm left posterior communicating artery intracranial aneurysm    PLAN: I had a long discussion the patient with regards to her recurrent symptoms of retained left arm and face paresthesias likely representing simple partial seizures. I recommend she increase of Keppra to 750 mg twice daily as she is tolerating it quite well without any side effects and has had some breakthrough episodes last week. Check EEG study. She was advised to avoid seizure provoking stimuli like sleep deprivation, irregular eating and sleeping habits and medication noncompliance. She will continue conservative follow-up for intracranial aneurysm with one yearly CT angiogram of the brain.greater than 50% time during this 45 minute consultation visit was spent on counseling and coordination of care about her paresthesias and simple partial seizures and answering questions She will return for follow-up with my nurse practitioner  [name] in 3 months or call earlier if necessary."   Outpatient Medications Prior to Visit  Medication Sig Dispense Refill  . amLODipine (NORVASC) 5 MG tablet TAKE 1 TABLET ONCE DAILY. 90 tablet 3  . atenolol (TENORMIN) 100 MG tablet TAKE (1/2) TABLET TWICE DAILY. (Patient taking differently: Take 50 mg by mouth 2 (two) times daily. ) 30 tablet 3  . levETIRAcetam (KEPPRA) 750 MG tablet Take 1 tablet (750 mg total) by mouth 2 (two) times daily. 60 tablet 3  . Multiple Vitamins-Minerals (CENTRUM  SILVER) CHEW Chew 1 each by mouth 3 (three) times a week.     Marland Kitchen PARoxetine (PAXIL) 10 MG tablet TAKE 1 TABLET IN THE MORNING. 90 tablet 0  . rosuvastatin (CRESTOR) 20 MG tablet Take 0.5 tablets (10 mg total) by mouth daily. (Patient taking differently: Take 10 mg by mouth every evening. ) 45 tablet 3  . sennosides-docusate sodium (SENOKOT-S) 8.6-50 MG tablet Take 2 tablets by mouth at bedtime.    . levETIRAcetam (KEPPRA) 500 MG tablet Take 1 tablet (500 mg total) by mouth 2 (two) times daily. 60 tablet 5   No facility-administered medications prior to visit.     ROS: Review of Systems  Constitutional: Positive for fatigue. Negative for activity change, appetite change, chills and unexpected weight change.  HENT: Negative for congestion, mouth sores and sinus pressure.   Eyes: Negative for visual disturbance.  Respiratory: Negative for cough and chest tightness.   Gastrointestinal: Negative for abdominal pain and nausea.  Genitourinary: Negative for difficulty urinating, frequency and vaginal pain.  Musculoskeletal: Negative for back pain and gait problem.  Skin: Negative for pallor and rash.  Neurological: Negative for dizziness, tremors, weakness, numbness and headaches.  Psychiatric/Behavioral: Negative for confusion and sleep disturbance.    Objective:  BP 136/84 (BP Location: Left Arm, Patient Position: Sitting, Cuff Size: Normal)   Pulse (!) 53   Temp 98 F (36.7 C) (Oral)   Ht 5' (1.524 m)   Wt 112 lb (50.8 kg)   SpO2 97%   BMI 21.87 kg/m   BP Readings from Last 3 Encounters:  07/05/18 136/84  05/26/18 134/70  05/02/18 134/82    Wt Readings from Last  3 Encounters:  07/05/18 112 lb (50.8 kg)  05/26/18 109 lb (49.4 kg)  05/02/18 109 lb (49.4 kg)    Physical Exam  Constitutional: She appears well-developed. No distress.  HENT:  Head: Normocephalic.  Right Ear: External ear normal.  Left Ear: External ear normal.  Nose: Nose normal.  Mouth/Throat: Oropharynx is  clear and moist.  Eyes: Pupils are equal, round, and reactive to light. Conjunctivae are normal. Right eye exhibits no discharge. Left eye exhibits no discharge.  Neck: Normal range of motion. Neck supple. No JVD present. No tracheal deviation present. No thyromegaly present.  Cardiovascular: Normal rate, regular rhythm and normal heart sounds.  Pulmonary/Chest: No stridor. No respiratory distress. She has no wheezes.  Abdominal: Soft. Bowel sounds are normal. She exhibits no distension and no mass. There is no tenderness. There is no rebound and no guarding.  Musculoskeletal: She exhibits tenderness. She exhibits no edema.  Lymphadenopathy:    She has no cervical adenopathy.  Neurological: She displays normal reflexes. No cranial nerve deficit. She exhibits normal muscle tone. Coordination normal.  Skin: No rash noted. No erythema.  Psychiatric: She has a normal mood and affect. Her behavior is normal. Judgment and thought content normal.    Lab Results  Component Value Date   WBC 6.9 05/05/2018   HGB 12.3 05/05/2018   HCT 36.6 05/05/2018   PLT 314.0 05/05/2018   GLUCOSE 99 05/05/2018   CHOL 184 02/23/2017   TRIG 80.0 02/23/2017   HDL 61.60 02/23/2017   LDLDIRECT 125.6 01/16/2013   LDLCALC 106 (H) 02/23/2017   ALT 17 04/22/2018   AST 30 04/22/2018   NA 136 05/05/2018   K 4.0 05/05/2018   CL 100 05/05/2018   CREATININE 1.12 05/05/2018   BUN 16 05/05/2018   CO2 27 05/05/2018   TSH 3.48 03/02/2018   INR 0.92 04/22/2018   HGBA1C 6.3 03/02/2018   MICROALBUR 2.8 (H) 09/17/2016    Ct Angio Head W Or Wo Contrast  Result Date: 04/23/2018 CLINICAL DATA:  Subarachnoid hemorrhage, proven, follow-up. EXAM: CT ANGIOGRAPHY HEAD AND NECK TECHNIQUE: Multidetector CT imaging of the head and neck was performed using the standard protocol during bolus administration of intravenous contrast. Multiplanar CT image reconstructions and MIPs were obtained to evaluate the vascular anatomy. Carotid  stenosis measurements (when applicable) are obtained utilizing NASCET criteria, using the distal internal carotid diameter as the denominator. CONTRAST:  50mL ISOVUE-370 IOPAMIDOL (ISOVUE-370) INJECTION 76% COMPARISON:  The of conservatively or FINDINGS: CT HEAD FINDINGS Brain: Subarachnoid hemorrhage within the right middle frontal sulcus is stable. No new areas of hemorrhage are present. Atrophy and white matter disease is noted bilaterally. No mass lesion is present. No significant cortical infarct is present. Vascular: Atherosclerotic calcifications are present within the cavernous internal carotid arteries. There is no hyperdense vessel. Skull: Calvarium is intact. No focal lytic or blastic lesions are present. Asymmetric degenerative changes are present at the right TMJ. Sinuses: The paranasal sinuses and mastoid air cells are clear. Orbits: Left lens replacement is present. Globes and orbits are otherwise within normal limits. Review of the MIP images confirms the above findings CTA NECK FINDINGS Aortic arch: A 3 vessel arch configuration is present. No significant stenosis or aneurysm is present. Right carotid system: The right common carotid artery is within normal limits. Dense atherosclerotic calcifications are present at the bifurcation without a significant stenosis relative to the more distal vessel. Left carotid system: The left common carotid artery is within normal limits. Atherosclerotic calcifications are  present at the bifurcation without a significant luminal stenosis relative to the more distal vessel. Vertebral arteries: The vertebral arteries are codominant. Both vertebral arteries originate from the subclavian arteries without a significant stenosis. There is some tortuosity of the V1 segments bilaterally. No significant stenosis or vessel injury is present to either vertebral artery in the neck. Skeleton: Degenerative anterolisthesis is present at C2-3, C3-4, and C4-5. There is chronic loss  of disc height with endplate sclerosis at C5-6 and C6-7. Marked lucency surrounds the roots of the residual right maxillary molar. Dental caries are present without periapical disease. Other neck: The soft tissues the neck are otherwise unremarkable. No significant adenopathy is present. No mucosal lesions are evident. Salivary glands are within normal limits bilaterally. Thyroid is normal. Upper chest: Centrilobular emphysematous changes are present bilaterally. No focal nodule or mass lesion is present. Review of the MIP images confirms the above findings CTA HEAD FINDINGS Anterior circulation: Atherosclerotic calcifications are present within the cavernous internal carotid arteries bilaterally. There is no hyperdense vessel. 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 No definite posterior communicating artery is present. The A1 and M1 segments are normal. The anterior communicating artery is patent. MCA bifurcations are within normal limits bilaterally. The ACA and MCA branch vessels are normal. No focal vascular lesion is evident at the location of the subarachnoid hemorrhage over the right frontal convexity. Posterior circulation: Vertebral arteries are within normal limits bilaterally. PICA origins are visualized and normal. The basilar artery is normal. Both posterior cerebral arteries originate from the basilar tip. PCA branch vessels are within normal limits bilaterally. Venous sinuses: The dural sinuses are patent. Straight sinus and deep cerebral veins are intact. Cortical veins are normal. Anatomic variants: None Delayed phase: Delayed images demonstrate no pathologic enhancement. White matter disease is accentuated. Review of the MIP images confirms the above findings IMPRESSION: 1. No acute or focal vascular lesion to explain subarachnoid hemorrhage over the convexity of the right frontal lobe. 2. No mass lesion. 3. Stable atrophy and white matter disease. 4. 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 Recommend follow-up CTA of the head in 1 year to assess stability. This aneurysm does not correspond with the area of subarachnoid hemorrhage. Electronically Signed   By: Marin Roberts M.D.   On: 04/23/2018 19:28   Dg Chest 2 View  Result Date: 04/22/2018 CLINICAL DATA:  Intermittent LEFT arm numbness for 2 weeks, history hypertension, diabetes mellitus, anxiety, former smoker EXAM: CHEST - 2 VIEW COMPARISON:  02/12/2018 FINDINGS: Normal heart size, mediastinal contours, and pulmonary vascularity. Atherosclerotic calcification aorta. Lungs hyperinflated with minimal subsegmental atelectasis at LEFT costophrenic angle. Remaining lungs clear. No acute infiltrate, pleural effusion or pneumothorax. Bones appear demineralized with old healed fracture of the posterolateral RIGHT sixth rib. IMPRESSION: Hyperinflated lungs with minimal subsegmental atelectasis at LEFT costophrenic angle. Electronically Signed   By: Ulyses Southward M.D.   On: 04/22/2018 11:07   Ct Head Wo Contrast  Result Date: 04/22/2018 CLINICAL DATA:  Left arm numbness. EXAM: CT HEAD WITHOUT CONTRAST CT CERVICAL SPINE WITHOUT CONTRAST TECHNIQUE: Multidetector CT imaging of the head and cervical spine was performed following the standard protocol without intravenous contrast. Multiplanar CT image reconstructions of the cervical spine were also generated. COMPARISON:  None. FINDINGS: CT HEAD FINDINGS Brain: Mild diffuse cortical atrophy is noted. Mild chronic ischemic white matter disease is noted. No mass effect or midline shift is noted. Ventricular size is within  normal limits. There is no evidence of mass lesion, hemorrhage or acute infarction. Vascular: No hyperdense vessel or unexpected calcification. Skull: Normal. Negative for fracture or focal lesion. Sinuses/Orbits: No acute finding. Other: None. CT CERVICAL SPINE FINDINGS Alignment: Grade 1 anterolisthesis of  C3-4 C4-5 is noted secondary to posterior facet joint hypertrophy. Skull base and vertebrae: No acute fracture. No primary bone lesion or focal pathologic process. Soft tissues and spinal canal: No prevertebral fluid or swelling. No visible canal hematoma. Disc levels: Severe degenerative disc disease is noted at C5-6 and C6-7 with anterior osteophyte formation. Mild degenerative disc disease is noted at C4-5 and C7-T1. Upper chest: Negative. Other: Degenerative changes seen involving posterior facet joints bilaterally, right greater than left. IMPRESSION: Mild diffuse cortical atrophy. Mild chronic ischemic white matter disease. No acute intracranial abnormality seen. Multilevel degenerative disc disease is noted in the cervical spine. No fracture or other acute abnormality is noted. Electronically Signed   By: Lupita Raider, M.D.   On: 04/22/2018 11:09   Ct Angio Neck W Or Wo Contrast  Result Date: 04/23/2018 CLINICAL DATA:  Subarachnoid hemorrhage, proven, follow-up. EXAM: CT ANGIOGRAPHY HEAD AND NECK TECHNIQUE: Multidetector CT imaging of the head and neck was performed using the standard protocol during bolus administration of intravenous contrast. Multiplanar CT image reconstructions and MIPs were obtained to evaluate the vascular anatomy. Carotid stenosis measurements (when applicable) are obtained utilizing NASCET criteria, using the distal internal carotid diameter as the denominator. CONTRAST:  50mL ISOVUE-370 IOPAMIDOL (ISOVUE-370) INJECTION 76% COMPARISON:  The of conservatively or FINDINGS: CT HEAD FINDINGS Brain: Subarachnoid hemorrhage within the right middle frontal sulcus is stable. No new areas of hemorrhage are present. Atrophy and white matter disease is noted bilaterally. No mass lesion is present. No significant cortical infarct is present. Vascular: Atherosclerotic calcifications are present within the cavernous internal carotid arteries. There is no hyperdense vessel. Skull: Calvarium  is intact. No focal lytic or blastic lesions are present. Asymmetric degenerative changes are present at the right TMJ. Sinuses: The paranasal sinuses and mastoid air cells are clear. Orbits: Left lens replacement is present. Globes and orbits are otherwise within normal limits. Review of the MIP images confirms the above findings CTA NECK FINDINGS Aortic arch: A 3 vessel arch configuration is present. No significant stenosis or aneurysm is present. Right carotid system: The right common carotid artery is within normal limits. Dense atherosclerotic calcifications are present at the bifurcation without a significant stenosis relative to the more distal vessel. Left carotid system: The left common carotid artery is within normal limits. Atherosclerotic calcifications are present at the bifurcation without a significant luminal stenosis relative to the more distal vessel. Vertebral arteries: The vertebral arteries are codominant. Both vertebral arteries originate from the subclavian arteries without a significant stenosis. There is some tortuosity of the V1 segments bilaterally. No significant stenosis or vessel injury is present to either vertebral artery in the neck. Skeleton: Degenerative anterolisthesis is present at C2-3, C3-4, and C4-5. There is chronic loss of disc height with endplate sclerosis at C5-6 and C6-7. Marked lucency surrounds the roots of the residual right maxillary molar. Dental caries are present without periapical disease. Other neck: The soft tissues the neck are otherwise unremarkable. No significant adenopathy is present. No mucosal lesions are evident. Salivary glands are within normal limits bilaterally. Thyroid is normal. Upper chest: Centrilobular emphysematous changes are present bilaterally. No focal nodule or mass lesion is present. Review of the MIP images confirms the above findings CTA HEAD  FINDINGS Anterior circulation: Atherosclerotic calcifications are present within the cavernous  internal carotid arteries bilaterally. There is no hyperdense vessel. 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 No definite posterior communicating artery is present. The A1 and M1 segments are normal. The anterior communicating artery is patent. MCA bifurcations are within normal limits bilaterally. The ACA and MCA branch vessels are normal. No focal vascular lesion is evident at the location of the subarachnoid hemorrhage over the right frontal convexity. Posterior circulation: Vertebral arteries are within normal limits bilaterally. PICA origins are visualized and normal. The basilar artery is normal. Both posterior cerebral arteries originate from the basilar tip. PCA branch vessels are within normal limits bilaterally. Venous sinuses: The dural sinuses are patent. Straight sinus and deep cerebral veins are intact. Cortical veins are normal. Anatomic variants: None Delayed phase: Delayed images demonstrate no pathologic enhancement. White matter disease is accentuated. Review of the MIP images confirms the above findings IMPRESSION: 1. No acute or focal vascular lesion to explain subarachnoid hemorrhage over the convexity of the right frontal lobe. 2. No mass lesion. 3. Stable atrophy and white matter disease. 4. 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 Recommend follow-up CTA of the head in 1 year to assess stability. This aneurysm does not correspond with the area of subarachnoid hemorrhage. Electronically Signed   By: Marin Roberts M.D.   On: 04/23/2018 19:28   Ct Cervical Spine Wo Contrast  Result Date: 04/22/2018 CLINICAL DATA:  Left arm numbness. EXAM: CT HEAD WITHOUT CONTRAST CT CERVICAL SPINE WITHOUT CONTRAST TECHNIQUE: Multidetector CT imaging of the head and cervical spine was performed following the standard protocol without intravenous contrast. Multiplanar CT image reconstructions of the cervical spine were also  generated. COMPARISON:  None. FINDINGS: CT HEAD FINDINGS Brain: Mild diffuse cortical atrophy is noted. Mild chronic ischemic white matter disease is noted. No mass effect or midline shift is noted. Ventricular size is within normal limits. There is no evidence of mass lesion, hemorrhage or acute infarction. Vascular: No hyperdense vessel or unexpected calcification. Skull: Normal. Negative for fracture or focal lesion. Sinuses/Orbits: No acute finding. Other: None. CT CERVICAL SPINE FINDINGS Alignment: Grade 1 anterolisthesis of C3-4 C4-5 is noted secondary to posterior facet joint hypertrophy. Skull base and vertebrae: No acute fracture. No primary bone lesion or focal pathologic process. Soft tissues and spinal canal: No prevertebral fluid or swelling. No visible canal hematoma. Disc levels: Severe degenerative disc disease is noted at C5-6 and C6-7 with anterior osteophyte formation. Mild degenerative disc disease is noted at C4-5 and C7-T1. Upper chest: Negative. Other: Degenerative changes seen involving posterior facet joints bilaterally, right greater than left. IMPRESSION: Mild diffuse cortical atrophy. Mild chronic ischemic white matter disease. No acute intracranial abnormality seen. Multilevel degenerative disc disease is noted in the cervical spine. No fracture or other acute abnormality is noted. Electronically Signed   By: Lupita Raider, M.D.   On: 04/22/2018 11:09   Mr Brain Wo Contrast (neuro Protocol)  Result Date: 04/22/2018 CLINICAL DATA:  Numbness or tingling, paresthesia. Intermittent left arm numbness for 2 weeks EXAM: MRI HEAD WITHOUT CONTRAST TECHNIQUE: Multiplanar, multiecho pulse sequences of the brain and surrounding structures were obtained without intravenous contrast. COMPARISON:  CT from earlier today FINDINGS: Brain: Small focus of subarachnoid FLAIR hyperintensity and gradient hypointensity along the precentral gyrus on the right, this area is high-density by CT. No other site  of hemorrhage seen, either acute or chronic.  There is mild for age chronic small vessel ischemia in the cerebral white matter. Mild cerebral volume loss. Clustered appearance of gyri at the left vertex, nonspecific and likely noncontributory. No underlying mass is seen and there is no extrinsic mass effect by collection. No hydrocephalus. Vascular: Major flow voids are preserved Skull and upper cervical spine: No evidence of marrow lesion. Diffuse cervical facet spurring with mild C3-4 and C4-5 anterolisthesis. Sinuses/Orbits: Left cataract resection. Other: These results were called by telephone at the time of interpretation on 04/22/2018 at 2:10 pm to Dr. Samuel Jester , who verbally acknowledged these results. IMPRESSION: 1. Small volume subarachnoid hemorrhage along the high right frontal convexity. Trauma, coagulopathy, or vasculopathy/amyloid are primary considerations in this location. There is normal superior sagittal sinus flow voids. No acute or remote hemorrhage seen elsewhere. 2. Mild atrophy and chronic small vessel ischemia for age. Electronically Signed   By: Marnee Spring M.D.   On: 04/22/2018 14:14    Assessment & Plan:   There are no diagnoses linked to this encounter.   No orders of the defined types were placed in this encounter.    Follow-up: No follow-ups on file.  Sonda Primes, MD

## 2018-07-05 NOTE — Assessment & Plan Note (Signed)
Norvasc, Atenolol 

## 2018-07-05 NOTE — Assessment & Plan Note (Signed)
Atenolol, Amlodipine, Maxzide 

## 2018-07-05 NOTE — Assessment & Plan Note (Signed)
Paxil 

## 2018-08-25 ENCOUNTER — Ambulatory Visit: Payer: Medicare Other | Admitting: Internal Medicine

## 2018-08-30 ENCOUNTER — Encounter: Payer: Self-pay | Admitting: Adult Health

## 2018-08-30 ENCOUNTER — Ambulatory Visit: Payer: Medicare Other | Admitting: Adult Health

## 2018-08-30 VITALS — BP 141/80 | HR 55 | Wt 113.8 lb

## 2018-08-30 DIAGNOSIS — E785 Hyperlipidemia, unspecified: Secondary | ICD-10-CM

## 2018-08-30 DIAGNOSIS — I609 Nontraumatic subarachnoid hemorrhage, unspecified: Secondary | ICD-10-CM | POA: Diagnosis not present

## 2018-08-30 DIAGNOSIS — I10 Essential (primary) hypertension: Secondary | ICD-10-CM | POA: Diagnosis not present

## 2018-08-30 DIAGNOSIS — R569 Unspecified convulsions: Secondary | ICD-10-CM

## 2018-08-30 NOTE — Patient Instructions (Signed)
Continue Crestor  for secondary stroke prevention  Continue to follow up with PCP regarding cholesterol and blood pressure management   Continue keppra 750mg  twice a day for seizure prevention   We will repeat imaging around 05/2019 for monitoring of aneurysm  Exercises to help with balance   Sit to Stand without Arm Support - 10 reps - 3 sets - 2x daily - 7x weekly   Tandem Stance with Support - 3 sets - 30 hold - 2x daily - 7x weekly   Single Leg Heel Raise with Counter Support - 15-20 reps - 2x daily - 7x weekly   Continue to monitor blood pressure at home  Maintain strict control of hypertension with blood pressure goal below 130/90, diabetes with hemoglobin A1c goal below 6.5% and cholesterol with LDL cholesterol (bad cholesterol) goal below 70 mg/dL. I also advised the patient to eat a healthy diet with plenty of whole grains, cereals, fruits and vegetables, exercise regularly and maintain ideal body weight.  Followup in the future with me in 6 months or call earlier if needed       [facility] you [facility]. I hope we have been able to provide you high quality care today.  You may receive a patient satisfaction survey over the next few weeks. We would appreciate your feedback and comments so that we may continue to improve ourselves and the health of our patients.

## 2018-08-30 NOTE — Progress Notes (Signed)
[facility] [address] [place]. [place] [id number] [phone]       OFFICE CONSULT NOTE  Vickie. Vickie Wilson Date of Birth:  02/01/1931 Medical Record Number:  [id number]   Referring MD:  Albertine GratesFang Xu  Reason for Referral:  seizure  HPI initial visit 05/26/2018 PS: Vickie Wilson is a pleasant [age] year Caucasian lady seen today for initial consultation visit following hospital admission recently for paresthesias. History is provided by the patient and review of electronic medical records. I personally reviewed imaging films.Vickie Wilson is an 84 y.o. female past medical history of hypertension, hyperlipidemia, diabetes mellitus, atrial mass who presents to the emergency room on 04/22/18 after having multiple spells of numbness over the left hand extending to the arm as well as left facial numbness.She stated her first episode was about 1 week ago lasting about 15 minutes mainly if he affecting her left hand, arm as well as the left side of her face. Resolved spontaneously.  Continued to have 3 more episodes and therefore today she decided to come to the hospital.  She denies any weakness, however states she was not able to use her hand like she normally could.  Patient denies any jerking/twitching movements of the left arm or face. Work-up in the ER included MRI brain which revealed a small area of subarachnoid hemorrhage on MRI brain in the right frontal lobe convexity only. This was difficult to visualize on the CT scan corresponding cuts..  Her CT scan was   negative h .  She denied any history of recent head trauma, however she had a fall few weeks ago. She was on ASA at home. EEG done on 04/23/18 was normal. Transthoracic echo showed normal ejection fraction. Patient was started on Keppra 500 twice daily and had no further episodes after that was started. CT angiogram of the brain showed no large vessel stenosis or occlusion but showed a small 2 mm left posterior communicating artery  aneurysm which was incidental. Neurosurgery was consulted and recommended conservative follow-up for the same. Patient states she was doing well after discharge until 5 days ago when she had 2 transient episodes of numbness in her left hand gradually going up to her left elbow but this time not involving her face. This occurred twice on one day and then once the next day. She has not had these episodes for the last 3 days at least. She remains on Keppra 500 twice daily which is tolerating well without any side effects. She states she has not missed any doses.  Interval history 08/30/2018: Patient is being seen today for 6634-month follow-up visit.  She did undergo repeat EEG which was normal without evidence of seizure activity.  She did increase keppra after prior appointment.  Approximately 3 to 4 weeks ago, patient did experience episode of left hand numbness which lasted approximately 15 seconds and then resolved.  She denies any additional episode occurring.  She does endorse fatigue during the day and tiring quicker than before but she is unsure if this is due to medications versus old age.  She does experience some unsteadiness at times but was also [facility] and being [facility].  She did undergo a couple physical therapy sessions but financially was unable to continue.  She did receive handout regarding exercises to do at home to assist with balance but she does admit to not doing this.  She does state that she consistently stays busy as she does  live independently and manages all ADLs and IADLs.  She is able to continue to ambulate without assistive device and denies any recent falls.  She also states that intermittently she will have a burning sensation/sharp pinprick sensation on her left middle finger knuckle and left AC area which may only last for 1 to 2 minutes and then subside.  She believes that this pain could be due to arthritis and is not overly concerned as it happens  infrequently.  Pressure today 141/80.  She continues on Crestor for HLD management.  No further concerns at this time.  Denies new or worsening stroke/TIA symptoms or seizure activity.    ROS:   14 system review of systems is positive for numbness, fatigue and walking difficulty and all other systems negative  PMH:  Past Medical History:  Diagnosis Date  . Allergy   . Anxiety   . Atrial mass    right  . Constipation   . Depression   . Diabetes mellitus   . Hyperlipidemia   . Hypertension   . Osteoporosis     Social History:  Social History   Socioeconomic History  . Marital status: Single    Spouse name: Not on file  . Number of children: 1  . Years of education: Not on file  . Highest education level: 12th grade  Occupational History  . Occupation: retired  Engineer, production  . Financial resource strain: Not on file  . Food insecurity:    Worry: Not on file    Inability: Not on file  . Transportation needs:    Medical: Not on file    Non-medical: Not on file  Tobacco Use  . Smoking status: Former Smoker    Packs/day: 1.00    Years: 20.00    Pack years: 20.00    Last attempt to quit: 03/13/1998    Years since quitting: 20.4  . Smokeless tobacco: Never Used  Substance and Sexual Activity  . Alcohol use: No  . Drug use: No  . Sexual activity: Not Currently  Lifestyle  . Physical activity:    Days per week: Not on file    Minutes per session: Not on file  . Stress: Not on file  Relationships  . Social connections:    Talks on phone: Not on file    Gets together: Not on file    Attends religious service: Not on file    Active member of club or organization: Not on file    Attends meetings of clubs or organizations: Not on file    Relationship status: Not on file  . Intimate partner violence:    Fear of current or ex partner: Not on file    Emotionally abused: Not on file    Physically abused: Not on file    Forced sexual activity: Not on file  Other Topics  Concern  . Not on file  Social History Narrative   Regular exercise-yes      Daily caffeine use      Right handed       Lives at home alone     Medications:   Current Outpatient Medications on File Prior to Visit  Medication Sig Dispense Refill  . amLODipine (NORVASC) 5 MG tablet Take 1 tablet (5 mg total) by mouth daily. 90 tablet 3  . atenolol (TENORMIN) 100 MG tablet Take 0.5 tablets (50 mg total) by mouth 2 (two) times daily. 90 tablet 3  . levETIRAcetam (KEPPRA) 750 MG tablet Take 1  tablet (750 mg total) by mouth 2 (two) times daily. 60 tablet 3  . Multiple Vitamins-Minerals (CENTRUM SILVER) CHEW Chew 1 each by mouth 3 (three) times a week.     Marland Kitchen PARoxetine (PAXIL) 10 MG tablet TAKE 1 TABLET IN THE MORNING. 90 tablet 0  . rosuvastatin (CRESTOR) 20 MG tablet Take 0.5 tablets (10 mg total) by mouth every evening. 45 tablet 3  . sennosides-docusate sodium (SENOKOT-S) 8.6-50 MG tablet Take 2 tablets by mouth at bedtime.     No current facility-administered medications on file prior to visit.     Allergies:   Allergies  Allergen Reactions  . Captopril     REACTION: cough    Physical Exam General: frail petite elderly Caucasian lady, seated, in no evident distress Head: head normocephalic and atraumatic.   Neck: supple with no carotid or supraclavicular bruits Cardiovascular: regular rate and rhythm, no murmurs Musculoskeletal: mild kyphosis Skin:  no rash/petichiae Vascular:  Normal pulses all extremities  Neurologic Exam Mental Status: Awake and fully alert. Oriented to place and time. Recent and remote memory intact. Attention span, concentration and fund of knowledge appropriate. Mood and affect appropriate.  Cranial Nerves: Fundoscopic exam deferred. Pupils equal, briskly reactive to light. Extraocular movements full without nystagmus. Visual fields full to confrontation. Hearing diminished bilaterally. Facial sensation intact. Face, tongue, palate moves normally and  symmetrically.  Motor: Normal bulk and tone. Normal strength in all tested extremity muscles. Sensory.: intact to touch , pinprick , position and vibratory sensation.  Coordination: Rapid alternating movements normal in all extremities. Finger-to-nose and heel-to-shin performed accurately bilaterally. Gait and Station: Arises from chair without difficulty. Stance is normal. Gait demonstrates normal stride length and balance .  Unable to heel, toe and tandem walk due to difficulty Reflexes: 1+ and symmetric. Toes downgoing.      ASSESSMENT: 84 year old lady with recurrent fleeting left arm and face paresthesias likely simple partial seizures likely symptomatic from small localized right frontal convexity subarachnoid hemorrhage which is non-aneurysmal and non-traumatic in etiology.. Incidental unruptured small 2 mm left posterior communicating artery intracranial aneurysm.  Patient is being seen today for for follow-up visit and has only had 1 simple partial seizure episode since prior visit consisting of left hand numbness and continues on Keppra 750 mg twice daily.    PLAN: -Continue Crestor for secondary stroke prevention -f/u with PCP for HLD and HTN management -Continue Keppra 750 mg twice daily for seizure prevention.  Patient was advised to notify us with worsening symptoms -Unclear etiology of left finger and AC pains.  Discussion with patient regarding how these could be potentially related to her simple partial seizures or could be related to arthritis/nerve pain.  As they only happen intermittently, patient declines increasing any medication at this time but was advised to call office if these worsen. -Repeat CTA around 05/2019 for monitoring of aneurysm -Exercises that were provided after PT for balance concerns were printed out and provided to patient with recommendations of doing these daily to assist with ongoing balance concerns.  Patient declines referral to PT due to financial  reasons but was instructed to notify office if she would like to participate in PT in the future -Advised to continue to monitor blood pressure at home -Continue to stay active as tolerated maintain a healthy diet -Maintain strict control of hypertension with blood pressure goal below 130/90, diabetes with hemoglobin A1c goal below 6.5% and cholesterol with LDL cholesterol (bad cholesterol) goal below 70 mg/dL. I also advised the  patient to eat a healthy diet with plenty of whole grains, cereals, fruits and vegetables, exercise regularly and maintain ideal body weight.  Follow-up in 6 months or call earlier with questions, concerns or need of sooner follow-up appointment  Greater than 50% time during this 45 minute consultation visit was spent on counseling and coordination of care about her paresthesias and simple partial seizures and answering questions She will return for follow-up with my nurse practitioner  [name] in 3 months or call earlier if necessary.  George Hugh, AGNP-BC  [facility] [address] [place], [place] [address]  Phone [phone] Fax [phone] Note: This document was prepared with digital dictation and possible smart phrase technology. Any transcriptional errors that result from this process are unintentional.

## 2018-08-31 NOTE — Progress Notes (Signed)
I agree with the above plan 

## 2018-09-23 DIAGNOSIS — H5212 Myopia, left eye: Secondary | ICD-10-CM | POA: Diagnosis not present

## 2018-09-23 DIAGNOSIS — H2513 Age-related nuclear cataract, bilateral: Secondary | ICD-10-CM | POA: Diagnosis not present

## 2018-09-23 DIAGNOSIS — H524 Presbyopia: Secondary | ICD-10-CM | POA: Diagnosis not present

## 2018-09-23 LAB — HM DIABETES EYE EXAM

## 2018-09-26 ENCOUNTER — Other Ambulatory Visit: Payer: Self-pay | Admitting: Internal Medicine

## 2018-09-30 ENCOUNTER — Other Ambulatory Visit: Payer: Self-pay | Admitting: Internal Medicine

## 2018-10-04 NOTE — Progress Notes (Addendum)
Subjective:   Vickie Wilson is a 85 y.o. female who presents for Medicare Annual (Subsequent) preventive examination.  Review of Systems:  No ROS.  Medicare Wellness Visit. Additional risk factors are reflected in the social history. Cardiac Risk Factors include: diabetes mellitus;dyslipidemia;hypertension;advanced age (>29men, >60 women) Sleep patterns: feels rested on waking, gets up 1 times nightly to void and sleeps 7-8 hours nightly.    Home Safety/Smoke Alarms: Feels safe in home. Smoke alarms in place.  Living environment; residence and Firearm Safety: 2-story house, no firearms. Lives alone, no needs for DME, good support system Seat Belt Safety/Bike Helmet: Wears seat belt.    Objective:     Vitals: BP 128/78   Pulse 62   Ht 5\' 2"  (1.575 m)   Wt 112 lb (50.8 kg)   SpO2 96%   BMI 20.49 kg/m   Body mass index is 20.49 kg/m.  Advanced Directives 10/05/2018 05/13/2018 04/22/2018 04/22/2018 02/12/2018  Does Patient Have a Medical Advance Directive? Yes Yes Yes Yes No  Type of Estate agent of [place];Living will Healthcare Power of [place];Living will - Healthcare Power of [place];Living will -  Does patient want to make changes to medical advance directive? - No - Patient declined No - Patient declined - -  Copy of Healthcare Power of Attorney in Chart? No - copy requested No - copy requested - No - copy requested -  Would patient like information on creating a medical advance directive? - - - - No - Patient declined    Tobacco Social History   Tobacco Use  Smoking Status Former Smoker  . Packs/day: 1.00  . Years: 20.00  . Pack years: 20.00  . Last attempt to quit: 03/13/1998  . Years since quitting: 20.5  Smokeless Tobacco Never Used     Counseling given: Not Answered  Past Medical History:  Diagnosis Date  . Allergy   . Anxiety   . Atrial mass    right  . Constipation   . Depression   . Diabetes mellitus   . Hyperlipidemia   .  Hypertension   . Osteoporosis   . Seizures (HCC)    Past Surgical History:  Procedure Laterality Date  . DILATION AND CURETTAGE OF UTERUS  1987   Family History  Problem Relation Age of Onset  . Heart disease Mother   . Alcohol abuse Father   . Heart disease Father   . Hyperlipidemia Other   . Hypertension Other   . Stroke Other    Social History   Socioeconomic History  . Marital status: Single    Spouse name: Not on file  . Number of children: 1  . Years of education: Not on file  . Highest education level: 12th grade  Occupational History  . Occupation: retired  Engineer, production  . Financial resource strain: Not very hard  . Food insecurity:    Worry: Never true    Inability: Never true  . Transportation needs:    Medical: No    Non-medical: No  Tobacco Use  . Smoking status: Former Smoker    Packs/day: 1.00    Years: 20.00    Pack years: 20.00    Last attempt to quit: 03/13/1998    Years since quitting: 20.5  . Smokeless tobacco: Never Used  Substance and Sexual Activity  . Alcohol use: No  . Drug use: No  . Sexual activity: Not Currently  Lifestyle  . Physical activity:    Days per week:  0 days    Minutes per session: 0 min  . Stress: Not at all  Relationships  . Social connections:    Talks on phone: More than three times a week    Gets together: More than three times a week    Attends religious service: More than 4 times per year    Active member of club or organization: Yes    Attends meetings of clubs or organizations: More than 4 times per year    Relationship status: Not on file  Other Topics Concern  . Not on file  Social History Narrative   Regular exercise-yes      Daily caffeine use      Right handed       Lives at home alone     Outpatient Encounter Medications as of 10/05/2018  Medication Sig  . amLODipine (NORVASC) 5 MG tablet Take 1 tablet (5 mg total) by mouth daily.  Marland Kitchen. atenolol (TENORMIN) 100 MG tablet Take 0.5 tablets (50 mg  total) by mouth 2 (two) times daily.  Marland Kitchen. levETIRAcetam (KEPPRA) 750 MG tablet Take 1 tablet (750 mg total) by mouth 2 (two) times daily.  . Multiple Vitamins-Minerals (CENTRUM SILVER) CHEW Chew 1 each by mouth 3 (three) times a week.   Marland Kitchen. PARoxetine (PAXIL) 10 MG tablet TAKE 1 TABLET IN THE MORNING.  . rosuvastatin (CRESTOR) 20 MG tablet Take 0.5 tablets (10 mg total) by mouth every evening.  . sennosides-docusate sodium (SENOKOT-S) 8.6-50 MG tablet Take 2 tablets by mouth at bedtime.   No facility-administered encounter medications on file as of 10/05/2018.     Activities of Daily Living In your present state of health, do you have any difficulty performing the following activities: 10/05/2018 04/22/2018  Hearing? N N  Vision? N N  Difficulty concentrating or making decisions? N N  Walking or climbing stairs? N N  Dressing or bathing? N N  Doing errands, shopping? N N  Preparing Food and eating ? N -  Using the Toilet? N -  In the past six months, have you accidently leaked urine? N -  Do you have problems with loss of bowel control? N -  Managing your Medications? N -  Managing your Finances? N -  Housekeeping or managing your Housekeeping? N -  Some recent data might be hidden    Patient Care Team: Plotnikov, Georgina QuintAleksei V, MD as PCP - General [place], Peter M, MD as Consulting Physician (Cardiology) Maris BergerMcCuen, Christine, MD as Consulting Physician (Ophthalmology) Micki RileySethi, Pramod S, MD as Consulting Physician (Neurology)    Assessment:   This is a routine wellness examination for Vickie PihJackie. Physical assessment deferred to PCP.  Exercise Activities and Dietary recommendations Current Exercise Habits: The patient does not participate in regular exercise at present, Exercise limited by: None identified  Diet (meal preparation, eat out, water intake, caffeinated beverages, dairy products, fruits and vegetables): in general, a "healthy" diet  , well balanced   Reviewed heart healthy and diabetic  diet. Encouraged patient to increase daily water and healthy fluid intake.  Goals   None     Fall Risk Fall Risk  10/05/2018 08/11/2017 03/04/2016 10/30/2014  Falls in the past year? 0 No No No  Risk for fall due to : Impaired balance/gait;Impaired mobility - - -   Depression Screen PHQ 2/9 Scores 10/05/2018 08/11/2017 03/04/2016 10/30/2014  PHQ - 2 Score 1 0 0 0  PHQ- 9 Score 2 - - -     Cognitive Function MMSE -  Mini Mental State Exam 10/06/2018  Not completed: Refused       Ad8 score reviewed for issues:  Issues making decisions: no  Less interest in hobbies / activities: no  Repeats questions, stories (family complaining): no  Trouble using ordinary gadgets (microwave, computer, phone):no  Forgets the month or year: no  Mismanaging finances: no  Remembering appts: no  Daily problems with thinking and/or memory: no Ad8 score is= 0  Immunization History  Administered Date(s) Administered  . Influenza Split 06/23/2011, 07/19/2012  . Influenza Whole 06/21/2008, 07/12/2009, 07/30/2010  . Influenza, High Dose Seasonal PF 09/02/2016, 05/27/2017, 06/15/2018  . Influenza,inj,Quad PF,6+ Mos 05/23/2013, 06/22/2014, 06/14/2015  . Pneumococcal Conjugate-13 12/08/2013  . Pneumococcal Polysaccharide-23 08/01/2007, 08/11/2017  . Td 04/30/2010  . Tdap 07/31/2017   Screening Tests Health Maintenance  Topic Date Due  . FOOT EXAM  02/02/1941  . OPHTHALMOLOGY EXAM  02/02/1941  . DEXA SCAN  02/01/1998  . URINE MICROALBUMIN  09/17/2017  . HEMOGLOBIN A1C  09/01/2018  . TETANUS/TDAP  08/01/2027  . INFLUENZA VACCINE  Completed  . PNA vac Low Risk Adult  Completed      Plan:    Continue doing brain stimulating activities (puzzles, reading, adult coloring books, staying active) to keep memory sharp.   Continue to eat heart healthy diet (full of fruits, vegetables, whole grains, lean protein, water--limit salt, fat, and sugar intake) and increase physical activity as  tolerated.  I have personally reviewed and noted the following in the patient's chart:   . Medical and social history . Use of alcohol, tobacco or illicit drugs  . Current medications and supplements . Functional ability and status . Nutritional status . Physical activity . Advanced directives . List of other physicians . Vitals . Screenings to include cognitive, depression, and falls . Referrals and appointments  In addition, I have reviewed and discussed with patient certain preventive protocols, quality metrics, and best practice recommendations. A written personalized care plan for preventive services as well as general preventive health recommendations were provided to patient.     Wanda PlumpJill A Wine, RN  10/06/2018   Medical screening examination/treatment/procedure(s) were performed by non-physician practitioner and as supervising physician I was immediately available for consultation/collaboration. I agree with above. Jacinta ShoeAleksei Plotnikov, MD

## 2018-10-05 ENCOUNTER — Encounter: Payer: Self-pay | Admitting: Internal Medicine

## 2018-10-05 ENCOUNTER — Ambulatory Visit (INDEPENDENT_AMBULATORY_CARE_PROVIDER_SITE_OTHER): Payer: Medicare Other | Admitting: Internal Medicine

## 2018-10-05 ENCOUNTER — Ambulatory Visit (INDEPENDENT_AMBULATORY_CARE_PROVIDER_SITE_OTHER): Payer: Medicare Other | Admitting: *Deleted

## 2018-10-05 VITALS — BP 128/78 | HR 62 | Ht 62.0 in | Wt 112.0 lb

## 2018-10-05 VITALS — BP 128/78 | HR 62 | Temp 98.2°F | Ht 60.0 in | Wt 112.0 lb

## 2018-10-05 DIAGNOSIS — I10 Essential (primary) hypertension: Secondary | ICD-10-CM

## 2018-10-05 DIAGNOSIS — G459 Transient cerebral ischemic attack, unspecified: Secondary | ICD-10-CM | POA: Diagnosis not present

## 2018-10-05 DIAGNOSIS — Z0000 Encounter for general adult medical examination without abnormal findings: Secondary | ICD-10-CM | POA: Diagnosis not present

## 2018-10-05 DIAGNOSIS — I609 Nontraumatic subarachnoid hemorrhage, unspecified: Secondary | ICD-10-CM

## 2018-10-05 DIAGNOSIS — R569 Unspecified convulsions: Secondary | ICD-10-CM

## 2018-10-05 DIAGNOSIS — E785 Hyperlipidemia, unspecified: Secondary | ICD-10-CM

## 2018-10-05 DIAGNOSIS — F411 Generalized anxiety disorder: Secondary | ICD-10-CM

## 2018-10-05 NOTE — Assessment & Plan Note (Signed)
Norvasc

## 2018-10-05 NOTE — Assessment & Plan Note (Signed)
Paxil 

## 2018-10-05 NOTE — Assessment & Plan Note (Signed)
No relapse [place], Norvasc

## 2018-10-05 NOTE — Assessment & Plan Note (Signed)
On Keppra. 

## 2018-10-05 NOTE — Assessment & Plan Note (Signed)
-   Crestor 

## 2018-10-05 NOTE — Patient Instructions (Signed)
Continue doing brain stimulating activities (puzzles, reading, adult coloring books, staying active) to keep memory sharp.   Continue to eat heart healthy diet (full of fruits, vegetables, whole grains, lean protein, water--limit salt, fat, and sugar intake) and increase physical activity as tolerated.  Health Maintenance, Female Adopting a healthy lifestyle and getting preventive care can go a long way to promote health and wellness. Talk with your health care provider about what schedule of regular examinations is right for you. This is a good chance for you to check in with your provider about disease prevention and staying healthy. In between checkups, there are plenty of things you can do on your own. Experts have done a lot of research about which lifestyle changes and preventive measures are most likely to keep you healthy. Ask your health care provider for more information. Weight and diet Eat a healthy diet  Be sure to include plenty of vegetables, fruits, low-fat dairy products, and lean protein.  Do not eat a lot of foods high in solid fats, added sugars, or salt.  Get regular exercise. This is one of the most important things you can do for your health. ? Most adults should exercise for at least 150 minutes each week. The exercise should increase your heart rate and make you sweat (moderate-intensity exercise). ? Most adults should also do strengthening exercises at least twice a week. This is in addition to the moderate-intensity exercise. Maintain a healthy weight  Body mass index (BMI) is a measurement that can be used to identify possible weight problems. It estimates body fat based on height and weight. Your health care provider can help determine your BMI and help you achieve or maintain a healthy weight.  For females 20 years of age and older: ? A BMI below 18.5 is considered underweight. ? A BMI of 18.5 to 24.9 is normal. ? A BMI of 25 to 29.9 is considered  overweight. ? A BMI of 30 and above is considered obese. Watch levels of cholesterol and blood lipids  You should start having your blood tested for lipids and cholesterol at 83 years of age, then have this test every 5 years.  You may need to have your cholesterol levels checked more often if: ? Your lipid or cholesterol levels are high. ? You are older than 83 years of age. ? You are at high risk for heart disease. Cancer screening Lung Cancer  Lung cancer screening is recommended for adults 55-80 years old who are at high risk for lung cancer because of a history of smoking.  A yearly low-dose CT scan of the lungs is recommended for people who: ? Currently smoke. ? Have quit within the past 15 years. ? Have at least a 30-pack-year history of smoking. A pack year is smoking an average of one pack of cigarettes a day for 1 year.  Yearly screening should continue until it has been 15 years since you quit.  Yearly screening should stop if you develop a health problem that would prevent you from having lung cancer treatment. Breast Cancer  Practice breast self-awareness. This means understanding how your breasts normally appear and feel.  It also means doing regular breast self-exams. Let your health care provider know about any changes, no matter how small.  If you are in your 20s or 30s, you should have a clinical breast exam (CBE) by a health care provider every 1-3 years as part of a regular health exam.  If you are 40 or   older, have a CBE every year. Also consider having a breast X-ray (mammogram) every year.  If you have a family history of breast cancer, talk to your health care provider about genetic screening.  If you are at high risk for breast cancer, talk to your health care provider about having an MRI and a mammogram every year.  Breast cancer gene (BRCA) assessment is recommended for women who have family members with BRCA-related cancers. BRCA-related cancers  include: ? Breast. ? Ovarian. ? Tubal. ? Peritoneal cancers.  Results of the assessment will determine the need for genetic counseling and BRCA1 and BRCA2 testing. Cervical Cancer Your health care provider may recommend that you be screened regularly for cancer of the pelvic organs (ovaries, uterus, and vagina). This screening involves a pelvic examination, including checking for microscopic changes to the surface of your cervix (Pap test). You may be encouraged to have this screening done every 3 years, beginning at age 21.  For women ages 30-65, health care providers may recommend pelvic exams and Pap testing every 3 years, or they may recommend the Pap and pelvic exam, combined with testing for human papilloma virus (HPV), every 5 years. Some types of HPV increase your risk of cervical cancer. Testing for HPV may also be done on women of any age with unclear Pap test results.  Other health care providers may not recommend any screening for nonpregnant women who are considered low risk for pelvic cancer and who do not have symptoms. Ask your health care provider if a screening pelvic exam is right for you.  If you have had past treatment for cervical cancer or a condition that could lead to cancer, you need Pap tests and screening for cancer for at least 20 years after your treatment. If Pap tests have been discontinued, your risk factors (such as having a new sexual partner) need to be reassessed to determine if screening should resume. Some women have medical problems that increase the chance of getting cervical cancer. In these cases, your health care provider may recommend more frequent screening and Pap tests. Colorectal Cancer  This type of cancer can be detected and often prevented.  Routine colorectal cancer screening usually begins at 83 years of age and continues through 83 years of age.  Your health care provider may recommend screening at an earlier age if you have risk factors for  colon cancer.  Your health care provider may also recommend using home test kits to check for hidden blood in the stool.  A small camera at the end of a tube can be used to examine your colon directly (sigmoidoscopy or colonoscopy). This is done to check for the earliest forms of colorectal cancer.  Routine screening usually begins at age 50.  Direct examination of the colon should be repeated every 5-10 years through 83 years of age. However, you may need to be screened more often if early forms of precancerous polyps or small growths are found. Skin Cancer  Check your skin from head to toe regularly.  Tell your health care provider about any new moles or changes in moles, especially if there is a change in a mole's shape or color.  Also tell your health care provider if you have a mole that is larger than the size of a pencil eraser.  Always use sunscreen. Apply sunscreen liberally and repeatedly throughout the day.  Protect yourself by wearing long sleeves, pants, a wide-brimmed hat, and sunglasses whenever you are outside. Heart disease, diabetes,   and high blood pressure  High blood pressure causes heart disease and increases the risk of stroke. High blood pressure is more likely to develop in: ? People who have blood pressure in the high end of the normal range (130-139/85-89 mm Hg). ? People who are overweight or obese. ? People who are African American.  If you are 62-54 years of age, have your blood pressure checked every 3-5 years. If you are 74 years of age or older, have your blood pressure checked every year. You should have your blood pressure measured twice-once when you are at a hospital or clinic, and once when you are not at a hospital or clinic. Record the average of the two measurements. To check your blood pressure when you are not at a hospital or clinic, you can use: ? An automated blood pressure machine at a pharmacy. ? A home blood pressure monitor.  If you are  between 15 years and 72 years old, ask your health care provider if you should take aspirin to prevent strokes.  Have regular diabetes screenings. This involves taking a blood sample to check your fasting blood sugar level. ? If you are at a normal weight and have a low risk for diabetes, have this test once every three years after 83 years of age. ? If you are overweight and have a high risk for diabetes, consider being tested at a younger age or more often. Preventing infection Hepatitis B  If you have a higher risk for hepatitis B, you should be screened for this virus. You are considered at high risk for hepatitis B if: ? You were born in a country where hepatitis B is common. Ask your health care provider which countries are considered high risk. ? Your parents were born in a high-risk country, and you have not been immunized against hepatitis B (hepatitis B vaccine). ? You have HIV or AIDS. ? You use needles to inject street drugs. ? You live with someone who has hepatitis B. ? You have had sex with someone who has hepatitis B. ? You get hemodialysis treatment. ? You take certain medicines for conditions, including cancer, organ transplantation, and autoimmune conditions. Hepatitis C  Blood testing is recommended for: ? Everyone born from [birth year] through 1963. ? Anyone with known risk factors for hepatitis C. Sexually transmitted infections (STIs)  You should be screened for sexually transmitted infections (STIs) including gonorrhea and chlamydia if: ? You are sexually active and are younger than 83 years of age. ? You are older than 83 years of age and your health care provider tells you that you are at risk for this type of infection. ? Your sexual activity has changed since you were last screened and you are at an increased risk for chlamydia or gonorrhea. Ask your health care provider if you are at risk.  If you do not have HIV, but are at risk, it may be recommended that you take  a prescription medicine daily to prevent HIV infection. This is called pre-exposure prophylaxis (PrEP). You are considered at risk if: ? You are sexually active and do not regularly use condoms or know the HIV status of your partner(s). ? You take drugs by injection. ? You are sexually active with a partner who has HIV. Talk with your health care provider about whether you are at high risk of being infected with HIV. If you choose to begin PrEP, you should first be tested for HIV. You should then be tested every  3 months for as long as you are taking PrEP. Pregnancy  If you are premenopausal and you may become pregnant, ask your health care provider about preconception counseling.  If you may become pregnant, take 400 to 800 micrograms (mcg) of folic acid every day.  If you want to prevent pregnancy, talk to your health care provider about birth control (contraception). Osteoporosis and menopause  Osteoporosis is a disease in which the bones lose minerals and strength with aging. This can result in serious bone fractures. Your risk for osteoporosis can be identified using a bone density scan.  If you are 49 years of age or older, or if you are at risk for osteoporosis and fractures, ask your health care provider if you should be screened.  Ask your health care provider whether you should take a calcium or vitamin D supplement to lower your risk for osteoporosis.  Menopause may have certain physical symptoms and risks.  Hormone replacement therapy may reduce some of these symptoms and risks. Talk to your health care provider about whether hormone replacement therapy is right for you. Follow these instructions at home:  Schedule regular health, dental, and eye exams.  Stay current with your immunizations.  Do not use any tobacco products including cigarettes, chewing tobacco, or electronic cigarettes.  If you are pregnant, do not drink alcohol.  If you are breastfeeding, limit how  much and how often you drink alcohol.  Limit alcohol intake to no more than 1 drink per day for nonpregnant women. One drink equals 12 ounces of beer, 5 ounces of wine, or 1 ounces of hard liquor.  Do not use street drugs.  Do not share needles.  Ask your health care provider for help if you need support or information about quitting drugs.  Tell your health care provider if you often feel depressed.  Tell your health care provider if you have ever been abused or do not feel safe at home. This information is not intended to replace advice given to you by your health care provider. Make sure you discuss any questions you have with your health care provider. Document Released: 03/16/2011 Document Revised: 02/06/2016 Document Reviewed: 06/04/2015 Elsevier Interactive Patient Education  2019 [employer].

## 2018-10-05 NOTE — Assessment & Plan Note (Signed)
repeat CTA in Sept 2020 IMPRESSION: 1. No acute or focal vascular lesion to explain subarachnoid hemorrhage over the convexity of the right frontal lobe. 2. No mass lesion. 3. Stable atrophy and white matter disease. 4. 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 Recommend follow-up CTA of the head in 1 year to assess stability. This aneurysm does not correspond with the area of subarachnoid hemorrhage.   Electronically Signed   By: Marin Roberts M.D.   On: 04/23/2018 19:28

## 2018-10-05 NOTE — Progress Notes (Signed)
Subjective:  Patient ID: Vickie Wilson, female    DOB: 1931-06-17  Age: 85 y.o. MRN: [id number]  CC: No chief complaint on file.   HPI Vickie Wilson presents for CRF, HTN, dyslipidemia f/u  Outpatient Medications Prior to Visit  Medication Sig Dispense Refill  . amLODipine (NORVASC) 5 MG tablet Take 1 tablet (5 mg total) by mouth daily. 90 tablet 3  . atenolol (TENORMIN) 100 MG tablet Take 0.5 tablets (50 mg total) by mouth 2 (two) times daily. 90 tablet 3  . levETIRAcetam (KEPPRA) 750 MG tablet Take 1 tablet (750 mg total) by mouth 2 (two) times daily. 60 tablet 3  . Multiple Vitamins-Minerals (CENTRUM SILVER) CHEW Chew 1 each by mouth 3 (three) times a week.     Marland Kitchen PARoxetine (PAXIL) 10 MG tablet TAKE 1 TABLET IN THE MORNING. 90 tablet 0  . rosuvastatin (CRESTOR) 20 MG tablet Take 0.5 tablets (10 mg total) by mouth every evening. 45 tablet 3  . sennosides-docusate sodium (SENOKOT-S) 8.6-50 MG tablet Take 2 tablets by mouth at bedtime.     No facility-administered medications prior to visit.     ROS: Review of Systems  Constitutional: Negative for activity change, appetite change, chills, fatigue and unexpected weight change.  HENT: Negative for congestion, mouth sores and sinus pressure.   Eyes: Negative for visual disturbance.  Respiratory: Negative for cough and chest tightness.   Gastrointestinal: Negative for abdominal pain and nausea.  Genitourinary: Negative for difficulty urinating, frequency and vaginal pain.  Musculoskeletal: Negative for back pain and gait problem.  Skin: Negative for pallor and rash.  Neurological: Negative for dizziness, tremors, weakness, numbness and headaches.  Psychiatric/Behavioral: Negative for confusion, sleep disturbance and suicidal ideas.    Objective:  BP 128/78 (BP Location: Left Arm, Patient Position: Sitting, Cuff Size: Normal)   Pulse 62   Temp 98.2 F (36.8 C) (Oral)   Ht 5' (1.524 m)   Wt 112 lb (50.8 kg)   SpO2 96%   BMI  21.87 kg/m   BP Readings from Last 3 Encounters:  10/05/18 128/78  08/30/18 (!) 141/80  07/05/18 136/84    Wt Readings from Last 3 Encounters:  10/05/18 112 lb (50.8 kg)  08/30/18 113 lb 12.8 oz (51.6 kg)  07/05/18 112 lb (50.8 kg)    Physical Exam Constitutional:      General: She is not in acute distress.    Appearance: She is well-developed.  HENT:     Head: Normocephalic.     Right Ear: External ear normal.     Left Ear: External ear normal.     Nose: Nose normal.  Eyes:     General:        Right eye: No discharge.        Left eye: No discharge.     Conjunctiva/sclera: Conjunctivae normal.     Pupils: Pupils are equal, round, and reactive to light.  Neck:     Musculoskeletal: Normal range of motion and neck supple.     Thyroid: No thyromegaly.     Vascular: No JVD.     Trachea: No tracheal deviation.  Cardiovascular:     Rate and Rhythm: Normal rate and regular rhythm.     Heart sounds: Normal heart sounds.  Pulmonary:     Effort: No respiratory distress.     Breath sounds: No stridor. No wheezing.  Abdominal:     General: Bowel sounds are normal. There is no distension.  Palpations: Abdomen is soft. There is no mass.     Tenderness: There is no abdominal tenderness. There is no guarding or rebound.  Musculoskeletal:        General: No tenderness.  Lymphadenopathy:     Cervical: No cervical adenopathy.  Skin:    Findings: No erythema or rash.  Neurological:     Cranial Nerves: No cranial nerve deficit.     Motor: No abnormal muscle tone.     Coordination: Coordination normal.     Deep Tendon Reflexes: Reflexes normal.  Psychiatric:        Behavior: Behavior normal.        Thought Content: Thought content normal.        Judgment: Judgment normal.     Lab Results  Component Value Date   WBC 6.9 05/05/2018   HGB 12.3 05/05/2018   HCT 36.6 05/05/2018   PLT 314.0 05/05/2018   GLUCOSE 99 05/05/2018   CHOL 184 02/23/2017   TRIG 80.0 02/23/2017    HDL 61.60 02/23/2017   LDLDIRECT 125.6 01/16/2013   LDLCALC 106 (H) 02/23/2017   ALT 17 04/22/2018   AST 30 04/22/2018   NA 136 05/05/2018   K 4.0 05/05/2018   CL 100 05/05/2018   CREATININE 1.12 05/05/2018   BUN 16 05/05/2018   CO2 27 05/05/2018   TSH 3.48 03/02/2018   INR 0.92 04/22/2018   HGBA1C 6.3 03/02/2018   MICROALBUR 2.8 (H) 09/17/2016    Ct Angio Head W Or Wo Contrast  Result Date: 04/23/2018 CLINICAL DATA:  Subarachnoid hemorrhage, proven, follow-up. EXAM: CT ANGIOGRAPHY HEAD AND NECK TECHNIQUE: Multidetector CT imaging of the head and neck was performed using the standard protocol during bolus administration of intravenous contrast. Multiplanar CT image reconstructions and MIPs were obtained to evaluate the vascular anatomy. Carotid stenosis measurements (when applicable) are obtained utilizing NASCET criteria, using the distal internal carotid diameter as the denominator. CONTRAST:  50mL ISOVUE-370 IOPAMIDOL (ISOVUE-370) INJECTION 76% COMPARISON:  The of conservatively or FINDINGS: CT HEAD FINDINGS Brain: Subarachnoid hemorrhage within the right middle frontal sulcus is stable. No new areas of hemorrhage are present. Atrophy and white matter disease is noted bilaterally. No mass lesion is present. No significant cortical infarct is present. Vascular: Atherosclerotic calcifications are present within the cavernous internal carotid arteries. There is no hyperdense vessel. Skull: Calvarium is intact. No focal lytic or blastic lesions are present. Asymmetric degenerative changes are present at the right TMJ. Sinuses: The paranasal sinuses and mastoid air cells are clear. Orbits: Left lens replacement is present. Globes and orbits are otherwise within normal limits. Review of the MIP images confirms the above findings CTA NECK FINDINGS Aortic arch: A 3 vessel arch configuration is present. No significant stenosis or aneurysm is present. Right carotid system: The right common carotid artery  is within normal limits. Dense atherosclerotic calcifications are present at the bifurcation without a significant stenosis relative to the more distal vessel. Left carotid system: The left common carotid artery is within normal limits. Atherosclerotic calcifications are present at the bifurcation without a significant luminal stenosis relative to the more distal vessel. Vertebral arteries: The vertebral arteries are codominant. Both vertebral arteries originate from the subclavian arteries without a significant stenosis. There is some tortuosity of the V1 segments bilaterally. No significant stenosis or vessel injury is present to either vertebral artery in the neck. Skeleton: Degenerative anterolisthesis is present at C2-3, C3-4, and C4-5. There is chronic loss of disc height with endplate sclerosis at C5-6  and C6-7. Marked lucency surrounds the roots of the residual right maxillary molar. Dental caries are present without periapical disease. Other neck: The soft tissues the neck are otherwise unremarkable. No significant adenopathy is present. No mucosal lesions are evident. Salivary glands are within normal limits bilaterally. Thyroid is normal. Upper chest: Centrilobular emphysematous changes are present bilaterally. No focal nodule or mass lesion is present. Review of the MIP images confirms the above findings CTA HEAD FINDINGS Anterior circulation: Atherosclerotic calcifications are present within the cavernous internal carotid arteries bilaterally. There is no hyperdense vessel. 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 No definite posterior communicating artery is present. The A1 and M1 segments are normal. The anterior communicating artery is patent. MCA bifurcations are within normal limits bilaterally. The ACA and MCA branch vessels are normal. No focal vascular lesion is evident at the location of the subarachnoid hemorrhage over the right frontal convexity.  Posterior circulation: Vertebral arteries are within normal limits bilaterally. PICA origins are visualized and normal. The basilar artery is normal. Both posterior cerebral arteries originate from the basilar tip. PCA branch vessels are within normal limits bilaterally. Venous sinuses: The dural sinuses are patent. Straight sinus and deep cerebral veins are intact. Cortical veins are normal. Anatomic variants: None Delayed phase: Delayed images demonstrate no pathologic enhancement. White matter disease is accentuated. Review of the MIP images confirms the above findings IMPRESSION: 1. No acute or focal vascular lesion to explain subarachnoid hemorrhage over the convexity of the right frontal lobe. 2. No mass lesion. 3. Stable atrophy and white matter disease. 4. 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 Recommend follow-up CTA of the head in 1 year to assess stability. This aneurysm does not correspond with the area of subarachnoid hemorrhage. Electronically Signed   By: Marin Roberts M.D.   On: 04/23/2018 19:28   Dg Chest 2 View  Result Date: 04/22/2018 CLINICAL DATA:  Intermittent LEFT arm numbness for 2 weeks, history hypertension, diabetes mellitus, anxiety, former smoker EXAM: CHEST - 2 VIEW COMPARISON:  02/12/2018 FINDINGS: Normal heart size, mediastinal contours, and pulmonary vascularity. Atherosclerotic calcification aorta. Lungs hyperinflated with minimal subsegmental atelectasis at LEFT costophrenic angle. Remaining lungs clear. No acute infiltrate, pleural effusion or pneumothorax. Bones appear demineralized with old healed fracture of the posterolateral RIGHT sixth rib. IMPRESSION: Hyperinflated lungs with minimal subsegmental atelectasis at LEFT costophrenic angle. Electronically Signed   By: Ulyses Southward M.D.   On: 04/22/2018 11:07   Ct Head Wo Contrast  Result Date: 04/22/2018 CLINICAL DATA:  Left arm numbness. EXAM: CT HEAD WITHOUT CONTRAST CT  CERVICAL SPINE WITHOUT CONTRAST TECHNIQUE: Multidetector CT imaging of the head and cervical spine was performed following the standard protocol without intravenous contrast. Multiplanar CT image reconstructions of the cervical spine were also generated. COMPARISON:  None. FINDINGS: CT HEAD FINDINGS Brain: Mild diffuse cortical atrophy is noted. Mild chronic ischemic white matter disease is noted. No mass effect or midline shift is noted. Ventricular size is within normal limits. There is no evidence of mass lesion, hemorrhage or acute infarction. Vascular: No hyperdense vessel or unexpected calcification. Skull: Normal. Negative for fracture or focal lesion. Sinuses/Orbits: No acute finding. Other: None. CT CERVICAL SPINE FINDINGS Alignment: Grade 1 anterolisthesis of C3-4 C4-5 is noted secondary to posterior facet joint hypertrophy. Skull base and vertebrae: No acute fracture. No primary bone lesion or focal pathologic process. Soft tissues and spinal canal: No prevertebral fluid or swelling. No visible  canal hematoma. Disc levels: Severe degenerative disc disease is noted at C5-6 and C6-7 with anterior osteophyte formation. Mild degenerative disc disease is noted at C4-5 and C7-T1. Upper chest: Negative. Other: Degenerative changes seen involving posterior facet joints bilaterally, right greater than left. IMPRESSION: Mild diffuse cortical atrophy. Mild chronic ischemic white matter disease. No acute intracranial abnormality seen. Multilevel degenerative disc disease is noted in the cervical spine. No fracture or other acute abnormality is noted. Electronically Signed   By: Lupita RaiderJames  Green Jr, M.D.   On: 04/22/2018 11:09   Ct Angio Neck W Or Wo Contrast  Result Date: 04/23/2018 CLINICAL DATA:  Subarachnoid hemorrhage, proven, follow-up. EXAM: CT ANGIOGRAPHY HEAD AND NECK TECHNIQUE: Multidetector CT imaging of the head and neck was performed using the standard protocol during bolus administration of intravenous  contrast. Multiplanar CT image reconstructions and MIPs were obtained to evaluate the vascular anatomy. Carotid stenosis measurements (when applicable) are obtained utilizing NASCET criteria, using the distal internal carotid diameter as the denominator. CONTRAST:  50mL ISOVUE-370 IOPAMIDOL (ISOVUE-370) INJECTION 76% COMPARISON:  The of conservatively or FINDINGS: CT HEAD FINDINGS Brain: Subarachnoid hemorrhage within the right middle frontal sulcus is stable. No new areas of hemorrhage are present. Atrophy and white matter disease is noted bilaterally. No mass lesion is present. No significant cortical infarct is present. Vascular: Atherosclerotic calcifications are present within the cavernous internal carotid arteries. There is no hyperdense vessel. Skull: Calvarium is intact. No focal lytic or blastic lesions are present. Asymmetric degenerative changes are present at the right TMJ. Sinuses: The paranasal sinuses and mastoid air cells are clear. Orbits: Left lens replacement is present. Globes and orbits are otherwise within normal limits. Review of the MIP images confirms the above findings CTA NECK FINDINGS Aortic arch: A 3 vessel arch configuration is present. No significant stenosis or aneurysm is present. Right carotid system: The right common carotid artery is within normal limits. Dense atherosclerotic calcifications are present at the bifurcation without a significant stenosis relative to the more distal vessel. Left carotid system: The left common carotid artery is within normal limits. Atherosclerotic calcifications are present at the bifurcation without a significant luminal stenosis relative to the more distal vessel. Vertebral arteries: The vertebral arteries are codominant. Both vertebral arteries originate from the subclavian arteries without a significant stenosis. There is some tortuosity of the V1 segments bilaterally. No significant stenosis or vessel injury is present to either vertebral  artery in the neck. Skeleton: Degenerative anterolisthesis is present at C2-3, C3-4, and C4-5. There is chronic loss of disc height with endplate sclerosis at C5-6 and C6-7. Marked lucency surrounds the roots of the residual right maxillary molar. Dental caries are present without periapical disease. Other neck: The soft tissues the neck are otherwise unremarkable. No significant adenopathy is present. No mucosal lesions are evident. Salivary glands are within normal limits bilaterally. Thyroid is normal. Upper chest: Centrilobular emphysematous changes are present bilaterally. No focal nodule or mass lesion is present. Review of the MIP images confirms the above findings CTA HEAD FINDINGS Anterior circulation: Atherosclerotic calcifications are present within the cavernous internal carotid arteries bilaterally. There is no hyperdense vessel. 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 No definite posterior communicating artery is present. The A1 and M1 segments are normal. The anterior communicating artery is patent. MCA bifurcations are within normal limits bilaterally. The ACA and MCA branch vessels are normal. No focal vascular lesion is evident at the location of the subarachnoid hemorrhage over  the right frontal convexity. Posterior circulation: Vertebral arteries are within normal limits bilaterally. PICA origins are visualized and normal. The basilar artery is normal. Both posterior cerebral arteries originate from the basilar tip. PCA branch vessels are within normal limits bilaterally. Venous sinuses: The dural sinuses are patent. Straight sinus and deep cerebral veins are intact. Cortical veins are normal. Anatomic variants: None Delayed phase: Delayed images demonstrate no pathologic enhancement. White matter disease is accentuated. Review of the MIP images confirms the above findings IMPRESSION: 1. No acute or focal vascular lesion to explain subarachnoid hemorrhage  over the convexity of the right frontal lobe. 2. No mass lesion. 3. Stable atrophy and white matter disease. 4. 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 Recommend follow-up CTA of the head in 1 year to assess stability. This aneurysm does not correspond with the area of subarachnoid hemorrhage. Electronically Signed   By: Marin Roberts M.D.   On: 04/23/2018 19:28   Ct Cervical Spine Wo Contrast  Result Date: 04/22/2018 CLINICAL DATA:  Left arm numbness. EXAM: CT HEAD WITHOUT CONTRAST CT CERVICAL SPINE WITHOUT CONTRAST TECHNIQUE: Multidetector CT imaging of the head and cervical spine was performed following the standard protocol without intravenous contrast. Multiplanar CT image reconstructions of the cervical spine were also generated. COMPARISON:  None. FINDINGS: CT HEAD FINDINGS Brain: Mild diffuse cortical atrophy is noted. Mild chronic ischemic white matter disease is noted. No mass effect or midline shift is noted. Ventricular size is within normal limits. There is no evidence of mass lesion, hemorrhage or acute infarction. Vascular: No hyperdense vessel or unexpected calcification. Skull: Normal. Negative for fracture or focal lesion. Sinuses/Orbits: No acute finding. Other: None. CT CERVICAL SPINE FINDINGS Alignment: Grade 1 anterolisthesis of C3-4 C4-5 is noted secondary to posterior facet joint hypertrophy. Skull base and vertebrae: No acute fracture. No primary bone lesion or focal pathologic process. Soft tissues and spinal canal: No prevertebral fluid or swelling. No visible canal hematoma. Disc levels: Severe degenerative disc disease is noted at C5-6 and C6-7 with anterior osteophyte formation. Mild degenerative disc disease is noted at C4-5 and C7-T1. Upper chest: Negative. Other: Degenerative changes seen involving posterior facet joints bilaterally, right greater than left. IMPRESSION: Mild diffuse cortical atrophy. Mild chronic ischemic white  matter disease. No acute intracranial abnormality seen. Multilevel degenerative disc disease is noted in the cervical spine. No fracture or other acute abnormality is noted. Electronically Signed   By: Lupita Raider, M.D.   On: 04/22/2018 11:09   Mr Brain Wo Contrast (neuro Protocol)  Result Date: 04/22/2018 CLINICAL DATA:  Numbness or tingling, paresthesia. Intermittent left arm numbness for 2 weeks EXAM: MRI HEAD WITHOUT CONTRAST TECHNIQUE: Multiplanar, multiecho pulse sequences of the brain and surrounding structures were obtained without intravenous contrast. COMPARISON:  CT from earlier today FINDINGS: Brain: Small focus of subarachnoid FLAIR hyperintensity and gradient hypointensity along the precentral gyrus on the right, this area is high-density by CT. No other site of hemorrhage seen, either acute or chronic. There is mild for age chronic small vessel ischemia in the cerebral white matter. Mild cerebral volume loss. Clustered appearance of gyri at the left vertex, nonspecific and likely noncontributory. No underlying mass is seen and there is no extrinsic mass effect by collection. No hydrocephalus. Vascular: Major flow voids are preserved Skull and upper cervical spine: No evidence of marrow lesion. Diffuse cervical facet spurring with mild C3-4 and C4-5 anterolisthesis. Sinuses/Orbits: Left cataract resection. Other: These results were called  by telephone at the time of interpretation on 04/22/2018 at 2:10 pm to Dr. Samuel JesterKATHLEEN MCMANUS , who verbally acknowledged these results. IMPRESSION: 1. Small volume subarachnoid hemorrhage along the high right frontal convexity. Trauma, coagulopathy, or vasculopathy/amyloid are primary considerations in this location. There is normal superior sagittal sinus flow voids. No acute or remote hemorrhage seen elsewhere. 2. Mild atrophy and chronic small vessel ischemia for age. Electronically Signed   By: Marnee SpringJonathon  Watts M.D.   On: 04/22/2018 14:14    Assessment &  Plan:   There are no diagnoses linked to this encounter.   No orders of the defined types were placed in this encounter.    Follow-up: No follow-ups on file.  Sonda PrimesAlex Daiton Cowles, MD

## 2018-10-07 ENCOUNTER — Other Ambulatory Visit (INDEPENDENT_AMBULATORY_CARE_PROVIDER_SITE_OTHER): Payer: Medicare Other

## 2018-10-07 DIAGNOSIS — G459 Transient cerebral ischemic attack, unspecified: Secondary | ICD-10-CM | POA: Diagnosis not present

## 2018-10-07 DIAGNOSIS — E785 Hyperlipidemia, unspecified: Secondary | ICD-10-CM

## 2018-10-07 LAB — CBC WITH DIFFERENTIAL/PLATELET
Basophils Absolute: 0 10*3/uL (ref 0.0–0.1)
Basophils Relative: 0.6 % (ref 0.0–3.0)
Eosinophils Absolute: 0.2 10*3/uL (ref 0.0–0.7)
Eosinophils Relative: 3.2 % (ref 0.0–5.0)
HCT: 38.2 % (ref 36.0–46.0)
Hemoglobin: 12.7 g/dL (ref 12.0–15.0)
Lymphocytes Relative: 43.3 % (ref 12.0–46.0)
Lymphs Abs: 2.7 10*3/uL (ref 0.7–4.0)
MCHC: 33.2 g/dL (ref 30.0–36.0)
MCV: 84.4 fl (ref 78.0–100.0)
Monocytes Absolute: 0.5 10*3/uL (ref 0.1–1.0)
Monocytes Relative: 7.9 % (ref 3.0–12.0)
Neutro Abs: 2.8 10*3/uL (ref 1.4–7.7)
Neutrophils Relative %: 45 % (ref 43.0–77.0)
Platelets: 323 10*3/uL (ref 150.0–400.0)
RBC: 4.53 Mil/uL (ref 3.87–5.11)
RDW: 14.8 % (ref 11.5–15.5)
WBC: 6.2 10*3/uL (ref 4.0–10.5)

## 2018-10-07 LAB — LIPID PANEL
Cholesterol: 180 mg/dL (ref 0–200)
HDL: 62.7 mg/dL (ref >39.00)
LDL Cholesterol: 98 mg/dL (ref 0–99)
NonHDL: 117.58
Total CHOL/HDL Ratio: 3
Triglycerides: 99 mg/dL (ref 0.0–149.0)
VLDL: 19.8 mg/dL (ref 0.0–40.0)

## 2018-10-07 LAB — BASIC METABOLIC PANEL
BUN: 18 mg/dL (ref 6–23)
CO2: 28 mEq/L (ref 19–32)
Calcium: 9.4 mg/dL (ref 8.4–10.5)
Chloride: 100 mEq/L (ref 96–112)
Creatinine, Ser: 1.13 mg/dL (ref 0.40–1.20)
GFR: 45.69 mL/min — ABNORMAL LOW (ref >60.00)
Glucose, Bld: 90 mg/dL (ref 70–99)
Potassium: 4.1 mEq/L (ref 3.5–5.1)
Sodium: 137 mEq/L (ref 135–145)

## 2018-10-07 LAB — HEPATIC FUNCTION PANEL
ALT: 11 U/L (ref 0–35)
AST: 22 U/L (ref 0–37)
Albumin: 4.2 g/dL (ref 3.5–5.2)
Alkaline Phosphatase: 44 U/L (ref 39–117)
Bilirubin, Direct: 0.1 mg/dL (ref 0.0–0.3)
Total Bilirubin: 0.5 mg/dL (ref 0.2–1.2)
Total Protein: 6.9 g/dL (ref 6.0–8.3)

## 2018-10-07 LAB — TSH: TSH: 3.49 u[IU]/mL (ref 0.35–4.50)

## 2018-10-10 ENCOUNTER — Other Ambulatory Visit: Payer: Self-pay

## 2018-10-10 MED ORDER — LEVETIRACETAM 750 MG PO TABS: 750.0000 mg | ORAL_TABLET | Freq: Two times a day (BID) | ORAL | 3 refills | Status: DC

## 2018-10-11 ENCOUNTER — Telehealth: Payer: Self-pay | Admitting: *Deleted

## 2018-10-11 NOTE — Telephone Encounter (Signed)
Called and informed patient that per Dr. Posey Rea all of her labs are stable. Patient verbalized understanding.

## 2018-10-21 ENCOUNTER — Telehealth: Payer: Self-pay | Admitting: Adult Health

## 2018-10-21 NOTE — Telephone Encounter (Signed)
Pt said she has rec'd a text stating [facility] needs to update her information. She is not going to answer this text  FYI

## 2018-11-11 ENCOUNTER — Encounter: Payer: Self-pay | Admitting: Internal Medicine

## 2018-11-23 ENCOUNTER — Telehealth: Payer: Self-pay | Admitting: Adult Health

## 2018-11-23 NOTE — Telephone Encounter (Signed)
Pt is having eye surgery on 12/01/18 and wants to know if there is any problems with her doing that

## 2018-11-23 NOTE — Telephone Encounter (Signed)
I called pt back regarding eye surgery next week. I stated we treat her seizures only. THe pt was told by her surgeon to continue the seizure medication on the day of surgery. PT was given surgery guidelines by the eye MD. She has her instructions. Pt appreciate the call and verbalized understanding.

## 2018-11-30 ENCOUNTER — Other Ambulatory Visit: Payer: Self-pay | Admitting: Internal Medicine

## 2019-01-31 ENCOUNTER — Telehealth: Payer: Self-pay | Admitting: Adult Health

## 2019-01-31 NOTE — Telephone Encounter (Signed)
Pt has called and asked that if be documented that she would very much prefer that her appointment remain as an in office and not be converted into a virtual visit.

## 2019-01-31 NOTE — Telephone Encounter (Signed)
Due to current COVID 19 pandemic, our office is severely reducing in office visits until further notice, in order to minimize the risk to our patients and healthcare providers.  Pt understands that although there may be some limitations with this type of visit, we will take all precautions to reduce any security or privacy concerns.  Pt understands that this will be treated like an in office visit and we will file with pt's insurance, and there may be a patient responsible charge related to this service.  Pt consented to email [email or web address].  Email [email or web address].  [email or web address]. Email sent. Pt concerned about the aneurysm she has and is requesting to get imaging done to evaluate.

## 2019-01-31 NOTE — Telephone Encounter (Signed)
If she has been stable since prior visit without pertinent concerns and declines virtual visit, recommend rescheduling her office visit appointment for around September/November for office visit follow-up as she is considered a high risk patient

## 2019-02-01 NOTE — Telephone Encounter (Signed)
LMVM for pt checking back to make sure she got email.

## 2019-02-09 ENCOUNTER — Ambulatory Visit: Payer: Medicare Other | Admitting: Internal Medicine

## 2019-02-22 ENCOUNTER — Other Ambulatory Visit: Payer: Self-pay

## 2019-02-22 ENCOUNTER — Other Ambulatory Visit (INDEPENDENT_AMBULATORY_CARE_PROVIDER_SITE_OTHER): Payer: Medicare Other

## 2019-02-22 ENCOUNTER — Ambulatory Visit (INDEPENDENT_AMBULATORY_CARE_PROVIDER_SITE_OTHER): Payer: Medicare Other | Admitting: Internal Medicine

## 2019-02-22 ENCOUNTER — Encounter: Payer: Self-pay | Admitting: Internal Medicine

## 2019-02-22 VITALS — BP 142/84 | HR 60 | Temp 97.8°F | Ht 62.0 in | Wt 115.0 lb

## 2019-02-22 DIAGNOSIS — E785 Hyperlipidemia, unspecified: Secondary | ICD-10-CM

## 2019-02-22 DIAGNOSIS — E119 Type 2 diabetes mellitus without complications: Secondary | ICD-10-CM | POA: Diagnosis not present

## 2019-02-22 DIAGNOSIS — R202 Paresthesia of skin: Secondary | ICD-10-CM | POA: Diagnosis not present

## 2019-02-22 DIAGNOSIS — R5383 Other fatigue: Secondary | ICD-10-CM | POA: Diagnosis not present

## 2019-02-22 DIAGNOSIS — F411 Generalized anxiety disorder: Secondary | ICD-10-CM

## 2019-02-22 DIAGNOSIS — E876 Hypokalemia: Secondary | ICD-10-CM

## 2019-02-22 DIAGNOSIS — E559 Vitamin D deficiency, unspecified: Secondary | ICD-10-CM

## 2019-02-22 DIAGNOSIS — R569 Unspecified convulsions: Secondary | ICD-10-CM | POA: Diagnosis not present

## 2019-02-22 DIAGNOSIS — I10 Essential (primary) hypertension: Secondary | ICD-10-CM

## 2019-02-22 LAB — CK: Total CK: 70 U/L (ref 7–177)

## 2019-02-22 LAB — HEPATIC FUNCTION PANEL
ALT: 12 U/L (ref 0–35)
AST: 22 U/L (ref 0–37)
Albumin: 4.5 g/dL (ref 3.5–5.2)
Alkaline Phosphatase: 52 U/L (ref 39–117)
Bilirubin, Direct: 0.1 mg/dL (ref 0.0–0.3)
Total Bilirubin: 0.6 mg/dL (ref 0.2–1.2)
Total Protein: 7.1 g/dL (ref 6.0–8.3)

## 2019-02-22 LAB — CBC WITH DIFFERENTIAL/PLATELET
Basophils Absolute: 0 10*3/uL (ref 0.0–0.1)
Basophils Relative: 0.3 % (ref 0.0–3.0)
Eosinophils Absolute: 0.2 10*3/uL (ref 0.0–0.7)
Eosinophils Relative: 3 % (ref 0.0–5.0)
HCT: 38 % (ref 36.0–46.0)
Hemoglobin: 12.5 g/dL (ref 12.0–15.0)
Lymphocytes Relative: 37.5 % (ref 12.0–46.0)
Lymphs Abs: 2.9 10*3/uL (ref 0.7–4.0)
MCHC: 32.8 g/dL (ref 30.0–36.0)
MCV: 85.6 fl (ref 78.0–100.0)
Monocytes Absolute: 0.7 10*3/uL (ref 0.1–1.0)
Monocytes Relative: 9.1 % (ref 3.0–12.0)
Neutro Abs: 3.9 10*3/uL (ref 1.4–7.7)
Neutrophils Relative %: 50.1 % (ref 43.0–77.0)
Platelets: 324 10*3/uL (ref 150.0–400.0)
RBC: 4.44 Mil/uL (ref 3.87–5.11)
RDW: 15 % (ref 11.5–15.5)
WBC: 7.8 10*3/uL (ref 4.0–10.5)

## 2019-02-22 LAB — HEMOGLOBIN A1C: Hgb A1c MFr Bld: 6.3 % (ref 4.6–6.5)

## 2019-02-22 LAB — BASIC METABOLIC PANEL
BUN: 14 mg/dL (ref 6–23)
CO2: 27 mEq/L (ref 19–32)
Calcium: 9.5 mg/dL (ref 8.4–10.5)
Chloride: 102 mEq/L (ref 96–112)
Creatinine, Ser: 1.02 mg/dL (ref 0.40–1.20)
GFR: 51.38 mL/min — ABNORMAL LOW (ref >60.00)
Glucose, Bld: 104 mg/dL — ABNORMAL HIGH (ref 70–99)
Potassium: 4.2 mEq/L (ref 3.5–5.1)
Sodium: 137 mEq/L (ref 135–145)

## 2019-02-22 LAB — TSH: TSH: 1.93 u[IU]/mL (ref 0.35–4.50)

## 2019-02-22 LAB — VITAMIN D 25 HYDROXY (VIT D DEFICIENCY, FRACTURES): VITD: 39.64 ng/mL (ref 30.00–100.00)

## 2019-02-22 LAB — VITAMIN B12: Vitamin B-12: 444 pg/mL (ref 211–911)

## 2019-02-22 MED ORDER — ROSUVASTATIN CALCIUM 20 MG PO TABS: ORAL_TABLET | ORAL | 3 refills | Status: DC

## 2019-02-22 MED ORDER — VITAMIN D3 50 MCG (2000 UT) PO CAPS: 2000.0000 [IU] | ORAL_CAPSULE | Freq: Every day | ORAL | 3 refills | Status: DC

## 2019-02-22 NOTE — Assessment & Plan Note (Signed)
Now on Norvasc, Atenolol

## 2019-02-22 NOTE — Assessment & Plan Note (Signed)
Recurrent - ?multifactorial Reduce Crestor labs

## 2019-02-22 NOTE — Assessment & Plan Note (Signed)
Paxil 

## 2019-02-22 NOTE — Assessment & Plan Note (Signed)
Labs

## 2019-02-22 NOTE — Assessment & Plan Note (Signed)
-   Crestor 

## 2019-02-22 NOTE — Progress Notes (Signed)
Subjective:  Patient ID: Vickie CallerJackie M Wilson, female    DOB: 02/01/1931  Age: 85 y.o. MRN: [id number]  CC: No chief complaint on file.   HPI Vickie Wilson presents for HTN, dyslipidemia, seizures f/u C/o arthralgia and burning   Outpatient Medications Prior to Visit  Medication Sig Dispense Refill   amLODipine (NORVASC) 5 MG tablet Take 1 tablet (5 mg total) by mouth daily. 90 tablet 3   atenolol (TENORMIN) 100 MG tablet Take 0.5 tablets (50 mg total) by mouth 2 (two) times daily. 90 tablet 3   levETIRAcetam (KEPPRA) 750 MG tablet Take 1 tablet (750 mg total) by mouth 2 (two) times daily. 180 tablet 3   Multiple Vitamins-Minerals (CENTRUM SILVER) CHEW Chew 1 each by mouth 3 (three) times a week.      PARoxetine (PAXIL) 10 MG tablet TAKE 1 TABLET IN THE MORNING. 90 tablet 3   rosuvastatin (CRESTOR) 20 MG tablet Take 0.5 tablets (10 mg total) by mouth every evening. 45 tablet 3   sennosides-docusate sodium (SENOKOT-S) 8.6-50 MG tablet Take 2 tablets by mouth at bedtime.     No facility-administered medications prior to visit.     ROS: Review of Systems  Constitutional: Negative for activity change, appetite change, chills, fatigue and unexpected weight change.  HENT: Negative for congestion, mouth sores and sinus pressure.   Eyes: Negative for visual disturbance.  Respiratory: Negative for cough and chest tightness.   Gastrointestinal: Negative for abdominal pain and nausea.  Genitourinary: Negative for difficulty urinating, frequency and vaginal pain.  Musculoskeletal: Positive for arthralgias. Negative for back pain and gait problem.  Skin: Negative for pallor and rash.  Neurological: Negative for dizziness, tremors, weakness, numbness and headaches.  Psychiatric/Behavioral: Negative for confusion, sleep disturbance and suicidal ideas.    Objective:  BP (!) 142/84 (BP Location: Left Arm, Patient Position: Sitting, Cuff Size: Normal)    Pulse 60    Temp 97.8 F (36.6 C)  (Oral)    Ht 5\' 2"  (1.575 m)    Wt 115 lb (52.2 kg)    SpO2 98%    BMI 21.03 kg/m   BP Readings from Last 3 Encounters:  02/22/19 (!) 142/84  10/05/18 128/78  10/05/18 128/78    Wt Readings from Last 3 Encounters:  02/22/19 115 lb (52.2 kg)  10/05/18 112 lb (50.8 kg)  10/05/18 112 lb (50.8 kg)    Physical Exam Constitutional:      General: She is not in acute distress.    Appearance: She is well-developed.  HENT:     Head: Normocephalic.     Right Ear: External ear normal.     Left Ear: External ear normal.     Nose: Nose normal.  Eyes:     General:        Right eye: No discharge.        Left eye: No discharge.     Conjunctiva/sclera: Conjunctivae normal.     Pupils: Pupils are equal, round, and reactive to light.  Neck:     Musculoskeletal: Normal range of motion and neck supple.     Thyroid: No thyromegaly.     Vascular: No JVD.     Trachea: No tracheal deviation.  Cardiovascular:     Rate and Rhythm: Normal rate and regular rhythm.     Heart sounds: Normal heart sounds.  Pulmonary:     Effort: No respiratory distress.     Breath sounds: No stridor. No wheezing.  Abdominal:  General: Bowel sounds are normal. There is no distension.     Palpations: Abdomen is soft. There is no mass.     Tenderness: There is no abdominal tenderness. There is no guarding or rebound.  Musculoskeletal:        General: No tenderness.  Lymphadenopathy:     Cervical: No cervical adenopathy.  Skin:    Findings: No erythema or rash.  Neurological:     Cranial Nerves: No cranial nerve deficit.     Motor: No abnormal muscle tone.     Coordination: Coordination normal.     Deep Tendon Reflexes: Reflexes normal.  Psychiatric:        Behavior: Behavior normal.        Thought Content: Thought content normal.        Judgment: Judgment normal.     Lab Results  Component Value Date   WBC 6.2 10/07/2018   HGB 12.7 10/07/2018   HCT 38.2 10/07/2018   PLT 323.0 10/07/2018   GLUCOSE  90 10/07/2018   CHOL 180 10/07/2018   TRIG 99.0 10/07/2018   HDL 62.70 10/07/2018   LDLDIRECT 125.6 01/16/2013   LDLCALC 98 10/07/2018   ALT 11 10/07/2018   AST 22 10/07/2018   NA 137 10/07/2018   K 4.1 10/07/2018   CL 100 10/07/2018   CREATININE 1.13 10/07/2018   BUN 18 10/07/2018   CO2 28 10/07/2018   TSH 3.49 10/07/2018   INR 0.92 04/22/2018   HGBA1C 6.3 03/02/2018   MICROALBUR 2.8 (H) 09/17/2016    Ct Angio Head W Or Wo Contrast  Result Date: 04/23/2018 CLINICAL DATA:  Subarachnoid hemorrhage, proven, follow-up. EXAM: CT ANGIOGRAPHY HEAD AND NECK TECHNIQUE: Multidetector CT imaging of the head and neck was performed using the standard protocol during bolus administration of intravenous contrast. Multiplanar CT image reconstructions and MIPs were obtained to evaluate the vascular anatomy. Carotid stenosis measurements (when applicable) are obtained utilizing NASCET criteria, using the distal internal carotid diameter as the denominator. CONTRAST:  50mL ISOVUE-370 IOPAMIDOL (ISOVUE-370) INJECTION 76% COMPARISON:  The of conservatively or FINDINGS: CT HEAD FINDINGS Brain: Subarachnoid hemorrhage within the right middle frontal sulcus is stable. No new areas of hemorrhage are present. Atrophy and white matter disease is noted bilaterally. No mass lesion is present. No significant cortical infarct is present. Vascular: Atherosclerotic calcifications are present within the cavernous internal carotid arteries. There is no hyperdense vessel. Skull: Calvarium is intact. No focal lytic or blastic lesions are present. Asymmetric degenerative changes are present at the right TMJ. Sinuses: The paranasal sinuses and mastoid air cells are clear. Orbits: Left lens replacement is present. Globes and orbits are otherwise within normal limits. Review of the MIP images confirms the above findings CTA NECK FINDINGS Aortic arch: A 3 vessel arch configuration is present. No significant stenosis or aneurysm is  present. Right carotid system: The right common carotid artery is within normal limits. Dense atherosclerotic calcifications are present at the bifurcation without a significant stenosis relative to the more distal vessel. Left carotid system: The left common carotid artery is within normal limits. Atherosclerotic calcifications are present at the bifurcation without a significant luminal stenosis relative to the more distal vessel. Vertebral arteries: The vertebral arteries are codominant. Both vertebral arteries originate from the subclavian arteries without a significant stenosis. There is some tortuosity of the V1 segments bilaterally. No significant stenosis or vessel injury is present to either vertebral artery in the neck. Skeleton: Degenerative anterolisthesis is present at C2-3, C3-4, and C4-5.  There is chronic loss of disc height with endplate sclerosis at C5-6 and C6-7. Marked lucency surrounds the roots of the residual right maxillary molar. Dental caries are present without periapical disease. Other neck: The soft tissues the neck are otherwise unremarkable. No significant adenopathy is present. No mucosal lesions are evident. Salivary glands are within normal limits bilaterally. Thyroid is normal. Upper chest: Centrilobular emphysematous changes are present bilaterally. No focal nodule or mass lesion is present. Review of the MIP images confirms the above findings CTA HEAD FINDINGS Anterior circulation: Atherosclerotic calcifications are present within the cavernous internal carotid arteries bilaterally. There is no hyperdense vessel. 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 No definite posterior communicating artery is present. The A1 and M1 segments are normal. The anterior communicating artery is patent. MCA bifurcations are within normal limits bilaterally. The ACA and MCA branch vessels are normal. No focal vascular lesion is evident at the location of the  subarachnoid hemorrhage over the right frontal convexity. Posterior circulation: Vertebral arteries are within normal limits bilaterally. PICA origins are visualized and normal. The basilar artery is normal. Both posterior cerebral arteries originate from the basilar tip. PCA branch vessels are within normal limits bilaterally. Venous sinuses: The dural sinuses are patent. Straight sinus and deep cerebral veins are intact. Cortical veins are normal. Anatomic variants: None Delayed phase: Delayed images demonstrate no pathologic enhancement. White matter disease is accentuated. Review of the MIP images confirms the above findings IMPRESSION: 1. No acute or focal vascular lesion to explain subarachnoid hemorrhage over the convexity of the right frontal lobe. 2. No mass lesion. 3. Stable atrophy and white matter disease. 4. 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 Recommend follow-up CTA of the head in 1 year to assess stability. This aneurysm does not correspond with the area of subarachnoid hemorrhage. Electronically Signed   By: Marin Roberts M.D.   On: 04/23/2018 19:28   Dg Chest 2 View  Result Date: 04/22/2018 CLINICAL DATA:  Intermittent LEFT arm numbness for 2 weeks, history hypertension, diabetes mellitus, anxiety, former smoker EXAM: CHEST - 2 VIEW COMPARISON:  02/12/2018 FINDINGS: Normal heart size, mediastinal contours, and pulmonary vascularity. Atherosclerotic calcification aorta. Lungs hyperinflated with minimal subsegmental atelectasis at LEFT costophrenic angle. Remaining lungs clear. No acute infiltrate, pleural effusion or pneumothorax. Bones appear demineralized with old healed fracture of the posterolateral RIGHT sixth rib. IMPRESSION: Hyperinflated lungs with minimal subsegmental atelectasis at LEFT costophrenic angle. Electronically Signed   By: Ulyses Southward M.D.   On: 04/22/2018 11:07   Ct Head Wo Contrast  Result Date: 04/22/2018 CLINICAL DATA:   Left arm numbness. EXAM: CT HEAD WITHOUT CONTRAST CT CERVICAL SPINE WITHOUT CONTRAST TECHNIQUE: Multidetector CT imaging of the head and cervical spine was performed following the standard protocol without intravenous contrast. Multiplanar CT image reconstructions of the cervical spine were also generated. COMPARISON:  None. FINDINGS: CT HEAD FINDINGS Brain: Mild diffuse cortical atrophy is noted. Mild chronic ischemic white matter disease is noted. No mass effect or midline shift is noted. Ventricular size is within normal limits. There is no evidence of mass lesion, hemorrhage or acute infarction. Vascular: No hyperdense vessel or unexpected calcification. Skull: Normal. Negative for fracture or focal lesion. Sinuses/Orbits: No acute finding. Other: None. CT CERVICAL SPINE FINDINGS Alignment: Grade 1 anterolisthesis of C3-4 C4-5 is noted secondary to posterior facet joint hypertrophy. Skull base and vertebrae: No acute fracture. No primary bone lesion or focal pathologic process.  Soft tissues and spinal canal: No prevertebral fluid or swelling. No visible canal hematoma. Disc levels: Severe degenerative disc disease is noted at C5-6 and C6-7 with anterior osteophyte formation. Mild degenerative disc disease is noted at C4-5 and C7-T1. Upper chest: Negative. Other: Degenerative changes seen involving posterior facet joints bilaterally, right greater than left. IMPRESSION: Mild diffuse cortical atrophy. Mild chronic ischemic white matter disease. No acute intracranial abnormality seen. Multilevel degenerative disc disease is noted in the cervical spine. No fracture or other acute abnormality is noted. Electronically Signed   By: Marijo Conception, M.D.   On: 04/22/2018 11:09   Ct Angio Neck W Or Wo Contrast  Result Date: 04/23/2018 CLINICAL DATA:  Subarachnoid hemorrhage, proven, follow-up. EXAM: CT ANGIOGRAPHY HEAD AND NECK TECHNIQUE: Multidetector CT imaging of the head and neck was performed using the standard  protocol during bolus administration of intravenous contrast. Multiplanar CT image reconstructions and MIPs were obtained to evaluate the vascular anatomy. Carotid stenosis measurements (when applicable) are obtained utilizing NASCET criteria, using the distal internal carotid diameter as the denominator. CONTRAST:  55mL ISOVUE-370 IOPAMIDOL (ISOVUE-370) INJECTION 76% COMPARISON:  The of conservatively or FINDINGS: CT HEAD FINDINGS Brain: Subarachnoid hemorrhage within the right middle frontal sulcus is stable. No new areas of hemorrhage are present. Atrophy and white matter disease is noted bilaterally. No mass lesion is present. No significant cortical infarct is present. Vascular: Atherosclerotic calcifications are present within the cavernous internal carotid arteries. There is no hyperdense vessel. Skull: Calvarium is intact. No focal lytic or blastic lesions are present. Asymmetric degenerative changes are present at the right TMJ. Sinuses: The paranasal sinuses and mastoid air cells are clear. Orbits: Left lens replacement is present. Globes and orbits are otherwise within normal limits. Review of the MIP images confirms the above findings CTA NECK FINDINGS Aortic arch: A 3 vessel arch configuration is present. No significant stenosis or aneurysm is present. Right carotid system: The right common carotid artery is within normal limits. Dense atherosclerotic calcifications are present at the bifurcation without a significant stenosis relative to the more distal vessel. Left carotid system: The left common carotid artery is within normal limits. Atherosclerotic calcifications are present at the bifurcation without a significant luminal stenosis relative to the more distal vessel. Vertebral arteries: The vertebral arteries are codominant. Both vertebral arteries originate from the subclavian arteries without a significant stenosis. There is some tortuosity of the V1 segments bilaterally. No significant stenosis  or vessel injury is present to either vertebral artery in the neck. Skeleton: Degenerative anterolisthesis is present at C2-3, C3-4, and C4-5. There is chronic loss of disc height with endplate sclerosis at X8-3 and C6-7. Marked lucency surrounds the roots of the residual right maxillary molar. Dental caries are present without periapical disease. Other neck: The soft tissues the neck are otherwise unremarkable. No significant adenopathy is present. No mucosal lesions are evident. Salivary glands are within normal limits bilaterally. Thyroid is normal. Upper chest: Centrilobular emphysematous changes are present bilaterally. No focal nodule or mass lesion is present. Review of the MIP images confirms the above findings CTA HEAD FINDINGS Anterior circulation: Atherosclerotic calcifications are present within the cavernous internal carotid arteries bilaterally. There is no hyperdense vessel. 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 No definite posterior communicating artery is present. The A1 and M1 segments are normal. The anterior communicating artery is patent. MCA bifurcations are within normal limits bilaterally. The ACA and MCA branch vessels are normal. No focal  vascular lesion is evident at the location of the subarachnoid hemorrhage over the right frontal convexity. Posterior circulation: Vertebral arteries are within normal limits bilaterally. PICA origins are visualized and normal. The basilar artery is normal. Both posterior cerebral arteries originate from the basilar tip. PCA branch vessels are within normal limits bilaterally. Venous sinuses: The dural sinuses are patent. Straight sinus and deep cerebral veins are intact. Cortical veins are normal. Anatomic variants: None Delayed phase: Delayed images demonstrate no pathologic enhancement. White matter disease is accentuated. Review of the MIP images confirms the above findings IMPRESSION: 1. No acute or focal  vascular lesion to explain subarachnoid hemorrhage over the convexity of the right frontal lobe. 2. No mass lesion. 3. Stable atrophy and white matter disease. 4. 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 Recommend follow-up CTA of the head in 1 year to assess stability. This aneurysm does not correspond with the area of subarachnoid hemorrhage. Electronically Signed   By: Marin Robertshristopher  Mattern M.D.   On: 04/23/2018 19:28   Ct Cervical Spine Wo Contrast  Result Date: 04/22/2018 CLINICAL DATA:  Left arm numbness. EXAM: CT HEAD WITHOUT CONTRAST CT CERVICAL SPINE WITHOUT CONTRAST TECHNIQUE: Multidetector CT imaging of the head and cervical spine was performed following the standard protocol without intravenous contrast. Multiplanar CT image reconstructions of the cervical spine were also generated. COMPARISON:  None. FINDINGS: CT HEAD FINDINGS Brain: Mild diffuse cortical atrophy is noted. Mild chronic ischemic white matter disease is noted. No mass effect or midline shift is noted. Ventricular size is within normal limits. There is no evidence of mass lesion, hemorrhage or acute infarction. Vascular: No hyperdense vessel or unexpected calcification. Skull: Normal. Negative for fracture or focal lesion. Sinuses/Orbits: No acute finding. Other: None. CT CERVICAL SPINE FINDINGS Alignment: Grade 1 anterolisthesis of C3-4 C4-5 is noted secondary to posterior facet joint hypertrophy. Skull base and vertebrae: No acute fracture. No primary bone lesion or focal pathologic process. Soft tissues and spinal canal: No prevertebral fluid or swelling. No visible canal hematoma. Disc levels: Severe degenerative disc disease is noted at C5-6 and C6-7 with anterior osteophyte formation. Mild degenerative disc disease is noted at C4-5 and C7-T1. Upper chest: Negative. Other: Degenerative changes seen involving posterior facet joints bilaterally, right greater than left. IMPRESSION: Mild diffuse  cortical atrophy. Mild chronic ischemic white matter disease. No acute intracranial abnormality seen. Multilevel degenerative disc disease is noted in the cervical spine. No fracture or other acute abnormality is noted. Electronically Signed   By: Lupita RaiderJames  Green Jr, M.D.   On: 04/22/2018 11:09   Mr Brain Wo Contrast (neuro Protocol)  Result Date: 04/22/2018 CLINICAL DATA:  Numbness or tingling, paresthesia. Intermittent left arm numbness for 2 weeks EXAM: MRI HEAD WITHOUT CONTRAST TECHNIQUE: Multiplanar, multiecho pulse sequences of the brain and surrounding structures were obtained without intravenous contrast. COMPARISON:  CT from earlier today FINDINGS: Brain: Small focus of subarachnoid FLAIR hyperintensity and gradient hypointensity along the precentral gyrus on the right, this area is high-density by CT. No other site of hemorrhage seen, either acute or chronic. There is mild for age chronic small vessel ischemia in the cerebral white matter. Mild cerebral volume loss. Clustered appearance of gyri at the left vertex, nonspecific and likely noncontributory. No underlying mass is seen and there is no extrinsic mass effect by collection. No hydrocephalus. Vascular: Major flow voids are preserved Skull and upper cervical spine: No evidence of marrow lesion. Diffuse cervical facet spurring with mild C3-4  and C4-5 anterolisthesis. Sinuses/Orbits: Left cataract resection. Other: These results were called by telephone at the time of interpretation on 04/22/2018 at 2:10 pm to Dr. Samuel JesterKATHLEEN MCMANUS , who verbally acknowledged these results. IMPRESSION: 1. Small volume subarachnoid hemorrhage along the high right frontal convexity. Trauma, coagulopathy, or vasculopathy/amyloid are primary considerations in this location. There is normal superior sagittal sinus flow voids. No acute or remote hemorrhage seen elsewhere. 2. Mild atrophy and chronic small vessel ischemia for age. Electronically Signed   By: Marnee SpringJonathon  Watts M.D.    On: 04/22/2018 14:14    Assessment & Plan:   There are no diagnoses linked to this encounter.   No orders of the defined types were placed in this encounter.    Follow-up: No follow-ups on file.  Sonda PrimesAlex Eleanna Theilen, MD

## 2019-02-22 NOTE — Assessment & Plan Note (Signed)
Keppra

## 2019-02-22 NOTE — Patient Instructions (Signed)
Stop Rosuvastatin x 1 week, then re-start at 0.5 tablet M-W-F

## 2019-02-22 NOTE — Assessment & Plan Note (Signed)
Lab

## 2019-02-28 ENCOUNTER — Telehealth: Payer: Self-pay

## 2019-02-28 NOTE — Telephone Encounter (Signed)
Left vm for patient to call back to update med list for video appt tomorrow with provider.

## 2019-02-28 NOTE — Telephone Encounter (Signed)
I called pt to r/s appt per provider because of moderate covid 19 risk score. Pt unable to do video. Pt r/s for July in office visit.

## 2019-02-28 NOTE — Telephone Encounter (Signed)
Error

## 2019-02-28 NOTE — Telephone Encounter (Signed)
I receive call from pt to update medication list. I spoke with pt and updated all of her medications. Pt stated she probably will not be able to do video visit tomorrow but can do a telephone call. Pt stated her daughter has a lap top and will be unable to come to her house. Her daughters husband may have been exposed to [place] at work, and it will be unsafe for her to be at her house. The pt stated she has a android but her phone cuts off a lot and knows it may not work tomorrow for the visit. She wanted a telephone visit with JEssica NP. I stated Janett Billow will evaluated her chart and we will call her back about her appt.

## 2019-02-28 NOTE — Telephone Encounter (Signed)
Due to patient's age and moderate COVID-19 risk score with potential exposure from family member, recommend to reschedule office visit as he is unable to participate in virtual visit.

## 2019-03-01 ENCOUNTER — Ambulatory Visit: Payer: Self-pay | Admitting: Adult Health

## 2019-03-22 ENCOUNTER — Other Ambulatory Visit: Payer: Self-pay | Admitting: Internal Medicine

## 2019-03-27 ENCOUNTER — Other Ambulatory Visit: Payer: Self-pay

## 2019-03-27 ENCOUNTER — Ambulatory Visit: Payer: Medicare Other | Admitting: Neurology

## 2019-03-27 ENCOUNTER — Encounter: Payer: Self-pay | Admitting: Neurology

## 2019-03-27 VITALS — BP 155/80 | HR 57 | Temp 98.0°F | Wt 119.0 lb

## 2019-03-27 DIAGNOSIS — G40109 Localization-related (focal) (partial) symptomatic epilepsy and epileptic syndromes with simple partial seizures, not intractable, without status epilepticus: Secondary | ICD-10-CM | POA: Diagnosis not present

## 2019-03-27 MED ORDER — LEVETIRACETAM 750 MG PO TABS: 750.0000 mg | ORAL_TABLET | Freq: Two times a day (BID) | ORAL | 3 refills | Status: DC

## 2019-03-27 NOTE — Progress Notes (Signed)
[facility] [address] [place]. [place] [address] [phone]       OFFICE CONSULT NOTE  Vickie Wilson Date of Birth:  02/01/1931 Medical Record Number:  [id number]   Referring MD:  Albertine GratesFang Xu  Reason for Referral:  seizure  HPI initial visit 05/26/2018 PS: Vickie Wilson is a pleasant [age] year Caucasian lady seen today for initial consultation visit following hospital admission recently for paresthesias. History is provided by the patient and review of electronic medical records. I personally reviewed imaging films.Vickie Wilson is an 85 y.o. female past medical history of hypertension, hyperlipidemia, diabetes mellitus, atrial mass who presents to the emergency room on 04/22/18 after having multiple spells of numbness over the left hand extending to the arm as well as left facial numbness.She stated her first episode was about 1 week ago lasting about 15 minutes mainly if he affecting her left hand, arm as well as the left side of her face. Resolved spontaneously.  Continued to have 3 more episodes and therefore today she decided to come to the hospital.  She denies any weakness, however states she was not able to use her hand like she normally could.  Patient denies any jerking/twitching movements of the left arm or face. Work-up in the ER included MRI brain which revealed a small area of subarachnoid hemorrhage on MRI brain in the right frontal lobe convexity only. This was difficult to visualize on the CT scan corresponding cuts..  Her CT scan was   negative h .  She denied any history of recent head trauma, however she had a fall few weeks ago. She was on ASA at home. EEG done on 04/23/18 was normal. Transthoracic echo showed normal ejection fraction. Patient was started on Keppra 500 twice daily and had no further episodes after that was started. CT angiogram of the brain showed no large vessel stenosis or occlusion but showed a small 2 mm left posterior communicating artery  aneurysm which was incidental. Neurosurgery was consulted and recommended conservative follow-up for the same. Patient states she was doing well after discharge until 5 days ago when she had 2 transient episodes of numbness in her left hand gradually going up to her left elbow but this time not involving her face. This occurred twice on one day and then once the next day. She has not had these episodes for the last 3 days at least. She remains on Keppra 500 twice daily which is tolerating well without any side effects. She states she has not missed any doses.  Interval history 08/30/2018: Patient is being seen today for 3668-month follow-up visit.  She did undergo repeat EEG which was normal without evidence of seizure activity.  She did increase keppra after prior appointment.  Approximately 3 to 4 weeks ago, patient did experience episode of left hand numbness which lasted approximately 15 seconds and then resolved.  She denies any additional episode occurring.  She does endorse fatigue during the day and tiring quicker than before but she is unsure if this is due to medications versus old age.  She does experience some unsteadiness at times but [facility] also [facility].  She did undergo a couple physical therapy sessions but financially was unable to continue.  She did receive handout regarding exercises to do at home to assist with balance but she does admit to not doing this.  She does state that she consistently stays busy as she does  live independently and manages all ADLs and IADLs.  She is able to continue to ambulate without assistive device and denies any recent falls.  She also states that intermittently she will have a burning sensation/sharp pinprick sensation on her left middle finger knuckle and left AC area which may only last for 1 to 2 minutes and then subside.  She believes that this pain could be due to arthritis and is not overly concerned as it happens  infrequently.  Pressure today 141/80.  She continues on Crestor for HLD management.  No further concerns at this time.  Denies new or worsening stroke/TIA symptoms or seizure activity.  Update 03/27/2019 : She returns for follow-up after last visit in December 2019 with Janett Billow, nurse practitioner.  She is doing well and has not had any recurrent episodes of sensory seizures or any other neurological issues.  She remains on Keppra 750 mg twice daily which she is tolerating well without dizziness or other side effects.  She woke up 1 day about a month ago with feeling of coldness and heaviness in both her feet but this does not last long and cleared in an hour or 2.  She did not seek medical help.  She saw her primary care physician few weeks ago who did some lab work all of which was satisfactory.  She states her blood pressure usually runs good but today it is slightly elevated in our office and 155/80 she feels this may be due to white coat hypertension.  She has no new complaints today.   ROS:   14 system review of systems is positive for numbness,  and walking difficulty only and all other systems negative  PMH:  Past Medical History:  Diagnosis Date  . Allergy   . Anxiety   . Atrial mass    right  . Constipation   . Depression   . Diabetes mellitus   . Hyperlipidemia   . Hypertension   . Osteoporosis   . Seizures ([place])     Social History:  Social History   Socioeconomic History  . Marital status: Single    Spouse name: Not on file  . Number of children: 1  . Years of education: Not on file  . Highest education level: 12th grade  Occupational History  . Occupation: retired  Scientific laboratory technician  . Financial resource strain: Not very hard  . Food insecurity    Worry: Never true    Inability: Never true  . Transportation needs    Medical: No    Non-medical: No  Tobacco Use  . Smoking status: Former Smoker    Packs/day: 1.00    Years: 20.00    Pack years: 20.00    Quit date:  03/13/1998    Years since quitting: 21.0  . Smokeless tobacco: Never Used  Substance and Sexual Activity  . Alcohol use: No  . Drug use: No  . Sexual activity: Not Currently  Lifestyle  . Physical activity    Days per week: 0 days    Minutes per session: 0 min  . Stress: Not at all  Relationships  . Social connections    Talks on phone: More than three times a week    Gets together: More than three times a week    Attends religious service: More than 4 times per year    Active member of club or organization: Yes    Attends meetings of clubs or organizations: More than 4 times per year  Relationship status: Not on file  . Intimate partner violence    Fear of current or ex partner: Not on file    Emotionally abused: Not on file    Physically abused: Not on file    Forced sexual activity: Not on file  Other Topics Concern  . Not on file  Social History Narrative   Regular exercise-yes      Daily caffeine use      Right handed       Lives at home alone     Medications:   Current Outpatient Medications on File Prior to Visit  Medication Sig Dispense Refill  . amLODipine (NORVASC) 5 MG tablet Take 1 tablet (5 mg total) by mouth daily. 90 tablet 3  . atenolol (TENORMIN) 100 MG tablet Take 0.5 tablets (50 mg total) by mouth 2 (two) times daily. 90 tablet 3  . Cholecalciferol (VITAMIN D3) 50 MCG (2000 UT) capsule Take 1 capsule (2,000 Units total) by mouth daily. 100 capsule 3  . Multiple Vitamins-Minerals (CENTRUM SILVER) CHEW Chew 1 each by mouth 3 (three) times a week.     Vickie Kitchen. PARoxetine (PAXIL) 10 MG tablet TAKE 1 TABLET IN THE MORNING. 90 tablet 3  . rosuvastatin (CRESTOR) 20 MG tablet 0.5 tablet M-W-F 45 tablet 3   No current facility-administered medications on file prior to visit.     Allergies:   Allergies  Allergen Reactions  . Captopril     REACTION: cough    Physical Exam General: frail petite elderly Caucasian lady, seated, in no evident distress Head:  head normocephalic and atraumatic.   Neck: supple with no carotid or supraclavicular bruits Cardiovascular: regular rate and rhythm, no murmurs Musculoskeletal: mild kyphosis Skin:  no rash/petichiae Vascular:  Normal pulses all extremities  Neurologic Exam Mental Status: Awake and fully alert. Oriented to place and time. Recent and remote memory intact. Attention span, concentration and fund of knowledge appropriate. Mood and affect appropriate.  Cranial Nerves: Fundoscopic exam not done. Pupils equal, briskly reactive to light. Extraocular movements full without nystagmus. Visual fields full to confrontation. Hearing diminished bilaterally. Facial sensation intact. Face, tongue, palate moves normally and symmetrically.  Motor: Normal bulk and tone. Normal strength in all tested extremity muscles. Sensory.: intact to touch , pinprick , position and vibratory sensation.  Coordination: Rapid alternating movements normal in all extremities. Finger-to-nose and heel-to-shin performed accurately bilaterally. Gait and Station: Arises from chair without difficulty. Stance is normal. Gait demonstrates normal stride length and balance .  Unable to heel, toe and tandem walk due to difficulty Reflexes: 1+ and symmetric. Toes downgoing.      ASSESSMENT: 85 year old lady with recurrent fleeting left arm and face paresthesias likely simple partial seizures likely symptomatic from small localized right frontal convexity subarachnoid hemorrhage which is non-aneurysmal and non-traumatic in etiology.. 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  She had only one breakthrough 1 simple partial seizure episode since hospital admission in August 2019 consisting of left hand numbness and continues on Keppra 750 mg twice daily.    PLAN: -I had a long discussion with the patient regarding her remote tiny  subarachnoid hemorrhage as well as simple partial seizures which appear well-controlled on Keppra.  Continue Keppra 750 mg twice daily which is tolerating well without side effects.  She was advised to maintain strict control of hypertension with blood pressure goal below 130/90.  She will return for  follow-up in the future in a year with Shanda BumpsJessica and nurse practitioner call earlier if necessary.Greater than 50% time during this 45 minute consultation visit was spent on counseling and coordination of care about her paresthesias and simple partial seizures and answering questions.  Greater than 50% time during this 25-minute visit was spent on counseling and coordination of care about her remote subarachnoid hemorrhage and simple partial seizures and answering questions she will return for follow-up with my nurse practitioner  Shanda BumpsJessica in 3 months or call earlier if necessary.  Delia HeadyPramod Darchelle Nunes, MD  [facility] [address] [place], [place] [id number]  Phone [phone] Fax [phone] Note: This document was prepared with digital dictation and possible smart phrase technology. Any transcriptional errors that result from this process are unintentional.

## 2019-03-27 NOTE — Patient Instructions (Signed)
I had a long discussion with the patient regarding her remote tiny subarachnoid hemorrhage as well as simple partial seizures which appear well-controlled on Keppra.  Continue Keppra 750 mg twice daily which is tolerating well without side effects.  She was advised to maintain strict control of hypertension with blood pressure goal below 130/90.  She will return for follow-up in the future in a year with Janett Billow and nurse practitioner call earlier if necessary.

## 2019-04-04 ENCOUNTER — Ambulatory Visit: Payer: Self-pay | Admitting: Adult Health

## 2019-04-06 NOTE — Progress Notes (Signed)
This encounter was created in error - please disregard.

## 2019-04-19 ENCOUNTER — Telehealth: Payer: Self-pay | Admitting: Internal Medicine

## 2019-04-19 NOTE — Telephone Encounter (Signed)
I called pt- she c/o bilateral leg weakness in the mornings after waking up. She wants to know if she should or can sleep with her legs elevated or any other advise you can give her. Please advise.

## 2019-04-19 NOTE — Telephone Encounter (Signed)
Hold Rosuvastatin x 4 wks Sch ROV Thx

## 2019-04-19 NOTE — Telephone Encounter (Signed)
Pt need to speak to you concerning her TIA. Please call pt

## 2019-04-20 NOTE — Telephone Encounter (Signed)
Left detailed message informing pt of below and to call back to schedule OV with PCP. CRM created.

## 2019-04-28 DIAGNOSIS — H2511 Age-related nuclear cataract, right eye: Secondary | ICD-10-CM | POA: Diagnosis not present

## 2019-05-03 ENCOUNTER — Telehealth: Payer: Self-pay | Admitting: Internal Medicine

## 2019-05-03 DIAGNOSIS — I700 Atherosclerosis of aorta: Secondary | ICD-10-CM

## 2019-05-03 DIAGNOSIS — E785 Hyperlipidemia, unspecified: Secondary | ICD-10-CM

## 2019-05-03 NOTE — Telephone Encounter (Signed)
Pt has been holding on taking  rosuvastatin (CRESTOR) 20 MG tablet And wants to know does she need to schedule an appt to come in soon / please advise

## 2019-05-03 NOTE — Telephone Encounter (Signed)
According to my last note, the patient was supposed to Stop Rosuvastatin x 1 week, then re-start at 0.5 tablet M-W-F She can restart rosuvastatin as above and come to see me in 2 weeks with lipids and liver tests prior.  Thank you

## 2019-05-04 NOTE — Telephone Encounter (Signed)
Lab orders placed.  

## 2019-05-04 NOTE — Addendum Note (Signed)
Addended by: Terence Lux B on: 05/04/2019 02:48 PM   Modules accepted: Orders

## 2019-05-04 NOTE — Telephone Encounter (Signed)
Called and left message for patient with info. Asked her to contact office and schedule 2 week follow up appointment with Dr. Alain Marion in 2 weeks and to get labs prior to visit.

## 2019-05-11 DIAGNOSIS — H25811 Combined forms of age-related cataract, right eye: Secondary | ICD-10-CM | POA: Diagnosis not present

## 2019-05-11 DIAGNOSIS — H2511 Age-related nuclear cataract, right eye: Secondary | ICD-10-CM | POA: Diagnosis not present

## 2019-05-18 ENCOUNTER — Encounter: Payer: Self-pay | Admitting: Internal Medicine

## 2019-05-18 ENCOUNTER — Ambulatory Visit (INDEPENDENT_AMBULATORY_CARE_PROVIDER_SITE_OTHER): Payer: Medicare Other | Admitting: Internal Medicine

## 2019-05-18 ENCOUNTER — Other Ambulatory Visit: Payer: Self-pay

## 2019-05-18 ENCOUNTER — Other Ambulatory Visit (INDEPENDENT_AMBULATORY_CARE_PROVIDER_SITE_OTHER): Payer: Medicare Other

## 2019-05-18 VITALS — BP 140/80 | HR 61 | Temp 98.2°F | Ht 62.0 in | Wt 117.0 lb

## 2019-05-18 DIAGNOSIS — I10 Essential (primary) hypertension: Secondary | ICD-10-CM

## 2019-05-18 DIAGNOSIS — I700 Atherosclerosis of aorta: Secondary | ICD-10-CM | POA: Diagnosis not present

## 2019-05-18 DIAGNOSIS — E785 Hyperlipidemia, unspecified: Secondary | ICD-10-CM | POA: Diagnosis not present

## 2019-05-18 DIAGNOSIS — Z23 Encounter for immunization: Secondary | ICD-10-CM

## 2019-05-18 DIAGNOSIS — E876 Hypokalemia: Secondary | ICD-10-CM | POA: Diagnosis not present

## 2019-05-18 DIAGNOSIS — E119 Type 2 diabetes mellitus without complications: Secondary | ICD-10-CM | POA: Diagnosis not present

## 2019-05-18 DIAGNOSIS — F3342 Major depressive disorder, recurrent, in full remission: Secondary | ICD-10-CM | POA: Diagnosis not present

## 2019-05-18 DIAGNOSIS — M722 Plantar fascial fibromatosis: Secondary | ICD-10-CM | POA: Insufficient documentation

## 2019-05-18 LAB — LIPID PANEL
Cholesterol: 205 mg/dL — ABNORMAL HIGH (ref 0–200)
HDL: 54.3 mg/dL (ref >39.00)
LDL Cholesterol: 125 mg/dL — ABNORMAL HIGH (ref 0–99)
NonHDL: 150.81
Total CHOL/HDL Ratio: 4
Triglycerides: 127 mg/dL (ref 0.0–149.0)
VLDL: 25.4 mg/dL (ref 0.0–40.0)

## 2019-05-18 LAB — HEPATIC FUNCTION PANEL
ALT: 10 U/L (ref 0–35)
AST: 22 U/L (ref 0–37)
Albumin: 4.1 g/dL (ref 3.5–5.2)
Alkaline Phosphatase: 47 U/L (ref 39–117)
Bilirubin, Direct: 0 mg/dL (ref 0.0–0.3)
Total Bilirubin: 0.5 mg/dL (ref 0.2–1.2)
Total Protein: 6.9 g/dL (ref 6.0–8.3)

## 2019-05-18 NOTE — Assessment & Plan Note (Signed)
Labs

## 2019-05-18 NOTE — Assessment & Plan Note (Signed)
leg pain - resolved off statin, resumed w/statin

## 2019-05-18 NOTE — Patient Instructions (Signed)
Plantar Fasciitis  Plantar fasciitis is a painful foot condition that affects the heel. It occurs when the band of tissue that connects the toes to the heel bone (plantar fascia) becomes irritated. This can happen as the result of exercising too much or doing other repetitive activities (overuse injury). The pain from plantar fasciitis can range from mild irritation to severe pain that makes it difficult to walk or move. The pain is usually worse in the morning after sleeping, or after sitting or lying down for a while. Pain may also be worse after long periods of walking or standing. What are the causes? This condition may be caused by:  Standing for long periods of time.  Wearing shoes that do not have good arch support.  Doing activities that put stress on joints (high-impact activities), including running, aerobics, and ballet.  Being overweight.  An abnormal way of walking (gait).  Tight muscles in the back of your lower leg (calf).  High arches in your feet.  Starting a new athletic activity. What are the signs or symptoms? The main symptom of this condition is heel pain. Pain may:  Be worse with first steps after a time of rest, especially in the morning after sleeping or after you have been sitting or lying down for a while.  Be worse after long periods of standing still.  Decrease after 30-45 minutes of activity, such as gentle walking. How is this diagnosed? This condition may be diagnosed based on your medical history and your symptoms. Your health care provider may ask questions about your activity level. Your health care provider will do a physical exam to check for:  A tender area on the bottom of your foot.  A high arch in your foot.  Pain when you move your foot.  Difficulty moving your foot. You may have imaging tests to confirm the diagnosis, such as:  X-rays.  Ultrasound.  MRI. How is this treated? Treatment for plantar fasciitis depends on how  severe your condition is. Treatment may include:  Rest, ice, applying pressure (compression), and raising the affected foot (elevation). This may be called RICE therapy. Your health care provider may recommend RICE therapy along with over-the-counter pain medicines to manage your pain.  Exercises to stretch your calves and your plantar fascia.  A splint that holds your foot in a stretched, upward position while you sleep (night splint).  Physical therapy to relieve symptoms and prevent problems in the future.  Injections of steroid medicine (cortisone) to relieve pain and inflammation.  Stimulating your plantar fascia with electrical impulses (extracorporeal shock wave therapy). This is usually the last treatment option before surgery.  Surgery, if other treatments have not worked after 12 months. Follow these instructions at home:  Managing pain, stiffness, and swelling  If directed, put ice on the painful area: ? Put ice in a plastic bag, or use a frozen bottle of water. ? Place a towel between your skin and the bag or bottle. ? Roll the bottom of your foot over the bag or bottle. ? Do this for 20 minutes, 2-3 times a day.  Wear athletic shoes that have air-sole or gel-sole cushions, or try wearing soft shoe inserts that are designed for plantar fasciitis.  Raise (elevate) your foot above the level of your heart while you are sitting or lying down. Activity  Avoid activities that cause pain. Ask your health care provider what activities are safe for you.  Do physical therapy exercises and stretches as told   by your health care provider.  Try activities and forms of exercise that are easier on your joints (low-impact). Examples include swimming, water aerobics, and biking. General instructions  Take over-the-counter and prescription medicines only as told by your health care provider.  Wear a night splint while sleeping, if told by your health care provider. Loosen the splint  if your toes tingle, become numb, or turn cold and blue.  Maintain a healthy weight, or work with your health care provider to lose weight as needed.  Keep all follow-up visits as told by your health care provider. This is important. Contact a health care provider if you:  Have symptoms that do not go away after caring for yourself at home.  Have pain that gets worse.  Have pain that affects your ability to move or do your daily activities. Summary  Plantar fasciitis is a painful foot condition that affects the heel. It occurs when the band of tissue that connects the toes to the heel bone (plantar fascia) becomes irritated.  The main symptom of this condition is heel pain that may be worse after exercising too much or standing still for a long time.  Treatment varies, but it usually starts with rest, ice, compression, and elevation (RICE therapy) and over-the-counter medicines to manage pain. This information is not intended to replace advice given to you by your health care provider. Make sure you discuss any questions you have with your health care provider. Document Released: 05/26/2001 Document Revised: 08/13/2017 Document Reviewed: 06/28/2017 Elsevier Patient Education  2020 [employer]  

## 2019-05-18 NOTE — Assessment & Plan Note (Signed)
Atenolol

## 2019-05-18 NOTE — Assessment & Plan Note (Signed)
Paxil 

## 2019-05-18 NOTE — Progress Notes (Signed)
Subjective:  Patient ID: Murlean Caller, female    DOB: 02/02/1932  Age: 85 y.o. MRN: [id number]  CC: No chief complaint on file.   HPI INTISAR LANDIN presents for leg pain - resolved off statin, resumed w/statin; HTN, CVA f/u  Outpatient Medications Prior to Visit  Medication Sig Dispense Refill   amLODipine (NORVASC) 5 MG tablet Take 1 tablet (5 mg total) by mouth daily. 90 tablet 3   atenolol (TENORMIN) 100 MG tablet Take 0.5 tablets (50 mg total) by mouth 2 (two) times daily. 90 tablet 3   atenolol (TENORMIN) 100 MG tablet TAKE (1/2) TABLET TWICE DAILY. 90 tablet 3   Cholecalciferol (VITAMIN D3) 50 MCG (2000 UT) capsule Take 1 capsule (2,000 Units total) by mouth daily. 100 capsule 3   levETIRAcetam (KEPPRA) 750 MG tablet Take 1 tablet (750 mg total) by mouth 2 (two) times daily. 180 tablet 3   Multiple Vitamins-Minerals (CENTRUM SILVER) CHEW Chew 1 each by mouth 3 (three) times a week.      PARoxetine (PAXIL) 10 MG tablet TAKE 1 TABLET IN THE MORNING. 90 tablet 3   rosuvastatin (CRESTOR) 20 MG tablet 0.5 tablet M-W-F 45 tablet 3   No facility-administered medications prior to visit.     ROS: Review of Systems  Constitutional: Positive for fatigue. Negative for activity change, appetite change, chills and unexpected weight change.  HENT: Negative for congestion, mouth sores and sinus pressure.   Eyes: Negative for visual disturbance.  Respiratory: Negative for cough and chest tightness.   Gastrointestinal: Negative for abdominal pain and nausea.  Genitourinary: Negative for difficulty urinating, frequency and vaginal pain.  Musculoskeletal: Positive for arthralgias. Negative for back pain and gait problem.  Skin: Negative for pallor and rash.  Neurological: Negative for dizziness, tremors, weakness, numbness and headaches.  Psychiatric/Behavioral: Positive for dysphoric mood. Negative for confusion, sleep disturbance and suicidal ideas.    Objective:  BP 140/80 (BP  Location: Left Arm, Patient Position: Sitting, Cuff Size: Normal)    Pulse 61    Temp 98.2 F (36.8 C) (Oral)    Ht 5\' 2"  (1.575 m)    Wt 117 lb (53.1 kg)    SpO2 96%    BMI 21.40 kg/m   BP Readings from Last 3 Encounters:  05/18/19 140/80  03/27/19 (!) 155/80  02/22/19 (!) 142/84    Wt Readings from Last 3 Encounters:  05/18/19 117 lb (53.1 kg)  03/27/19 119 lb (54 kg)  02/22/19 115 lb (52.2 kg)    Physical Exam Constitutional:      General: She is not in acute distress.    Appearance: She is well-developed.  HENT:     Head: Normocephalic.     Right Ear: External ear normal.     Left Ear: External ear normal.     Nose: Nose normal.  Eyes:     General:        Right eye: No discharge.        Left eye: No discharge.     Conjunctiva/sclera: Conjunctivae normal.     Pupils: Pupils are equal, round, and reactive to light.  Neck:     Musculoskeletal: Normal range of motion and neck supple.     Thyroid: No thyromegaly.     Vascular: No JVD.     Trachea: No tracheal deviation.  Cardiovascular:     Rate and Rhythm: Normal rate and regular rhythm.     Heart sounds: Normal heart sounds.  Pulmonary:  Effort: No respiratory distress.     Breath sounds: No stridor. No wheezing.  Abdominal:     General: Bowel sounds are normal. There is no distension.     Palpations: Abdomen is soft. There is no mass.     Tenderness: There is no abdominal tenderness. There is no guarding or rebound.  Musculoskeletal:        General: No tenderness.  Lymphadenopathy:     Cervical: No cervical adenopathy.  Skin:    Findings: No erythema or rash.  Neurological:     Mental Status: She is oriented to person, place, and time.     Cranial Nerves: No cranial nerve deficit.     Motor: No abnormal muscle tone.     Coordination: Coordination normal.     Deep Tendon Reflexes: Reflexes normal.  Psychiatric:        Behavior: Behavior normal.        Thought Content: Thought content normal.         Judgment: Judgment normal.    R heel - w/pain Lab Results  Component Value Date   WBC 7.8 02/22/2019   HGB 12.5 02/22/2019   HCT 38.0 02/22/2019   PLT 324.0 02/22/2019   GLUCOSE 104 (H) 02/22/2019   CHOL 180 10/07/2018   TRIG 99.0 10/07/2018   HDL 62.70 10/07/2018   LDLDIRECT 125.6 01/16/2013   LDLCALC 98 10/07/2018   ALT 12 02/22/2019   AST 22 02/22/2019   NA 137 02/22/2019   K 4.2 02/22/2019   CL 102 02/22/2019   CREATININE 1.02 02/22/2019   BUN 14 02/22/2019   CO2 27 02/22/2019   TSH 1.93 02/22/2019   INR 0.92 04/22/2018   HGBA1C 6.3 02/22/2019   MICROALBUR 2.8 (H) 09/17/2016    Ct Angio Head W Or Wo Contrast  Result Date: 04/23/2018 CLINICAL DATA:  Subarachnoid hemorrhage, proven, follow-up. EXAM: CT ANGIOGRAPHY HEAD AND NECK TECHNIQUE: Multidetector CT imaging of the head and neck was performed using the standard protocol during bolus administration of intravenous contrast. Multiplanar CT image reconstructions and MIPs were obtained to evaluate the vascular anatomy. Carotid stenosis measurements (when applicable) are obtained utilizing NASCET criteria, using the distal internal carotid diameter as the denominator. CONTRAST:  50mL ISOVUE-370 IOPAMIDOL (ISOVUE-370) INJECTION 76% COMPARISON:  The of conservatively or FINDINGS: CT HEAD FINDINGS Brain: Subarachnoid hemorrhage within the right middle frontal sulcus is stable. No new areas of hemorrhage are present. Atrophy and white matter disease is noted bilaterally. No mass lesion is present. No significant cortical infarct is present. Vascular: Atherosclerotic calcifications are present within the cavernous internal carotid arteries. There is no hyperdense vessel. Skull: Calvarium is intact. No focal lytic or blastic lesions are present. Asymmetric degenerative changes are present at the right TMJ. Sinuses: The paranasal sinuses and mastoid air cells are clear. Orbits: Left lens replacement is present. Globes and orbits are  otherwise within normal limits. Review of the MIP images confirms the above findings CTA NECK FINDINGS Aortic arch: A 3 vessel arch configuration is present. No significant stenosis or aneurysm is present. Right carotid system: The right common carotid artery is within normal limits. Dense atherosclerotic calcifications are present at the bifurcation without a significant stenosis relative to the more distal vessel. Left carotid system: The left common carotid artery is within normal limits. Atherosclerotic calcifications are present at the bifurcation without a significant luminal stenosis relative to the more distal vessel. Vertebral arteries: The vertebral arteries are codominant. Both vertebral arteries originate from the subclavian arteries  without a significant stenosis. There is some tortuosity of the V1 segments bilaterally. No significant stenosis or vessel injury is present to either vertebral artery in the neck. Skeleton: Degenerative anterolisthesis is present at C2-3, C3-4, and C4-5. There is chronic loss of disc height with endplate sclerosis at F6-2 and C6-7. Marked lucency surrounds the roots of the residual right maxillary molar. Dental caries are present without periapical disease. Other neck: The soft tissues the neck are otherwise unremarkable. No significant adenopathy is present. No mucosal lesions are evident. Salivary glands are within normal limits bilaterally. Thyroid is normal. Upper chest: Centrilobular emphysematous changes are present bilaterally. No focal nodule or mass lesion is present. Review of the MIP images confirms the above findings CTA HEAD FINDINGS Anterior circulation: Atherosclerotic calcifications are present within the cavernous internal carotid arteries bilaterally. There is no hyperdense vessel. 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 No definite posterior communicating artery is present. The A1 and M1 segments are normal. The  anterior communicating artery is patent. MCA bifurcations are within normal limits bilaterally. The ACA and MCA branch vessels are normal. No focal vascular lesion is evident at the location of the subarachnoid hemorrhage over the right frontal convexity. Posterior circulation: Vertebral arteries are within normal limits bilaterally. PICA origins are visualized and normal. The basilar artery is normal. Both posterior cerebral arteries originate from the basilar tip. PCA branch vessels are within normal limits bilaterally. Venous sinuses: The dural sinuses are patent. Straight sinus and deep cerebral veins are intact. Cortical veins are normal. Anatomic variants: None Delayed phase: Delayed images demonstrate no pathologic enhancement. White matter disease is accentuated. Review of the MIP images confirms the above findings IMPRESSION: 1. No acute or focal vascular lesion to explain subarachnoid hemorrhage over the convexity of the right frontal lobe. 2. No mass lesion. 3. Stable atrophy and white matter disease. 4. 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 Recommend follow-up CTA of the head in 1 year to assess stability. This aneurysm does not correspond with the area of subarachnoid hemorrhage. Electronically Signed   By: San Morelle M.D.   On: 04/23/2018 19:28   Dg Chest 2 View  Result Date: 04/22/2018 CLINICAL DATA:  Intermittent LEFT arm numbness for 2 weeks, history hypertension, diabetes mellitus, anxiety, former smoker EXAM: CHEST - 2 VIEW COMPARISON:  02/12/2018 FINDINGS: Normal heart size, mediastinal contours, and pulmonary vascularity. Atherosclerotic calcification aorta. Lungs hyperinflated with minimal subsegmental atelectasis at LEFT costophrenic angle. Remaining lungs clear. No acute infiltrate, pleural effusion or pneumothorax. Bones appear demineralized with old healed fracture of the posterolateral RIGHT sixth rib. IMPRESSION: Hyperinflated lungs  with minimal subsegmental atelectasis at LEFT costophrenic angle. Electronically Signed   By: Lavonia Dana M.D.   On: 04/22/2018 11:07   Ct Head Wo Contrast  Result Date: 04/22/2018 CLINICAL DATA:  Left arm numbness. EXAM: CT HEAD WITHOUT CONTRAST CT CERVICAL SPINE WITHOUT CONTRAST TECHNIQUE: Multidetector CT imaging of the head and cervical spine was performed following the standard protocol without intravenous contrast. Multiplanar CT image reconstructions of the cervical spine were also generated. COMPARISON:  None. FINDINGS: CT HEAD FINDINGS Brain: Mild diffuse cortical atrophy is noted. Mild chronic ischemic white matter disease is noted. No mass effect or midline shift is noted. Ventricular size is within normal limits. There is no evidence of mass lesion, hemorrhage or acute infarction. Vascular: No hyperdense vessel or unexpected calcification. Skull: Normal. Negative for fracture or focal lesion. Sinuses/Orbits: No  acute finding. Other: None. CT CERVICAL SPINE FINDINGS Alignment: Grade 1 anterolisthesis of C3-4 C4-5 is noted secondary to posterior facet joint hypertrophy. Skull base and vertebrae: No acute fracture. No primary bone lesion or focal pathologic process. Soft tissues and spinal canal: No prevertebral fluid or swelling. No visible canal hematoma. Disc levels: Severe degenerative disc disease is noted at C5-6 and C6-7 with anterior osteophyte formation. Mild degenerative disc disease is noted at C4-5 and C7-T1. Upper chest: Negative. Other: Degenerative changes seen involving posterior facet joints bilaterally, right greater than left. IMPRESSION: Mild diffuse cortical atrophy. Mild chronic ischemic white matter disease. No acute intracranial abnormality seen. Multilevel degenerative disc disease is noted in the cervical spine. No fracture or other acute abnormality is noted. Electronically Signed   By: Lupita Raider, M.D.   On: 04/22/2018 11:09   Ct Angio Neck W Or Wo Contrast  Result  Date: 04/23/2018 CLINICAL DATA:  Subarachnoid hemorrhage, proven, follow-up. EXAM: CT ANGIOGRAPHY HEAD AND NECK TECHNIQUE: Multidetector CT imaging of the head and neck was performed using the standard protocol during bolus administration of intravenous contrast. Multiplanar CT image reconstructions and MIPs were obtained to evaluate the vascular anatomy. Carotid stenosis measurements (when applicable) are obtained utilizing NASCET criteria, using the distal internal carotid diameter as the denominator. CONTRAST:  50mL ISOVUE-370 IOPAMIDOL (ISOVUE-370) INJECTION 76% COMPARISON:  The of conservatively or FINDINGS: CT HEAD FINDINGS Brain: Subarachnoid hemorrhage within the right middle frontal sulcus is stable. No new areas of hemorrhage are present. Atrophy and white matter disease is noted bilaterally. No mass lesion is present. No significant cortical infarct is present. Vascular: Atherosclerotic calcifications are present within the cavernous internal carotid arteries. There is no hyperdense vessel. Skull: Calvarium is intact. No focal lytic or blastic lesions are present. Asymmetric degenerative changes are present at the right TMJ. Sinuses: The paranasal sinuses and mastoid air cells are clear. Orbits: Left lens replacement is present. Globes and orbits are otherwise within normal limits. Review of the MIP images confirms the above findings CTA NECK FINDINGS Aortic arch: A 3 vessel arch configuration is present. No significant stenosis or aneurysm is present. Right carotid system: The right common carotid artery is within normal limits. Dense atherosclerotic calcifications are present at the bifurcation without a significant stenosis relative to the more distal vessel. Left carotid system: The left common carotid artery is within normal limits. Atherosclerotic calcifications are present at the bifurcation without a significant luminal stenosis relative to the more distal vessel. Vertebral arteries: The vertebral  arteries are codominant. Both vertebral arteries originate from the subclavian arteries without a significant stenosis. There is some tortuosity of the V1 segments bilaterally. No significant stenosis or vessel injury is present to either vertebral artery in the neck. Skeleton: Degenerative anterolisthesis is present at C2-3, C3-4, and C4-5. There is chronic loss of disc height with endplate sclerosis at C5-6 and C6-7. Marked lucency surrounds the roots of the residual right maxillary molar. Dental caries are present without periapical disease. Other neck: The soft tissues the neck are otherwise unremarkable. No significant adenopathy is present. No mucosal lesions are evident. Salivary glands are within normal limits bilaterally. Thyroid is normal. Upper chest: Centrilobular emphysematous changes are present bilaterally. No focal nodule or mass lesion is present. Review of the MIP images confirms the above findings CTA HEAD FINDINGS Anterior circulation: Atherosclerotic calcifications are present within the cavernous internal carotid arteries bilaterally. There is no hyperdense vessel. 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 No definite posterior communicating artery is present. The A1 and M1 segments are normal. The anterior communicating artery is patent. MCA bifurcations are within normal limits bilaterally. The ACA and MCA branch vessels are normal. No focal vascular lesion is evident at the location of the subarachnoid hemorrhage over the right frontal convexity. Posterior circulation: Vertebral arteries are within normal limits bilaterally. PICA origins are visualized and normal. The basilar artery is normal. Both posterior cerebral arteries originate from the basilar tip. PCA branch vessels are within normal limits bilaterally. Venous sinuses: The dural sinuses are patent. Straight sinus and deep cerebral veins are intact. Cortical veins are normal. Anatomic variants:  None Delayed phase: Delayed images demonstrate no pathologic enhancement. White matter disease is accentuated. Review of the MIP images confirms the above findings IMPRESSION: 1. No acute or focal vascular lesion to explain subarachnoid hemorrhage over the convexity of the right frontal lobe. 2. No mass lesion. 3. Stable atrophy and white matter disease. 4. 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 Recommend follow-up CTA of the head in 1 year to assess stability. This aneurysm does not correspond with the area of subarachnoid hemorrhage. Electronically Signed   By: Marin Roberts M.D.   On: 04/23/2018 19:28   Ct Cervical Spine Wo Contrast  Result Date: 04/22/2018 CLINICAL DATA:  Left arm numbness. EXAM: CT HEAD WITHOUT CONTRAST CT CERVICAL SPINE WITHOUT CONTRAST TECHNIQUE: Multidetector CT imaging of the head and cervical spine was performed following the standard protocol without intravenous contrast. Multiplanar CT image reconstructions of the cervical spine were also generated. COMPARISON:  None. FINDINGS: CT HEAD FINDINGS Brain: Mild diffuse cortical atrophy is noted. Mild chronic ischemic white matter disease is noted. No mass effect or midline shift is noted. Ventricular size is within normal limits. There is no evidence of mass lesion, hemorrhage or acute infarction. Vascular: No hyperdense vessel or unexpected calcification. Skull: Normal. Negative for fracture or focal lesion. Sinuses/Orbits: No acute finding. Other: None. CT CERVICAL SPINE FINDINGS Alignment: Grade 1 anterolisthesis of C3-4 C4-5 is noted secondary to posterior facet joint hypertrophy. Skull base and vertebrae: No acute fracture. No primary bone lesion or focal pathologic process. Soft tissues and spinal canal: No prevertebral fluid or swelling. No visible canal hematoma. Disc levels: Severe degenerative disc disease is noted at C5-6 and C6-7 with anterior osteophyte formation. Mild  degenerative disc disease is noted at C4-5 and C7-T1. Upper chest: Negative. Other: Degenerative changes seen involving posterior facet joints bilaterally, right greater than left. IMPRESSION: Mild diffuse cortical atrophy. Mild chronic ischemic white matter disease. No acute intracranial abnormality seen. Multilevel degenerative disc disease is noted in the cervical spine. No fracture or other acute abnormality is noted. Electronically Signed   By: Lupita Raider, M.D.   On: 04/22/2018 11:09   Mr Brain Wo Contrast (neuro Protocol)  Result Date: 04/22/2018 CLINICAL DATA:  Numbness or tingling, paresthesia. Intermittent left arm numbness for 2 weeks EXAM: MRI HEAD WITHOUT CONTRAST TECHNIQUE: Multiplanar, multiecho pulse sequences of the brain and surrounding structures were obtained without intravenous contrast. COMPARISON:  CT from earlier today FINDINGS: Brain: Small focus of subarachnoid FLAIR hyperintensity and gradient hypointensity along the precentral gyrus on the right, this area is high-density by CT. No other site of hemorrhage seen, either acute or chronic. There is mild for age chronic small vessel ischemia in the cerebral white matter. Mild cerebral volume loss. Clustered appearance of gyri at the left vertex, nonspecific and likely noncontributory. No  underlying mass is seen and there is no extrinsic mass effect by collection. No hydrocephalus. Vascular: Major flow voids are preserved Skull and upper cervical spine: No evidence of marrow lesion. Diffuse cervical facet spurring with mild C3-4 and C4-5 anterolisthesis. Sinuses/Orbits: Left cataract resection. Other: These results were called by telephone at the time of interpretation on 04/22/2018 at 2:10 pm to Dr. Samuel JesterKATHLEEN MCMANUS , who verbally acknowledged these results. IMPRESSION: 1. Small volume subarachnoid hemorrhage along the high right frontal convexity. Trauma, coagulopathy, or vasculopathy/amyloid are primary considerations in this location.  There is normal superior sagittal sinus flow voids. No acute or remote hemorrhage seen elsewhere. 2. Mild atrophy and chronic small vessel ischemia for age. Electronically Signed   By: Marnee SpringJonathon  Watts M.D.   On: 04/22/2018 14:14    Assessment & Plan:   Diagnoses and all orders for this visit:  Need for influenza vaccination -     Flu Vaccine QUAD High Dose(Fluad)     No orders of the defined types were placed in this encounter.    Follow-up: No follow-ups on file.  Sonda PrimesAlex Ixchel Duck, MD

## 2019-05-18 NOTE — Assessment & Plan Note (Signed)
Ice

## 2019-05-28 IMAGING — CT CT CERVICAL SPINE W/O CM
4 of 7 series · 14 of 33 positions shown, 15 images · non-contrast
Comparison: None.

CLINICAL DATA: Left arm numbness.

EXAM:
CT HEAD WITHOUT CONTRAST
CT CERVICAL SPINE WITHOUT CONTRAST
TECHNIQUE: Multidetector CT imaging of the head and cervical spine was
performed following the standard protocol without intravenous
contrast. Multiplanar CT image reconstructions of the cervical spine
were also generated.

[Series 5: coronal soft tissue · coronal · 0.30mm/px · 3 of 75 slices shown]
[im 19/75  bone]
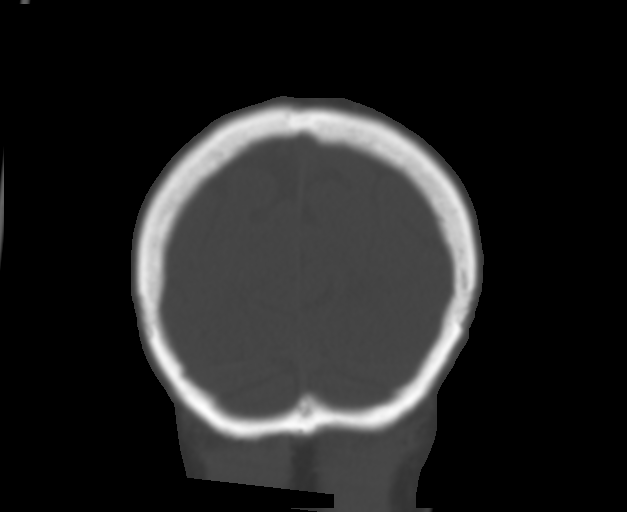
[im 38/75  bone]
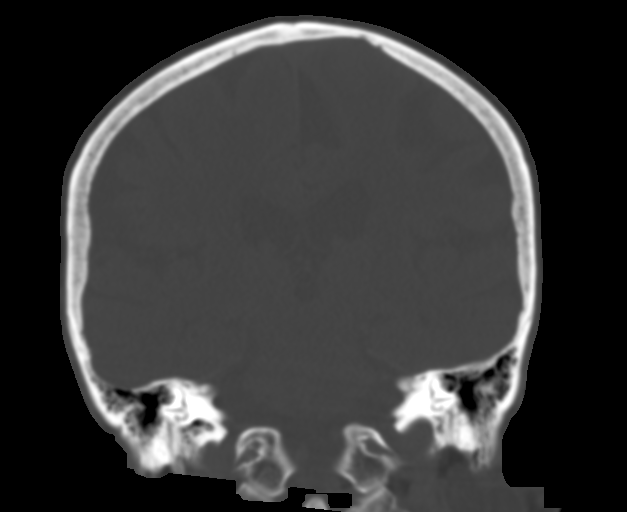
[im 56/75  bone]
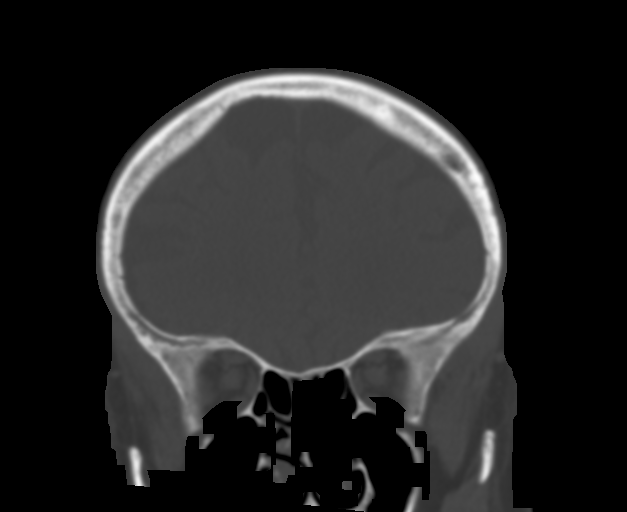

[Series 9: c spine soft · axial · 0.30mm/px · z∈[-248,-178]mm · 3 of 71 slices shown]
[im 18/71  soft-tissue]
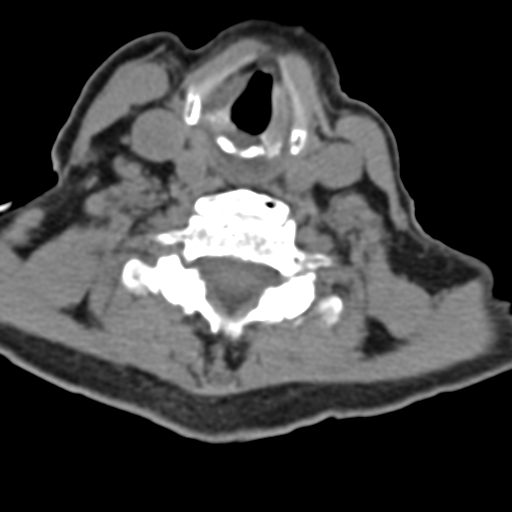
[im 36/71  soft-tissue]
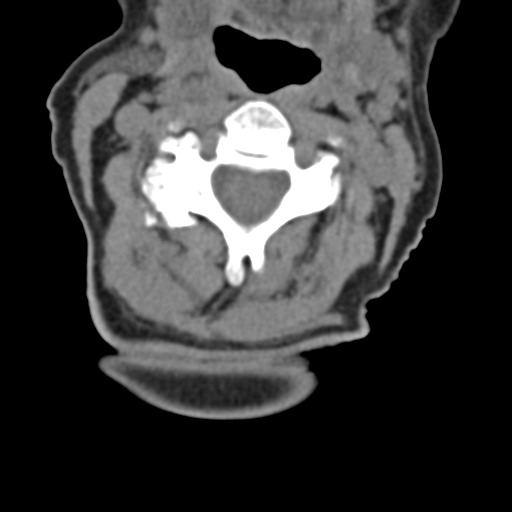
[im 53/71  soft-tissue]
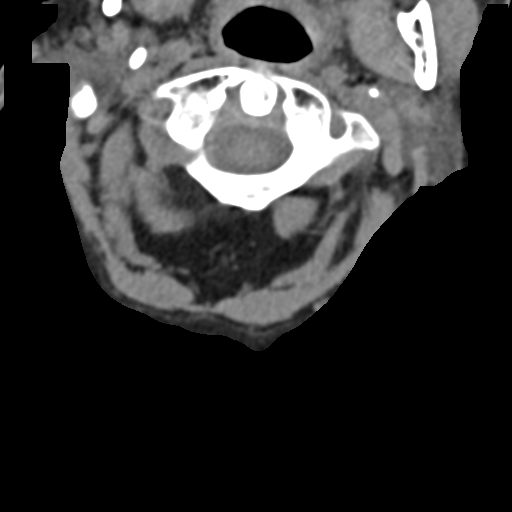

[Series 11: orthogonal bone · axial · 0.23mm/px · z∈[-298,-208]mm · 4 of 87 slices shown, 5 images]
[im 18/87  soft-tissue]
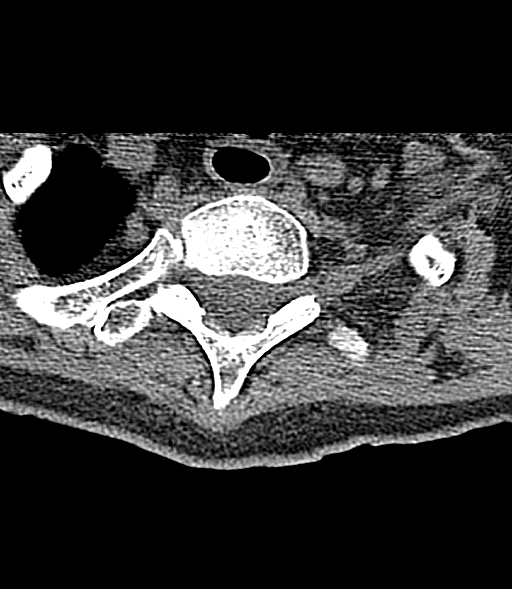
[im 18/87  bone]
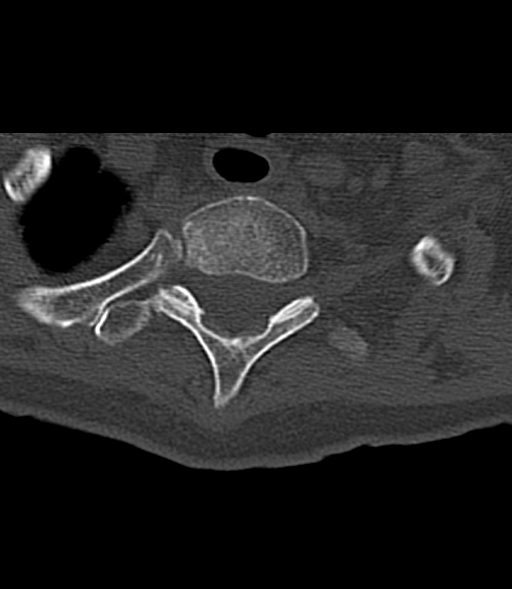
[im 35/87  bone]
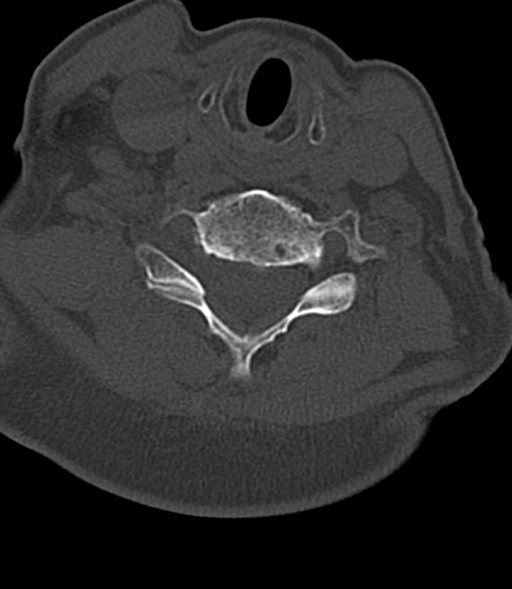
[im 52/87  bone]
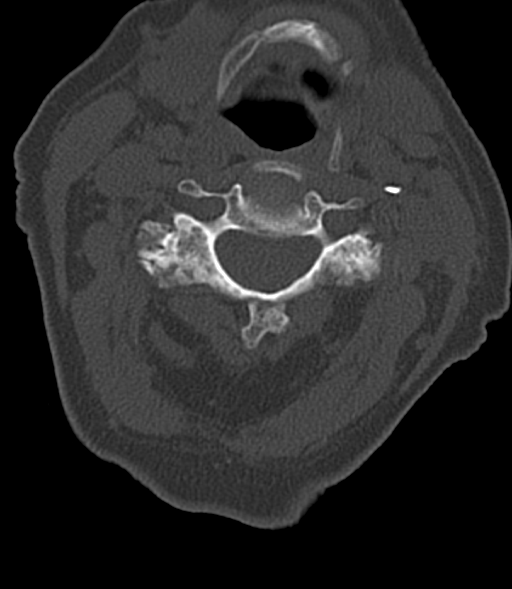
[im 69/87  bone]
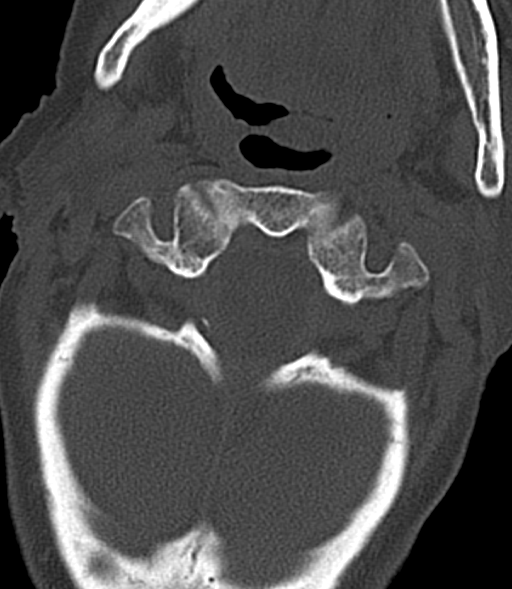

[Series 13: sagittal bone · sagittal · 0.27mm/px · 4 of 61 slices shown]
[im 13/61  bone]
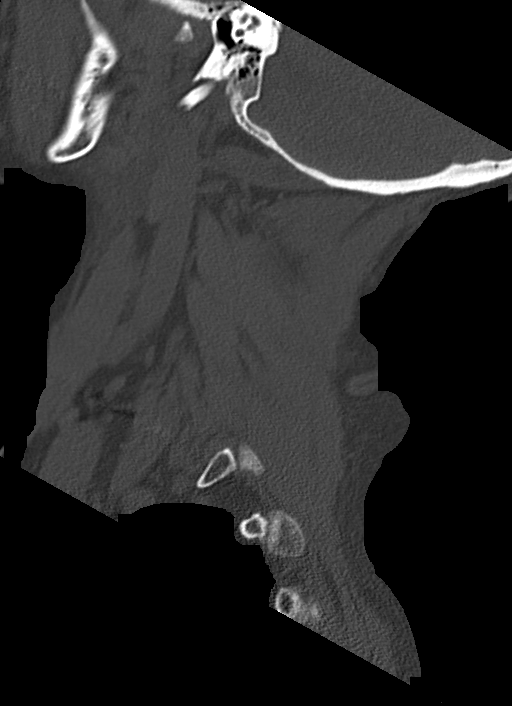
[im 25/61  bone]
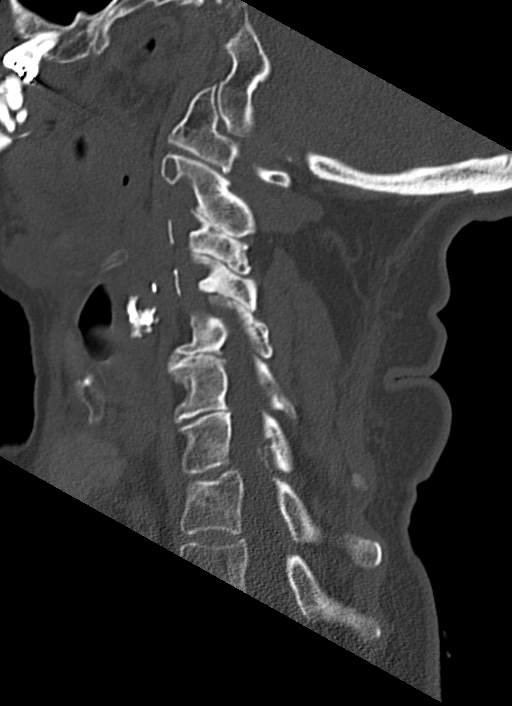
[im 37/61  bone]
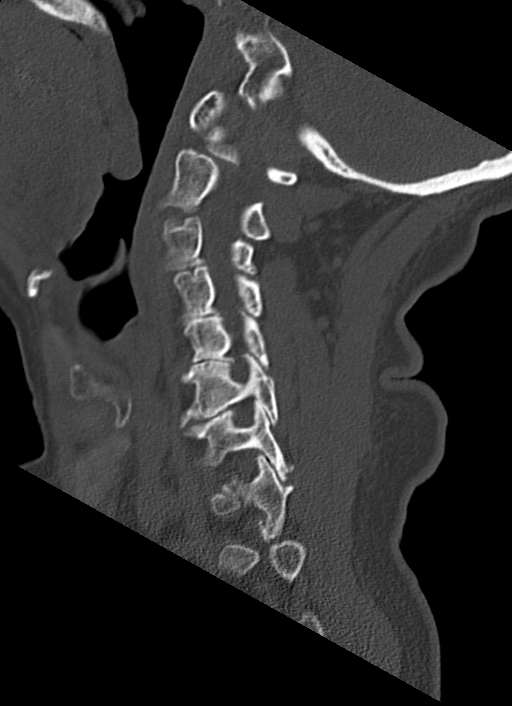
[im 49/61  bone]
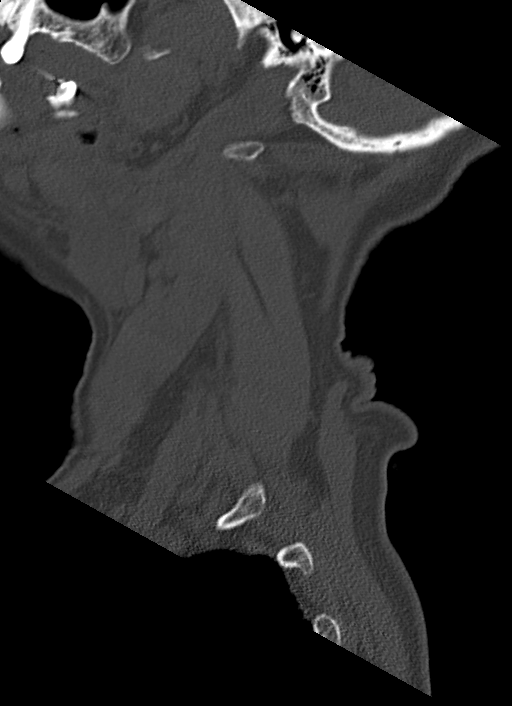

[14 of 33 positions shown; findings below may reference images not displayed]

FINDINGS: CT HEAD FINDINGS

Brain: Mild diffuse cortical atrophy is noted. Mild chronic ischemic
white matter disease is noted. No mass effect or midline shift is
noted. Ventricular size is within normal limits. There is no
evidence of mass lesion, hemorrhage or acute infarction.

Vascular: No hyperdense vessel or unexpected calcification.

Skull: Normal. Negative for fracture or focal lesion.

Sinuses/Orbits: No acute finding.

Other: None.

CT CERVICAL SPINE FINDINGS

Alignment: Grade 1 anterolisthesis of C3-4 C4-5 is noted secondary
to posterior facet joint hypertrophy.

Skull base and vertebrae: No acute fracture. No primary bone lesion
or focal pathologic process.

Soft tissues and spinal canal: No prevertebral fluid or swelling. No
visible canal hematoma.

Disc levels: Severe degenerative disc disease is noted at C5-6 and
C6-7 with anterior osteophyte formation. Mild degenerative disc
disease is noted at C4-5 and C7-T1.

Upper chest: Negative.

Other: Degenerative changes seen involving posterior facet joints
bilaterally, right greater than left.
IMPRESSION: Mild diffuse cortical atrophy. Mild chronic ischemic white matter
disease. No acute intracranial abnormality seen.

Multilevel degenerative disc disease is noted in the cervical spine.
No fracture or other acute abnormality is noted.

## 2019-06-28 ENCOUNTER — Ambulatory Visit: Payer: Medicare Other | Admitting: Internal Medicine

## 2019-07-20 ENCOUNTER — Other Ambulatory Visit: Payer: Self-pay | Admitting: Internal Medicine

## 2019-08-02 DIAGNOSIS — N760 Acute vaginitis: Secondary | ICD-10-CM | POA: Diagnosis not present

## 2019-08-17 ENCOUNTER — Encounter: Payer: Self-pay | Admitting: Internal Medicine

## 2019-08-17 ENCOUNTER — Other Ambulatory Visit: Payer: Self-pay

## 2019-08-17 ENCOUNTER — Ambulatory Visit (INDEPENDENT_AMBULATORY_CARE_PROVIDER_SITE_OTHER): Payer: Medicare Other | Admitting: Internal Medicine

## 2019-08-17 DIAGNOSIS — F3342 Major depressive disorder, recurrent, in full remission: Secondary | ICD-10-CM

## 2019-08-17 DIAGNOSIS — E785 Hyperlipidemia, unspecified: Secondary | ICD-10-CM | POA: Diagnosis not present

## 2019-08-17 DIAGNOSIS — E119 Type 2 diabetes mellitus without complications: Secondary | ICD-10-CM

## 2019-08-17 DIAGNOSIS — I10 Essential (primary) hypertension: Secondary | ICD-10-CM

## 2019-08-17 DIAGNOSIS — R569 Unspecified convulsions: Secondary | ICD-10-CM

## 2019-08-17 NOTE — Progress Notes (Signed)
Subjective:  Patient ID: Vickie Wilson, female    DOB: 02/02/1932  Age: 85 y.o. MRN: [id number]  CC: No chief complaint on file.   HPI Vickie Wilson presents for HTN, depression, seizures f/u C/o foot pain - heel  Outpatient Medications Prior to Visit  Medication Sig Dispense Refill   amLODipine (NORVASC) 5 MG tablet TAKE 1 TABLET ONCE DAILY. 90 tablet 3   atenolol (TENORMIN) 100 MG tablet Take 0.5 tablets (50 mg total) by mouth 2 (two) times daily. 90 tablet 3   atenolol (TENORMIN) 100 MG tablet TAKE (1/2) TABLET TWICE DAILY. 90 tablet 3   Cholecalciferol (VITAMIN D3) 50 MCG (2000 UT) capsule Take 1 capsule (2,000 Units total) by mouth daily. 100 capsule 3   levETIRAcetam (KEPPRA) 750 MG tablet Take 1 tablet (750 mg total) by mouth 2 (two) times daily. 180 tablet 3   Multiple Vitamins-Minerals (CENTRUM SILVER) CHEW Chew 1 each by mouth 3 (three) times a week.      PARoxetine (PAXIL) 10 MG tablet TAKE 1 TABLET IN THE MORNING. 90 tablet 3   rosuvastatin (CRESTOR) 20 MG tablet 0.5 tablet M-W-F 45 tablet 3   No facility-administered medications prior to visit.     ROS: Review of Systems  Constitutional: Negative for activity change, appetite change, chills, fatigue and unexpected weight change.  HENT: Negative for congestion, mouth sores and sinus pressure.   Eyes: Negative for visual disturbance.  Respiratory: Negative for cough and chest tightness.   Gastrointestinal: Negative for abdominal pain and nausea.  Genitourinary: Negative for difficulty urinating, frequency and vaginal pain.  Musculoskeletal: Positive for arthralgias. Negative for back pain and gait problem.  Skin: Negative for pallor and rash.  Neurological: Negative for dizziness, tremors, weakness, numbness and headaches.  Psychiatric/Behavioral: Negative for confusion and sleep disturbance.    Objective:  BP 132/84 (BP Location: Left Arm, Patient Position: Sitting, Cuff Size: Normal)    Pulse (!) 56     Temp 98.1 F (36.7 C) (Oral)    Ht  (1.575 m)    Wt 115 lb (52.2 kg)    SpO2 96%    BMI 21.03 kg/m   BP Readings from Last 3 Encounters:  08/17/19 132/84  05/18/19 140/80  03/27/19 (!) 155/80    Wt Readings from Last 3 Encounters:  08/17/19 115 lb (52.2 kg)  05/18/19 117 lb (53.1 kg)  03/27/19 119 lb (54 kg)    Physical Exam Constitutional:      General: She is not in acute distress.    Appearance: She is well-developed.  HENT:     Head: Normocephalic.     Right Ear: External ear normal.     Left Ear: External ear normal.     Nose: Nose normal.  Eyes:     General:        Right eye: No discharge.        Left eye: No discharge.     Conjunctiva/sclera: Conjunctivae normal.     Pupils: Pupils are equal, round, and reactive to light.  Neck:     Musculoskeletal: Normal range of motion and neck supple.     Thyroid: No thyromegaly.     Vascular: No JVD.     Trachea: No tracheal deviation.  Cardiovascular:     Rate and Rhythm: Normal rate and regular rhythm.     Heart sounds: Normal heart sounds.  Pulmonary:     Effort: No respiratory distress.     Breath sounds: No stridor.  No wheezing.  Abdominal:     General: Bowel sounds are normal. There is no distension.     Palpations: Abdomen is soft. There is no mass.     Tenderness: There is no abdominal tenderness. There is no guarding or rebound.  Musculoskeletal:        General: No tenderness.  Lymphadenopathy:     Cervical: No cervical adenopathy.  Skin:    Findings: No erythema or rash.  Neurological:     Cranial Nerves: No cranial nerve deficit.     Motor: No abnormal muscle tone.     Coordination: Coordination normal.     Deep Tendon Reflexes: Reflexes normal.  Psychiatric:        Behavior: Behavior normal.        Thought Content: Thought content normal.        Judgment: Judgment normal.     Lab Results  Component Value Date   WBC 7.8 02/22/2019   HGB 12.5 02/22/2019   HCT 38.0 02/22/2019   PLT 324.0  02/22/2019   GLUCOSE 104 (H) 02/22/2019   CHOL 205 (H) 05/18/2019   TRIG 127.0 05/18/2019   HDL 54.30 05/18/2019   LDLDIRECT 125.6 01/16/2013   LDLCALC 125 (H) 05/18/2019   ALT 10 05/18/2019   AST 22 05/18/2019   NA 137 02/22/2019   K 4.2 02/22/2019   CL 102 02/22/2019   CREATININE 1.02 02/22/2019   BUN 14 02/22/2019   CO2 27 02/22/2019   TSH 1.93 02/22/2019   INR 0.92 04/22/2018   HGBA1C 6.3 02/22/2019   MICROALBUR 2.8 (H) 09/17/2016    Ct Angio Head W Or Wo Contrast  Result Date: 04/23/2018 CLINICAL DATA:  Subarachnoid hemorrhage, proven, follow-up. EXAM: CT ANGIOGRAPHY HEAD AND NECK TECHNIQUE: Multidetector CT imaging of the head and neck was performed using the standard protocol during bolus administration of intravenous contrast. Multiplanar CT image reconstructions and MIPs were obtained to evaluate the vascular anatomy. Carotid stenosis measurements (when applicable) are obtained utilizing NASCET criteria, using the distal internal carotid diameter as the denominator. CONTRAST:  47mL ISOVUE-370 IOPAMIDOL (ISOVUE-370) INJECTION 76% COMPARISON:  The of conservatively or FINDINGS: CT HEAD FINDINGS Brain: Subarachnoid hemorrhage within the right middle frontal sulcus is stable. No new areas of hemorrhage are present. Atrophy and white matter disease is noted bilaterally. No mass lesion is present. No significant cortical infarct is present. Vascular: Atherosclerotic calcifications are present within the cavernous internal carotid arteries. There is no hyperdense vessel. Skull: Calvarium is intact. No focal lytic or blastic lesions are present. Asymmetric degenerative changes are present at the right TMJ. Sinuses: The paranasal sinuses and mastoid air cells are clear. Orbits: Left lens replacement is present. Globes and orbits are otherwise within normal limits. Review of the MIP images confirms the above findings CTA NECK FINDINGS Aortic arch: A 3 vessel arch configuration is present. No  significant stenosis or aneurysm is present. Right carotid system: The right common carotid artery is within normal limits. Dense atherosclerotic calcifications are present at the bifurcation without a significant stenosis relative to the more distal vessel. Left carotid system: The left common carotid artery is within normal limits. Atherosclerotic calcifications are present at the bifurcation without a significant luminal stenosis relative to the more distal vessel. Vertebral arteries: The vertebral arteries are codominant. Both vertebral arteries originate from the subclavian arteries without a significant stenosis. There is some tortuosity of the V1 segments bilaterally. No significant stenosis or vessel injury is present to either vertebral artery in the  neck. Skeleton: Degenerative anterolisthesis is present at C2-3, C3-4, and C4-5. There is chronic loss of disc height with endplate sclerosis at C5-6 and C6-7. Marked lucency surrounds the roots of the residual right maxillary molar. Dental caries are present without periapical disease. Other neck: The soft tissues the neck are otherwise unremarkable. No significant adenopathy is present. No mucosal lesions are evident. Salivary glands are within normal limits bilaterally. Thyroid is normal. Upper chest: Centrilobular emphysematous changes are present bilaterally. No focal nodule or mass lesion is present. Review of the MIP images confirms the above findings CTA HEAD FINDINGS Anterior circulation: Atherosclerotic calcifications are present within the cavernous internal carotid arteries bilaterally. There is no hyperdense vessel. 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 No definite posterior communicating artery is present. The A1 and M1 segments are normal. The anterior communicating artery is patent. MCA bifurcations are within normal limits bilaterally. The ACA and MCA branch vessels are normal. No focal vascular lesion  is evident at the location of the subarachnoid hemorrhage over the right frontal convexity. Posterior circulation: Vertebral arteries are within normal limits bilaterally. PICA origins are visualized and normal. The basilar artery is normal. Both posterior cerebral arteries originate from the basilar tip. PCA branch vessels are within normal limits bilaterally. Venous sinuses: The dural sinuses are patent. Straight sinus and deep cerebral veins are intact. Cortical veins are normal. Anatomic variants: None Delayed phase: Delayed images demonstrate no pathologic enhancement. White matter disease is accentuated. Review of the MIP images confirms the above findings IMPRESSION: 1. No acute or focal vascular lesion to explain subarachnoid hemorrhage over the convexity of the right frontal lobe. 2. No mass lesion. 3. Stable atrophy and white matter disease. 4. 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 Recommend follow-up CTA of the head in 1 year to assess stability. This aneurysm does not correspond with the area of subarachnoid hemorrhage. Electronically Signed   By: Marin Roberts M.D.   On: 04/23/2018 19:28   Dg Chest 2 View  Result Date: 04/22/2018 CLINICAL DATA:  Intermittent LEFT arm numbness for 2 weeks, history hypertension, diabetes mellitus, anxiety, former smoker EXAM: CHEST - 2 VIEW COMPARISON:  02/12/2018 FINDINGS: Normal heart size, mediastinal contours, and pulmonary vascularity. Atherosclerotic calcification aorta. Lungs hyperinflated with minimal subsegmental atelectasis at LEFT costophrenic angle. Remaining lungs clear. No acute infiltrate, pleural effusion or pneumothorax. Bones appear demineralized with old healed fracture of the posterolateral RIGHT sixth rib. IMPRESSION: Hyperinflated lungs with minimal subsegmental atelectasis at LEFT costophrenic angle. Electronically Signed   By: Ulyses Southward M.D.   On: 04/22/2018 11:07   Ct Head Wo  Contrast  Result Date: 04/22/2018 CLINICAL DATA:  Left arm numbness. EXAM: CT HEAD WITHOUT CONTRAST CT CERVICAL SPINE WITHOUT CONTRAST TECHNIQUE: Multidetector CT imaging of the head and cervical spine was performed following the standard protocol without intravenous contrast. Multiplanar CT image reconstructions of the cervical spine were also generated. COMPARISON:  None. FINDINGS: CT HEAD FINDINGS Brain: Mild diffuse cortical atrophy is noted. Mild chronic ischemic white matter disease is noted. No mass effect or midline shift is noted. Ventricular size is within normal limits. There is no evidence of mass lesion, hemorrhage or acute infarction. Vascular: No hyperdense vessel or unexpected calcification. Skull: Normal. Negative for fracture or focal lesion. Sinuses/Orbits: No acute finding. Other: None. CT CERVICAL SPINE FINDINGS Alignment: Grade 1 anterolisthesis of C3-4 C4-5 is noted secondary to posterior facet joint hypertrophy. Skull base and vertebrae:  No acute fracture. No primary bone lesion or focal pathologic process. Soft tissues and spinal canal: No prevertebral fluid or swelling. No visible canal hematoma. Disc levels: Severe degenerative disc disease is noted at C5-6 and C6-7 with anterior osteophyte formation. Mild degenerative disc disease is noted at C4-5 and C7-T1. Upper chest: Negative. Other: Degenerative changes seen involving posterior facet joints bilaterally, right greater than left. IMPRESSION: Mild diffuse cortical atrophy. Mild chronic ischemic white matter disease. No acute intracranial abnormality seen. Multilevel degenerative disc disease is noted in the cervical spine. No fracture or other acute abnormality is noted. Electronically Signed   By: Lupita Raider, M.D.   On: 04/22/2018 11:09   Ct Angio Neck W Or Wo Contrast  Result Date: 04/23/2018 CLINICAL DATA:  Subarachnoid hemorrhage, proven, follow-up. EXAM: CT ANGIOGRAPHY HEAD AND NECK TECHNIQUE: Multidetector CT imaging of  the head and neck was performed using the standard protocol during bolus administration of intravenous contrast. Multiplanar CT image reconstructions and MIPs were obtained to evaluate the vascular anatomy. Carotid stenosis measurements (when applicable) are obtained utilizing NASCET criteria, using the distal internal carotid diameter as the denominator. CONTRAST:  50mL ISOVUE-370 IOPAMIDOL (ISOVUE-370) INJECTION 76% COMPARISON:  The of conservatively or FINDINGS: CT HEAD FINDINGS Brain: Subarachnoid hemorrhage within the right middle frontal sulcus is stable. No new areas of hemorrhage are present. Atrophy and white matter disease is noted bilaterally. No mass lesion is present. No significant cortical infarct is present. Vascular: Atherosclerotic calcifications are present within the cavernous internal carotid arteries. There is no hyperdense vessel. Skull: Calvarium is intact. No focal lytic or blastic lesions are present. Asymmetric degenerative changes are present at the right TMJ. Sinuses: The paranasal sinuses and mastoid air cells are clear. Orbits: Left lens replacement is present. Globes and orbits are otherwise within normal limits. Review of the MIP images confirms the above findings CTA NECK FINDINGS Aortic arch: A 3 vessel arch configuration is present. No significant stenosis or aneurysm is present. Right carotid system: The right common carotid artery is within normal limits. Dense atherosclerotic calcifications are present at the bifurcation without a significant stenosis relative to the more distal vessel. Left carotid system: The left common carotid artery is within normal limits. Atherosclerotic calcifications are present at the bifurcation without a significant luminal stenosis relative to the more distal vessel. Vertebral arteries: The vertebral arteries are codominant. Both vertebral arteries originate from the subclavian arteries without a significant stenosis. There is some tortuosity of  the V1 segments bilaterally. No significant stenosis or vessel injury is present to either vertebral artery in the neck. Skeleton: Degenerative anterolisthesis is present at C2-3, C3-4, and C4-5. There is chronic loss of disc height with endplate sclerosis at C5-6 and C6-7. Marked lucency surrounds the roots of the residual right maxillary molar. Dental caries are present without periapical disease. Other neck: The soft tissues the neck are otherwise unremarkable. No significant adenopathy is present. No mucosal lesions are evident. Salivary glands are within normal limits bilaterally. Thyroid is normal. Upper chest: Centrilobular emphysematous changes are present bilaterally. No focal nodule or mass lesion is present. Review of the MIP images confirms the above findings CTA HEAD FINDINGS Anterior circulation: Atherosclerotic calcifications are present within the cavernous internal carotid arteries bilaterally. There is no hyperdense vessel. 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 No definite posterior communicating artery is present. The A1 and M1 segments are normal. The anterior communicating artery is patent. MCA bifurcations are within normal limits  bilaterally. The ACA and MCA branch vessels are normal. No focal vascular lesion is evident at the location of the subarachnoid hemorrhage over the right frontal convexity. Posterior circulation: Vertebral arteries are within normal limits bilaterally. PICA origins are visualized and normal. The basilar artery is normal. Both posterior cerebral arteries originate from the basilar tip. PCA branch vessels are within normal limits bilaterally. Venous sinuses: The dural sinuses are patent. Straight sinus and deep cerebral veins are intact. Cortical veins are normal. Anatomic variants: None Delayed phase: Delayed images demonstrate no pathologic enhancement. White matter disease is accentuated. Review of the MIP images confirms the  above findings IMPRESSION: 1. No acute or focal vascular lesion to explain subarachnoid hemorrhage over the convexity of the right frontal lobe. 2. No mass lesion. 3. Stable atrophy and white matter disease. 4. 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 Recommend follow-up CTA of the head in 1 year to assess stability. This aneurysm does not correspond with the area of subarachnoid hemorrhage. Electronically Signed   By: Marin Roberts M.D.   On: 04/23/2018 19:28   Ct Cervical Spine Wo Contrast  Result Date: 04/22/2018 CLINICAL DATA:  Left arm numbness. EXAM: CT HEAD WITHOUT CONTRAST CT CERVICAL SPINE WITHOUT CONTRAST TECHNIQUE: Multidetector CT imaging of the head and cervical spine was performed following the standard protocol without intravenous contrast. Multiplanar CT image reconstructions of the cervical spine were also generated. COMPARISON:  None. FINDINGS: CT HEAD FINDINGS Brain: Mild diffuse cortical atrophy is noted. Mild chronic ischemic white matter disease is noted. No mass effect or midline shift is noted. Ventricular size is within normal limits. There is no evidence of mass lesion, hemorrhage or acute infarction. Vascular: No hyperdense vessel or unexpected calcification. Skull: Normal. Negative for fracture or focal lesion. Sinuses/Orbits: No acute finding. Other: None. CT CERVICAL SPINE FINDINGS Alignment: Grade 1 anterolisthesis of C3-4 C4-5 is noted secondary to posterior facet joint hypertrophy. Skull base and vertebrae: No acute fracture. No primary bone lesion or focal pathologic process. Soft tissues and spinal canal: No prevertebral fluid or swelling. No visible canal hematoma. Disc levels: Severe degenerative disc disease is noted at C5-6 and C6-7 with anterior osteophyte formation. Mild degenerative disc disease is noted at C4-5 and C7-T1. Upper chest: Negative. Other: Degenerative changes seen involving posterior facet joints bilaterally,  right greater than left. IMPRESSION: Mild diffuse cortical atrophy. Mild chronic ischemic white matter disease. No acute intracranial abnormality seen. Multilevel degenerative disc disease is noted in the cervical spine. No fracture or other acute abnormality is noted. Electronically Signed   By: Lupita Raider, M.D.   On: 04/22/2018 11:09   Mr Brain Wo Contrast (neuro Protocol)  Result Date: 04/22/2018 CLINICAL DATA:  Numbness or tingling, paresthesia. Intermittent left arm numbness for 2 weeks EXAM: MRI HEAD WITHOUT CONTRAST TECHNIQUE: Multiplanar, multiecho pulse sequences of the brain and surrounding structures were obtained without intravenous contrast. COMPARISON:  CT from earlier today FINDINGS: Brain: Small focus of subarachnoid FLAIR hyperintensity and gradient hypointensity along the precentral gyrus on the right, this area is high-density by CT. No other site of hemorrhage seen, either acute or chronic. There is mild for age chronic small vessel ischemia in the cerebral white matter. Mild cerebral volume loss. Clustered appearance of gyri at the left vertex, nonspecific and likely noncontributory. No underlying mass is seen and there is no extrinsic mass effect by collection. No hydrocephalus. Vascular: Major flow voids are preserved Skull and upper cervical spine: No  evidence of marrow lesion. Diffuse cervical facet spurring with mild C3-4 and C4-5 anterolisthesis. Sinuses/Orbits: Left cataract resection. Other: These results were called by telephone at the time of interpretation on 04/22/2018 at 2:10 pm to Dr. Samuel JesterKATHLEEN MCMANUS , who verbally acknowledged these results. IMPRESSION: 1. Small volume subarachnoid hemorrhage along the high right frontal convexity. Trauma, coagulopathy, or vasculopathy/amyloid are primary considerations in this location. There is normal superior sagittal sinus flow voids. No acute or remote hemorrhage seen elsewhere. 2. Mild atrophy and chronic small vessel ischemia for  age. Electronically Signed   By: Marnee SpringJonathon  Watts M.D.   On: 04/22/2018 14:14    Assessment & Plan:   There are no diagnoses linked to this encounter.   No orders of the defined types were placed in this encounter.    Follow-up: No follow-ups on file.  Sonda PrimesAlex Mauricia Mertens, MD

## 2019-08-17 NOTE — Assessment & Plan Note (Signed)
Crestor po

## 2019-08-17 NOTE — Assessment & Plan Note (Signed)
Norvasc, Atenolol 

## 2019-08-17 NOTE — Assessment & Plan Note (Signed)
Keppra

## 2019-08-17 NOTE — Assessment & Plan Note (Signed)
A1c

## 2019-08-17 NOTE — Assessment & Plan Note (Signed)
On Paxil 

## 2019-08-17 NOTE — Patient Instructions (Signed)
Plantar Fasciitis  Plantar fasciitis is a painful foot condition that affects the heel. It occurs when the band of tissue that connects the toes to the heel bone (plantar fascia) becomes irritated. This can happen as the result of exercising too much or doing other repetitive activities (overuse injury). The pain from plantar fasciitis can range from mild irritation to severe pain that makes it difficult to walk or move. The pain is usually worse in the morning after sleeping, or after sitting or lying down for a while. Pain may also be worse after long periods of walking or standing. What are the causes? This condition may be caused by:  Standing for long periods of time.  Wearing shoes that do not have good arch support.  Doing activities that put stress on joints (high-impact activities), including running, aerobics, and ballet.  Being overweight.  An abnormal way of walking (gait).  Tight muscles in the back of your lower leg (calf).  High arches in your feet.  Starting a new athletic activity. What are the signs or symptoms? The main symptom of this condition is heel pain. Pain may:  Be worse with first steps after a time of rest, especially in the morning after sleeping or after you have been sitting or lying down for a while.  Be worse after long periods of standing still.  Decrease after 30-45 minutes of activity, such as gentle walking. How is this diagnosed? This condition may be diagnosed based on your medical history and your symptoms. Your health care provider may ask questions about your activity level. Your health care provider will do a physical exam to check for:  A tender area on the bottom of your foot.  A high arch in your foot.  Pain when you move your foot.  Difficulty moving your foot. You may have imaging tests to confirm the diagnosis, such as:  X-rays.  Ultrasound.  MRI. How is this treated? Treatment for plantar fasciitis depends on how  severe your condition is. Treatment may include:  Rest, ice, applying pressure (compression), and raising the affected foot (elevation). This may be called RICE therapy. Your health care provider may recommend RICE therapy along with over-the-counter pain medicines to manage your pain.  Exercises to stretch your calves and your plantar fascia.  A splint that holds your foot in a stretched, upward position while you sleep (night splint).  Physical therapy to relieve symptoms and prevent problems in the future.  Injections of steroid medicine (cortisone) to relieve pain and inflammation.  Stimulating your plantar fascia with electrical impulses (extracorporeal shock wave therapy). This is usually the last treatment option before surgery.  Surgery, if other treatments have not worked after 12 months. Follow these instructions at home:  Managing pain, stiffness, and swelling  If directed, put ice on the painful area: ? Put ice in a plastic bag, or use a frozen bottle of water. ? Place a towel between your skin and the bag or bottle. ? Roll the bottom of your foot over the bag or bottle. ? Do this for 20 minutes, 2-3 times a day.  Wear athletic shoes that have air-sole or gel-sole cushions, or try wearing soft shoe inserts that are designed for plantar fasciitis.  Raise (elevate) your foot above the level of your heart while you are sitting or lying down. Activity  Avoid activities that cause pain. Ask your health care provider what activities are safe for you.  Do physical therapy exercises and stretches as told   by your health care provider.  Try activities and forms of exercise that are easier on your joints (low-impact). Examples include swimming, water aerobics, and biking. General instructions  Take over-the-counter and prescription medicines only as told by your health care provider.  Wear a night splint while sleeping, if told by your health care provider. Loosen the splint  if your toes tingle, become numb, or turn cold and blue.  Maintain a healthy weight, or work with your health care provider to lose weight as needed.  Keep all follow-up visits as told by your health care provider. This is important. Contact a health care provider if you:  Have symptoms that do not go away after caring for yourself at home.  Have pain that gets worse.  Have pain that affects your ability to move or do your daily activities. Summary  Plantar fasciitis is a painful foot condition that affects the heel. It occurs when the band of tissue that connects the toes to the heel bone (plantar fascia) becomes irritated.  The main symptom of this condition is heel pain that may be worse after exercising too much or standing still for a long time.  Treatment varies, but it usually starts with rest, ice, compression, and elevation (RICE therapy) and over-the-counter medicines to manage pain. This information is not intended to replace advice given to you by your health care provider. Make sure you discuss any questions you have with your health care provider. Document Released: 05/26/2001 Document Revised: 08/13/2017 Document Reviewed: 06/28/2017 Elsevier Patient Education  2020 [employer]  

## 2019-08-22 ENCOUNTER — Other Ambulatory Visit: Payer: Self-pay

## 2019-08-22 ENCOUNTER — Other Ambulatory Visit (INDEPENDENT_AMBULATORY_CARE_PROVIDER_SITE_OTHER): Payer: Medicare Other

## 2019-08-22 DIAGNOSIS — E785 Hyperlipidemia, unspecified: Secondary | ICD-10-CM | POA: Diagnosis not present

## 2019-08-22 DIAGNOSIS — E119 Type 2 diabetes mellitus without complications: Secondary | ICD-10-CM

## 2019-08-22 LAB — BASIC METABOLIC PANEL
BUN: 16 mg/dL (ref 6–23)
CO2: 27 mEq/L (ref 19–32)
Calcium: 9.3 mg/dL (ref 8.4–10.5)
Chloride: 101 mEq/L (ref 96–112)
Creatinine, Ser: 1.08 mg/dL (ref 0.40–1.20)
GFR: 48.15 mL/min — ABNORMAL LOW (ref >60.00)
Glucose, Bld: 96 mg/dL (ref 70–99)
Potassium: 4.4 mEq/L (ref 3.5–5.1)
Sodium: 136 mEq/L (ref 135–145)

## 2019-08-22 LAB — HEMOGLOBIN A1C: Hgb A1c MFr Bld: 6.1 % (ref 4.6–6.5)

## 2019-08-22 NOTE — Patient Outreach (Signed)
[place] [facility]) Care Management  08/22/2019  AOI KOUNS 02/02/1932 [id number]  Medication Adherence call to Mrs. Korina Tretter [facility] Identifiers Verify spoke with patient she is past due on Rosuvastatin 20 mg,patient explain she was a bit confuse when taking this medication and was taking it different than what the prescription said 3 times a week,she explain some times she will only take as needed,patient has already gone over with her doctor,but patient is now taking it 3 times a week.Mrs. Ourada is showing past due under [place].   [place] Management Direct Dial [phone]  Fax [phone] [email or web address]

## 2019-11-30 ENCOUNTER — Telehealth: Payer: Self-pay

## 2019-11-30 ENCOUNTER — Telehealth: Payer: Self-pay | Admitting: Neurology

## 2019-11-30 NOTE — Telephone Encounter (Signed)
I called pt that per Dr.Sethi he wants her to keep taking the keppra 750mg  one pill twice a day.Pt stated is tolerating it well with no side effects or seizure activity.. Pt will continue taking it.She verbalized understanding.

## 2019-11-30 NOTE — Telephone Encounter (Signed)
Pt was asking about her Keppra medication and was notified neurology is the one who prescribes this medication so they will have to be the one to make any changes. TE already created with Neurology.

## 2019-11-30 NOTE — Telephone Encounter (Signed)
Pt has called, she would like to discuss lowering her dosage of levETIRAcetam (KEPPRA) 750 MG tablet, please call

## 2019-11-30 NOTE — Telephone Encounter (Signed)
New message    The patient calls need the CMA to call her to ask questions about medication. Will discuss further when she calls.

## 2019-11-30 NOTE — Telephone Encounter (Signed)
LMTCB

## 2019-12-08 ENCOUNTER — Other Ambulatory Visit: Payer: Self-pay | Admitting: Internal Medicine

## 2019-12-18 ENCOUNTER — Ambulatory Visit (INDEPENDENT_AMBULATORY_CARE_PROVIDER_SITE_OTHER): Payer: Medicare Other | Admitting: Internal Medicine

## 2019-12-18 ENCOUNTER — Other Ambulatory Visit: Payer: Self-pay

## 2019-12-18 ENCOUNTER — Encounter: Payer: Self-pay | Admitting: Internal Medicine

## 2019-12-18 DIAGNOSIS — G459 Transient cerebral ischemic attack, unspecified: Secondary | ICD-10-CM

## 2019-12-18 DIAGNOSIS — M79606 Pain in leg, unspecified: Secondary | ICD-10-CM

## 2019-12-18 DIAGNOSIS — E785 Hyperlipidemia, unspecified: Secondary | ICD-10-CM | POA: Diagnosis not present

## 2019-12-18 DIAGNOSIS — I10 Essential (primary) hypertension: Secondary | ICD-10-CM | POA: Diagnosis not present

## 2019-12-18 DIAGNOSIS — F411 Generalized anxiety disorder: Secondary | ICD-10-CM

## 2019-12-18 DIAGNOSIS — M2550 Pain in unspecified joint: Secondary | ICD-10-CM

## 2019-12-18 DIAGNOSIS — R569 Unspecified convulsions: Secondary | ICD-10-CM

## 2019-12-18 DIAGNOSIS — I5189 Other ill-defined heart diseases: Secondary | ICD-10-CM

## 2019-12-18 NOTE — Progress Notes (Addendum)
Subjective:  Patient ID: Vickie Wilson, female    DOB: [birth year]  Age: 86 y.o. MRN: [id number]  CC: No chief complaint on file.   HPI Vickie Wilson presents for HTN, anxiety, seizure disorder f/u C/o LE pains  Outpatient Medications Prior to Visit  Medication Sig Dispense Refill  . amLODipine (NORVASC) 5 MG tablet TAKE 1 TABLET ONCE DAILY. 90 tablet 3  . atenolol (TENORMIN) 100 MG tablet TAKE (1/2) TABLET TWICE DAILY. 90 tablet 3  . Cholecalciferol (VITAMIN D3) 50 MCG (2000 UT) capsule Take 1 capsule (2,000 Units total) by mouth daily. 100 capsule 3  . levETIRAcetam (KEPPRA) 750 MG tablet Take 1 tablet (750 mg total) by mouth 2 (two) times daily. 180 tablet 3  . Multiple Vitamins-Minerals (CENTRUM SILVER) CHEW Chew 1 each by mouth 3 (three) times a week.     Marland Kitchen. PARoxetine (PAXIL) 10 MG tablet TAKE 1 TABLET IN THE MORNING. 90 tablet 1  . rosuvastatin (CRESTOR) 20 MG tablet 0.5 tablet M-W-F 45 tablet 3   No facility-administered medications prior to visit.    ROS: Review of Systems  Constitutional: Negative for activity change, appetite change, chills, fatigue and unexpected weight change.  HENT: Negative for congestion, mouth sores and sinus pressure.   Eyes: Negative for visual disturbance.  Respiratory: Negative for cough and chest tightness.   Gastrointestinal: Negative for abdominal pain and nausea.  Genitourinary: Negative for difficulty urinating, frequency and vaginal pain.  Musculoskeletal: Positive for arthralgias. Negative for back pain and gait problem.  Skin: Negative for pallor and rash.  Neurological: Negative for dizziness, tremors, weakness, numbness and headaches.  Psychiatric/Behavioral: Negative for confusion and sleep disturbance.    Objective:  BP 136/80 (BP Location: Left Arm, Patient Position: Sitting, Cuff Size: Normal)   Pulse 61   Temp 98 F (36.7 C) (Oral)   Ht 5\' 2"  (1.575 m)   Wt 115 lb 2 oz (52.2 kg)   SpO2 93%   BMI 21.06 kg/m   BP  Readings from Last 3 Encounters:  12/18/19 136/80  08/17/19 132/84  05/18/19 140/80    Wt Readings from Last 3 Encounters:  12/18/19 115 lb 2 oz (52.2 kg)  08/17/19 115 lb (52.2 kg)  05/18/19 117 lb (53.1 kg)    Physical Exam Constitutional:      General: She is not in acute distress.    Appearance: She is well-developed.  HENT:     Head: Normocephalic.     Right Ear: External ear normal.     Left Ear: External ear normal.     Nose: Nose normal.  Eyes:     General:        Right eye: No discharge.        Left eye: No discharge.     Conjunctiva/sclera: Conjunctivae normal.     Pupils: Pupils are equal, round, and reactive to light.  Neck:     Thyroid: No thyromegaly.     Vascular: No JVD.     Trachea: No tracheal deviation.  Cardiovascular:     Rate and Rhythm: Normal rate and regular rhythm.     Heart sounds: Normal heart sounds.  Pulmonary:     Effort: No respiratory distress.     Breath sounds: No stridor. No wheezing.  Abdominal:     General: Bowel sounds are normal. There is no distension.     Palpations: Abdomen is soft. There is no mass.     Tenderness: There is no abdominal tenderness. There  is no guarding or rebound.  Musculoskeletal:        General: No tenderness.     Cervical back: Normal range of motion and neck supple.  Lymphadenopathy:     Cervical: No cervical adenopathy.  Skin:    Findings: No erythema or rash.  Neurological:     Cranial Nerves: No cranial nerve deficit.     Motor: No abnormal muscle tone.     Coordination: Coordination normal.     Deep Tendon Reflexes: Reflexes normal.  Psychiatric:        Behavior: Behavior normal.        Thought Content: Thought content normal.        Judgment: Judgment normal.     Lab Results  Component Value Date   WBC 7.8 02/22/2019   HGB 12.5 02/22/2019   HCT 38.0 02/22/2019   PLT 324.0 02/22/2019   GLUCOSE 96 08/22/2019   CHOL 205 (H) 05/18/2019   TRIG 127.0 05/18/2019   HDL 54.30 05/18/2019     LDLDIRECT 125.6 01/16/2013   LDLCALC 125 (H) 05/18/2019   ALT 10 05/18/2019   AST 22 05/18/2019   NA 136 08/22/2019   K 4.4 08/22/2019   CL 101 08/22/2019   CREATININE 1.08 08/22/2019   BUN 16 08/22/2019   CO2 27 08/22/2019   TSH 1.93 02/22/2019   INR 0.92 04/22/2018   HGBA1C 6.1 08/22/2019   MICROALBUR 2.8 (H) 09/17/2016    CT ANGIO HEAD W OR WO CONTRAST  Result Date: 04/23/2018 CLINICAL DATA:  Subarachnoid hemorrhage, proven, follow-up. EXAM: CT ANGIOGRAPHY HEAD AND NECK TECHNIQUE: Multidetector CT imaging of the head and neck was performed using the standard protocol during bolus administration of intravenous contrast. Multiplanar CT image reconstructions and MIPs were obtained to evaluate the vascular anatomy. Carotid stenosis measurements (when applicable) are obtained utilizing NASCET criteria, using the distal internal carotid diameter as the denominator. CONTRAST:  61mL ISOVUE-370 IOPAMIDOL (ISOVUE-370) INJECTION 76% COMPARISON:  The of conservatively or FINDINGS: CT HEAD FINDINGS Brain: Subarachnoid hemorrhage within the right middle frontal sulcus is stable. No new areas of hemorrhage are present. Atrophy and white matter disease is noted bilaterally. No mass lesion is present. No significant cortical infarct is present. Vascular: Atherosclerotic calcifications are present within the cavernous internal carotid arteries. There is no hyperdense vessel. Skull: Calvarium is intact. No focal lytic or blastic lesions are present. Asymmetric degenerative changes are present at the right TMJ. Sinuses: The paranasal sinuses and mastoid air cells are clear. Orbits: Left lens replacement is present. Globes and orbits are otherwise within normal limits. Review of the MIP images confirms the above findings CTA NECK FINDINGS Aortic arch: A 3 vessel arch configuration is present. No significant stenosis or aneurysm is present. Right carotid system: The right common carotid artery is within normal  limits. Dense atherosclerotic calcifications are present at the bifurcation without a significant stenosis relative to the more distal vessel. Left carotid system: The left common carotid artery is within normal limits. Atherosclerotic calcifications are present at the bifurcation without a significant luminal stenosis relative to the more distal vessel. Vertebral arteries: The vertebral arteries are codominant. Both vertebral arteries originate from the subclavian arteries without a significant stenosis. There is some tortuosity of the V1 segments bilaterally. No significant stenosis or vessel injury is present to either vertebral artery in the neck. Skeleton: Degenerative anterolisthesis is present at C2-3, C3-4, and C4-5. There is chronic loss of disc height with endplate sclerosis at C5-6 and C6-7. Marked lucency  surrounds the roots of the residual right maxillary molar. Dental caries are present without periapical disease. Other neck: The soft tissues the neck are otherwise unremarkable. No significant adenopathy is present. No mucosal lesions are evident. Salivary glands are within normal limits bilaterally. Thyroid is normal. Upper chest: Centrilobular emphysematous changes are present bilaterally. No focal nodule or mass lesion is present. Review of the MIP images confirms the above findings CTA HEAD FINDINGS Anterior circulation: Atherosclerotic calcifications are present within the cavernous internal carotid arteries bilaterally. There is no hyperdense vessel. 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 No definite posterior communicating artery is present. The A1 and M1 segments are normal. The anterior communicating artery is patent. MCA bifurcations are within normal limits bilaterally. The ACA and MCA branch vessels are normal. No focal vascular lesion is evident at the location of the subarachnoid hemorrhage over the right frontal convexity. Posterior circulation:  Vertebral arteries are within normal limits bilaterally. PICA origins are visualized and normal. The basilar artery is normal. Both posterior cerebral arteries originate from the basilar tip. PCA branch vessels are within normal limits bilaterally. Venous sinuses: The dural sinuses are patent. Straight sinus and deep cerebral veins are intact. Cortical veins are normal. Anatomic variants: None Delayed phase: Delayed images demonstrate no pathologic enhancement. White matter disease is accentuated. Review of the MIP images confirms the above findings IMPRESSION: 1. No acute or focal vascular lesion to explain subarachnoid hemorrhage over the convexity of the right frontal lobe. 2. No mass lesion. 3. Stable atrophy and white matter disease. 4. 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 Recommend follow-up CTA of the head in 1 year to assess stability. This aneurysm does not correspond with the area of subarachnoid hemorrhage. Electronically Signed   By: San Morelle M.D.   On: 04/23/2018 19:28   DG Chest 2 View  Result Date: 04/22/2018 CLINICAL DATA:  Intermittent LEFT arm numbness for 2 weeks, history hypertension, diabetes mellitus, anxiety, former smoker EXAM: CHEST - 2 VIEW COMPARISON:  02/12/2018 FINDINGS: Normal heart size, mediastinal contours, and pulmonary vascularity. Atherosclerotic calcification aorta. Lungs hyperinflated with minimal subsegmental atelectasis at LEFT costophrenic angle. Remaining lungs clear. No acute infiltrate, pleural effusion or pneumothorax. Bones appear demineralized with old healed fracture of the posterolateral RIGHT sixth rib. IMPRESSION: Hyperinflated lungs with minimal subsegmental atelectasis at LEFT costophrenic angle. Electronically Signed   By: Lavonia Dana M.D.   On: 04/22/2018 11:07   CT HEAD WO CONTRAST  Result Date: 04/22/2018 CLINICAL DATA:  Left arm numbness. EXAM: CT HEAD WITHOUT CONTRAST CT CERVICAL SPINE WITHOUT  CONTRAST TECHNIQUE: Multidetector CT imaging of the head and cervical spine was performed following the standard protocol without intravenous contrast. Multiplanar CT image reconstructions of the cervical spine were also generated. COMPARISON:  None. FINDINGS: CT HEAD FINDINGS Brain: Mild diffuse cortical atrophy is noted. Mild chronic ischemic white matter disease is noted. No mass effect or midline shift is noted. Ventricular size is within normal limits. There is no evidence of mass lesion, hemorrhage or acute infarction. Vascular: No hyperdense vessel or unexpected calcification. Skull: Normal. Negative for fracture or focal lesion. Sinuses/Orbits: No acute finding. Other: None. CT CERVICAL SPINE FINDINGS Alignment: Grade 1 anterolisthesis of C3-4 C4-5 is noted secondary to posterior facet joint hypertrophy. Skull base and vertebrae: No acute fracture. No primary bone lesion or focal pathologic process. Soft tissues and spinal canal: No prevertebral fluid or swelling. No visible canal hematoma. Disc levels:  Severe degenerative disc disease is noted at C5-6 and C6-7 with anterior osteophyte formation. Mild degenerative disc disease is noted at C4-5 and C7-T1. Upper chest: Negative. Other: Degenerative changes seen involving posterior facet joints bilaterally, right greater than left. IMPRESSION: Mild diffuse cortical atrophy. Mild chronic ischemic white matter disease. No acute intracranial abnormality seen. Multilevel degenerative disc disease is noted in the cervical spine. No fracture or other acute abnormality is noted. Electronically Signed   By: Lupita Raider, M.D.   On: 04/22/2018 11:09   CT ANGIO NECK W OR WO CONTRAST  Result Date: 04/23/2018 CLINICAL DATA:  Subarachnoid hemorrhage, proven, follow-up. EXAM: CT ANGIOGRAPHY HEAD AND NECK TECHNIQUE: Multidetector CT imaging of the head and neck was performed using the standard protocol during bolus administration of intravenous contrast. Multiplanar CT  image reconstructions and MIPs were obtained to evaluate the vascular anatomy. Carotid stenosis measurements (when applicable) are obtained utilizing NASCET criteria, using the distal internal carotid diameter as the denominator. CONTRAST:  43mL ISOVUE-370 IOPAMIDOL (ISOVUE-370) INJECTION 76% COMPARISON:  The of conservatively or FINDINGS: CT HEAD FINDINGS Brain: Subarachnoid hemorrhage within the right middle frontal sulcus is stable. No new areas of hemorrhage are present. Atrophy and white matter disease is noted bilaterally. No mass lesion is present. No significant cortical infarct is present. Vascular: Atherosclerotic calcifications are present within the cavernous internal carotid arteries. There is no hyperdense vessel. Skull: Calvarium is intact. No focal lytic or blastic lesions are present. Asymmetric degenerative changes are present at the right TMJ. Sinuses: The paranasal sinuses and mastoid air cells are clear. Orbits: Left lens replacement is present. Globes and orbits are otherwise within normal limits. Review of the MIP images confirms the above findings CTA NECK FINDINGS Aortic arch: A 3 vessel arch configuration is present. No significant stenosis or aneurysm is present. Right carotid system: The right common carotid artery is within normal limits. Dense atherosclerotic calcifications are present at the bifurcation without a significant stenosis relative to the more distal vessel. Left carotid system: The left common carotid artery is within normal limits. Atherosclerotic calcifications are present at the bifurcation without a significant luminal stenosis relative to the more distal vessel. Vertebral arteries: The vertebral arteries are codominant. Both vertebral arteries originate from the subclavian arteries without a significant stenosis. There is some tortuosity of the V1 segments bilaterally. No significant stenosis or vessel injury is present to either vertebral artery in the neck. Skeleton:  Degenerative anterolisthesis is present at C2-3, C3-4, and C4-5. There is chronic loss of disc height with endplate sclerosis at C5-6 and C6-7. Marked lucency surrounds the roots of the residual right maxillary molar. Dental caries are present without periapical disease. Other neck: The soft tissues the neck are otherwise unremarkable. No significant adenopathy is present. No mucosal lesions are evident. Salivary glands are within normal limits bilaterally. Thyroid is normal. Upper chest: Centrilobular emphysematous changes are present bilaterally. No focal nodule or mass lesion is present. Review of the MIP images confirms the above findings CTA HEAD FINDINGS Anterior circulation: Atherosclerotic calcifications are present within the cavernous internal carotid arteries bilaterally. There is no hyperdense vessel. 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 No definite posterior communicating artery is present. The A1 and M1 segments are normal. The anterior communicating artery is patent. MCA bifurcations are within normal limits bilaterally. The ACA and MCA branch vessels are normal. No focal vascular lesion is evident at the location of the subarachnoid hemorrhage over the right frontal convexity.  Posterior circulation: Vertebral arteries are within normal limits bilaterally. PICA origins are visualized and normal. The basilar artery is normal. Both posterior cerebral arteries originate from the basilar tip. PCA branch vessels are within normal limits bilaterally. Venous sinuses: The dural sinuses are patent. Straight sinus and deep cerebral veins are intact. Cortical veins are normal. Anatomic variants: None Delayed phase: Delayed images demonstrate no pathologic enhancement. White matter disease is accentuated. Review of the MIP images confirms the above findings IMPRESSION: 1. No acute or focal vascular lesion to explain subarachnoid hemorrhage over the convexity of the right  frontal lobe. 2. No mass lesion. 3. Stable atrophy and white matter disease. 4. 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 Recommend follow-up CTA of the head in 1 year to assess stability. This aneurysm does not correspond with the area of subarachnoid hemorrhage. Electronically Signed   By: Marin Roberts M.D.   On: 04/23/2018 19:28   CT Cervical Spine Wo Contrast  Result Date: 04/22/2018 CLINICAL DATA:  Left arm numbness. EXAM: CT HEAD WITHOUT CONTRAST CT CERVICAL SPINE WITHOUT CONTRAST TECHNIQUE: Multidetector CT imaging of the head and cervical spine was performed following the standard protocol without intravenous contrast. Multiplanar CT image reconstructions of the cervical spine were also generated. COMPARISON:  None. FINDINGS: CT HEAD FINDINGS Brain: Mild diffuse cortical atrophy is noted. Mild chronic ischemic white matter disease is noted. No mass effect or midline shift is noted. Ventricular size is within normal limits. There is no evidence of mass lesion, hemorrhage or acute infarction. Vascular: No hyperdense vessel or unexpected calcification. Skull: Normal. Negative for fracture or focal lesion. Sinuses/Orbits: No acute finding. Other: None. CT CERVICAL SPINE FINDINGS Alignment: Grade 1 anterolisthesis of C3-4 C4-5 is noted secondary to posterior facet joint hypertrophy. Skull base and vertebrae: No acute fracture. No primary bone lesion or focal pathologic process. Soft tissues and spinal canal: No prevertebral fluid or swelling. No visible canal hematoma. Disc levels: Severe degenerative disc disease is noted at C5-6 and C6-7 with anterior osteophyte formation. Mild degenerative disc disease is noted at C4-5 and C7-T1. Upper chest: Negative. Other: Degenerative changes seen involving posterior facet joints bilaterally, right greater than left. IMPRESSION: Mild diffuse cortical atrophy. Mild chronic ischemic white matter disease. No acute intracranial  abnormality seen. Multilevel degenerative disc disease is noted in the cervical spine. No fracture or other acute abnormality is noted. Electronically Signed   By: Lupita Raider, M.D.   On: 04/22/2018 11:09   MR Brain Wo Contrast (neuro protocol)  Result Date: 04/22/2018 CLINICAL DATA:  Numbness or tingling, paresthesia. Intermittent left arm numbness for 2 weeks EXAM: MRI HEAD WITHOUT CONTRAST TECHNIQUE: Multiplanar, multiecho pulse sequences of the brain and surrounding structures were obtained without intravenous contrast. COMPARISON:  CT from earlier today FINDINGS: Brain: Small focus of subarachnoid FLAIR hyperintensity and gradient hypointensity along the precentral gyrus on the right, this area is high-density by CT. No other site of hemorrhage seen, either acute or chronic. There is mild for age chronic small vessel ischemia in the cerebral white matter. Mild cerebral volume loss. Clustered appearance of gyri at the left vertex, nonspecific and likely noncontributory. No underlying mass is seen and there is no extrinsic mass effect by collection. No hydrocephalus. Vascular: Major flow voids are preserved Skull and upper cervical spine: No evidence of marrow lesion. Diffuse cervical facet spurring with mild C3-4 and C4-5 anterolisthesis. Sinuses/Orbits: Left cataract resection. Other: These results were called by telephone at the  time of interpretation on 04/22/2018 at 2:10 pm to Dr. Samuel Jester , who verbally acknowledged these results. IMPRESSION: 1. Small volume subarachnoid hemorrhage along the high right frontal convexity. Trauma, coagulopathy, or vasculopathy/amyloid are primary considerations in this location. There is normal superior sagittal sinus flow voids. No acute or remote hemorrhage seen elsewhere. 2. Mild atrophy and chronic small vessel ischemia for age. Electronically Signed   By: Marnee Spring M.D.   On: 04/22/2018 14:14   EEG adult  Result Date: 04/23/2018 Rejeana Brock, MD     04/23/2018  1:34 PM History: 86 yo F with intermittent numbness Sedation: None Technique: This is a 21 channel routine scalp EEG performed at the bedside with bipolar and monopolar montages arranged in accordance to the international 10/20 system of electrode placement. One channel was dedicated to EKG recording. Background: The background consists of intermixed alpha and beta activities. There is a well defined posterior dominant rhythm of 9-10 Hz that attenuates with eye opening. Sleep is not recorded. Photic stimulation: Physiologic driving is not performed EEG Abnormalities: None Clinical Interpretation: This normal EEG is recorded in the waking state. There was no seizure or seizure predisposition recorded on this study. Please note that a normal EEG does not preclude the possibility of epilepsy. Ritta Slot, MD [facility] [phone] If 7pm- 7am, please page neurology on call as listed in AMION.   ECHOCARDIOGRAM COMPLETE  Result Date: 04/23/2018                            [facility]*                   *[facility]*                         [address]                        [place], [place] [address]                            [phone] ------------------------------------------------------------------- Transthoracic Echocardiography Patient:    Arielis, Leonhart MR #:       [id number] Study Date: 04/23/2018 Gender:     F Age:        87 Height:     154.9 cm Weight:     50.6 kg BSA:        1.48 m^2 Pt. Status: Room:       3W19C  ATTENDING    Samuel Jester [id number]  PERFORMING   [facility], Inpatient  ADMITTING    Emokpae, Courage  ORDERING     Emokpae, Courage  REFERRING    Emokpae, Courage  SONOGRAPHER  Belva Chimes cc: ------------------------------------------------------------------- LV EF: 55% -   60% ------------------------------------------------------------------- Indications:      TIA 435.9.  ------------------------------------------------------------------- Study Conclusions - Left ventricle: The cavity size was normal. Wall thickness was   normal. Systolic function was normal. The estimated ejection   fraction was in the range of 55% to 60%. Wall motion was normal;   there were no regional wall motion abnormalities. Doppler   parameters are consistent with abnormal left ventricular   relaxation (grade 1 diastolic dysfunction). - Aortic valve: Valve area (VTI): 2.42 cm^2. Valve area (Vmax):   2.38 cm^2. Valve area (Vmean): 2.23 cm^2. ------------------------------------------------------------------- Study data:  Comparison  was made to the study of 03/02/2018.  Study status:  Routine.  Procedure:  Transthoracic echocardiography. Image quality was good.  Study completion:  There were no complications.          Transthoracic echocardiography.  2D, spectral Doppler, and color Doppler.  Birthdate:  Patient birthdate: September 03, 1931.  Age:  Patient is 86 yr old.  Sex:  Gender: female.    BMI: 21.1 kg/m^2.  Blood pressure:     131/72  Patient status:  Inpatient.  Study date:  Study date: 04/23/2018. Study time: 02:16 PM.  Location:  Bedside. ------------------------------------------------------------------- ------------------------------------------------------------------- Left ventricle:  The cavity size was normal. Wall thickness was normal. Systolic function was normal. The estimated ejection fraction was in the range of 55% to 60%. Wall motion was normal; there were no regional wall motion abnormalities. Doppler parameters are consistent with abnormal left ventricular relaxation (grade 1 diastolic dysfunction). ------------------------------------------------------------------- Aortic valve:   Mildly calcified leaflets. Cusp separation was normal.  Doppler:  Transvalvular velocity was within the normal range. There was no stenosis. There was no regurgitation.    VTI ratio of LVOT to aortic valve: 0.77.  Valve area (VTI): 2.42 cm^2. Indexed valve area (VTI): 1.64 cm^2/m^2. Peak velocity ratio of LVOT to aortic valve: 0.76. Valve area (Vmax): 2.38 cm^2. Indexed valve area (Vmax): 1.61 cm^2/m^2. Mean velocity ratio of LVOT to aortic valve: 0.71. Valve area (Vmean): 2.23 cm^2. Indexed valve area (Vmean): 1.51 cm^2/m^2.    Mean gradient (S): 3 mm Hg. Peak gradient (S): 5 mm Hg. ------------------------------------------------------------------- Aorta:  Aortic root: The aortic root was normal in size. Ascending aorta: The ascending aorta was normal in size. ------------------------------------------------------------------- Mitral valve:   Structurally normal valve.   Leaflet separation was normal.  Doppler:  Transvalvular velocity was within the normal range. There was no evidence for stenosis. There was no regurgitation. ------------------------------------------------------------------- Left atrium:  The atrium was normal in size. ------------------------------------------------------------------- Right ventricle:  The cavity size was normal. Wall thickness was normal. Systolic function was normal. ------------------------------------------------------------------- Pulmonic valve:   Poorly visualized.  Doppler:  There was no significant regurgitation. ------------------------------------------------------------------- Tricuspid valve:   Structurally normal valve.   Leaflet separation was normal.  Doppler:  Transvalvular velocity was within the normal range. There was mild regurgitation. ------------------------------------------------------------------- Right atrium:  The atrium was normal in size. ------------------------------------------------------------------- Pericardium:  There was no pericardial effusion. ------------------------------------------------------------------- Systemic veins: Inferior vena cava: The vessel was normal in size. The respirophasic diameter changes were in the normal range (>= 50%),  consistent with normal central venous pressure. ------------------------------------------------------------------- Measurements  Left ventricle                           Value          Reference  LV ID, ED, PLAX chordal          (L)     42.7  mm       43 - 52  LV ID, ES, PLAX chordal                  29.4  mm       23 - 38  LV fx shortening, PLAX chordal           31    %        >=29  LV PW thickness, ED                      4.55  mm       ----------  IVS/LV PW ratio, ED              (H)     1.58           <=1.3  Stroke volume, 2D                        65    ml       ----------  Stroke volume/bsa, 2D                    44    ml/m^2   ----------  LV e&', lateral                           5.87  cm/s     ----------  LV E/e&', lateral                         10.75          ----------  LV s&', lateral                           6.42  cm/s     ----------  LV e&', medial                            4.24  cm/s     ----------  LV E/e&', medial                          14.88          ----------  LV e&', average                           5.06  cm/s     ----------  LV E/e&', average                         12.48          ----------   Ventricular septum                       Value          Reference  IVS thickness, ED                        7.2   mm       ----------   LVOT                                     Value          Reference  LVOT ID, S                               20    mm       ----------  LVOT area                                3.14  cm^2     ----------  LVOT peak  velocity, S                    87.9  cm/s     ----------  LVOT mean velocity, S                    58.4  cm/s     ----------  LVOT VTI, S                              20.7  cm       ----------   Aortic valve                             Value          Reference  Aortic valve peak velocity, S            116   cm/s     ----------  Aortic valve mean velocity, S            82.4  cm/s     ----------  Aortic valve VTI, S                      26.9  cm        ----------  Aortic mean gradient, S                  3     mm Hg    ----------  Aortic peak gradient, S                  5     mm Hg    ----------  VTI ratio, LVOT/AV                       0.77           ----------  Aortic valve area, VTI                   2.42  cm^2     ----------  Aortic valve area/bsa, VTI               1.64  cm^2/m^2 ----------  Velocity ratio, peak, LVOT/AV            0.76           ----------  Aortic valve area, peak velocity         2.38  cm^2     ----------  Aortic valve area/bsa, peak              1.61  cm^2/m^2 ----------  velocity  Velocity ratio, mean, LVOT/AV            0.71           ----------  Aortic valve area, mean velocity         2.23  cm^2     ----------  Aortic valve area/bsa, mean              1.51  cm^2/m^2 ----------  velocity   Aorta                                    Value          Reference  Aortic root ID, ED  27    mm       ----------   Left atrium                              Value          Reference  LA ID, A-P, ES                           25    mm       ----------  LA ID/bsa, A-P                           1.69  cm/m^2   <=2.2  LA volume, S                             38.7  ml       ----------  LA volume/bsa, S                         26.2  ml/m^2   ----------  LA volume, ES, 1-p A4C                   31.7  ml       ----------  LA volume/bsa, ES, 1-p A4C               21.5  ml/m^2   ----------  LA volume, ES, 1-p A2C                   45.7  ml       ----------  LA volume/bsa, ES, 1-p A2C               30.9  ml/m^2   ----------   Mitral valve                             Value          Reference  Mitral E-wave peak velocity              63.1  cm/s     ----------  Mitral A-wave peak velocity              95.1  cm/s     ----------  Mitral deceleration time         (H)     363   ms       150 - 230  Mitral E/A ratio, peak                   0.7            ----------   Pulmonary veins                          Value          Reference  Pulmonary vein peak  velocity, S          50    cm/s     ----------  Pulmonary vein peak velocity, D          28    cm/s     ----------  Pulmonary vein velocity ratio,           1.8            ----------  peak, S/D   Pulmonary arteries                       Value          Reference  PA pressure, S, DP                       27    mm Hg    <=30   Tricuspid valve                          Value          Reference  Tricuspid regurg peak velocity           247   cm/s     ----------  Tricuspid peak RV-RA gradient            24    mm Hg    ----------   Right atrium                             Value          Reference  RA ID, S-I, ES, A4C              (H)     49.8  mm       34 - 49  RA area, ES, A4C                         13.2  cm^2     8.3 - 19.5  RA volume, ES, A/L                       29    ml       ----------  RA volume/bsa, ES, A/L                   19.6  ml/m^2   ----------   Systemic veins                           Value          Reference  Estimated CVP                            3     mm Hg    ----------   Right ventricle                          Value          Reference  RV ID, ED, PLAX                          33.1  mm       19 - 38  TAPSE                                    19.2  mm       ----------  RV pressure, S, DP                       27    mm Hg    <=30  RV s&', lateral, S                        10.4  cm/s     ----------   Pulmonic valve                           Value          Reference  Pulmonic valve peak velocity, S          84.2  cm/s     ---------- Legend: (L)  and  (H)  mark values outside specified reference range. ------------------------------------------------------------------- Prepared and Electronically Authenticated by Nicholes Mango, MD 2019-08-10T15:35:38   Assessment & Plan:   There are no diagnoses linked to this encounter.   No orders of the defined types were placed in this encounter.    Follow-up: No follow-ups on file.  Sonda Primes, MD

## 2019-12-18 NOTE — Assessment & Plan Note (Signed)
Paxil 

## 2019-12-18 NOTE — Assessment & Plan Note (Signed)
Probable TIA 7/19 L arm/face

## 2019-12-18 NOTE — Assessment & Plan Note (Signed)
Norvasc, Atenolol 

## 2019-12-18 NOTE — Assessment & Plan Note (Signed)
-   Crestor 

## 2019-12-18 NOTE — Assessment & Plan Note (Addendum)
Hands w/OA - Tylenol prn, Vit D, massages

## 2019-12-18 NOTE — Assessment & Plan Note (Signed)
F/u w/Dr [place]

## 2019-12-18 NOTE — Assessment & Plan Note (Signed)
Keppra

## 2020-01-01 ENCOUNTER — Telehealth: Payer: Self-pay | Admitting: Internal Medicine

## 2020-01-01 DIAGNOSIS — M79604 Pain in right leg: Secondary | ICD-10-CM

## 2020-01-01 DIAGNOSIS — M79605 Pain in left leg: Secondary | ICD-10-CM

## 2020-01-01 NOTE — Telephone Encounter (Signed)
Is it PT? Thx

## 2020-01-01 NOTE — Telephone Encounter (Signed)
   Patient requesting order for [facility] Therapy phone [phone] She was unable to provide any additional details of request. She has an appointment this week .

## 2020-01-01 NOTE — Telephone Encounter (Signed)
Please advise about referral, pt states this was discussed as last OV for her legs and hands?

## 2020-01-02 NOTE — Telephone Encounter (Signed)
yes

## 2020-01-03 NOTE — Assessment & Plan Note (Signed)
MSK/OA PT

## 2020-01-03 NOTE — Addendum Note (Signed)
Addended by: Tresa Garter on: 01/03/2020 01:06 PM   Modules accepted: Orders

## 2020-01-04 ENCOUNTER — Ambulatory Visit: Payer: Medicare Other | Attending: Internal Medicine

## 2020-01-04 ENCOUNTER — Other Ambulatory Visit: Payer: Self-pay

## 2020-01-04 DIAGNOSIS — M79604 Pain in right leg: Secondary | ICD-10-CM | POA: Diagnosis not present

## 2020-01-04 DIAGNOSIS — M79605 Pain in left leg: Secondary | ICD-10-CM | POA: Diagnosis not present

## 2020-01-04 DIAGNOSIS — M6281 Muscle weakness (generalized): Secondary | ICD-10-CM | POA: Diagnosis not present

## 2020-01-04 NOTE — Patient Instructions (Signed)
Access Code: [email or web address] URL: [email or web address] Date: 01/04/2020 Prepared by: Tresa Endo  Exercises Standing Heel Raise with Support - 2 x daily - 7 x weekly - 2 sets - 10 reps Seated Hamstring Stretch - 3 x daily - 7 x weekly - 1 sets - 3 reps - 20 hold Seated Long Arc Quad - 3 x daily - 7 x weekly - 2 sets - 10 reps - 5 hold

## 2020-01-04 NOTE — Therapy (Signed)
[facility] [address] [place], [place], [address] Phone: [phone]   Fax:  [phone]  Physical Therapy Evaluation  Patient Details  Name: Vickie Wilson MRN: [id number] Date of Birth: 12/27/1930 Referring Provider (PT): Jacinta Shoe, MD   Encounter Date: 01/04/2020  PT End of Session - 01/04/20 1317    Visit Number  1    Date for PT Re-Evaluation  02/29/20    Authorization Type  [employer] Medicare    PT Start Time  1232    PT Stop Time  1315    PT Time Calculation (min)  43 min    Activity Tolerance  Patient tolerated treatment well    Behavior During Therapy  [facility] for tasks assessed/performed       Past Medical History:  Diagnosis Date  . Allergy   . Anxiety   . Atrial mass    right  . Constipation   . Depression   . Diabetes mellitus   . Hyperlipidemia   . Hypertension   . Osteoporosis   . Seizures (HCC)     Past Surgical History:  Procedure Laterality Date  . DILATION AND CURETTAGE OF UTERUS  1987    There were no vitals filed for this visit.   Subjective Assessment - 01/04/20 1238    Subjective  Pt presents to PT with complaints of bil LE pain and weakness of a chronic nature.  Pt has responded well to massage and gets a massage monthly.    Pertinent History  osteoporosis, TIA, seizures, diabetes, depression    Limitations  Standing;Walking    How long can you stand comfortably?  45 minutes    How long can you walk comfortably?  45 minutes    Patient Stated Goals  reduce leg pain, improve endurance and abiltiy to stand and walk    Currently in Pain?  Yes    Pain Score  0-No pain   general "fatigue"- <3/10   Pain Orientation  Left;Right    Pain Descriptors / Indicators  Heaviness;Aching;Sore    Pain Onset  More than a month ago    Pain Frequency  Intermittent    Aggravating Factors   standing and walking, steps    Pain Relieving Factors  rest, massage         [facility] PT  Assessment - 01/04/20 0001      Assessment   Medical Diagnosis  pain in both lower extremities    Referring Provider (PT)  Jacinta Shoe, MD    Onset Date/Surgical Date  01/04/15    Next MD Visit  3 months       Precautions   Precautions  Other (comment)    Precaution Comments  seizure disorder      Restrictions   Weight Bearing Restrictions  No      Balance Screen   Has the patient fallen in the past 6 months  No    Has the patient had a decrease in activity level because of a fear of falling?   No    Is the patient reluctant to leave their home because of a fear of falling?   No      Home Environment   Living Environment  Private residence    Living Arrangements  Alone    Type of Home  House    Home Access  Stairs to enter    Home Layout  Two level      Prior Function   Level  of Independence  Independent    Vocation  Retired    Leisure  walking, read, housework      Cognition   Overall Cognitive Status  Within Functional Limits for tasks assessed      Posture/Postural Control   Posture/Postural Control  No significant limitations      ROM / Strength   AROM / PROM / Strength  AROM;PROM;Strength      AROM   Overall AROM   Within functional limits for tasks performed      PROM   Overall PROM   Within functional limits for tasks performed      Strength   Overall Strength  Deficits    Overall Strength Comments  bil hips 4/5, knees 4+/5, ankles 4/5      Palpation   Palpation comment  no palpable tenderness       Transfers   Transfers  Sit to Stand;Stand to Sit    Sit to Stand  7: Independent    Five time sit to stand comments   12.97 seconds    Stand to Sit  7: Independent      Ambulation/Gait   Ambulation/Gait  Yes    Gait Pattern  Step-through pattern;Decreased step length - left;Decreased step length - right    Ambulation Surface  Level                Objective measurements completed on examination: See above findings.               PT Education - 01/04/20 1305    Education Details  Access Code: [email or web address]    Person(s) Educated  Patient    Methods  Explanation;Demonstration;Handout    Comprehension  Verbalized understanding;Returned demonstration       PT Short Term Goals - 01/04/20 1233      PT SHORT TERM GOAL #1   Title  be independent in initial HEP    Time  4    Period  Weeks    Status  New    Target Date  02/01/20      PT SHORT TERM GOAL #2   Title  report a 30% reduction in LE pain after standing and walking    Time  4    Period  Weeks    Status  New    Target Date  02/01/20        PT Long Term Goals - 01/04/20 1318      PT LONG TERM GOAL #1   Title  be independent in advanced HEP    Time  8    Period  Weeks    Status  New    Target Date  02/29/20      PT LONG TERM GOAL #2   Title  report 50% less LE fatigue and pain after standing and walking    Time  8    Period  Weeks    Status  New    Target Date  02/29/20      PT LONG TERM GOAL #3   Title  Pt will demo improved single leg stability evident by her ability to maintain SLS up to 10 sec on the Lt and Rt without LOB, 2/3 trials.     Time  8    Period  Weeks    Status  New    Target Date  02/29/20      PT LONG TERM GOAL #4   Title  Pt will report atleast 75% improvement in  her steadiness with activity at home and in the community.    Time  8    Period  Weeks    Status  New    Target Date  02/29/20             Plan - 01/04/20 1326    Clinical Impression Statement  Pt is a 86 y.o. female and presents to PT with complaints of bil LE weakness and fatigue overall after standing and walking.  Pt is getting monthly massages and feels this really helps her pain and discomfort.  Pt demonstrates reduced step length with gait and instability with change of directions.  Pt performed 5x sit to stand in 12.97 seconds and SLS 5 seconds on the Rt and 3 seconds on the Lt.  Pt with 4/5 bil hip strength and 4+/5 bil  knee strength.  Pt will have limited attendance due to financial concerns.  PT advised a massage gun for home to perform soft tissue mobilization.  Pt will benefit from skilled PT to improve LE functional strength, flexibility and tissue mobility with manual therapy.    Personal Factors and Comorbidities  Comorbidity 2    Comorbidities  TIA, seizures    Examination-Activity Limitations  Stand;Squat;Stairs;Locomotion Level    Examination-Participation Restrictions  Community Activity;Yard Work;Cleaning    Stability/Clinical Decision Making  Stable/Uncomplicated    Clinical Decision Making  Low    Rehab Potential  Good    PT Frequency  Biweekly    PT Duration  8 weeks    PT Treatment/Interventions  ADLs/Self Care Home Management;Cryotherapy;Electrical Stimulation;Moist Heat;Gait training;Stair training;Functional mobility training;Therapeutic activities;Therapeutic exercise;Balance training;Neuromuscular re-education;Manual techniques;Patient/family education;Passive range of motion;Taping    PT Next Visit Plan  review HEP and add to it (only coming every other week), balance exercises, manual therapy to bil legs (below knee is where her pain is)    PT Home Exercise Plan  Access Code: [email or web address]    Consulted and Agree with Plan of Care  Patient       Patient will benefit from skilled therapeutic intervention in order to improve the following deficits and impairments:  Abnormal gait, Decreased activity tolerance, Decreased strength, Improper body mechanics, Impaired flexibility, Pain, Decreased endurance, Decreased range of motion  Visit Diagnosis: Pain in left leg - Plan: PT plan of care cert/re-cert  Pain in right leg - Plan: PT plan of care cert/re-cert  Muscle weakness (generalized) - Plan: PT plan of care cert/re-cert     Problem List Patient Active Problem List   Diagnosis Date Noted  . Plantar fasciitis 05/18/2019  . Paresthesias 02/22/2019  . Partial seizure ([place]) 07/05/2018  .  SAH (subarachnoid hemorrhage) ([place]) 04/22/2018  . TIA (transient ischemic attack) 04/15/2018  . Hypokalemia 03/02/2018  . Fatigue 02/13/2018  . Acute kidney injury ([place]) 02/12/2018  . Hyponatremia 02/12/2018  . Aortic atherosclerosis ([place]) 08/11/2017  . Carotid bruit 08/11/2017  . Urinary tract infection without hematuria 05/28/2017  . Vaginitis 05/28/2017  . HTN (hypertension) 04/04/2017  . Female bladder prolapse 04/01/2017  . Dysuria 04/22/2016  . Dry skin dermatitis 09/04/2015  . Cerumen impaction 09/04/2015  . Cataract 02/01/2015  . Burn of hand, left 06/22/2014  . Leg pain 07/14/2013  . Atrial mass 03/13/2013  . Tricuspid regurgitation   . Well adult exam 01/18/2013  . Nonspecific abnormal electrocardiogram (ECG) (EKG) 01/18/2013  . Heart murmur 01/18/2013  . Superficial phlebitis 10/21/2012  . Zoster 10/21/2012  . Hives 02/23/2012  . Arthralgia 09/01/2011  . Foot pain, right  04/22/2011  . CHANGE IN BOWELS 06/17/2010  . BLADDER PROLAPSE 01/28/2010  . Cough 01/28/2010  . ABDOMINAL BLOATING 07/12/2009  . TOBACCO USE, QUIT 07/12/2009  . ECZEMA 10/31/2008  . Dyslipidemia 06/28/2008  . Constipation 04/20/2008  . BLEPHARITIS 11/23/2007  . Actinic keratosis 09/05/2007  . Diabetic on diet only (HCC) 08/01/2007  . Anxiety state 06/29/2007  . Depression 06/29/2007  . Hypertensive renal disease 06/29/2007  . ALLERGIC RHINITIS 06/29/2007  . OSTEOPOROSIS 06/29/2007     Lorrene Reid, PT 01/04/20 1:35 PM  [facility] [address] [place], [place], [address] Phone: [phone]   Fax:  [phone]  Name: ANYELINA CLAYCOMB MRN: [id number] Date of Birth: [birth year]

## 2020-01-16 NOTE — Progress Notes (Signed)
Cardiology Office Note   Date:  01/22/2020   ID:  Vickie Wilson 1930-12-30, MRN [id number]  PCP:  Cassandria Anger, MD  Cardiologist:  Greysen Swanton [place], MD   Chief Complaint  Patient presents with  . New Patient (Initial Visit)      History of Present Illness: Vickie Wilson is a 86 y.o. female who is seen at the request of Dr Alain Marion for cardiac evaluation.  Patient was last seen in 2014. At that time she had a right atrial mass. This was diagnosed incidentally on an echocardiogram. This was confirmed by MRI. She was seen in consultation by Dr. Roxy Manns and surgical resection of the tumor was recommended. The patient declined. Repeat Echo in July 2015 showed no evidence of the right atrial mass. She also had an Echo in August 2019 that was normal.   She was admitted in August 2019. Noted to have a very small SAH. Also had an incidental 2 mm aneurysm. Was ultimately diagnosed with simple partial seizures and placed on Keppra.  She really has no cardiac concerns today. She just really wanted her heart checked out. Denies any chest pain, dyspnea, palpitations, dizziness, syncope, or edema.    Past Medical History:  Diagnosis Date  . Allergy   . Anxiety   . Atrial mass    right  . Constipation   . Depression   . Diabetes mellitus   . Hyperlipidemia   . Hypertension   . Osteoporosis   . Seizures ([place])     Past Surgical History:  Procedure Laterality Date  . DILATION AND CURETTAGE OF UTERUS  1987     Current Outpatient Medications  Medication Sig Dispense Refill  . amLODipine (NORVASC) 5 MG tablet TAKE 1 TABLET ONCE DAILY. 90 tablet 3  . atenolol (TENORMIN) 100 MG tablet TAKE (1/2) TABLET TWICE DAILY. 90 tablet 3  . Cholecalciferol (VITAMIN D3) 50 MCG (2000 UT) capsule Take 1 capsule (2,000 Units total) by mouth daily. 100 capsule 3  . levETIRAcetam (KEPPRA) 750 MG tablet Take 1 tablet (750 mg total) by mouth 2 (two) times daily. 180 tablet 3  . Multiple  Vitamins-Minerals (CENTRUM SILVER) CHEW Chew 1 each by mouth 3 (three) times a week.     Marland Kitchen PARoxetine (PAXIL) 10 MG tablet TAKE 1 TABLET IN THE MORNING. 90 tablet 1  . rosuvastatin (CRESTOR) 20 MG tablet 0.5 tablet M-W-F 45 tablet 3   No current facility-administered medications for this visit.    Allergies:   Captopril    Social History:  The patient  reports that she quit smoking about 21 years ago. She has a 20.00 pack-year smoking history. She has never used smokeless tobacco. She reports that she does not drink alcohol or use drugs.   Family History:  The patient's family history includes Alcohol abuse in her father; Heart disease in her father and mother; Hyperlipidemia in an other family member; Hypertension in an other family member; Stroke in an other family member.    ROS:  Please see the history of present illness.   Otherwise, review of systems are positive for none.   All other systems are reviewed and negative.    PHYSICAL EXAM: VS:  BP 130/78 (BP Location: Left Arm, Patient Position: Sitting, Cuff Size: Normal)   Pulse (!) 54   Temp (!) 97.1 F (36.2 C)   Ht 5\' 1"  (1.549 m)   Wt 111 lb (50.3 kg)   BMI 20.97 kg/m  , BMI  Body mass index is 20.97 kg/m. GEN: Well nourished, well developed, in no acute distress  HEENT: normal  Neck: no JVD, carotid bruits, or masses Cardiac: RRR; no murmurs, rubs, or gallops,no edema  Respiratory:  clear to auscultation bilaterally, normal work of breathing GI: soft, nontender, nondistended, + BS MS: no deformity or atrophy  Skin: warm and dry, no rash Neuro:  Strength and sensation are intact Psych: euthymic mood, full affect   EKG:  EKG is ordered today. The ekg ordered today demonstrates NSR with possible old anterior infarct. Unchanged from prior. I have personally reviewed and interpreted this study.    Recent Labs: 02/22/2019: Hemoglobin 12.5; Platelets 324.0; TSH 1.93 05/18/2019: ALT 10 08/22/2019: BUN 16; Creatinine, Ser  1.08; Potassium 4.4; Sodium 136    Lipid Panel    Component Value Date/Time   CHOL 205 (H) 05/18/2019 1313   TRIG 127.0 05/18/2019 1313   HDL 54.30 05/18/2019 1313   CHOLHDL 4 05/18/2019 1313   VLDL 25.4 05/18/2019 1313   LDLCALC 125 (H) 05/18/2019 1313   LDLDIRECT 125.6 01/16/2013 0849      Wt Readings from Last 3 Encounters:  01/22/20 111 lb (50.3 kg)  12/18/19 115 lb 2 oz (52.2 kg)  08/17/19 115 lb (52.2 kg)      Other studies Reviewed: Additional studies/ records that were reviewed today include: hospital records from 2019.   ASSESSMENT AND PLAN:  1. HTN well controlled 2. History of incidental right atrial mass on Echo that subsequently resolved without therapy. Last Echo in 2019 was OK 3. Simple partial seizures. Followed by Neuro.  Patient reassured that she is doing well from a cardiac standpoint and no further work up needed.   Current medicines are reviewed at length with the patient today.  The patient does not have concerns regarding medicines.  The following changes have been made:  no change  Labs/ tests ordered today include:  No orders of the defined types were placed in this encounter.    Disposition:   FU with me PRN  Signed, Zaryan Yakubov [place], MD  01/22/2020 2:02 PM    [facility] [address] [place], [place], [address] Phone [phone], Fax [phone]

## 2020-01-22 ENCOUNTER — Other Ambulatory Visit: Payer: Self-pay

## 2020-01-22 ENCOUNTER — Ambulatory Visit (INDEPENDENT_AMBULATORY_CARE_PROVIDER_SITE_OTHER): Payer: Medicare Other | Admitting: Cardiology

## 2020-01-22 ENCOUNTER — Encounter: Payer: Self-pay | Admitting: Cardiology

## 2020-01-22 VITALS — BP 130/78 | HR 54 | Temp 97.1°F | Ht 61.0 in | Wt 111.0 lb

## 2020-01-22 DIAGNOSIS — I10 Essential (primary) hypertension: Secondary | ICD-10-CM | POA: Diagnosis not present

## 2020-01-29 ENCOUNTER — Ambulatory Visit: Payer: Medicare Other | Attending: Internal Medicine | Admitting: Physical Therapy

## 2020-01-29 ENCOUNTER — Other Ambulatory Visit: Payer: Self-pay

## 2020-01-29 DIAGNOSIS — R2681 Unsteadiness on feet: Secondary | ICD-10-CM

## 2020-01-29 DIAGNOSIS — M79605 Pain in left leg: Secondary | ICD-10-CM | POA: Diagnosis not present

## 2020-01-29 DIAGNOSIS — M6281 Muscle weakness (generalized): Secondary | ICD-10-CM | POA: Insufficient documentation

## 2020-01-29 DIAGNOSIS — M79604 Pain in right leg: Secondary | ICD-10-CM | POA: Diagnosis not present

## 2020-01-29 NOTE — Therapy (Addendum)
[facility] [address], [place], [place], [address] Phone: [phone]   Fax:  [phone]  Physical Therapy Treatment/Discharge Summary  Patient Details  Name: Vickie Wilson MRN: [id number] Date of Birth: Apr 04, 1931 Referring Provider (PT): Lew Dawes, MD   Encounter Date: 01/29/2020  PT End of Session - 01/29/20 1115    Visit Number  2    Date for PT Re-Evaluation  02/29/20    Authorization Type  [employer] Medicare    PT Start Time  0933    PT Stop Time  1015    PT Time Calculation (min)  42 min    Activity Tolerance  Patient tolerated treatment well       Past Medical History:  Diagnosis Date  . Allergy   . Anxiety   . Atrial mass    right  . Constipation   . Depression   . Diabetes mellitus   . Hyperlipidemia   . Hypertension   . Osteoporosis   . Seizures ([place])     Past Surgical History:  Procedure Laterality Date  . DILATION AND CURETTAGE OF UTERUS  1987    There were no vitals filed for this visit.  Subjective Assessment - 01/29/20 0933    Subjective  I'm the same.  I saw my heart doctor and he says I'm doing great.  The exercises are simple.  Massage really helps but I can't afford that.  I can't come here very much b/c of the cost.    Pertinent History  osteoporosis, TIA, seizures, diabetes, depression    Patient Stated Goals  reduce leg pain, improve endurance and abiltiy to stand and walk    Currently in Pain?  No/denies    Pain Score  0-No pain    Pain Orientation  Right;Left;Lower                        [facility] Adult PT Treatment/Exercise - 01/29/20 0001      Self-Care   Other Self-Care Comments   discussed massage rollers different types and where to buy online and locally      Lumbar Exercises: Seated   Sit to Stand  10 reps    Sit to Stand Limitations  no UEs     Other Seated Lumbar Exercises  foam roll transverse abdominus activation with foam roll push  down, back of a chair or front thigh pushes 5x each       Knee/Hip Exercises: Seated   Other Seated Knee/Hip Exercises  seated HS stretch 3x 20 sec right/left       Ankle Exercises: Stretches   Plantar Fascia Stretch Limitations  in doorway 10x right/left     Gastroc Stretch Limitations  leaning on the back of a chair lunge forward 10x right/left       Ankle Exercises: Standing   Heel Raises  Both;10 reps    Other Standing Ankle Exercises  wall pushups 10x       Ankle Exercises: Seated   Other Seated Ankle Exercises  LAQ from initial HEP             PT Education - 01/29/20 1114    Education Details  Access Code: [email or web address]  doorway stretches, sit to stand, wall push ups, seated core muscle activation; home massage roller options    Person(s) Educated  Patient    Methods  Explanation;Demonstration;Handout    Comprehension  Returned demonstration;Verbalized understanding  PT Short Term Goals - 01/04/20 1233      PT SHORT TERM GOAL #1   Title  be independent in initial HEP    Time  4    Period  Weeks    Status  New    Target Date  02/01/20      PT SHORT TERM GOAL #2   Title  report a 30% reduction in LE pain after standing and walking    Time  4    Period  Weeks    Status  New    Target Date  02/01/20        PT Long Term Goals - 01/04/20 1318      PT LONG TERM GOAL #1   Title  be independent in advanced HEP    Time  8    Period  Weeks    Status  New    Target Date  02/29/20      PT LONG TERM GOAL #2   Title  report 50% less LE fatigue and pain after standing and walking    Time  8    Period  Weeks    Status  New    Target Date  02/29/20      PT LONG TERM GOAL #3   Title  Pt will demo improved single leg stability evident by her ability to maintain SLS up to 10 sec on the Lt and Rt without LOB, 2/3 trials.     Time  8    Period  Weeks    Status  New    Target Date  02/29/20      PT LONG TERM GOAL #4   Title  Pt will report atleast 75%  improvement in her steadiness with activity at home and in the community.    Time  8    Period  Weeks    Status  New    Target Date  02/29/20            Plan - 01/29/20 0946    Clinical Impression Statement  The patient returns > 3 weeks after initial evaluation.  She states she is unable to come more often secondary to financial concerns.  We discussed home massage roller options for self care at home.  She is able to demonstrate her previous HEP with moderate cues for hold times and best technique.  She was instructed in additional ex's for soft tissue lengthening and functional strengthening although she needs moderate cues to avoid compensatory strategies.  We discussed follow up in 4 weeks however she does not wish to schedule an appt today requesting to "wait and see how I do with these ex's."    Comorbidities  TIA, seizures    Examination-Activity Limitations  Stand;Squat;Stairs;Locomotion Level    Examination-Participation Restrictions  Community Activity;Yard Work;Cleaning    Rehab Potential  Good    PT Frequency  Biweekly    PT Duration  8 weeks    PT Treatment/Interventions  ADLs/Self Care Home Management;Cryotherapy;Electrical Stimulation;Moist Heat;Gait training;Stair training;Functional mobility training;Therapeutic activities;Therapeutic exercise;Balance training;Neuromuscular re-education;Manual techniques;Patient/family education;Passive range of motion;Taping    PT Next Visit Plan  review HEP and progress;  balance exercises    PT Home Exercise Plan  Access Code: [facility]       Patient will benefit from skilled therapeutic intervention in order to improve the following deficits and impairments:  Abnormal gait, Decreased activity tolerance, Decreased strength, Improper body mechanics, Impaired flexibility, Pain, Decreased endurance, Decreased range of motion  Visit Diagnosis: Pain in left leg  Pain in right leg  Muscle weakness (generalized)  Unsteadiness on  feet    PHYSICAL THERAPY DISCHARGE SUMMARY  Visits from Start of Care: 2  Current functional level related to goals / functional outcomes: The patient did not make any follow up visits at her last appointment and has not returned in > 2 months.  Will discharge from PT at this time.    Remaining deficits: As above   Education / Equipment: Basic HEP  Plan:                                                    Patient goals were not met. Patient is being discharged due to not returning since the last visit.  ?????      Problem List Patient Active Problem List   Diagnosis Date Noted  . Plantar fasciitis 05/18/2019  . Paresthesias 02/22/2019  . Partial seizure ([place]) 07/05/2018  . SAH (subarachnoid hemorrhage) ([place]) 04/22/2018  . TIA (transient ischemic attack) 04/15/2018  . Hypokalemia 03/02/2018  . Fatigue 02/13/2018  . Acute kidney injury ([place]) 02/12/2018  . Hyponatremia 02/12/2018  . Aortic atherosclerosis ([place]) 08/11/2017  . Carotid bruit 08/11/2017  . Urinary tract infection without hematuria 05/28/2017  . Vaginitis 05/28/2017  . HTN (hypertension) 04/04/2017  . Female bladder prolapse 04/01/2017  . Dysuria 04/22/2016  . Dry skin dermatitis 09/04/2015  . Cerumen impaction 09/04/2015  . Cataract 02/01/2015  . Burn of hand, left 06/22/2014  . Leg pain 07/14/2013  . Atrial mass 03/13/2013  . Tricuspid regurgitation   . Well adult exam 01/18/2013  . Nonspecific abnormal electrocardiogram (ECG) (EKG) 01/18/2013  . Heart murmur 01/18/2013  . Superficial phlebitis 10/21/2012  . Zoster 10/21/2012  . Hives 02/23/2012  . Arthralgia 09/01/2011  . Foot pain, right 04/22/2011  . CHANGE IN BOWELS 06/17/2010  . BLADDER PROLAPSE 01/28/2010  . Cough 01/28/2010  . ABDOMINAL BLOATING 07/12/2009  . TOBACCO USE, QUIT 07/12/2009  . ECZEMA 10/31/2008  . Dyslipidemia 06/28/2008  . Constipation 04/20/2008  . BLEPHARITIS 11/23/2007  . Actinic keratosis 09/05/2007  . Diabetic on  diet only ([facility]) 08/01/2007  . Anxiety state 06/29/2007  . Depression 06/29/2007  . Hypertensive renal disease 06/29/2007  . ALLERGIC RHINITIS 06/29/2007  . OSTEOPOROSIS 06/29/2007   Ruben Im, PT 01/29/20 11:23 AM Phone: [phone] Fax: [phone]  Alvera Singh 01/29/2020, 11:22 AM  [facility] [address] [place], [place], [address] Phone: [phone]   Fax:  [phone]  Name: Vickie Wilson MRN: [id number] Date of Birth: Sep 12, 1931

## 2020-01-29 NOTE — Patient Instructions (Addendum)
[employer] Muscle Roller Massage Stick Tool for Athletes, 18 Inches Muscle Roller for Relieving Muscle Soreness, Soothing Cramps, Massage, Physical Therapy & Body Recovery Black Visit the [employer] Store 4.6 out of 5 stars    2,358 ratings  4 answered questions Amazon's Choice for "roller massager for legs" Price: $8.99 Get Fast, Free Shipping with [employer]      Access Code: [email or web address] URL: [email or web address] Date: 01/29/2020 Prepared by: Lavinia Sharps  Exercises Standing Heel Raise with Support - 2 x daily - 7 x weekly - 2 sets - 10 reps Seated Hamstring Stretch - 3 x daily - 7 x weekly - 1 sets - 3 reps - 20 hold Seated Long Arc Quad - 3 x daily - 7 x weekly - 2 sets - 10 reps - 5 hold Standing Gastroc Stretch - 1 x daily - 7 x weekly - 1 sets - 10 reps Doorway Pec Stretch at 90 Degrees Abduction - 1 x daily - 7 x weekly - 1 sets - 10 reps Seated Calf Stretch with Strap - 1 x daily - 7 x weekly - 10 reps - 3 sets Seated Transversus Abdominis Bracing - 1 x daily - 7 x weekly - 1 sets - 10 reps Sit to Stand - 1 x daily - 7 x weekly - 1 sets - 10 reps Wall Push Up - 1 x daily - 7 x weekly - 1 sets - 10 reps

## 2020-03-20 ENCOUNTER — Ambulatory Visit (INDEPENDENT_AMBULATORY_CARE_PROVIDER_SITE_OTHER): Payer: Medicare Other | Admitting: Internal Medicine

## 2020-03-20 ENCOUNTER — Encounter: Payer: Self-pay | Admitting: Internal Medicine

## 2020-03-20 ENCOUNTER — Other Ambulatory Visit: Payer: Self-pay

## 2020-03-20 DIAGNOSIS — G459 Transient cerebral ischemic attack, unspecified: Secondary | ICD-10-CM

## 2020-03-20 DIAGNOSIS — R569 Unspecified convulsions: Secondary | ICD-10-CM

## 2020-03-20 DIAGNOSIS — I10 Essential (primary) hypertension: Secondary | ICD-10-CM

## 2020-03-20 DIAGNOSIS — F3342 Major depressive disorder, recurrent, in full remission: Secondary | ICD-10-CM

## 2020-03-20 DIAGNOSIS — E119 Type 2 diabetes mellitus without complications: Secondary | ICD-10-CM

## 2020-03-20 DIAGNOSIS — R5383 Other fatigue: Secondary | ICD-10-CM | POA: Diagnosis not present

## 2020-03-20 DIAGNOSIS — M2550 Pain in unspecified joint: Secondary | ICD-10-CM

## 2020-03-20 MED ORDER — ACETAMINOPHEN ER 650 MG PO TBCR: 650.0000 mg | EXTENDED_RELEASE_TABLET | Freq: Three times a day (TID) | ORAL | 3 refills | Status: DC | PRN

## 2020-03-20 NOTE — Assessment & Plan Note (Signed)
Norvasc, Atenolol

## 2020-03-20 NOTE — Assessment & Plan Note (Signed)
No relapse 

## 2020-03-20 NOTE — Assessment & Plan Note (Signed)
Labs

## 2020-03-20 NOTE — Progress Notes (Signed)
Subjective:  Patient ID: Vickie Wilson, female    DOB: [birth year]  Age: 86 y.o. MRN: [id number]  CC: Follow-up   HPI Vickie Wilson presents for HTN, TIA, anxiety f/u C/o arthralgias  - worse  Outpatient Medications Prior to Visit  Medication Sig Dispense Refill  . amLODipine (NORVASC) 5 MG tablet TAKE 1 TABLET ONCE DAILY. 90 tablet 3  . atenolol (TENORMIN) 100 MG tablet TAKE (1/2) TABLET TWICE DAILY. 90 tablet 3  . Cholecalciferol (VITAMIN D3) 50 MCG (2000 UT) capsule Take 1 capsule (2,000 Units total) by mouth daily. 100 capsule 3  . levETIRAcetam (KEPPRA) 750 MG tablet Take 1 tablet (750 mg total) by mouth 2 (two) times daily. 180 tablet 3  . Multiple Vitamins-Minerals (CENTRUM SILVER) CHEW Chew 1 each by mouth 3 (three) times a week.     Marland Kitchen PARoxetine (PAXIL) 10 MG tablet TAKE 1 TABLET IN THE MORNING. 90 tablet 1  . rosuvastatin (CRESTOR) 20 MG tablet 0.5 tablet M-W-F 45 tablet 3   No facility-administered medications prior to visit.    ROS: Review of Systems  Constitutional: Positive for fatigue. Negative for activity change, appetite change, chills and unexpected weight change.  HENT: Negative for congestion, mouth sores and sinus pressure.   Eyes: Negative for visual disturbance.  Respiratory: Negative for cough and chest tightness.   Gastrointestinal: Negative for abdominal pain and nausea.  Genitourinary: Negative for difficulty urinating, frequency and vaginal pain.  Musculoskeletal: Positive for arthralgias. Negative for back pain and gait problem.  Skin: Negative for pallor and rash.  Neurological: Negative for dizziness, tremors, weakness, numbness and headaches.  Psychiatric/Behavioral: Negative for confusion and sleep disturbance. The patient is nervous/anxious.     Objective:  BP 134/84   Pulse 68   Temp 98 F (36.7 C) (Oral)   Ht 5\' 1"  (1.549 m)   Wt 111 lb (50.3 kg)   SpO2 95%   BMI 20.97 kg/m   BP Readings from Last 3 Encounters:  03/20/20 134/84    01/22/20 130/78  12/18/19 136/80    Wt Readings from Last 3 Encounters:  03/20/20 111 lb (50.3 kg)  01/22/20 111 lb (50.3 kg)  12/18/19 115 lb 2 oz (52.2 kg)    Physical Exam Constitutional:      General: She is not in acute distress.    Appearance: She is well-developed.  HENT:     Head: Normocephalic.     Right Ear: External ear normal.     Left Ear: External ear normal.     Nose: Nose normal.  Eyes:     General:        Right eye: No discharge.        Left eye: No discharge.     Conjunctiva/sclera: Conjunctivae normal.     Pupils: Pupils are equal, round, and reactive to light.  Neck:     Thyroid: No thyromegaly.     Vascular: No JVD.     Trachea: No tracheal deviation.  Cardiovascular:     Rate and Rhythm: Normal rate and regular rhythm.     Heart sounds: Normal heart sounds.  Pulmonary:     Effort: No respiratory distress.     Breath sounds: No stridor. No wheezing.  Abdominal:     General: Bowel sounds are normal. There is no distension.     Palpations: Abdomen is soft. There is no mass.     Tenderness: There is no abdominal tenderness. There is no guarding or rebound.  Musculoskeletal:  General: Tenderness present.     Cervical back: Normal range of motion and neck supple.  Lymphadenopathy:     Cervical: No cervical adenopathy.  Skin:    Findings: No erythema or rash.  Neurological:     Cranial Nerves: No cranial nerve deficit.     Motor: No abnormal muscle tone.     Coordination: Coordination normal.     Deep Tendon Reflexes: Reflexes normal.  Psychiatric:        Behavior: Behavior normal.        Thought Content: Thought content normal.        Judgment: Judgment normal.    Sensitive joints  Lab Results  Component Value Date   WBC 7.8 02/22/2019   HGB 12.5 02/22/2019   HCT 38.0 02/22/2019   PLT 324.0 02/22/2019   GLUCOSE 96 08/22/2019   CHOL 205 (H) 05/18/2019   TRIG 127.0 05/18/2019   HDL 54.30 05/18/2019   LDLDIRECT 125.6 01/16/2013    LDLCALC 125 (H) 05/18/2019   ALT 10 05/18/2019   AST 22 05/18/2019   NA 136 08/22/2019   K 4.4 08/22/2019   CL 101 08/22/2019   CREATININE 1.08 08/22/2019   BUN 16 08/22/2019   CO2 27 08/22/2019   TSH 1.93 02/22/2019   INR 0.92 04/22/2018   HGBA1C 6.1 08/22/2019   MICROALBUR 2.8 (H) 09/17/2016    CT ANGIO HEAD W OR WO CONTRAST  Result Date: 04/23/2018 CLINICAL DATA:  Subarachnoid hemorrhage, proven, follow-up. EXAM: CT ANGIOGRAPHY HEAD AND NECK TECHNIQUE: Multidetector CT imaging of the head and neck was performed using the standard protocol during bolus administration of intravenous contrast. Multiplanar CT image reconstructions and MIPs were obtained to evaluate the vascular anatomy. Carotid stenosis measurements (when applicable) are obtained utilizing NASCET criteria, using the distal internal carotid diameter as the denominator. CONTRAST:  50mL ISOVUE-370 IOPAMIDOL (ISOVUE-370) INJECTION 76% COMPARISON:  The of conservatively or FINDINGS: CT HEAD FINDINGS Brain: Subarachnoid hemorrhage within the right middle frontal sulcus is stable. No new areas of hemorrhage are present. Atrophy and white matter disease is noted bilaterally. No mass lesion is present. No significant cortical infarct is present. Vascular: Atherosclerotic calcifications are present within the cavernous internal carotid arteries. There is no hyperdense vessel. Skull: Calvarium is intact. No focal lytic or blastic lesions are present. Asymmetric degenerative changes are present at the right TMJ. Sinuses: The paranasal sinuses and mastoid air cells are clear. Orbits: Left lens replacement is present. Globes and orbits are otherwise within normal limits. Review of the MIP images confirms the above findings CTA NECK FINDINGS Aortic arch: A 3 vessel arch configuration is present. No significant stenosis or aneurysm is present. Right carotid system: The right common carotid artery is within normal limits. Dense atherosclerotic  calcifications are present at the bifurcation without a significant stenosis relative to the more distal vessel. Left carotid system: The left common carotid artery is within normal limits. Atherosclerotic calcifications are present at the bifurcation without a significant luminal stenosis relative to the more distal vessel. Vertebral arteries: The vertebral arteries are codominant. Both vertebral arteries originate from the subclavian arteries without a significant stenosis. There is some tortuosity of the V1 segments bilaterally. No significant stenosis or vessel injury is present to either vertebral artery in the neck. Skeleton: Degenerative anterolisthesis is present at C2-3, C3-4, and C4-5. There is chronic loss of disc height with endplate sclerosis at C5-6 and C6-7. Marked lucency surrounds the roots of the residual right maxillary molar. Dental caries are present  without periapical disease. Other neck: The soft tissues the neck are otherwise unremarkable. No significant adenopathy is present. No mucosal lesions are evident. Salivary glands are within normal limits bilaterally. Thyroid is normal. Upper chest: Centrilobular emphysematous changes are present bilaterally. No focal nodule or mass lesion is present. Review of the MIP images confirms the above findings CTA HEAD FINDINGS Anterior circulation: Atherosclerotic calcifications are present within the cavernous internal carotid arteries bilaterally. There is no hyperdense vessel. 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 No definite posterior communicating artery is present. The A1 and M1 segments are normal. The anterior communicating artery is patent. MCA bifurcations are within normal limits bilaterally. The ACA and MCA branch vessels are normal. No focal vascular lesion is evident at the location of the subarachnoid hemorrhage over the right frontal convexity. Posterior circulation: Vertebral arteries are within  normal limits bilaterally. PICA origins are visualized and normal. The basilar artery is normal. Both posterior cerebral arteries originate from the basilar tip. PCA branch vessels are within normal limits bilaterally. Venous sinuses: The dural sinuses are patent. Straight sinus and deep cerebral veins are intact. Cortical veins are normal. Anatomic variants: None Delayed phase: Delayed images demonstrate no pathologic enhancement. White matter disease is accentuated. Review of the MIP images confirms the above findings IMPRESSION: 1. No acute or focal vascular lesion to explain subarachnoid hemorrhage over the convexity of the right frontal lobe. 2. No mass lesion. 3. Stable atrophy and white matter disease. 4. 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 Recommend follow-up CTA of the head in 1 year to assess stability. This aneurysm does not correspond with the area of subarachnoid hemorrhage. Electronically Signed   By: Marin Roberts M.D.   On: 04/23/2018 19:28   DG Chest 2 View  Result Date: 04/22/2018 CLINICAL DATA:  Intermittent LEFT arm numbness for 2 weeks, history hypertension, diabetes mellitus, anxiety, former smoker EXAM: CHEST - 2 VIEW COMPARISON:  02/12/2018 FINDINGS: Normal heart size, mediastinal contours, and pulmonary vascularity. Atherosclerotic calcification aorta. Lungs hyperinflated with minimal subsegmental atelectasis at LEFT costophrenic angle. Remaining lungs clear. No acute infiltrate, pleural effusion or pneumothorax. Bones appear demineralized with old healed fracture of the posterolateral RIGHT sixth rib. IMPRESSION: Hyperinflated lungs with minimal subsegmental atelectasis at LEFT costophrenic angle. Electronically Signed   By: Ulyses Southward M.D.   On: 04/22/2018 11:07   CT HEAD WO CONTRAST  Result Date: 04/22/2018 CLINICAL DATA:  Left arm numbness. EXAM: CT HEAD WITHOUT CONTRAST CT CERVICAL SPINE WITHOUT CONTRAST TECHNIQUE: Multidetector  CT imaging of the head and cervical spine was performed following the standard protocol without intravenous contrast. Multiplanar CT image reconstructions of the cervical spine were also generated. COMPARISON:  None. FINDINGS: CT HEAD FINDINGS Brain: Mild diffuse cortical atrophy is noted. Mild chronic ischemic white matter disease is noted. No mass effect or midline shift is noted. Ventricular size is within normal limits. There is no evidence of mass lesion, hemorrhage or acute infarction. Vascular: No hyperdense vessel or unexpected calcification. Skull: Normal. Negative for fracture or focal lesion. Sinuses/Orbits: No acute finding. Other: None. CT CERVICAL SPINE FINDINGS Alignment: Grade 1 anterolisthesis of C3-4 C4-5 is noted secondary to posterior facet joint hypertrophy. Skull base and vertebrae: No acute fracture. No primary bone lesion or focal pathologic process. Soft tissues and spinal canal: No prevertebral fluid or swelling. No visible canal hematoma. Disc levels: Severe degenerative disc disease is noted at C5-6 and C6-7 with anterior osteophyte  formation. Mild degenerative disc disease is noted at C4-5 and C7-T1. Upper chest: Negative. Other: Degenerative changes seen involving posterior facet joints bilaterally, right greater than left. IMPRESSION: Mild diffuse cortical atrophy. Mild chronic ischemic white matter disease. No acute intracranial abnormality seen. Multilevel degenerative disc disease is noted in the cervical spine. No fracture or other acute abnormality is noted. Electronically Signed   By: Lupita Raider, M.D.   On: 04/22/2018 11:09   CT ANGIO NECK W OR WO CONTRAST  Result Date: 04/23/2018 CLINICAL DATA:  Subarachnoid hemorrhage, proven, follow-up. EXAM: CT ANGIOGRAPHY HEAD AND NECK TECHNIQUE: Multidetector CT imaging of the head and neck was performed using the standard protocol during bolus administration of intravenous contrast. Multiplanar CT image reconstructions and MIPs  were obtained to evaluate the vascular anatomy. Carotid stenosis measurements (when applicable) are obtained utilizing NASCET criteria, using the distal internal carotid diameter as the denominator. CONTRAST:  50mL ISOVUE-370 IOPAMIDOL (ISOVUE-370) INJECTION 76% COMPARISON:  The of conservatively or FINDINGS: CT HEAD FINDINGS Brain: Subarachnoid hemorrhage within the right middle frontal sulcus is stable. No new areas of hemorrhage are present. Atrophy and white matter disease is noted bilaterally. No mass lesion is present. No significant cortical infarct is present. Vascular: Atherosclerotic calcifications are present within the cavernous internal carotid arteries. There is no hyperdense vessel. Skull: Calvarium is intact. No focal lytic or blastic lesions are present. Asymmetric degenerative changes are present at the right TMJ. Sinuses: The paranasal sinuses and mastoid air cells are clear. Orbits: Left lens replacement is present. Globes and orbits are otherwise within normal limits. Review of the MIP images confirms the above findings CTA NECK FINDINGS Aortic arch: A 3 vessel arch configuration is present. No significant stenosis or aneurysm is present. Right carotid system: The right common carotid artery is within normal limits. Dense atherosclerotic calcifications are present at the bifurcation without a significant stenosis relative to the more distal vessel. Left carotid system: The left common carotid artery is within normal limits. Atherosclerotic calcifications are present at the bifurcation without a significant luminal stenosis relative to the more distal vessel. Vertebral arteries: The vertebral arteries are codominant. Both vertebral arteries originate from the subclavian arteries without a significant stenosis. There is some tortuosity of the V1 segments bilaterally. No significant stenosis or vessel injury is present to either vertebral artery in the neck. Skeleton: Degenerative anterolisthesis  is present at C2-3, C3-4, and C4-5. There is chronic loss of disc height with endplate sclerosis at C5-6 and C6-7. Marked lucency surrounds the roots of the residual right maxillary molar. Dental caries are present without periapical disease. Other neck: The soft tissues the neck are otherwise unremarkable. No significant adenopathy is present. No mucosal lesions are evident. Salivary glands are within normal limits bilaterally. Thyroid is normal. Upper chest: Centrilobular emphysematous changes are present bilaterally. No focal nodule or mass lesion is present. Review of the MIP images confirms the above findings CTA HEAD FINDINGS Anterior circulation: Atherosclerotic calcifications are present within the cavernous internal carotid arteries bilaterally. There is no hyperdense vessel. 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 No definite posterior communicating artery is present. The A1 and M1 segments are normal. The anterior communicating artery is patent. MCA bifurcations are within normal limits bilaterally. The ACA and MCA branch vessels are normal. No focal vascular lesion is evident at the location of the subarachnoid hemorrhage over the right frontal convexity. Posterior circulation: Vertebral arteries are within normal limits bilaterally. PICA origins are visualized  and normal. The basilar artery is normal. Both posterior cerebral arteries originate from the basilar tip. PCA branch vessels are within normal limits bilaterally. Venous sinuses: The dural sinuses are patent. Straight sinus and deep cerebral veins are intact. Cortical veins are normal. Anatomic variants: None Delayed phase: Delayed images demonstrate no pathologic enhancement. White matter disease is accentuated. Review of the MIP images confirms the above findings IMPRESSION: 1. No acute or focal vascular lesion to explain subarachnoid hemorrhage over the convexity of the right frontal lobe. 2. No mass  lesion. 3. Stable atrophy and white matter disease. 4. 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 Recommend follow-up CTA of the head in 1 year to assess stability. This aneurysm does not correspond with the area of subarachnoid hemorrhage. Electronically Signed   By: Marin Roberts M.D.   On: 04/23/2018 19:28   CT Cervical Spine Wo Contrast  Result Date: 04/22/2018 CLINICAL DATA:  Left arm numbness. EXAM: CT HEAD WITHOUT CONTRAST CT CERVICAL SPINE WITHOUT CONTRAST TECHNIQUE: Multidetector CT imaging of the head and cervical spine was performed following the standard protocol without intravenous contrast. Multiplanar CT image reconstructions of the cervical spine were also generated. COMPARISON:  None. FINDINGS: CT HEAD FINDINGS Brain: Mild diffuse cortical atrophy is noted. Mild chronic ischemic white matter disease is noted. No mass effect or midline shift is noted. Ventricular size is within normal limits. There is no evidence of mass lesion, hemorrhage or acute infarction. Vascular: No hyperdense vessel or unexpected calcification. Skull: Normal. Negative for fracture or focal lesion. Sinuses/Orbits: No acute finding. Other: None. CT CERVICAL SPINE FINDINGS Alignment: Grade 1 anterolisthesis of C3-4 C4-5 is noted secondary to posterior facet joint hypertrophy. Skull base and vertebrae: No acute fracture. No primary bone lesion or focal pathologic process. Soft tissues and spinal canal: No prevertebral fluid or swelling. No visible canal hematoma. Disc levels: Severe degenerative disc disease is noted at C5-6 and C6-7 with anterior osteophyte formation. Mild degenerative disc disease is noted at C4-5 and C7-T1. Upper chest: Negative. Other: Degenerative changes seen involving posterior facet joints bilaterally, right greater than left. IMPRESSION: Mild diffuse cortical atrophy. Mild chronic ischemic white matter disease. No acute intracranial abnormality seen.  Multilevel degenerative disc disease is noted in the cervical spine. No fracture or other acute abnormality is noted. Electronically Signed   By: Lupita Raider, M.D.   On: 04/22/2018 11:09   MR Brain Wo Contrast (neuro protocol)  Result Date: 04/22/2018 CLINICAL DATA:  Numbness or tingling, paresthesia. Intermittent left arm numbness for 2 weeks EXAM: MRI HEAD WITHOUT CONTRAST TECHNIQUE: Multiplanar, multiecho pulse sequences of the brain and surrounding structures were obtained without intravenous contrast. COMPARISON:  CT from earlier today FINDINGS: Brain: Small focus of subarachnoid FLAIR hyperintensity and gradient hypointensity along the precentral gyrus on the right, this area is high-density by CT. No other site of hemorrhage seen, either acute or chronic. There is mild for age chronic small vessel ischemia in the cerebral white matter. Mild cerebral volume loss. Clustered appearance of gyri at the left vertex, nonspecific and likely noncontributory. No underlying mass is seen and there is no extrinsic mass effect by collection. No hydrocephalus. Vascular: Major flow voids are preserved Skull and upper cervical spine: No evidence of marrow lesion. Diffuse cervical facet spurring with mild C3-4 and C4-5 anterolisthesis. Sinuses/Orbits: Left cataract resection. Other: These results were called by telephone at the time of interpretation on 04/22/2018 at 2:10 pm to Dr. Samuel Jester ,  who verbally acknowledged these results. IMPRESSION: 1. Small volume subarachnoid hemorrhage along the high right frontal convexity. Trauma, coagulopathy, or vasculopathy/amyloid are primary considerations in this location. There is normal superior sagittal sinus flow voids. No acute or remote hemorrhage seen elsewhere. 2. Mild atrophy and chronic small vessel ischemia for age. Electronically Signed   By: Marnee Spring M.D.   On: 04/22/2018 14:14   EEG adult  Result Date: 04/23/2018 Rejeana Brock, MD      04/23/2018  1:34 PM History: 86 yo F with intermittent numbness Sedation: None Technique: This is a 21 channel routine scalp EEG performed at the bedside with bipolar and monopolar montages arranged in accordance to the international 10/20 system of electrode placement. One channel was dedicated to EKG recording. Background: The background consists of intermixed alpha and beta activities. There is a well defined posterior dominant rhythm of 9-10 Hz that attenuates with eye opening. Sleep is not recorded. Photic stimulation: Physiologic driving is not performed EEG Abnormalities: None Clinical Interpretation: This normal EEG is recorded in the waking state. There was no seizure or seizure predisposition recorded on this study. Please note that a normal EEG does not preclude the possibility of epilepsy. Ritta Slot, MD [facility] [phone] If 7pm- 7am, please page neurology on call as listed in AMION.   ECHOCARDIOGRAM COMPLETE  Result Date: 04/23/2018                            [facility]*                   *[facility]*                         [address]                        [place], [place] [address]                            [phone] ------------------------------------------------------------------- Transthoracic Echocardiography Patient:    Ksenia, Kunz MR #:       [id number] Study Date: 04/23/2018 Gender:     F Age:        87 Height:     154.9 cm Weight:     50.6 kg BSA:        1.48 m^2 Pt. Status: Room:       3W19C  ATTENDING    Samuel Jester [id number]  PERFORMING   [facility], Inpatient  ADMITTING    Emokpae, Courage  ORDERING     Emokpae, Courage  REFERRING    Emokpae, Courage  SONOGRAPHER  Belva Chimes cc: ------------------------------------------------------------------- LV EF: 55% -   60% ------------------------------------------------------------------- Indications:      TIA 435.9. -------------------------------------------------------------------  Study Conclusions - Left ventricle: The cavity size was normal. Wall thickness was   normal. Systolic function was normal. The estimated ejection   fraction was in the range of 55% to 60%. Wall motion was normal;   there were no regional wall motion abnormalities. Doppler   parameters are consistent with abnormal left ventricular   relaxation (grade 1 diastolic dysfunction). - Aortic valve: Valve area (VTI): 2.42 cm^2. Valve area (Vmax):   2.38 cm^2. Valve area (Vmean): 2.23 cm^2. ------------------------------------------------------------------- Study data:  Comparison was made to the study of 03/02/2018.  Study status:  Routine.  Procedure:  Transthoracic echocardiography. Image quality was good.  Study completion:  There were no complications.          Transthoracic echocardiography.  2D, spectral Doppler, and color Doppler.  Birthdate:  Patient birthdate: Dec 18, 1930.  Age:  Patient is 86 yr old.  Sex:  Gender: female.    BMI: 21.1 kg/m^2.  Blood pressure:     131/72  Patient status:  Inpatient.  Study date:  Study date: 04/23/2018. Study time: 02:16 PM.  Location:  Bedside. ------------------------------------------------------------------- ------------------------------------------------------------------- Left ventricle:  The cavity size was normal. Wall thickness was normal. Systolic function was normal. The estimated ejection fraction was in the range of 55% to 60%. Wall motion was normal; there were no regional wall motion abnormalities. Doppler parameters are consistent with abnormal left ventricular relaxation (grade 1 diastolic dysfunction). ------------------------------------------------------------------- Aortic valve:   Mildly calcified leaflets. Cusp separation was normal.  Doppler:  Transvalvular velocity was within the normal range. There was no stenosis. There was no regurgitation.    VTI ratio of LVOT to aortic valve: 0.77. Valve area (VTI): 2.42 cm^2. Indexed valve area (VTI): 1.64 cm^2/m^2.  Peak velocity ratio of LVOT to aortic valve: 0.76. Valve area (Vmax): 2.38 cm^2. Indexed valve area (Vmax): 1.61 cm^2/m^2. Mean velocity ratio of LVOT to aortic valve: 0.71. Valve area (Vmean): 2.23 cm^2. Indexed valve area (Vmean): 1.51 cm^2/m^2.    Mean gradient (S): 3 mm Hg. Peak gradient (S): 5 mm Hg. ------------------------------------------------------------------- Aorta:  Aortic root: The aortic root was normal in size. Ascending aorta: The ascending aorta was normal in size. ------------------------------------------------------------------- Mitral valve:   Structurally normal valve.   Leaflet separation was normal.  Doppler:  Transvalvular velocity was within the normal range. There was no evidence for stenosis. There was no regurgitation. ------------------------------------------------------------------- Left atrium:  The atrium was normal in size. ------------------------------------------------------------------- Right ventricle:  The cavity size was normal. Wall thickness was normal. Systolic function was normal. ------------------------------------------------------------------- Pulmonic valve:   Poorly visualized.  Doppler:  There was no significant regurgitation. ------------------------------------------------------------------- Tricuspid valve:   Structurally normal valve.   Leaflet separation was normal.  Doppler:  Transvalvular velocity was within the normal range. There was mild regurgitation. ------------------------------------------------------------------- Right atrium:  The atrium was normal in size. ------------------------------------------------------------------- Pericardium:  There was no pericardial effusion. ------------------------------------------------------------------- Systemic veins: Inferior vena cava: The vessel was normal in size. The respirophasic diameter changes were in the normal range (>= 50%), consistent with normal central venous pressure.  ------------------------------------------------------------------- Measurements  Left ventricle                           Value          Reference  LV ID, ED, PLAX chordal          (L)     42.7  mm       43 - 52  LV ID, ES, PLAX chordal                  29.4  mm       23 - 38  LV fx shortening, PLAX chordal           31    %        >=29  LV PW thickness, ED                      4.55  mm       ----------  IVS/LV PW ratio,  ED              (H)     1.58           <=1.3  Stroke volume, 2D                        65    ml       ----------  Stroke volume/bsa, 2D                    44    ml/m^2   ----------  LV e&', lateral                           5.87  cm/s     ----------  LV E/e&', lateral                         10.75          ----------  LV s&', lateral                           6.42  cm/s     ----------  LV e&', medial                            4.24  cm/s     ----------  LV E/e&', medial                          14.88          ----------  LV e&', average                           5.06  cm/s     ----------  LV E/e&', average                         12.48          ----------   Ventricular septum                       Value          Reference  IVS thickness, ED                        7.2   mm       ----------   LVOT                                     Value          Reference  LVOT ID, S                               20    mm       ----------  LVOT area                                3.14  cm^2     ----------  LVOT peak velocity, S  87.9  cm/s     ----------  LVOT mean velocity, S                    58.4  cm/s     ----------  LVOT VTI, S                              20.7  cm       ----------   Aortic valve                             Value          Reference  Aortic valve peak velocity, S            116   cm/s     ----------  Aortic valve mean velocity, S            82.4  cm/s     ----------  Aortic valve VTI, S                      26.9  cm       ----------  Aortic mean gradient, S                  3      mm Hg    ----------  Aortic peak gradient, S                  5     mm Hg    ----------  VTI ratio, LVOT/AV                       0.77           ----------  Aortic valve area, VTI                   2.42  cm^2     ----------  Aortic valve area/bsa, VTI               1.64  cm^2/m^2 ----------  Velocity ratio, peak, LVOT/AV            0.76           ----------  Aortic valve area, peak velocity         2.38  cm^2     ----------  Aortic valve area/bsa, peak              1.61  cm^2/m^2 ----------  velocity  Velocity ratio, mean, LVOT/AV            0.71           ----------  Aortic valve area, mean velocity         2.23  cm^2     ----------  Aortic valve area/bsa, mean              1.51  cm^2/m^2 ----------  velocity   Aorta                                    Value          Reference  Aortic root ID, ED                       27  mm       ----------   Left atrium                              Value          Reference  LA ID, A-P, ES                           25    mm       ----------  LA ID/bsa, A-P                           1.69  cm/m^2   <=2.2  LA volume, S                             38.7  ml       ----------  LA volume/bsa, S                         26.2  ml/m^2   ----------  LA volume, ES, 1-p A4C                   31.7  ml       ----------  LA volume/bsa, ES, 1-p A4C               21.5  ml/m^2   ----------  LA volume, ES, 1-p A2C                   45.7  ml       ----------  LA volume/bsa, ES, 1-p A2C               30.9  ml/m^2   ----------   Mitral valve                             Value          Reference  Mitral E-wave peak velocity              63.1  cm/s     ----------  Mitral A-wave peak velocity              95.1  cm/s     ----------  Mitral deceleration time         (H)     363   ms       150 - 230  Mitral E/A ratio, peak                   0.7            ----------   Pulmonary veins                          Value          Reference  Pulmonary vein peak velocity, S          50    cm/s     ----------   Pulmonary vein peak velocity, D          28    cm/s     ----------  Pulmonary vein velocity ratio,           1.8            ----------  peak, S/D   Pulmonary arteries                       Value          Reference  PA pressure, S, DP                       27    mm Hg    <=30   Tricuspid valve                          Value          Reference  Tricuspid regurg peak velocity           247   cm/s     ----------  Tricuspid peak RV-RA gradient            24    mm Hg    ----------   Right atrium                             Value          Reference  RA ID, S-I, ES, A4C              (H)     49.8  mm       34 - 49  RA area, ES, A4C                         13.2  cm^2     8.3 - 19.5  RA volume, ES, A/L                       29    ml       ----------  RA volume/bsa, ES, A/L                   19.6  ml/m^2   ----------   Systemic veins                           Value          Reference  Estimated CVP                            3     mm Hg    ----------   Right ventricle                          Value          Reference  RV ID, ED, PLAX                          33.1  mm       19 - 38  TAPSE                                    19.2  mm       ----------  RV pressure, S, DP                       27    mm Hg    <=30  RV s&', lateral, S                        10.4  cm/s     ----------   Pulmonic valve                           Value          Reference  Pulmonic valve peak velocity, S          84.2  cm/s     ---------- Legend: (L)  and  (H)  mark values outside specified reference range. ------------------------------------------------------------------- Prepared and Electronically Authenticated by Nicholes Mango, MD 2019-08-10T15:35:38   Assessment & Plan:      Sonda Primes, MD

## 2020-03-20 NOTE — Assessment & Plan Note (Signed)
Worse Ms Pretlow needs help with house chores due to her medical issues.

## 2020-03-20 NOTE — Assessment & Plan Note (Signed)
On Keppra Vickie Wilson needs help with house chores due to her medical issues.

## 2020-03-20 NOTE — Assessment & Plan Note (Signed)
Hold Crestor x 2-3 wks Try Tylenol  Ms Archbold needs help with house chores due to her medical issues.

## 2020-03-20 NOTE — Assessment & Plan Note (Signed)
Paroxetine?

## 2020-03-22 ENCOUNTER — Ambulatory Visit: Payer: Medicare Other

## 2020-03-26 ENCOUNTER — Other Ambulatory Visit: Payer: Self-pay | Admitting: Internal Medicine

## 2020-03-27 ENCOUNTER — Ambulatory Visit: Payer: Medicare Other | Admitting: Adult Health

## 2020-03-27 ENCOUNTER — Encounter: Payer: Self-pay | Admitting: Adult Health

## 2020-03-27 VITALS — BP 130/70 | HR 55 | Ht 59.0 in | Wt 112.0 lb

## 2020-03-27 DIAGNOSIS — G40109 Localization-related (focal) (partial) symptomatic epilepsy and epileptic syndromes with simple partial seizures, not intractable, without status epilepticus: Secondary | ICD-10-CM | POA: Diagnosis not present

## 2020-03-27 MED ORDER — LEVETIRACETAM 750 MG PO TABS: 750.0000 mg | ORAL_TABLET | Freq: Two times a day (BID) | ORAL | 3 refills | Status: DC

## 2020-03-27 NOTE — Patient Instructions (Addendum)
Your Plan:  Continue keppra 750mg  twice daily for seizure prophylaxis  Please follow-up with your cardiologist or PCP in regards to lower extremity pains to rule out heart related cause    Follow-up in 1 year or call earlier if needed     Thank you for coming to see at [facility]. I hope we have been able to provide you high quality care today.  You may receive a patient satisfaction survey over the next few weeks. We would appreciate your feedback and comments so that we may continue to improve ourselves and the health of our patients.

## 2020-03-27 NOTE — Progress Notes (Signed)
[facility] [address] [place]. [place] [address] [phone]       OFFICE FOLLOW UP NOTE  Vickie. Vickie Wilson Date of Birth:  March 13, 1931 Medical Record Number:  [id number]   Referring MD:  Albertine Grates  Reason for Referral:  seizure   CC: Seizures   HPI  Today, 03/27/2020, Vickie Wilson returns for seizure follow-up.  She has been doing well since prior visit without recurrent seizure activity or events.  Remains on Keppra 750 mg twice daily tolerating dosage without side effects.  Currently following with PCP for lower extremity pains and fatigue with Crestor currently on hold for possible medication side effect.  She does have follow-up in the near future.  Follows with cardiology regularly.  Blood pressure stable at 130/70.  No concerns at this time.   History provided for reference purposes only Update 03/27/2019 [name]: She returns for follow-up after last visit in December 2019 with [name], nurse practitioner.  She is doing well and has not had any recurrent episodes of sensory seizures or any other neurological issues.  She remains on Keppra 750 mg twice daily which she is tolerating well without dizziness or other side effects.  She woke up 1 day about a month ago with feeling of coldness and heaviness in both her feet but this does not last long and cleared in an hour or 2.  She did not seek medical help.  She saw her primary care physician few weeks ago who did some lab work all of which was satisfactory.  She states her blood pressure usually runs good but today it is slightly elevated in our office and 155/80 she feels this may be due to white coat hypertension.  She has no new complaints today.  Interval history 08/30/2018 [name] is being seen today for 85-month follow-up visit.  She did undergo repeat EEG which was normal without evidence of seizure activity.  She did increase keppra after prior appointment.  Approximately 3 to 4 weeks ago, patient did  experience episode of left hand numbness which lasted approximately 15 seconds and then resolved.  She denies any additional episode occurring.  She does endorse fatigue during the day and tiring quicker than before but she is unsure if this is due to medications versus old age.  She does experience some unsteadiness at times but was also experiencing this prior to her [facility] and being on keppra.  She did undergo a couple physical therapy sessions but financially was unable to continue.  She did receive handout regarding exercises to do at home to assist with balance but she does admit to not doing this.  She does state that she consistently stays busy as she does live independently and manages all ADLs and IADLs.  She is able to continue to ambulate without assistive device and denies any recent falls.  She also states that intermittently she will have a burning sensation/sharp pinprick sensation on her left middle finger knuckle and left AC area which may only last for 1 to 2 minutes and then subside.  She believes that this pain could be due to arthritis and is not overly concerned as it happens infrequently.  Pressure today 141/80.  She continues on Crestor for HLD management.  No further concerns at this time.  Denies new or worsening stroke/TIA symptoms or seizure activity.   initial visit 05/26/2018 PS: Vickie Wilson is a pleasant 33 year Caucasian lady seen today for initial consultation visit following hospital admission  recently for paresthesias. History is provided by the patient and review of electronic medical records. I personally reviewed imaging films.Vickie Wilson is an 86 y.o. female past medical history of hypertension, hyperlipidemia, diabetes mellitus, atrial mass who presents to the emergency room on 04/22/18 after having multiple spells of numbness over the left hand extending to the arm as well as left facial numbness.She stated her first episode was about 1 week ago lasting about 15 minutes mainly  if he affecting her left hand, arm as well as the left side of her face. Resolved spontaneously.  Continued to have 3 more episodes and therefore today she decided to come to the hospital.  She denies any weakness, however states she was not able to use her hand like she normally could.  Patient denies any jerking/twitching movements of the left arm or face. Work-up in the ER included MRI brain which revealed a small area of subarachnoid hemorrhage on MRI brain in the right frontal lobe convexity only. This was difficult to visualize on the CT scan corresponding cuts..  Her CT scan was   negative h .  She denied any history of recent head trauma, however she had a fall few weeks ago. She was on ASA at home. EEG done on 04/23/18 was normal. Transthoracic echo showed normal ejection fraction. Patient was started on Keppra 500 twice daily and had no further episodes after that was started. 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 Neurosurgery was consulted and recommended conservative follow-up for the same. Patient states she was doing well after discharge until 5 days ago when she had 2 transient episodes of numbness in her left hand gradually going up to her left elbow but this time not involving her face. This occurred twice on one day and then once the next day. She has not had these episodes for the last 3 days at least. She remains on Keppra 500 twice daily which is tolerating well without any side effects. She states she has not missed any doses.     ROS:   14 system review of systems is positive for leg pain, fatigue only and all other systems negative  PMH:  Past Medical History:  Diagnosis Date  . Allergy   . Anxiety   . Atrial mass    right  . Constipation   . Depression   . Diabetes mellitus   . Hyperlipidemia   . Hypertension   . Osteoporosis   . Seizures (HCC)      Social History:  Social History   Socioeconomic History  . Marital status: Single    Spouse name: Not on file  . Number of children: 1  . Years of education: Not on file  . Highest education level: 12th grade  Occupational History  . Occupation: retired  Tobacco Use  . Smoking status: Former Smoker    Packs/day: 1.00    Years: 20.00    Pack years: 20.00    Quit date: 03/13/1998    Years since quitting: 22.0  . Smokeless tobacco: Never Used  Vaping Use  . Vaping Use: Never used  Substance and Sexual Activity  . Alcohol use: No  . Drug use: No  . Sexual activity: Not Currently  Other Topics Concern  . Not on file  Social History Narrative   Regular exercise-yes      Daily caffeine use  Right handed       Lives at home alone    Social Determinants of Health   Financial Resource Strain:   . Difficulty of Paying Living Expenses:   Food Insecurity:   . Worried About Programme researcher, broadcasting/film/video in the Last Year:   . Barista in the Last Year:   Transportation Needs:   . Freight forwarder (Medical):   Marland Kitchen Lack of Transportation (Non-Medical):   Physical Activity:   . Days of Exercise per Week:   . Minutes of Exercise per Session:   Stress:   . Feeling of Stress :   Social Connections:   . Frequency of Communication with Friends and Family:   . Frequency of Social Gatherings with Friends and Family:   . Attends Religious Services:   . Active Member of Clubs or Organizations:   . Attends Banker Meetings:   Marland Kitchen Marital Status:   Intimate Partner Violence:   . Fear of Current or Ex-Partner:   . Emotionally Abused:   Marland Kitchen Physically Abused:   . Sexually Abused:     Medications:   Current Outpatient Medications on File Prior to Visit  Medication Sig Dispense Refill  . acetaminophen (TYLENOL 8 HOUR) 650 MG CR tablet Take 1 tablet (650 mg total) by mouth every 8 (eight) hours as needed for pain. 100 tablet 3  . amLODipine (NORVASC) 5 MG tablet  TAKE 1 TABLET ONCE DAILY. 90 tablet 3  . atenolol (TENORMIN) 100 MG tablet TAKE (1/2) TABLET TWICE DAILY. 90 tablet 3  . Cholecalciferol (VITAMIN D3) 50 MCG (2000 UT) capsule Take 1 capsule (2,000 Units total) by mouth daily. 100 capsule 3  . levETIRAcetam (KEPPRA) 750 MG tablet Take 1 tablet (750 mg total) by mouth 2 (two) times daily. 180 tablet 3  . Multiple Vitamins-Minerals (CENTRUM SILVER) CHEW Chew 1 each by mouth 3 (three) times a week.     Marland Kitchen PARoxetine (PAXIL) 10 MG tablet TAKE 1 TABLET IN THE MORNING. 90 tablet 1  . rosuvastatin (CRESTOR) 20 MG tablet TAKE 1/2 TABLET MONDAY, WEDNESDAY & FRIDAY. (Patient not taking: Reported on 03/27/2020) 19 tablet 2   No current facility-administered medications on file prior to visit.    Allergies:   Allergies  Allergen Reactions  . Captopril     REACTION: cough     Vitals Today's Vitals   03/27/20 1444  BP: 130/70  Pulse: (!) 55  Weight: 112 lb (50.8 kg)  Height: 4\' 11"  (1.499 m)   Body mass index is 22.62 kg/m.   Physical Exam General: frail petite pleasant elderly Caucasian lady, seated, in no evident distress Head: head normocephalic and atraumatic.   Neck: supple with no carotid or supraclavicular bruits Cardiovascular: regular rate and rhythm, no murmurs Musculoskeletal: mild kyphosis Skin:  no rash/petichiae Vascular:  Normal pulses all extremities  Neurologic Exam Mental Status: Awake and fully alert. Oriented to place and time. Recent and remote memory intact. Attention span, concentration and fund of knowledge appropriate. Mood and affect appropriate.  Cranial Nerves: Pupils equal, briskly reactive to light. Extraocular movements full without nystagmus. Visual fields full to confrontation. Hearing diminished bilaterally. Facial sensation intact. Face, tongue, palate moves normally and symmetrically.  Motor: Normal bulk and tone. Normal strength in all tested extremity muscles. Sensory.: intact to touch , pinprick ,  position and vibratory sensation.  Coordination: Rapid alternating movements normal in all extremities. Finger-to-nose and heel-to-shin performed accurately bilaterally. Gait and Station: Arises from chair  without difficulty. Stance is normal. Gait demonstrates normal stride length and balance .  Unable to heel, toe and tandem walk due to difficulty Reflexes: 1+ and symmetric. Toes downgoing.      ASSESSMENT: 86 year old lady with recurrent fleeting left arm and face paresthesias likely simple partial seizures likely symptomatic from small localized right frontal convexity subarachnoid hemorrhage which is non-aneurysmal and non-traumatic in etiology. 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 She had only one breakthrough 1 simple partial seizure episode since hospital admission in August 2019 consisting of left hand numbness and continues on Keppra 750 mg twice daily.    PLAN:  Continue Keppra 750 mg twice daily for seizure prophylaxis -refill provided She was advised to maintain strict control of hypertension with blood pressure goal below 130/90.   Continue to follow with PCP for lower extremity concerns and fatigue.  Advised possibly need follow-up with cardiology to rule out cardiac source.   Follow-up in 1 year or call earlier if needed  I spent 20 minutes of face-to-face and non-face-to-face time with patient.  This included previsit chart review, lab review, study review, order entry, electronic health record documentation, patient education regarding seizures currently managed on Keppra, importance of managing stroke risk factors, and answered all questions to patient satisfaction   Ihor AustinJessica McCue, [facility] Mountain Surgical CenterGNP-BC  [facility] [address] [place], [place] [id number]  Phone [phone] Fax [phone] Note: This document was prepared with digital  dictation and possible smart phrase technology. Any transcriptional errors that result from this process are unintentional.

## 2020-03-28 NOTE — Progress Notes (Signed)
I agree with the above plan 

## 2020-04-17 ENCOUNTER — Telehealth: Payer: Self-pay | Admitting: Internal Medicine

## 2020-04-17 NOTE — Telephone Encounter (Signed)
   Patient would like to know if she needs start taking rosuvastatin (CRESTOR) 20 MG tablet again. Patient states her legs feel better

## 2020-04-19 NOTE — Telephone Encounter (Signed)
Left pt detailed message of below 

## 2020-04-19 NOTE — Telephone Encounter (Signed)
Yes please.  Take Crestor 3 times a week.  Thanks

## 2020-04-25 ENCOUNTER — Telehealth: Payer: Self-pay | Admitting: Internal Medicine

## 2020-04-25 NOTE — Telephone Encounter (Signed)
New Message:   Pt is calling and states she dropped a heavy coffee mug on her right foot. She states that is has been red since then and she has a cut on her big toe. Pt is scheduled to come in the office on 05/01/20 to have it looked at but would like for Dr. Macario Golds to know. Please advise.

## 2020-04-26 NOTE — Telephone Encounter (Signed)
I am sorry. Please see another available provider sooner. Thanks

## 2020-04-29 NOTE — Telephone Encounter (Signed)
Patient declined sooner appt

## 2020-04-29 NOTE — Telephone Encounter (Signed)
Left pt a vm informing her that MD wants her to see another provider, if possible, prior to her upcoming appointment

## 2020-05-01 ENCOUNTER — Ambulatory Visit (INDEPENDENT_AMBULATORY_CARE_PROVIDER_SITE_OTHER): Payer: Medicare Other | Admitting: Internal Medicine

## 2020-05-01 ENCOUNTER — Encounter: Payer: Self-pay | Admitting: Internal Medicine

## 2020-05-01 ENCOUNTER — Other Ambulatory Visit: Payer: Self-pay

## 2020-05-01 DIAGNOSIS — S91311A Laceration without foreign body, right foot, initial encounter: Secondary | ICD-10-CM | POA: Diagnosis not present

## 2020-05-01 DIAGNOSIS — S96921A Laceration of unspecified muscle and tendon at ankle and foot level, right foot, initial encounter: Secondary | ICD-10-CM | POA: Diagnosis not present

## 2020-05-01 NOTE — Assessment & Plan Note (Signed)
A 3 cm healing laceration on R dorsal foot, healing R 4th toe "drop" - a dorsiflexor  tendon laceration vs other Podiatry ref

## 2020-05-01 NOTE — Patient Instructions (Signed)
.  avpcovi

## 2020-05-01 NOTE — Progress Notes (Signed)
Subjective:  Patient ID: Vickie Wilson, female    DOB: 03-05-1931  Age: 86 y.o. MRN: [id number]  CC: No chief complaint on file.   HPI [name] presents for a laceration on R foot - dropped a heavy coffee mug on it 7-8 d ago. C/o R 4th toe "drop" after the injury  Outpatient Medications Prior to Visit  Medication Sig Dispense Refill  . acetaminophen (TYLENOL 8 HOUR) 650 MG CR tablet Take 1 tablet (650 mg total) by mouth every 8 (eight) hours as needed for pain. 100 tablet 3  . amLODipine (NORVASC) 5 MG tablet TAKE 1 TABLET ONCE DAILY. 90 tablet 3  . atenolol (TENORMIN) 100 MG tablet TAKE (1/2) TABLET TWICE DAILY. 90 tablet 3  . Cholecalciferol (VITAMIN D3) 50 MCG (2000 UT) capsule Take 1 capsule (2,000 Units total) by mouth daily. 100 capsule 3  . levETIRAcetam (KEPPRA) 750 MG tablet Take 1 tablet (750 mg total) by mouth 2 (two) times daily. 180 tablet 3  . Multiple Vitamins-Minerals (CENTRUM SILVER) CHEW Chew 1 each by mouth 3 (three) times a week.     Marland Kitchen PARoxetine (PAXIL) 10 MG tablet TAKE 1 TABLET IN THE MORNING. 90 tablet 1  . rosuvastatin (CRESTOR) 20 MG tablet TAKE 1/2 TABLET MONDAY, WEDNESDAY & FRIDAY. 19 tablet 2   No facility-administered medications prior to visit.    ROS: Review of Systems  Constitutional: Negative for fever.  Musculoskeletal: Negative for gait problem and joint swelling.  Skin: Positive for wound.  Neurological: Positive for weakness.  Hematological: Bruises/bleeds easily.    Objective:  BP 140/78 (BP Location: Right Arm, Patient Position: Sitting, Cuff Size: Normal)   Pulse 76   Temp 98.1 F (36.7 C) (Oral)   Ht 4\' 11"  (1.499 m)   Wt 111 lb (50.3 kg)   SpO2 97%   BMI 22.42 kg/m   BP Readings from Last 3 Encounters:  05/01/20 140/78  03/27/20 130/70  03/20/20 134/84    Wt Readings from Last 3 Encounters:  05/01/20 111 lb (50.3 kg)  03/27/20 112 lb (50.8 kg)  03/20/20 111 lb (50.3 kg)    Physical Exam Constitutional:       Appearance: Normal appearance.  Musculoskeletal:     Right lower leg: No edema.     Left lower leg: No edema.  Skin:    Findings: Erythema present. No bruising.  Neurological:     Mental Status: She is oriented to person, place, and time.     Gait: Gait normal.      a 3 cm healing laceration on R dorsal foot, healing  R 4th toe "drop"  Lab Results  Component Value Date   WBC 7.8 02/22/2019   HGB 12.5 02/22/2019   HCT 38.0 02/22/2019   PLT 324.0 02/22/2019   GLUCOSE 96 08/22/2019   CHOL 205 (H) 05/18/2019   TRIG 127.0 05/18/2019   HDL 54.30 05/18/2019   LDLDIRECT 125.6 01/16/2013   LDLCALC 125 (H) 05/18/2019   ALT 10 05/18/2019   AST 22 05/18/2019   NA 136 08/22/2019   K 4.4 08/22/2019   CL 101 08/22/2019   CREATININE 1.08 08/22/2019   BUN 16 08/22/2019   CO2 27 08/22/2019   TSH 1.93 02/22/2019   INR 0.92 04/22/2018   HGBA1C 6.1 08/22/2019   MICROALBUR 2.8 (H) 09/17/2016    CT ANGIO HEAD W OR WO CONTRAST  Result Date: 04/23/2018 CLINICAL DATA:  Subarachnoid hemorrhage, proven, follow-up. EXAM: CT ANGIOGRAPHY HEAD AND NECK  TECHNIQUE: Multidetector CT imaging of the head and neck was performed using the standard protocol during bolus administration of intravenous contrast. Multiplanar CT image reconstructions and MIPs were obtained to evaluate the vascular anatomy. Carotid stenosis measurements (when applicable) are obtained utilizing NASCET criteria, using the distal internal carotid diameter as the denominator. CONTRAST:  37mL ISOVUE-370 IOPAMIDOL (ISOVUE-370) INJECTION 76% COMPARISON:  The of conservatively or FINDINGS: CT HEAD FINDINGS Brain: Subarachnoid hemorrhage within the right middle frontal sulcus is stable. No new areas of hemorrhage are present. Atrophy and white matter disease is noted bilaterally. No mass lesion is present. No significant cortical infarct is present. Vascular: Atherosclerotic calcifications are present within the cavernous internal carotid  arteries. There is no hyperdense vessel. Skull: Calvarium is intact. No focal lytic or blastic lesions are present. Asymmetric degenerative changes are present at the right TMJ. Sinuses: The paranasal sinuses and mastoid air cells are clear. Orbits: Left lens replacement is present. Globes and orbits are otherwise within normal limits. Review of the MIP images confirms the above findings CTA NECK FINDINGS Aortic arch: A 3 vessel arch configuration is present. No significant stenosis or aneurysm is present. Right carotid system: The right common carotid artery is within normal limits. Dense atherosclerotic calcifications are present at the bifurcation without a significant stenosis relative to the more distal vessel. Left carotid system: The left common carotid artery is within normal limits. Atherosclerotic calcifications are present at the bifurcation without a significant luminal stenosis relative to the more distal vessel. Vertebral arteries: The vertebral arteries are codominant. Both vertebral arteries originate from the subclavian arteries without a significant stenosis. There is some tortuosity of the V1 segments bilaterally. No significant stenosis or vessel injury is present to either vertebral artery in the neck. Skeleton: Degenerative anterolisthesis is present at C2-3, C3-4, and C4-5. There is chronic loss of disc height with endplate sclerosis at C5-6 and C6-7. Marked lucency surrounds the roots of the residual right maxillary molar. Dental caries are present without periapical disease. Other neck: The soft tissues the neck are otherwise unremarkable. No significant adenopathy is present. No mucosal lesions are evident. Salivary glands are within normal limits bilaterally. Thyroid is normal. Upper chest: Centrilobular emphysematous changes are present bilaterally. No focal nodule or mass lesion is present. Review of the MIP images confirms the above findings CTA HEAD FINDINGS Anterior circulation:  Atherosclerotic calcifications are present within the cavernous internal carotid arteries bilaterally. There is no hyperdense vessel. 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 No definite posterior communicating artery is present. The A1 and M1 segments are normal. The anterior communicating artery is patent. MCA bifurcations are within normal limits bilaterally. The ACA and MCA branch vessels are normal. No focal vascular lesion is evident at the location of the subarachnoid hemorrhage over the right frontal convexity. Posterior circulation: Vertebral arteries are within normal limits bilaterally. PICA origins are visualized and normal. The basilar artery is normal. Both posterior cerebral arteries originate from the basilar tip. PCA branch vessels are within normal limits bilaterally. Venous sinuses: The dural sinuses are patent. Straight sinus and deep cerebral veins are intact. Cortical veins are normal. Anatomic variants: None Delayed phase: Delayed images demonstrate no pathologic enhancement. White matter disease is accentuated. Review of the MIP images confirms the above findings IMPRESSION: 1. No acute or focal vascular lesion to explain subarachnoid hemorrhage over the convexity of the right frontal lobe. 2. No mass lesion. 3. Stable atrophy and white matter disease. 4. 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 Recommend follow-up CTA of the head in 1 year to assess stability. This aneurysm does not correspond with the area of subarachnoid hemorrhage. Electronically Signed   By: Marin Roberts M.D.   On: 04/23/2018 19:28   DG Chest 2 View  Result Date: 04/22/2018 CLINICAL DATA:  Intermittent LEFT arm numbness for 2 weeks, history hypertension, diabetes mellitus, anxiety, former smoker EXAM: CHEST - 2 VIEW COMPARISON:  02/12/2018 FINDINGS: Normal heart size, mediastinal contours, and pulmonary vascularity. Atherosclerotic  calcification aorta. Lungs hyperinflated with minimal subsegmental atelectasis at LEFT costophrenic angle. Remaining lungs clear. No acute infiltrate, pleural effusion or pneumothorax. Bones appear demineralized with old healed fracture of the posterolateral RIGHT sixth rib. IMPRESSION: Hyperinflated lungs with minimal subsegmental atelectasis at LEFT costophrenic angle. Electronically Signed   By: Ulyses Southward M.D.   On: 04/22/2018 11:07   CT HEAD WO CONTRAST  Result Date: 04/22/2018 CLINICAL DATA:  Left arm numbness. EXAM: CT HEAD WITHOUT CONTRAST CT CERVICAL SPINE WITHOUT CONTRAST TECHNIQUE: Multidetector CT imaging of the head and cervical spine was performed following the standard protocol without intravenous contrast. Multiplanar CT image reconstructions of the cervical spine were also generated. COMPARISON:  None. FINDINGS: CT HEAD FINDINGS Brain: Mild diffuse cortical atrophy is noted. Mild chronic ischemic white matter disease is noted. No mass effect or midline shift is noted. Ventricular size is within normal limits. There is no evidence of mass lesion, hemorrhage or acute infarction. Vascular: No hyperdense vessel or unexpected calcification. Skull: Normal. Negative for fracture or focal lesion. Sinuses/Orbits: No acute finding. Other: None. CT CERVICAL SPINE FINDINGS Alignment: Grade 1 anterolisthesis of C3-4 C4-5 is noted secondary to posterior facet joint hypertrophy. Skull base and vertebrae: No acute fracture. No primary bone lesion or focal pathologic process. Soft tissues and spinal canal: No prevertebral fluid or swelling. No visible canal hematoma. Disc levels: Severe degenerative disc disease is noted at C5-6 and C6-7 with anterior osteophyte formation. Mild degenerative disc disease is noted at C4-5 and C7-T1. Upper chest: Negative. Other: Degenerative changes seen involving posterior facet joints bilaterally, right greater than left. IMPRESSION: Mild diffuse cortical atrophy. Mild chronic  ischemic white matter disease. No acute intracranial abnormality seen. Multilevel degenerative disc disease is noted in the cervical spine. No fracture or other acute abnormality is noted. Electronically Signed   By: Lupita Raider, M.D.   On: 04/22/2018 11:09   CT ANGIO NECK W OR WO CONTRAST  Result Date: 04/23/2018 CLINICAL DATA:  Subarachnoid hemorrhage, proven, follow-up. EXAM: CT ANGIOGRAPHY HEAD AND NECK TECHNIQUE: Multidetector CT imaging of the head and neck was performed using the standard protocol during bolus administration of intravenous contrast. Multiplanar CT image reconstructions and MIPs were obtained to evaluate the vascular anatomy. Carotid stenosis measurements (when applicable) are obtained utilizing NASCET criteria, using the distal internal carotid diameter as the denominator. CONTRAST:  50mL ISOVUE-370 IOPAMIDOL (ISOVUE-370) INJECTION 76% COMPARISON:  The of conservatively or FINDINGS: CT HEAD FINDINGS Brain: Subarachnoid hemorrhage within the right middle frontal sulcus is stable. No new areas of hemorrhage are present. Atrophy and white matter disease is noted bilaterally. No mass lesion is present. No significant cortical infarct is present. Vascular: Atherosclerotic calcifications are present within the cavernous internal carotid arteries. There is no hyperdense vessel. Skull: Calvarium is intact. No focal lytic or blastic lesions are present. Asymmetric degenerative changes are present at the right TMJ. Sinuses: The paranasal sinuses and mastoid air cells are clear. Orbits: Left  lens replacement is present. Globes and orbits are otherwise within normal limits. Review of the MIP images confirms the above findings CTA NECK FINDINGS Aortic arch: A 3 vessel arch configuration is present. No significant stenosis or aneurysm is present. Right carotid system: The right common carotid artery is within normal limits. Dense atherosclerotic calcifications are present at the bifurcation without  a significant stenosis relative to the more distal vessel. Left carotid system: The left common carotid artery is within normal limits. Atherosclerotic calcifications are present at the bifurcation without a significant luminal stenosis relative to the more distal vessel. Vertebral arteries: The vertebral arteries are codominant. Both vertebral arteries originate from the subclavian arteries without a significant stenosis. There is some tortuosity of the V1 segments bilaterally. No significant stenosis or vessel injury is present to either vertebral artery in the neck. Skeleton: Degenerative anterolisthesis is present at C2-3, C3-4, and C4-5. There is chronic loss of disc height with endplate sclerosis at C5-6 and C6-7. Marked lucency surrounds the roots of the residual right maxillary molar. Dental caries are present without periapical disease. Other neck: The soft tissues the neck are otherwise unremarkable. No significant adenopathy is present. No mucosal lesions are evident. Salivary glands are within normal limits bilaterally. Thyroid is normal. Upper chest: Centrilobular emphysematous changes are present bilaterally. No focal nodule or mass lesion is present. Review of the MIP images confirms the above findings CTA HEAD FINDINGS Anterior circulation: Atherosclerotic calcifications are present within the cavernous internal carotid arteries bilaterally. There is no hyperdense vessel. 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 No definite posterior communicating artery is present. The A1 and M1 segments are normal. The anterior communicating artery is patent. MCA bifurcations are within normal limits bilaterally. The ACA and MCA branch vessels are normal. No focal vascular lesion is evident at the location of the subarachnoid hemorrhage over the right frontal convexity. Posterior circulation: Vertebral arteries are within normal limits bilaterally. PICA origins are visualized  and normal. The basilar artery is normal. Both posterior cerebral arteries originate from the basilar tip. PCA branch vessels are within normal limits bilaterally. Venous sinuses: The dural sinuses are patent. Straight sinus and deep cerebral veins are intact. Cortical veins are normal. Anatomic variants: None Delayed phase: Delayed images demonstrate no pathologic enhancement. White matter disease is accentuated. Review of the MIP images confirms the above findings IMPRESSION: 1. No acute or focal vascular lesion to explain subarachnoid hemorrhage over the convexity of the right frontal lobe. 2. No mass lesion. 3. Stable atrophy and white matter disease. 4. 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 Recommend follow-up CTA of the head in 1 year to assess stability. This aneurysm does not correspond with the area of subarachnoid hemorrhage. Electronically Signed   By: Marin Roberts M.D.   On: 04/23/2018 19:28   CT Cervical Spine Wo Contrast  Result Date: 04/22/2018 CLINICAL DATA:  Left arm numbness. EXAM: CT HEAD WITHOUT CONTRAST CT CERVICAL SPINE WITHOUT CONTRAST TECHNIQUE: Multidetector CT imaging of the head and cervical spine was performed following the standard protocol without intravenous contrast. Multiplanar CT image reconstructions of the cervical spine were also generated. COMPARISON:  None. FINDINGS: CT HEAD FINDINGS Brain: Mild diffuse cortical atrophy is noted. Mild chronic ischemic white matter disease is noted. No mass effect or midline shift is noted. Ventricular size is within normal limits. There is no evidence of mass lesion, hemorrhage or acute infarction. Vascular: No hyperdense vessel or unexpected  calcification. Skull: Normal. Negative for fracture or focal lesion. Sinuses/Orbits: No acute finding. Other: None. CT CERVICAL SPINE FINDINGS Alignment: Grade 1 anterolisthesis of C3-4 C4-5 is noted secondary to posterior facet joint hypertrophy. Skull  base and vertebrae: No acute fracture. No primary bone lesion or focal pathologic process. Soft tissues and spinal canal: No prevertebral fluid or swelling. No visible canal hematoma. Disc levels: Severe degenerative disc disease is noted at C5-6 and C6-7 with anterior osteophyte formation. Mild degenerative disc disease is noted at C4-5 and C7-T1. Upper chest: Negative. Other: Degenerative changes seen involving posterior facet joints bilaterally, right greater than left. IMPRESSION: Mild diffuse cortical atrophy. Mild chronic ischemic white matter disease. No acute intracranial abnormality seen. Multilevel degenerative disc disease is noted in the cervical spine. No fracture or other acute abnormality is noted. Electronically Signed   By: Lupita Raider, M.D.   On: 04/22/2018 11:09   MR Brain Wo Contrast (neuro protocol)  Result Date: 04/22/2018 CLINICAL DATA:  Numbness or tingling, paresthesia. Intermittent left arm numbness for 2 weeks EXAM: MRI HEAD WITHOUT CONTRAST TECHNIQUE: Multiplanar, multiecho pulse sequences of the brain and surrounding structures were obtained without intravenous contrast. COMPARISON:  CT from earlier today FINDINGS: Brain: Small focus of subarachnoid FLAIR hyperintensity and gradient hypointensity along the precentral gyrus on the right, this area is high-density by CT. No other site of hemorrhage seen, either acute or chronic. There is mild for age chronic small vessel ischemia in the cerebral white matter. Mild cerebral volume loss. Clustered appearance of gyri at the left vertex, nonspecific and likely noncontributory. No underlying mass is seen and there is no extrinsic mass effect by collection. No hydrocephalus. Vascular: Major flow voids are preserved Skull and upper cervical spine: No evidence of marrow lesion. Diffuse cervical facet spurring with mild C3-4 and C4-5 anterolisthesis. Sinuses/Orbits: Left cataract resection. Other: These results were called by telephone at  the time of interpretation on 04/22/2018 at 2:10 pm to Dr. Samuel Jester , who verbally acknowledged these results. IMPRESSION: 1. Small volume subarachnoid hemorrhage along the high right frontal convexity. Trauma, coagulopathy, or vasculopathy/amyloid are primary considerations in this location. There is normal superior sagittal sinus flow voids. No acute or remote hemorrhage seen elsewhere. 2. Mild atrophy and chronic small vessel ischemia for age. Electronically Signed   By: Marnee Spring M.D.   On: 04/22/2018 14:14   EEG adult  Result Date: 04/23/2018 Rejeana Brock, MD     04/23/2018  1:34 PM History: 86 yo F with intermittent numbness Sedation: None Technique: This is a 21 channel routine scalp EEG performed at the bedside with bipolar and monopolar montages arranged in accordance to the international 10/20 system of electrode placement. One channel was dedicated to EKG recording. Background: The background consists of intermixed alpha and beta activities. There is a well defined posterior dominant rhythm of 9-10 Hz that attenuates with eye opening. Sleep is not recorded. Photic stimulation: Physiologic driving is not performed EEG Abnormalities: None Clinical Interpretation: This normal EEG is recorded in the waking state. There was no seizure or seizure predisposition recorded on this study. Please note that a normal EEG does not preclude the possibility of epilepsy. Ritta Slot, MD [facility] [phone] If 7pm- 7am, please page neurology on call as listed in AMION.   ECHOCARDIOGRAM COMPLETE  Result Date: 04/23/2018                            *  [place]*                   *[facility]*                         [address]                        [place], [place] [address]                            [phone] ------------------------------------------------------------------- Transthoracic Echocardiography Patient:    Pearlean, Sabina MR #:        [id number] Study Date: 04/23/2018 Gender:     F Age:        87 Height:     154.9 cm Weight:     50.6 kg BSA:        1.48 m^2 Pt. Status: Room:       [id number]  ATTENDING    Samuel Jester [id number]  PERFORMING   [facility], Inpatient  ADMITTING    Emokpae, Courage  ORDERING     Emokpae, Courage  REFERRING    Emokpae, Courage  SONOGRAPHER  Belva Chimes cc: ------------------------------------------------------------------- LV EF: 55% -   60% ------------------------------------------------------------------- Indications:      TIA 435.9. ------------------------------------------------------------------- Study Conclusions - Left ventricle: The cavity size was normal. Wall thickness was   normal. Systolic function was normal. The estimated ejection   fraction was in the range of 55% to 60%. Wall motion was normal;   there were no regional wall motion abnormalities. Doppler   parameters are consistent with abnormal left ventricular   relaxation (grade 1 diastolic dysfunction). - Aortic valve: Valve area (VTI): 2.42 cm^2. Valve area (Vmax):   2.38 cm^2. Valve area (Vmean): 2.23 cm^2. ------------------------------------------------------------------- Study data:  Comparison was made to the study of 03/02/2018.  Study status:  Routine.  Procedure:  Transthoracic echocardiography. Image quality was good.  Study completion:  There were no complications.          Transthoracic echocardiography.  2D, spectral Doppler, and color Doppler.  Birthdate:  Patient birthdate: 03-22-1931.  Age:  Patient is 86 yr old.  Sex:  Gender: female.    BMI: 21.1 kg/m^2.  Blood pressure:     131/72  Patient status:  Inpatient.  Study date:  Study date: 04/23/2018. Study time: 02:16 PM.  Location:  Bedside. ------------------------------------------------------------------- ------------------------------------------------------------------- Left ventricle:  The cavity size was normal. Wall thickness was normal. Systolic function was normal. The estimated  ejection fraction was in the range of 55% to 60%. Wall motion was normal; there were no regional wall motion abnormalities. Doppler parameters are consistent with abnormal left ventricular relaxation (grade 1 diastolic dysfunction). ------------------------------------------------------------------- Aortic valve:   Mildly calcified leaflets. Cusp separation was normal.  Doppler:  Transvalvular velocity was within the normal range. There was no stenosis. There was no regurgitation.    VTI ratio of LVOT to aortic valve: 0.77. Valve area (VTI): 2.42 cm^2. Indexed valve area (VTI): 1.64 cm^2/m^2. Peak velocity ratio of LVOT to aortic valve: 0.76. Valve area (Vmax): 2.38 cm^2. Indexed valve area (Vmax): 1.61 cm^2/m^2. Mean velocity ratio of LVOT to aortic valve: 0.71. Valve area (Vmean): 2.23 cm^2. Indexed valve area (Vmean): 1.51 cm^2/m^2.    Mean gradient (S): 3 mm Hg. Peak gradient (S): 5 mm Hg. ------------------------------------------------------------------- Aorta:  Aortic root: The aortic root was normal in  size. Ascending aorta: The ascending aorta was normal in size. ------------------------------------------------------------------- Mitral valve:   Structurally normal valve.   Leaflet separation was normal.  Doppler:  Transvalvular velocity was within the normal range. There was no evidence for stenosis. There was no regurgitation. ------------------------------------------------------------------- Left atrium:  The atrium was normal in size. ------------------------------------------------------------------- Right ventricle:  The cavity size was normal. Wall thickness was normal. Systolic function was normal. ------------------------------------------------------------------- Pulmonic valve:   Poorly visualized.  Doppler:  There was no significant regurgitation. ------------------------------------------------------------------- Tricuspid valve:   Structurally normal valve.   Leaflet separation was normal.   Doppler:  Transvalvular velocity was within the normal range. There was mild regurgitation. ------------------------------------------------------------------- Right atrium:  The atrium was normal in size. ------------------------------------------------------------------- Pericardium:  There was no pericardial effusion. ------------------------------------------------------------------- Systemic veins: Inferior vena cava: The vessel was normal in size. The respirophasic diameter changes were in the normal range (>= 50%), consistent with normal central venous pressure. ------------------------------------------------------------------- Measurements  Left ventricle                           Value          Reference  LV ID, ED, PLAX chordal          (L)     42.7  mm       43 - 52  LV ID, ES, PLAX chordal                  29.4  mm       23 - 38  LV fx shortening, PLAX chordal           31    %        >=29  LV PW thickness, ED                      4.55  mm       ----------  IVS/LV PW ratio, ED              (H)     1.58           <=1.3  Stroke volume, 2D                        65    ml       ----------  Stroke volume/bsa, 2D                    44    ml/m^2   ----------  LV e&', lateral                           5.87  cm/s     ----------  LV E/e&', lateral                         10.75          ----------  LV s&', lateral                           6.42  cm/s     ----------  LV e&', medial                            4.24  cm/s     ----------  LV E/e&', medial  14.88          ----------  LV e&', average                           5.06  cm/s     ----------  LV E/e&', average                         12.48          ----------   Ventricular septum                       Value          Reference  IVS thickness, ED                        7.2   mm       ----------   LVOT                                     Value          Reference  LVOT ID, S                               20    mm       ----------  LVOT area                                 3.14  cm^2     ----------  LVOT peak velocity, S                    87.9  cm/s     ----------  LVOT mean velocity, S                    58.4  cm/s     ----------  LVOT VTI, S                              20.7  cm       ----------   Aortic valve                             Value          Reference  Aortic valve peak velocity, S            116   cm/s     ----------  Aortic valve mean velocity, S            82.4  cm/s     ----------  Aortic valve VTI, S                      26.9  cm       ----------  Aortic mean gradient, S                  3     mm Hg    ----------  Aortic peak gradient, S                  5     mm  Hg    ----------  VTI ratio, LVOT/AV                       0.77           ----------  Aortic valve area, VTI                   2.42  cm^2     ----------  Aortic valve area/bsa, VTI               1.64  cm^2/m^2 ----------  Velocity ratio, peak, LVOT/AV            0.76           ----------  Aortic valve area, peak velocity         2.38  cm^2     ----------  Aortic valve area/bsa, peak              1.61  cm^2/m^2 ----------  velocity  Velocity ratio, mean, LVOT/AV            0.71           ----------  Aortic valve area, mean velocity         2.23  cm^2     ----------  Aortic valve area/bsa, mean              1.51  cm^2/m^2 ----------  velocity   Aorta                                    Value          Reference  Aortic root ID, ED                       27    mm       ----------   Left atrium                              Value          Reference  LA ID, A-P, ES                           25    mm       ----------  LA ID/bsa, A-P                           1.69  cm/m^2   <=2.2  LA volume, S                             38.7  ml       ----------  LA volume/bsa, S                         26.2  ml/m^2   ----------  LA volume, ES, 1-p A4C                   31.7  ml       ----------  LA volume/bsa, ES, 1-p A4C               21.5  ml/m^2   ----------  LA volume, ES, 1-p A2C  45.7  ml       ----------  LA volume/bsa, ES, 1-p A2C               30.9  ml/m^2   ----------   Mitral valve                             Value          Reference  Mitral E-wave peak velocity              63.1  cm/s     ----------  Mitral A-wave peak velocity              95.1  cm/s     ----------  Mitral deceleration time         (H)     363   ms       150 - 230  Mitral E/A ratio, peak                   0.7            ----------   Pulmonary veins                          Value          Reference  Pulmonary vein peak velocity, S          50    cm/s     ----------  Pulmonary vein peak velocity, D          28    cm/s     ----------  Pulmonary vein velocity ratio,           1.8            ----------  peak, S/D   Pulmonary arteries                       Value          Reference  PA pressure, S, DP                       27    mm Hg    <=30   Tricuspid valve                          Value          Reference  Tricuspid regurg peak velocity           247   cm/s     ----------  Tricuspid peak RV-RA gradient            24    mm Hg    ----------   Right atrium                             Value          Reference  RA ID, S-I, ES, A4C              (H)     49.8  mm       34 - 49  RA area, ES, A4C                         13.2  cm^2     8.3 - 19.5  RA volume, ES, A/L                       29    ml       ----------  RA volume/bsa, ES, A/L                   19.6  ml/m^2   ----------   Systemic veins                           Value          Reference  Estimated CVP                            3     mm Hg    ----------   Right ventricle                          Value          Reference  RV ID, ED, PLAX                          33.1  mm       19 - 38  TAPSE                                    19.2  mm       ----------  RV pressure, S, DP                       27    mm Hg    <=30  RV s&', lateral, S                        10.4  cm/s     ----------   Pulmonic valve                           Value          Reference  Pulmonic valve peak  velocity, S          84.2  cm/s     ---------- Legend: (L)  and  (H)  mark values outside specified reference range. ------------------------------------------------------------------- Prepared and Electronically Authenticated by Nicholes Mango, MD [id number]:38   Assessment & Plan:   There are no diagnoses linked to this encounter.   No orders of the defined types were placed in this encounter.    Follow-up: No follow-ups on file.  Sonda Primes, MD

## 2020-05-02 ENCOUNTER — Ambulatory Visit: Payer: Medicare Other | Admitting: Podiatry

## 2020-05-02 ENCOUNTER — Ambulatory Visit (INDEPENDENT_AMBULATORY_CARE_PROVIDER_SITE_OTHER): Payer: Medicare Other

## 2020-05-02 ENCOUNTER — Encounter: Payer: Self-pay | Admitting: Podiatry

## 2020-05-02 VITALS — Temp 97.8°F

## 2020-05-02 DIAGNOSIS — S91311A Laceration without foreign body, right foot, initial encounter: Secondary | ICD-10-CM

## 2020-05-02 DIAGNOSIS — M2041 Other hammer toe(s) (acquired), right foot: Secondary | ICD-10-CM | POA: Diagnosis not present

## 2020-05-02 DIAGNOSIS — S96921A Laceration of unspecified muscle and tendon at ankle and foot level, right foot, initial encounter: Secondary | ICD-10-CM | POA: Diagnosis not present

## 2020-05-09 ENCOUNTER — Ambulatory Visit: Payer: Medicare Other | Admitting: Podiatry

## 2020-05-23 NOTE — Progress Notes (Signed)
Subjective:   Patient ID: Vickie Wilson, female   DOB: 86 y.o.   MRN: [id number]   HPI Patient presents stating the fourth toe appears to have lowered and she is concerned about this being a problem and stated she did have trauma to her foot 8 days ago and it occurred spontaneously after that.  Patient does not smoke and tries to be active   Review of Systems  All other systems reviewed and are negative.       Objective:  Physical Exam Vitals and nursing note reviewed.  Constitutional:      Appearance: She is well-developed.  Pulmonary:     Effort: Pulmonary effort is normal.  Musculoskeletal:        General: Normal range of motion.  Skin:    General: Skin is warm.  Neurological:     Mental Status: She is alert.     Neurovascular status intact muscle strength found to be adequate range of motion is within normal limits.  Patient has a definite lowering of the fourth toe right and it does not appear there is extensor function and there is a laceration on top of the foot from injury experienced in the midfoot about 8 days ago with patient found to have good digit perfusion well oriented x3     Assessment:  Probability of a laceration of the extensor tendon fourth digit right with lowering the toe     Plan:  H&P x-ray reviewed condition discussed.  We will get a try to just use buttress padding to lift the toe as I think given her advanced age trying to repair the tendon would be difficult.  Patient is in agreement to this and is not concerned but just wanted to get it checked and we will see how this does for her  X-rays are negative for signs of fracture and did indicate lowering of the fourth toe right

## 2020-05-29 ENCOUNTER — Other Ambulatory Visit: Payer: Self-pay | Admitting: Internal Medicine

## 2020-06-10 ENCOUNTER — Other Ambulatory Visit: Payer: Self-pay | Admitting: Internal Medicine

## 2020-06-18 ENCOUNTER — Ambulatory Visit: Payer: Medicare Other | Attending: Internal Medicine

## 2020-06-18 DIAGNOSIS — Z23 Encounter for immunization: Secondary | ICD-10-CM

## 2020-06-18 NOTE — Progress Notes (Signed)
   [facility]ov[facility] Vaccination [facility]  Name:  SARAJANE FAMBROUGH    MRN: [id number] DOB: June 12, 1931  06/18/2020  Ms. Amezcua was observed post Covid-19 immunization for 15 minutes without incident. She was provided with Vaccine Information Sheet and instruction to access the V-Safe system.   Ms. Vosler was instructed to call [phone] with any severe reactions post vaccine: Marland Kitchen Difficulty breathing  . Swelling of face and throat  . A fast heartbeat  . A bad rash all over body  . Dizziness and weakness

## 2020-06-19 ENCOUNTER — Ambulatory Visit: Payer: Medicare Other

## 2020-06-27 DIAGNOSIS — H524 Presbyopia: Secondary | ICD-10-CM | POA: Diagnosis not present

## 2020-06-27 DIAGNOSIS — H26492 Other secondary cataract, left eye: Secondary | ICD-10-CM | POA: Diagnosis not present

## 2020-06-27 DIAGNOSIS — H353111 Nonexudative age-related macular degeneration, right eye, early dry stage: Secondary | ICD-10-CM | POA: Diagnosis not present

## 2020-06-27 DIAGNOSIS — H52203 Unspecified astigmatism, bilateral: Secondary | ICD-10-CM | POA: Diagnosis not present

## 2020-07-03 ENCOUNTER — Ambulatory Visit: Payer: Medicare Other

## 2020-07-12 ENCOUNTER — Other Ambulatory Visit: Payer: Self-pay | Admitting: Internal Medicine

## 2020-07-15 ENCOUNTER — Ambulatory Visit (INDEPENDENT_AMBULATORY_CARE_PROVIDER_SITE_OTHER): Payer: Medicare Other | Admitting: Internal Medicine

## 2020-07-15 ENCOUNTER — Encounter: Payer: Self-pay | Admitting: Internal Medicine

## 2020-07-15 ENCOUNTER — Other Ambulatory Visit: Payer: Self-pay

## 2020-07-15 DIAGNOSIS — I10 Essential (primary) hypertension: Secondary | ICD-10-CM

## 2020-07-15 DIAGNOSIS — F411 Generalized anxiety disorder: Secondary | ICD-10-CM

## 2020-07-15 DIAGNOSIS — L509 Urticaria, unspecified: Secondary | ICD-10-CM

## 2020-07-15 MED ORDER — TRIAMCINOLONE ACETONIDE 0.1 % EX CREA: 1.0000 "application " | TOPICAL_CREAM | Freq: Three times a day (TID) | CUTANEOUS | 1 refills | Status: DC

## 2020-07-15 MED ORDER — LORATADINE 10 MG PO TABS: 10.0000 mg | ORAL_TABLET | Freq: Every day | ORAL | 5 refills | Status: DC

## 2020-07-15 NOTE — Progress Notes (Signed)
Subjective:  Patient ID: Vickie Wilson, female    DOB: Apr 22, 1931  Age: 86 y.o. MRN: [id number]  CC: Rash (Pt states she has this rash on her neck that keeps coming back. Ok today, but normally it itches)   HPI Vickie Wilson presents for neck rash off and on x weeks or months F/u HTN, anxiety   Outpatient Medications Prior to Visit  Medication Sig Dispense Refill  . acetaminophen (TYLENOL 8 HOUR) 650 MG CR tablet Take 1 tablet (650 mg total) by mouth every 8 (eight) hours as needed for pain. 100 tablet 3  . amLODipine (NORVASC) 5 MG tablet Take 1 tablet (5 mg total) by mouth daily. Overdue for Annual appt must see provider for future refills 30 tablet 0  . atenolol (TENORMIN) 100 MG tablet TAKE (1/2) TABLET TWICE DAILY. 90 tablet 3  . Cholecalciferol (VITAMIN D3) 50 MCG (2000 UT) capsule Take 1 capsule (2,000 Units total) by mouth daily. 100 capsule 3  . levETIRAcetam (KEPPRA) 750 MG tablet Take 1 tablet (750 mg total) by mouth 2 (two) times daily. 180 tablet 3  . Multiple Vitamins-Minerals (CENTRUM SILVER) CHEW Chew 1 each by mouth 3 (three) times a week.     Marland Kitchen. PARoxetine (PAXIL) 10 MG tablet TAKE 1 TABLET IN THE MORNING. 90 tablet 1  . rosuvastatin (CRESTOR) 20 MG tablet TAKE 1/2 TABLET MONDAY, WEDNESDAY & FRIDAY. 19 tablet 2   No facility-administered medications prior to visit.    ROS: Review of Systems  Constitutional: Negative for activity change, appetite change, chills, fatigue and unexpected weight change.  HENT: Negative for congestion, mouth sores and sinus pressure.   Eyes: Negative for visual disturbance.  Respiratory: Negative for cough and chest tightness.   Gastrointestinal: Negative for abdominal pain and nausea.  Genitourinary: Negative for difficulty urinating, frequency and vaginal pain.  Musculoskeletal: Negative for back pain and gait problem.  Skin: Positive for rash. Negative for pallor.  Neurological: Negative for dizziness, tremors, weakness, numbness  and headaches.  Psychiatric/Behavioral: Negative for confusion and sleep disturbance.    Objective:  BP (!) 148/82 (BP Location: Left Arm)   Pulse (!) 58   Temp 98.2 F (36.8 C) (Oral)   Wt 108 lb 9.6 oz (49.3 kg)   SpO2 98%   BMI 21.93 kg/m   BP Readings from Last 3 Encounters:  07/15/20 (!) 148/82  05/01/20 140/78  03/27/20 130/70    Wt Readings from Last 3 Encounters:  07/15/20 108 lb 9.6 oz (49.3 kg)  05/01/20 111 lb (50.3 kg)  03/27/20 112 lb (50.8 kg)    Physical Exam Constitutional:      General: She is not in acute distress.    Appearance: She is well-developed.  HENT:     Head: Normocephalic.     Right Ear: External ear normal.     Left Ear: External ear normal.     Nose: Nose normal.  Eyes:     General:        Right eye: No discharge.        Left eye: No discharge.     Conjunctiva/sclera: Conjunctivae normal.     Pupils: Pupils are equal, round, and reactive to light.  Neck:     Thyroid: No thyromegaly.     Vascular: No JVD.     Trachea: No tracheal deviation.  Cardiovascular:     Rate and Rhythm: Normal rate and regular rhythm.     Heart sounds: Normal heart sounds.  Pulmonary:  Effort: No respiratory distress.     Breath sounds: No stridor. No wheezing.  Abdominal:     General: Bowel sounds are normal. There is no distension.     Palpations: Abdomen is soft. There is no mass.     Tenderness: There is no abdominal tenderness. There is no guarding or rebound.  Musculoskeletal:        General: No tenderness.     Cervical back: Normal range of motion and neck supple.  Lymphadenopathy:     Cervical: No cervical adenopathy.  Skin:    Findings: No erythema or rash.  Neurological:     Cranial Nerves: No cranial nerve deficit.     Motor: No abnormal muscle tone.     Coordination: Coordination normal.     Deep Tendon Reflexes: Reflexes normal.  Psychiatric:        Behavior: Behavior normal.        Thought Content: Thought content normal.         Judgment: Judgment normal.    No rash now    A total time of >25 minutes was spent preparing to see the patient, reviewing tests, x-rays, operative reports and outside records.  Also, obtaining history and performing comprehensive physical exam.  Additionally, counseling the patient regarding the above listed issues.   Finally, documenting clinical information in the health records, coordination of care, educating the patient re: hives  Lab Results  Component Value Date   WBC 7.8 02/22/2019   HGB 12.5 02/22/2019   HCT 38.0 02/22/2019   PLT 324.0 02/22/2019   GLUCOSE 96 08/22/2019   CHOL 205 (H) 05/18/2019   TRIG 127.0 05/18/2019   HDL 54.30 05/18/2019   LDLDIRECT 125.6 01/16/2013   LDLCALC 125 (H) 05/18/2019   ALT 10 05/18/2019   AST 22 05/18/2019   NA 136 08/22/2019   K 4.4 08/22/2019   CL 101 08/22/2019   CREATININE 1.08 08/22/2019   BUN 16 08/22/2019   CO2 27 08/22/2019   TSH 1.93 02/22/2019   INR 0.92 04/22/2018   HGBA1C 6.1 08/22/2019   MICROALBUR 2.8 (H) 09/17/2016    CT ANGIO HEAD W OR WO CONTRAST  Result Date: 04/23/2018 CLINICAL DATA:  Subarachnoid hemorrhage, proven, follow-up. EXAM: CT ANGIOGRAPHY HEAD AND NECK TECHNIQUE: Multidetector CT imaging of the head and neck was performed using the standard protocol during bolus administration of intravenous contrast. Multiplanar CT image reconstructions and MIPs were obtained to evaluate the vascular anatomy. Carotid stenosis measurements (when applicable) are obtained utilizing NASCET criteria, using the distal internal carotid diameter as the denominator. CONTRAST:  50mL ISOVUE-370 IOPAMIDOL (ISOVUE-370) INJECTION 76% COMPARISON:  The of conservatively or FINDINGS: CT HEAD FINDINGS Brain: Subarachnoid hemorrhage within the right middle frontal sulcus is stable. No new areas of hemorrhage are present. Atrophy and white matter disease is noted bilaterally. No mass lesion is present. No significant cortical infarct is  present. Vascular: Atherosclerotic calcifications are present within the cavernous internal carotid arteries. There is no hyperdense vessel. Skull: Calvarium is intact. No focal lytic or blastic lesions are present. Asymmetric degenerative changes are present at the right TMJ. Sinuses: The paranasal sinuses and mastoid air cells are clear. Orbits: Left lens replacement is present. Globes and orbits are otherwise within normal limits. Review of the MIP images confirms the above findings CTA NECK FINDINGS Aortic arch: A 3 vessel arch configuration is present. No significant stenosis or aneurysm is present. Right carotid system: The right common carotid artery is within normal limits. Dense atherosclerotic  calcifications are present at the bifurcation without a significant stenosis relative to the more distal vessel. Left carotid system: The left common carotid artery is within normal limits. Atherosclerotic calcifications are present at the bifurcation without a significant luminal stenosis relative to the more distal vessel. Vertebral arteries: The vertebral arteries are codominant. Both vertebral arteries originate from the subclavian arteries without a significant stenosis. There is some tortuosity of the V1 segments bilaterally. No significant stenosis or vessel injury is present to either vertebral artery in the neck. Skeleton: Degenerative anterolisthesis is present at C2-3, C3-4, and C4-5. There is chronic loss of disc height with endplate sclerosis at C5-6 and C6-7. Marked lucency surrounds the roots of the residual right maxillary molar. Dental caries are present without periapical disease. Other neck: The soft tissues the neck are otherwise unremarkable. No significant adenopathy is present. No mucosal lesions are evident. Salivary glands are within normal limits bilaterally. Thyroid is normal. Upper chest: Centrilobular emphysematous changes are present bilaterally. No focal nodule or mass lesion is  present. Review of the MIP images confirms the above findings CTA HEAD FINDINGS Anterior circulation: Atherosclerotic calcifications are present within the cavernous internal carotid arteries bilaterally. There is no hyperdense vessel. 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 No definite posterior communicating artery is present. The A1 and M1 segments are normal. The anterior communicating artery is patent. MCA bifurcations are within normal limits bilaterally. The ACA and MCA branch vessels are normal. No focal vascular lesion is evident at the location of the subarachnoid hemorrhage over the right frontal convexity. Posterior circulation: Vertebral arteries are within normal limits bilaterally. PICA origins are visualized and normal. The basilar artery is normal. Both posterior cerebral arteries originate from the basilar tip. PCA branch vessels are within normal limits bilaterally. Venous sinuses: The dural sinuses are patent. Straight sinus and deep cerebral veins are intact. Cortical veins are normal. Anatomic variants: None Delayed phase: Delayed images demonstrate no pathologic enhancement. White matter disease is accentuated. Review of the MIP images confirms the above findings IMPRESSION: 1. No acute or focal vascular lesion to explain subarachnoid hemorrhage over the convexity of the right frontal lobe. 2. No mass lesion. 3. Stable atrophy and white matter disease. 4. 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 Recommend follow-up CTA of the head in 1 year to assess stability. This aneurysm does not correspond with the area of subarachnoid hemorrhage. Electronically Signed   By: Marin Roberts M.D.   On: 04/23/2018 19:28   DG Chest 2 View  Result Date: 04/22/2018 CLINICAL DATA:  Intermittent LEFT arm numbness for 2 weeks, history hypertension, diabetes mellitus, anxiety, former smoker EXAM: CHEST - 2 VIEW COMPARISON:   02/12/2018 FINDINGS: Normal heart size, mediastinal contours, and pulmonary vascularity. Atherosclerotic calcification aorta. Lungs hyperinflated with minimal subsegmental atelectasis at LEFT costophrenic angle. Remaining lungs clear. No acute infiltrate, pleural effusion or pneumothorax. Bones appear demineralized with old healed fracture of the posterolateral RIGHT sixth rib. IMPRESSION: Hyperinflated lungs with minimal subsegmental atelectasis at LEFT costophrenic angle. Electronically Signed   By: Ulyses Southward M.D.   On: 04/22/2018 11:07   CT HEAD WO CONTRAST  Result Date: 04/22/2018 CLINICAL DATA:  Left arm numbness. EXAM: CT HEAD WITHOUT CONTRAST CT CERVICAL SPINE WITHOUT CONTRAST TECHNIQUE: Multidetector CT imaging of the head and cervical spine was performed following the standard protocol without intravenous contrast. Multiplanar CT image reconstructions of the cervical spine were also generated. COMPARISON:  None.  FINDINGS: CT HEAD FINDINGS Brain: Mild diffuse cortical atrophy is noted. Mild chronic ischemic white matter disease is noted. No mass effect or midline shift is noted. Ventricular size is within normal limits. There is no evidence of mass lesion, hemorrhage or acute infarction. Vascular: No hyperdense vessel or unexpected calcification. Skull: Normal. Negative for fracture or focal lesion. Sinuses/Orbits: No acute finding. Other: None. CT CERVICAL SPINE FINDINGS Alignment: Grade 1 anterolisthesis of C3-4 C4-5 is noted secondary to posterior facet joint hypertrophy. Skull base and vertebrae: No acute fracture. No primary bone lesion or focal pathologic process. Soft tissues and spinal canal: No prevertebral fluid or swelling. No visible canal hematoma. Disc levels: Severe degenerative disc disease is noted at C5-6 and C6-7 with anterior osteophyte formation. Mild degenerative disc disease is noted at C4-5 and C7-T1. Upper chest: Negative. Other: Degenerative changes seen involving posterior  facet joints bilaterally, right greater than left. IMPRESSION: Mild diffuse cortical atrophy. Mild chronic ischemic white matter disease. No acute intracranial abnormality seen. Multilevel degenerative disc disease is noted in the cervical spine. No fracture or other acute abnormality is noted. Electronically Signed   By: Lupita Raider, M.D.   On: 04/22/2018 11:09   CT ANGIO NECK W OR WO CONTRAST  Result Date: 04/23/2018 CLINICAL DATA:  Subarachnoid hemorrhage, proven, follow-up. EXAM: CT ANGIOGRAPHY HEAD AND NECK TECHNIQUE: Multidetector CT imaging of the head and neck was performed using the standard protocol during bolus administration of intravenous contrast. Multiplanar CT image reconstructions and MIPs were obtained to evaluate the vascular anatomy. Carotid stenosis measurements (when applicable) are obtained utilizing NASCET criteria, using the distal internal carotid diameter as the denominator. CONTRAST:  50mL ISOVUE-370 IOPAMIDOL (ISOVUE-370) INJECTION 76% COMPARISON:  The of conservatively or FINDINGS: CT HEAD FINDINGS Brain: Subarachnoid hemorrhage within the right middle frontal sulcus is stable. No new areas of hemorrhage are present. Atrophy and white matter disease is noted bilaterally. No mass lesion is present. No significant cortical infarct is present. Vascular: Atherosclerotic calcifications are present within the cavernous internal carotid arteries. There is no hyperdense vessel. Skull: Calvarium is intact. No focal lytic or blastic lesions are present. Asymmetric degenerative changes are present at the right TMJ. Sinuses: The paranasal sinuses and mastoid air cells are clear. Orbits: Left lens replacement is present. Globes and orbits are otherwise within normal limits. Review of the MIP images confirms the above findings CTA NECK FINDINGS Aortic arch: A 3 vessel arch configuration is present. No significant stenosis or aneurysm is present. Right carotid system: The right common carotid  artery is within normal limits. Dense atherosclerotic calcifications are present at the bifurcation without a significant stenosis relative to the more distal vessel. Left carotid system: The left common carotid artery is within normal limits. Atherosclerotic calcifications are present at the bifurcation without a significant luminal stenosis relative to the more distal vessel. Vertebral arteries: The vertebral arteries are codominant. Both vertebral arteries originate from the subclavian arteries without a significant stenosis. There is some tortuosity of the V1 segments bilaterally. No significant stenosis or vessel injury is present to either vertebral artery in the neck. Skeleton: Degenerative anterolisthesis is present at C2-3, C3-4, and C4-5. There is chronic loss of disc height with endplate sclerosis at C5-6 and C6-7. Marked lucency surrounds the roots of the residual right maxillary molar. Dental caries are present without periapical disease. Other neck: The soft tissues the neck are otherwise unremarkable. No significant adenopathy is present. No mucosal lesions are evident. Salivary glands are within normal limits  bilaterally. Thyroid is normal. Upper chest: Centrilobular emphysematous changes are present bilaterally. No focal nodule or mass lesion is present. Review of the MIP images confirms the above findings CTA HEAD FINDINGS Anterior circulation: Atherosclerotic calcifications are present within the cavernous internal carotid arteries bilaterally. There is no hyperdense vessel. 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 No definite posterior communicating artery is present. The A1 and M1 segments are normal. The anterior communicating artery is patent. MCA bifurcations are within normal limits bilaterally. The ACA and MCA branch vessels are normal. No focal vascular lesion is evident at the location of the subarachnoid hemorrhage over the right frontal convexity.  Posterior circulation: Vertebral arteries are within normal limits bilaterally. PICA origins are visualized and normal. The basilar artery is normal. Both posterior cerebral arteries originate from the basilar tip. PCA branch vessels are within normal limits bilaterally. Venous sinuses: The dural sinuses are patent. Straight sinus and deep cerebral veins are intact. Cortical veins are normal. Anatomic variants: None Delayed phase: Delayed images demonstrate no pathologic enhancement. White matter disease is accentuated. Review of the MIP images confirms the above findings IMPRESSION: 1. No acute or focal vascular lesion to explain subarachnoid hemorrhage over the convexity of the right frontal lobe. 2. No mass lesion. 3. Stable atrophy and white matter disease. 4. 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 Recommend follow-up CTA of the head in 1 year to assess stability. This aneurysm does not correspond with the area of subarachnoid hemorrhage. Electronically Signed   By: Marin Roberts M.D.   On: 04/23/2018 19:28   CT Cervical Spine Wo Contrast  Result Date: 04/22/2018 CLINICAL DATA:  Left arm numbness. EXAM: CT HEAD WITHOUT CONTRAST CT CERVICAL SPINE WITHOUT CONTRAST TECHNIQUE: Multidetector CT imaging of the head and cervical spine was performed following the standard protocol without intravenous contrast. Multiplanar CT image reconstructions of the cervical spine were also generated. COMPARISON:  None. FINDINGS: CT HEAD FINDINGS Brain: Mild diffuse cortical atrophy is noted. Mild chronic ischemic white matter disease is noted. No mass effect or midline shift is noted. Ventricular size is within normal limits. There is no evidence of mass lesion, hemorrhage or acute infarction. Vascular: No hyperdense vessel or unexpected calcification. Skull: Normal. Negative for fracture or focal lesion. Sinuses/Orbits: No acute finding. Other: None. CT CERVICAL SPINE FINDINGS  Alignment: Grade 1 anterolisthesis of C3-4 C4-5 is noted secondary to posterior facet joint hypertrophy. Skull base and vertebrae: No acute fracture. No primary bone lesion or focal pathologic process. Soft tissues and spinal canal: No prevertebral fluid or swelling. No visible canal hematoma. Disc levels: Severe degenerative disc disease is noted at C5-6 and C6-7 with anterior osteophyte formation. Mild degenerative disc disease is noted at C4-5 and C7-T1. Upper chest: Negative. Other: Degenerative changes seen involving posterior facet joints bilaterally, right greater than left. IMPRESSION: Mild diffuse cortical atrophy. Mild chronic ischemic white matter disease. No acute intracranial abnormality seen. Multilevel degenerative disc disease is noted in the cervical spine. No fracture or other acute abnormality is noted. Electronically Signed   By: Lupita Raider, M.D.   On: 04/22/2018 11:09   MR Brain Wo Contrast (neuro protocol)  Result Date: 04/22/2018 CLINICAL DATA:  Numbness or tingling, paresthesia. Intermittent left arm numbness for 2 weeks EXAM: MRI HEAD WITHOUT CONTRAST TECHNIQUE: Multiplanar, multiecho pulse sequences of the brain and surrounding structures were obtained without intravenous contrast. COMPARISON:  CT from earlier today FINDINGS: Brain: Small focus  of subarachnoid FLAIR hyperintensity and gradient hypointensity along the precentral gyrus on the right, this area is high-density by CT. No other site of hemorrhage seen, either acute or chronic. There is mild for age chronic small vessel ischemia in the cerebral white matter. Mild cerebral volume loss. Clustered appearance of gyri at the left vertex, nonspecific and likely noncontributory. No underlying mass is seen and there is no extrinsic mass effect by collection. No hydrocephalus. Vascular: Major flow voids are preserved Skull and upper cervical spine: No evidence of marrow lesion. Diffuse cervical facet spurring with mild C3-4 and  C4-5 anterolisthesis. Sinuses/Orbits: Left cataract resection. Other: These results were called by telephone at the time of interpretation on 04/22/2018 at 2:10 pm to Dr. Samuel Jester , who verbally acknowledged these results. IMPRESSION: 1. Small volume subarachnoid hemorrhage along the high right frontal convexity. Trauma, coagulopathy, or vasculopathy/amyloid are primary considerations in this location. There is normal superior sagittal sinus flow voids. No acute or remote hemorrhage seen elsewhere. 2. Mild atrophy and chronic small vessel ischemia for age. Electronically Signed   By: Marnee Spring M.D.   On: 04/22/2018 14:14   EEG adult  Result Date: 04/23/2018 Rejeana Brock, MD     04/23/2018  1:34 PM History: 86 yo F with intermittent numbness Sedation: None Technique: This is a 21 channel routine scalp EEG performed at the bedside with bipolar and monopolar montages arranged in accordance to the international 10/20 system of electrode placement. One channel was dedicated to EKG recording. Background: The background consists of intermixed alpha and beta activities. There is a well defined posterior dominant rhythm of 9-10 Hz that attenuates with eye opening. Sleep is not recorded. Photic stimulation: Physiologic driving is not performed EEG Abnormalities: None Clinical Interpretation: This normal EEG is recorded in the waking state. There was no seizure or seizure predisposition recorded on this study. Please note that a normal EEG does not preclude the possibility of epilepsy. Ritta Slot, MD [facility] [phone] If 7pm- 7am, please page neurology on call as listed in AMION.   ECHOCARDIOGRAM COMPLETE  Result Date: 04/23/2018                            [facility]*                   *[facility]*                         [address]                        [place], [place] [address]                            [phone]  ------------------------------------------------------------------- Transthoracic Echocardiography Patient:    Effie, Wahlert MR #:       [id number] Study Date: 04/23/2018 Gender:     F Age:        87 Height:     154.9 cm Weight:     50.6 kg BSA:        1.48 m^2 Pt. Status: Room:       [id number]  ATTENDING    Samuel Jester [id number]  PERFORMING   [facility], Inpatient  ADMITTING    Emokpae, Courage  Joya San, Courage  REFERRING  Mariea Clonts, Courage  SONOGRAPHER  Belva Chimes cc: ------------------------------------------------------------------- LV EF: 55% -   60% ------------------------------------------------------------------- Indications:      TIA 435.9. ------------------------------------------------------------------- Study Conclusions - Left ventricle: The cavity size was normal. Wall thickness was   normal. Systolic function was normal. The estimated ejection   fraction was in the range of 55% to 60%. Wall motion was normal;   there were no regional wall motion abnormalities. Doppler   parameters are consistent with abnormal left ventricular   relaxation (grade 1 diastolic dysfunction). - Aortic valve: Valve area (VTI): 2.42 cm^2. Valve area (Vmax):   2.38 cm^2. Valve area (Vmean): 2.23 cm^2. ------------------------------------------------------------------- Study data:  Comparison was made to the study of 03/02/2018.  Study status:  Routine.  Procedure:  Transthoracic echocardiography. Image quality was good.  Study completion:  There were no complications.          Transthoracic echocardiography.  2D, spectral Doppler, and color Doppler.  Birthdate:  Patient birthdate: Aug 25, 1931.  Age:  Patient is 86 yr old.  Sex:  Gender: female.    BMI: 21.1 kg/m^2.  Blood pressure:     131/72  Patient status:  Inpatient.  Study date:  Study date: 04/23/2018. Study time: 02:16 PM.  Location:  Bedside. -------------------------------------------------------------------  ------------------------------------------------------------------- Left ventricle:  The cavity size was normal. Wall thickness was normal. Systolic function was normal. The estimated ejection fraction was in the range of 55% to 60%. Wall motion was normal; there were no regional wall motion abnormalities. Doppler parameters are consistent with abnormal left ventricular relaxation (grade 1 diastolic dysfunction). ------------------------------------------------------------------- Aortic valve:   Mildly calcified leaflets. Cusp separation was normal.  Doppler:  Transvalvular velocity was within the normal range. There was no stenosis. There was no regurgitation.    VTI ratio of LVOT to aortic valve: 0.77. Valve area (VTI): 2.42 cm^2. Indexed valve area (VTI): 1.64 cm^2/m^2. Peak velocity ratio of LVOT to aortic valve: 0.76. Valve area (Vmax): 2.38 cm^2. Indexed valve area (Vmax): 1.61 cm^2/m^2. Mean velocity ratio of LVOT to aortic valve: 0.71. Valve area (Vmean): 2.23 cm^2. Indexed valve area (Vmean): 1.51 cm^2/m^2.    Mean gradient (S): 3 mm Hg. Peak gradient (S): 5 mm Hg. ------------------------------------------------------------------- Aorta:  Aortic root: The aortic root was normal in size. Ascending aorta: The ascending aorta was normal in size. ------------------------------------------------------------------- Mitral valve:   Structurally normal valve.   Leaflet separation was normal.  Doppler:  Transvalvular velocity was within the normal range. There was no evidence for stenosis. There was no regurgitation. ------------------------------------------------------------------- Left atrium:  The atrium was normal in size. ------------------------------------------------------------------- Right ventricle:  The cavity size was normal. Wall thickness was normal. Systolic function was normal. ------------------------------------------------------------------- Pulmonic valve:   Poorly visualized.  Doppler:  There  was no significant regurgitation. ------------------------------------------------------------------- Tricuspid valve:   Structurally normal valve.   Leaflet separation was normal.  Doppler:  Transvalvular velocity was within the normal range. There was mild regurgitation. ------------------------------------------------------------------- Right atrium:  The atrium was normal in size. ------------------------------------------------------------------- Pericardium:  There was no pericardial effusion. ------------------------------------------------------------------- Systemic veins: Inferior vena cava: The vessel was normal in size. The respirophasic diameter changes were in the normal range (>= 50%), consistent with normal central venous pressure. ------------------------------------------------------------------- Measurements  Left ventricle                           Value          Reference  LV ID, ED, PLAX chordal          (  L)     42.7  mm       43 - 52  LV ID, ES, PLAX chordal                  29.4  mm       23 - 38  LV fx shortening, PLAX chordal           31    %        >=29  LV PW thickness, ED                      4.55  mm       ----------  IVS/LV PW ratio, ED              (H)     1.58           <=1.3  Stroke volume, 2D                        65    ml       ----------  Stroke volume/bsa, 2D                    44    ml/m^2   ----------  LV e&', lateral                           5.87  cm/s     ----------  LV E/e&', lateral                         10.75          ----------  LV s&', lateral                           6.42  cm/s     ----------  LV e&', medial                            4.24  cm/s     ----------  LV E/e&', medial                          14.88          ----------  LV e&', average                           5.06  cm/s     ----------  LV E/e&', average                         12.48          ----------   Ventricular septum                       Value          Reference  IVS thickness, ED                         7.2   mm       ----------   LVOT  Value          Reference  LVOT ID, S                               20    mm       ----------  LVOT area                                3.14  cm^2     ----------  LVOT peak velocity, S                    87.9  cm/s     ----------  LVOT mean velocity, S                    58.4  cm/s     ----------  LVOT VTI, S                              20.7  cm       ----------   Aortic valve                             Value          Reference  Aortic valve peak velocity, S            116   cm/s     ----------  Aortic valve mean velocity, S            82.4  cm/s     ----------  Aortic valve VTI, S                      26.9  cm       ----------  Aortic mean gradient, S                  3     mm Hg    ----------  Aortic peak gradient, S                  5     mm Hg    ----------  VTI ratio, LVOT/AV                       0.77           ----------  Aortic valve area, VTI                   2.42  cm^2     ----------  Aortic valve area/bsa, VTI               1.64  cm^2/m^2 ----------  Velocity ratio, peak, LVOT/AV            0.76           ----------  Aortic valve area, peak velocity         2.38  cm^2     ----------  Aortic valve area/bsa, peak              1.61  cm^2/m^2 ----------  velocity  Velocity ratio, mean, LVOT/AV            0.71           ----------  Aortic valve area,  mean velocity         2.23  cm^2     ----------  Aortic valve area/bsa, mean              1.51  cm^2/m^2 ----------  velocity   Aorta                                    Value          Reference  Aortic root ID, ED                       27    mm       ----------   Left atrium                              Value          Reference  LA ID, A-P, ES                           25    mm       ----------  LA ID/bsa, A-P                           1.69  cm/m^2   <=2.2  LA volume, S                             38.7  ml       ----------  LA volume/bsa, S                         26.2  ml/m^2    ----------  LA volume, ES, 1-p A4C                   31.7  ml       ----------  LA volume/bsa, ES, 1-p A4C               21.5  ml/m^2   ----------  LA volume, ES, 1-p A2C                   45.7  ml       ----------  LA volume/bsa, ES, 1-p A2C               30.9  ml/m^2   ----------   Mitral valve                             Value          Reference  Mitral E-wave peak velocity              63.1  cm/s     ----------  Mitral A-wave peak velocity              95.1  cm/s     ----------  Mitral deceleration time         (H)     363   ms       150 - 230  Mitral E/A ratio, peak                   0.7            ----------  Pulmonary veins                          Value          Reference  Pulmonary vein peak velocity, S          50    cm/s     ----------  Pulmonary vein peak velocity, D          28    cm/s     ----------  Pulmonary vein velocity ratio,           1.8            ----------  peak, S/D   Pulmonary arteries                       Value          Reference  PA pressure, S, DP                       27    mm Hg    <=30   Tricuspid valve                          Value          Reference  Tricuspid regurg peak velocity           247   cm/s     ----------  Tricuspid peak RV-RA gradient            24    mm Hg    ----------   Right atrium                             Value          Reference  RA ID, S-I, ES, A4C              (H)     49.8  mm       34 - 49  RA area, ES, A4C                         13.2  cm^2     8.3 - 19.5  RA volume, ES, A/L                       29    ml       ----------  RA volume/bsa, ES, A/L                   19.6  ml/m^2   ----------   Systemic veins                           Value          Reference  Estimated CVP                            3     mm Hg    ----------   Right ventricle                          Value          Reference  RV ID, ED, PLAX  33.1  mm       19 - 38  TAPSE                                    19.2  mm       ----------  RV pressure, S, DP                        27    mm Hg    <=30  RV s&', lateral, S                        10.4  cm/s     ----------   Pulmonic valve                           Value          Reference  Pulmonic valve peak velocity, S          84.2  cm/s     ---------- Legend: (L)  and  (H)  mark values outside specified reference range. ------------------------------------------------------------------- Prepared and Electronically Authenticated by Nicholes Mango, MD 2019-08-10T15:35:38   Assessment & Plan:     Follow-up: No follow-ups on file.  Sonda Primes, MD

## 2020-07-15 NOTE — Assessment & Plan Note (Signed)
Paxil 

## 2020-07-15 NOTE — Assessment & Plan Note (Addendum)
Mild Loratidine Triamc cream Look into meds if not better Derm ref

## 2020-07-15 NOTE — Assessment & Plan Note (Signed)
On Norvasc, Atenolol

## 2020-07-18 ENCOUNTER — Ambulatory Visit: Payer: Medicare Other

## 2020-08-14 ENCOUNTER — Other Ambulatory Visit: Payer: Self-pay | Admitting: Internal Medicine

## 2020-09-05 ENCOUNTER — Telehealth: Payer: Self-pay | Admitting: Internal Medicine

## 2020-09-05 NOTE — Telephone Encounter (Signed)
Copied from CRM [phone]. Topic: Medicare AWV >> Sep 05, 2020  2:38 PM Claudette Laws R wrote: Reason for CRM:  Left message for patient to call back and schedule Medicare Annual Wellness Visit (AWV) in office.   If not able to come in office, please offer to do virtually.   45 minute appointment  Last AWV 10/05/2018  Please schedule at anytime with the Nurse Health Advisor.

## 2020-09-12 ENCOUNTER — Other Ambulatory Visit: Payer: Self-pay | Admitting: Internal Medicine

## 2020-09-19 ENCOUNTER — Other Ambulatory Visit: Payer: Self-pay

## 2020-09-19 ENCOUNTER — Ambulatory Visit (INDEPENDENT_AMBULATORY_CARE_PROVIDER_SITE_OTHER): Payer: Medicare Other | Admitting: Internal Medicine

## 2020-09-19 ENCOUNTER — Encounter: Payer: Self-pay | Admitting: Internal Medicine

## 2020-09-19 VITALS — BP 112/68 | HR 57 | Temp 97.7°F | Wt 108.0 lb

## 2020-09-19 DIAGNOSIS — R569 Unspecified convulsions: Secondary | ICD-10-CM

## 2020-09-19 DIAGNOSIS — E785 Hyperlipidemia, unspecified: Secondary | ICD-10-CM | POA: Diagnosis not present

## 2020-09-19 DIAGNOSIS — Z23 Encounter for immunization: Secondary | ICD-10-CM

## 2020-09-19 DIAGNOSIS — I10 Essential (primary) hypertension: Secondary | ICD-10-CM

## 2020-09-19 DIAGNOSIS — G459 Transient cerebral ischemic attack, unspecified: Secondary | ICD-10-CM

## 2020-09-19 NOTE — Assessment & Plan Note (Signed)
On Crestor 

## 2020-09-19 NOTE — Progress Notes (Signed)
Subjective:  Patient ID: Vickie Wilson, female    DOB: 03-30-1931  Age: 87 y.o. MRN: [id number]  CC: Follow-up (2 month f/u- Flu shot)   HPI Vickie Wilson presents for HTN, depression - worse, she is stressd out. F/u on seizure disorder.  COVID-19 isolation has been depressing for Vickie Wilson  Outpatient Medications Prior to Visit  Medication Sig Dispense Refill  . acetaminophen (TYLENOL 8 HOUR) 650 MG CR tablet Take 1 tablet (650 mg total) by mouth every 8 (eight) hours as needed for pain. 100 tablet 3  . amLODipine (NORVASC) 5 MG tablet TAKE 1 TABLET ONCE DAILY. 30 tablet 0  . atenolol (TENORMIN) 100 MG tablet TAKE (1/2) TABLET TWICE DAILY. 90 tablet 3  . Cholecalciferol (VITAMIN D3) 50 MCG (2000 UT) capsule Take 1 capsule (2,000 Units total) by mouth daily. 100 capsule 3  . levETIRAcetam (KEPPRA) 750 MG tablet Take 1 tablet (750 mg total) by mouth 2 (two) times daily. 180 tablet 3  . loratadine (CLARITIN) 10 MG tablet Take 1 tablet (10 mg total) by mouth daily. 30 tablet 5  . Multiple Vitamins-Minerals (CENTRUM SILVER) CHEW Chew 1 each by mouth 3 (three) times a week.    Marland Kitchen PARoxetine (PAXIL) 10 MG tablet TAKE 1 TABLET IN THE MORNING. 90 tablet 1  . rosuvastatin (CRESTOR) 20 MG tablet TAKE 1/2 TABLET MONDAY, WEDNESDAY & FRIDAY. 19 tablet 2  . triamcinolone cream (KENALOG) 0.1 % Apply 1 application topically 3 (three) times daily. 80 g 1   No facility-administered medications prior to visit.    ROS: Review of Systems  Constitutional: Negative for activity change, appetite change, chills, fatigue and unexpected weight change.  HENT: Negative for congestion, mouth sores and sinus pressure.   Eyes: Negative for visual disturbance.  Respiratory: Negative for cough and chest tightness.   Gastrointestinal: Negative for abdominal pain and nausea.  Genitourinary: Negative for difficulty urinating, frequency and vaginal pain.  Musculoskeletal: Negative for back pain and gait problem.  Skin:  Negative for pallor and rash.  Neurological: Negative for dizziness, tremors, weakness, numbness and headaches.  Psychiatric/Behavioral: Positive for dysphoric mood. Negative for confusion, sleep disturbance and suicidal ideas. The patient is nervous/anxious.     Objective:  BP 112/68 (BP Location: Left Arm)   Pulse (!) 57   Temp 97.7 F (36.5 C) (Oral)   Wt 108 lb (49 kg)   SpO2 96%   BMI 21.81 kg/m   BP Readings from Last 3 Encounters:  09/19/20 112/68  07/15/20 (!) 148/82  05/01/20 140/78    Wt Readings from Last 3 Encounters:  09/19/20 108 lb (49 kg)  07/15/20 108 lb 9.6 oz (49.3 kg)  05/01/20 111 lb (50.3 kg)    Physical Exam Constitutional:      General: She is not in acute distress.    Appearance: She is well-developed.  HENT:     Head: Normocephalic.     Right Ear: External ear normal.     Left Ear: External ear normal.     Nose: Nose normal.     Mouth/Throat:     Mouth: Oropharynx is clear and moist.  Eyes:     General:        Right eye: No discharge.        Left eye: No discharge.     Conjunctiva/sclera: Conjunctivae normal.     Pupils: Pupils are equal, round, and reactive to light.  Neck:     Thyroid: No thyromegaly.  Vascular: No JVD.     Trachea: No tracheal deviation.  Cardiovascular:     Rate and Rhythm: Normal rate and regular rhythm.     Heart sounds: Normal heart sounds.  Pulmonary:     Effort: No respiratory distress.     Breath sounds: No stridor. No wheezing.  Abdominal:     General: Bowel sounds are normal. There is no distension.     Palpations: Abdomen is soft. There is no mass.     Tenderness: There is no abdominal tenderness. There is no guarding or rebound.  Musculoskeletal:        General: No tenderness or edema.     Cervical back: Normal range of motion and neck supple.  Lymphadenopathy:     Cervical: No cervical adenopathy.  Skin:    Findings: No erythema or rash.  Neurological:     Mental Status: She is oriented to  person, place, and time.     Cranial Nerves: No cranial nerve deficit.     Motor: No abnormal muscle tone.     Coordination: Coordination normal.     Deep Tendon Reflexes: Reflexes normal.  Psychiatric:        Mood and Affect: Mood and affect normal.        Behavior: Behavior normal.        Thought Content: Thought content normal.        Judgment: Judgment normal.    Vickie Wilson looks younger than her stated age Lab Results  Component Value Date   WBC 7.8 02/22/2019   HGB 12.5 02/22/2019   HCT 38.0 02/22/2019   PLT 324.0 02/22/2019   GLUCOSE 96 08/22/2019   CHOL 205 (H) 05/18/2019   TRIG 127.0 05/18/2019   HDL 54.30 05/18/2019   LDLDIRECT 125.6 01/16/2013   LDLCALC 125 (H) 05/18/2019   ALT 10 05/18/2019   AST 22 05/18/2019   NA 136 08/22/2019   K 4.4 08/22/2019   CL 101 08/22/2019   CREATININE 1.08 08/22/2019   BUN 16 08/22/2019   CO2 27 08/22/2019   TSH 1.93 02/22/2019   INR 0.92 04/22/2018   HGBA1C 6.1 08/22/2019   MICROALBUR 2.8 (H) 09/17/2016    CT ANGIO HEAD W OR WO CONTRAST  Result Date: 04/23/2018 CLINICAL DATA:  Subarachnoid hemorrhage, proven, follow-up. EXAM: CT ANGIOGRAPHY HEAD AND NECK TECHNIQUE: Multidetector CT imaging of the head and neck was performed using the standard protocol during bolus administration of intravenous contrast. Multiplanar CT image reconstructions and MIPs were obtained to evaluate the vascular anatomy. Carotid stenosis measurements (when applicable) are obtained utilizing NASCET criteria, using the distal internal carotid diameter as the denominator. CONTRAST:  50mL ISOVUE-370 IOPAMIDOL (ISOVUE-370) INJECTION 76% COMPARISON:  The of conservatively or FINDINGS: CT HEAD FINDINGS Brain: Subarachnoid hemorrhage within the right middle frontal sulcus is stable. No new areas of hemorrhage are present. Atrophy and white matter disease is noted bilaterally. No mass lesion is present. No significant cortical infarct is present. Vascular: Atherosclerotic  calcifications are present within the cavernous internal carotid arteries. There is no hyperdense vessel. Skull: Calvarium is intact. No focal lytic or blastic lesions are present. Asymmetric degenerative changes are present at the right TMJ. Sinuses: The paranasal sinuses and mastoid air cells are clear. Orbits: Left lens replacement is present. Globes and orbits are otherwise within normal limits. Review of the MIP images confirms the above findings CTA NECK FINDINGS Aortic arch: A 3 vessel arch configuration is present. No significant stenosis or aneurysm is present. Right carotid  system: The right common carotid artery is within normal limits. Dense atherosclerotic calcifications are present at the bifurcation without a significant stenosis relative to the more distal vessel. Left carotid system: The left common carotid artery is within normal limits. Atherosclerotic calcifications are present at the bifurcation without a significant luminal stenosis relative to the more distal vessel. Vertebral arteries: The vertebral arteries are codominant. Both vertebral arteries originate from the subclavian arteries without a significant stenosis. There is some tortuosity of the V1 segments bilaterally. No significant stenosis or vessel injury is present to either vertebral artery in the neck. Skeleton: Degenerative anterolisthesis is present at C2-3, C3-4, and C4-5. There is chronic loss of disc height with endplate sclerosis at C5-6 and C6-7. Marked lucency surrounds the roots of the residual right maxillary molar. Dental caries are present without periapical disease. Other neck: The soft tissues the neck are otherwise unremarkable. No significant adenopathy is present. No mucosal lesions are evident. Salivary glands are within normal limits bilaterally. Thyroid is normal. Upper chest: Centrilobular emphysematous changes are present bilaterally. No focal nodule or mass lesion is present. Review of the MIP images confirms  the above findings CTA HEAD FINDINGS Anterior circulation: Atherosclerotic calcifications are present within the cavernous internal carotid arteries bilaterally. There is no hyperdense vessel. 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 No definite posterior communicating artery is present. The A1 and M1 segments are normal. The anterior communicating artery is patent. MCA bifurcations are within normal limits bilaterally. The ACA and MCA branch vessels are normal. No focal vascular lesion is evident at the location of the subarachnoid hemorrhage over the right frontal convexity. Posterior circulation: Vertebral arteries are within normal limits bilaterally. PICA origins are visualized and normal. The basilar artery is normal. Both posterior cerebral arteries originate from the basilar tip. PCA branch vessels are within normal limits bilaterally. Venous sinuses: The dural sinuses are patent. Straight sinus and deep cerebral veins are intact. Cortical veins are normal. Anatomic variants: None Delayed phase: Delayed images demonstrate no pathologic enhancement. White matter disease is accentuated. Review of the MIP images confirms the above findings IMPRESSION: 1. No acute or focal vascular lesion to explain subarachnoid hemorrhage over the convexity of the right frontal lobe. 2. No mass lesion. 3. Stable atrophy and white matter disease. 4. 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 Recommend follow-up CTA of the head in 1 year to assess stability. This aneurysm does not correspond with the area of subarachnoid hemorrhage. Electronically Signed   By: Marin Roberts M.D.   On: 04/23/2018 19:28   DG Chest 2 View  Result Date: 04/22/2018 CLINICAL DATA:  Intermittent LEFT arm numbness for 2 weeks, history hypertension, diabetes mellitus, anxiety, former smoker EXAM: CHEST - 2 VIEW COMPARISON:  02/12/2018 FINDINGS: Normal heart size,  mediastinal contours, and pulmonary vascularity. Atherosclerotic calcification aorta. Lungs hyperinflated with minimal subsegmental atelectasis at LEFT costophrenic angle. Remaining lungs clear. No acute infiltrate, pleural effusion or pneumothorax. Bones appear demineralized with old healed fracture of the posterolateral RIGHT sixth rib. IMPRESSION: Hyperinflated lungs with minimal subsegmental atelectasis at LEFT costophrenic angle. Electronically Signed   By: Ulyses Southward M.D.   On: 04/22/2018 11:07   CT HEAD WO CONTRAST  Result Date: 04/22/2018 CLINICAL DATA:  Left arm numbness. EXAM: CT HEAD WITHOUT CONTRAST CT CERVICAL SPINE WITHOUT CONTRAST TECHNIQUE: Multidetector CT imaging of the head and cervical spine was performed following the standard protocol without intravenous contrast. Multiplanar CT  image reconstructions of the cervical spine were also generated. COMPARISON:  None. FINDINGS: CT HEAD FINDINGS Brain: Mild diffuse cortical atrophy is noted. Mild chronic ischemic white matter disease is noted. No mass effect or midline shift is noted. Ventricular size is within normal limits. There is no evidence of mass lesion, hemorrhage or acute infarction. Vascular: No hyperdense vessel or unexpected calcification. Skull: Normal. Negative for fracture or focal lesion. Sinuses/Orbits: No acute finding. Other: None. CT CERVICAL SPINE FINDINGS Alignment: Grade 1 anterolisthesis of C3-4 C4-5 is noted secondary to posterior facet joint hypertrophy. Skull base and vertebrae: No acute fracture. No primary bone lesion or focal pathologic process. Soft tissues and spinal canal: No prevertebral fluid or swelling. No visible canal hematoma. Disc levels: Severe degenerative disc disease is noted at C5-6 and C6-7 with anterior osteophyte formation. Mild degenerative disc disease is noted at C4-5 and C7-T1. Upper chest: Negative. Other: Degenerative changes seen involving posterior facet joints bilaterally, right greater  than left. IMPRESSION: Mild diffuse cortical atrophy. Mild chronic ischemic white matter disease. No acute intracranial abnormality seen. Multilevel degenerative disc disease is noted in the cervical spine. No fracture or other acute abnormality is noted. Electronically Signed   By: Lupita Raider, M.D.   On: 04/22/2018 11:09   CT ANGIO NECK W OR WO CONTRAST  Result Date: 04/23/2018 CLINICAL DATA:  Subarachnoid hemorrhage, proven, follow-up. EXAM: CT ANGIOGRAPHY HEAD AND NECK TECHNIQUE: Multidetector CT imaging of the head and neck was performed using the standard protocol during bolus administration of intravenous contrast. Multiplanar CT image reconstructions and MIPs were obtained to evaluate the vascular anatomy. Carotid stenosis measurements (when applicable) are obtained utilizing NASCET criteria, using the distal internal carotid diameter as the denominator. CONTRAST:  59mL ISOVUE-370 IOPAMIDOL (ISOVUE-370) INJECTION 76% COMPARISON:  The of conservatively or FINDINGS: CT HEAD FINDINGS Brain: Subarachnoid hemorrhage within the right middle frontal sulcus is stable. No new areas of hemorrhage are present. Atrophy and white matter disease is noted bilaterally. No mass lesion is present. No significant cortical infarct is present. Vascular: Atherosclerotic calcifications are present within the cavernous internal carotid arteries. There is no hyperdense vessel. Skull: Calvarium is intact. No focal lytic or blastic lesions are present. Asymmetric degenerative changes are present at the right TMJ. Sinuses: The paranasal sinuses and mastoid air cells are clear. Orbits: Left lens replacement is present. Globes and orbits are otherwise within normal limits. Review of the MIP images confirms the above findings CTA NECK FINDINGS Aortic arch: A 3 vessel arch configuration is present. No significant stenosis or aneurysm is present. Right carotid system: The right common carotid artery is within normal limits. Dense  atherosclerotic calcifications are present at the bifurcation without a significant stenosis relative to the more distal vessel. Left carotid system: The left common carotid artery is within normal limits. Atherosclerotic calcifications are present at the bifurcation without a significant luminal stenosis relative to the more distal vessel. Vertebral arteries: The vertebral arteries are codominant. Both vertebral arteries originate from the subclavian arteries without a significant stenosis. There is some tortuosity of the V1 segments bilaterally. No significant stenosis or vessel injury is present to either vertebral artery in the neck. Skeleton: Degenerative anterolisthesis is present at C2-3, C3-4, and C4-5. There is chronic loss of disc height with endplate sclerosis at C5-6 and C6-7. Marked lucency surrounds the roots of the residual right maxillary molar. Dental caries are present without periapical disease. Other neck: The soft tissues the neck are otherwise unremarkable. No significant adenopathy is  present. No mucosal lesions are evident. Salivary glands are within normal limits bilaterally. Thyroid is normal. Upper chest: Centrilobular emphysematous changes are present bilaterally. No focal nodule or mass lesion is present. Review of the MIP images confirms the above findings CTA HEAD FINDINGS Anterior circulation: Atherosclerotic calcifications are present within the cavernous internal carotid arteries bilaterally. There is no hyperdense vessel. 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 No definite posterior communicating artery is present. The A1 and M1 segments are normal. The anterior communicating artery is patent. MCA bifurcations are within normal limits bilaterally. The ACA and MCA branch vessels are normal. No focal vascular lesion is evident at the location of the subarachnoid hemorrhage over the right frontal convexity. Posterior circulation: Vertebral  arteries are within normal limits bilaterally. PICA origins are visualized and normal. The basilar artery is normal. Both posterior cerebral arteries originate from the basilar tip. PCA branch vessels are within normal limits bilaterally. Venous sinuses: The dural sinuses are patent. Straight sinus and deep cerebral veins are intact. Cortical veins are normal. Anatomic variants: None Delayed phase: Delayed images demonstrate no pathologic enhancement. White matter disease is accentuated. Review of the MIP images confirms the above findings IMPRESSION: 1. No acute or focal vascular lesion to explain subarachnoid hemorrhage over the convexity of the right frontal lobe. 2. No mass lesion. 3. Stable atrophy and white matter disease. 4. 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 Recommend follow-up CTA of the head in 1 year to assess stability. This aneurysm does not correspond with the area of subarachnoid hemorrhage. Electronically Signed   By: Marin Roberts M.D.   On: 04/23/2018 19:28   CT Cervical Spine Wo Contrast  Result Date: 04/22/2018 CLINICAL DATA:  Left arm numbness. EXAM: CT HEAD WITHOUT CONTRAST CT CERVICAL SPINE WITHOUT CONTRAST TECHNIQUE: Multidetector CT imaging of the head and cervical spine was performed following the standard protocol without intravenous contrast. Multiplanar CT image reconstructions of the cervical spine were also generated. COMPARISON:  None. FINDINGS: CT HEAD FINDINGS Brain: Mild diffuse cortical atrophy is noted. Mild chronic ischemic white matter disease is noted. No mass effect or midline shift is noted. Ventricular size is within normal limits. There is no evidence of mass lesion, hemorrhage or acute infarction. Vascular: No hyperdense vessel or unexpected calcification. Skull: Normal. Negative for fracture or focal lesion. Sinuses/Orbits: No acute finding. Other: None. CT CERVICAL SPINE FINDINGS Alignment: Grade 1 anterolisthesis  of C3-4 C4-5 is noted secondary to posterior facet joint hypertrophy. Skull base and vertebrae: No acute fracture. No primary bone lesion or focal pathologic process. Soft tissues and spinal canal: No prevertebral fluid or swelling. No visible canal hematoma. Disc levels: Severe degenerative disc disease is noted at C5-6 and C6-7 with anterior osteophyte formation. Mild degenerative disc disease is noted at C4-5 and C7-T1. Upper chest: Negative. Other: Degenerative changes seen involving posterior facet joints bilaterally, right greater than left. IMPRESSION: Mild diffuse cortical atrophy. Mild chronic ischemic white matter disease. No acute intracranial abnormality seen. Multilevel degenerative disc disease is noted in the cervical spine. No fracture or other acute abnormality is noted. Electronically Signed   By: Lupita Raider, M.D.   On: 04/22/2018 11:09   MR Brain Wo Contrast (neuro protocol)  Result Date: 04/22/2018 CLINICAL DATA:  Numbness or tingling, paresthesia. Intermittent left arm numbness for 2 weeks EXAM: MRI HEAD WITHOUT CONTRAST TECHNIQUE: Multiplanar, multiecho pulse sequences of the brain and surrounding structures were obtained without  intravenous contrast. COMPARISON:  CT from earlier today FINDINGS: Brain: Small focus of subarachnoid FLAIR hyperintensity and gradient hypointensity along the precentral gyrus on the right, this area is high-density by CT. No other site of hemorrhage seen, either acute or chronic. There is mild for age chronic small vessel ischemia in the cerebral white matter. Mild cerebral volume loss. Clustered appearance of gyri at the left vertex, nonspecific and likely noncontributory. No underlying mass is seen and there is no extrinsic mass effect by collection. No hydrocephalus. Vascular: Major flow voids are preserved Skull and upper cervical spine: No evidence of marrow lesion. Diffuse cervical facet spurring with mild C3-4 and C4-5 anterolisthesis. Sinuses/Orbits:  Left cataract resection. Other: These results were called by telephone at the time of interpretation on 04/22/2018 at 2:10 pm to Dr. Samuel Wilson , who verbally acknowledged these results. IMPRESSION: 1. Small volume subarachnoid hemorrhage along the high right frontal convexity. Trauma, coagulopathy, or vasculopathy/amyloid are primary considerations in this location. There is normal superior sagittal sinus flow voids. No acute or remote hemorrhage seen elsewhere. 2. Mild atrophy and chronic small vessel ischemia for age. Electronically Signed   By: Marnee Spring M.D.   On: 04/22/2018 14:14   EEG adult  Result Date: 04/23/2018 Rejeana Brock, MD     04/23/2018  1:34 PM History: 87 yo F with intermittent numbness Sedation: None Technique: This is a 21 channel routine scalp EEG performed at the bedside with bipolar and monopolar montages arranged in accordance to the international 10/20 system of electrode placement. One channel was dedicated to EKG recording. Background: The background consists of intermixed alpha and beta activities. There is a well defined posterior dominant rhythm of 9-10 Hz that attenuates with eye opening. Sleep is not recorded. Photic stimulation: Physiologic driving is not performed EEG Abnormalities: None Clinical Interpretation: This normal EEG is recorded in the waking state. There was no seizure or seizure predisposition recorded on this study. Please note that a normal EEG does not preclude the possibility of epilepsy. Ritta Slot, MD [facility] [phone] If 7pm- 7am, please page neurology on call as listed in AMION.   ECHOCARDIOGRAM COMPLETE  Result Date: 04/23/2018                            [facility]*                   *[facility]*                         [address]                        [place], [place] [address]                            [phone]  ------------------------------------------------------------------- Transthoracic Echocardiography Patient:    Vickie Wilson, Vickie Wilson MR #:       [id number] Study Date: 04/23/2018 Gender:     F Age:        87 Height:     154.9 cm Weight:     50.6 kg BSA:        1.48 m^2 Pt. Status: Room:       3W19C  ATTENDING    Vickie Wilson [id number]  PERFORMING   [facility], Inpatient  ADMITTING    Mariea Clonts, Courage  ORDERING     Emokpae, Courage  REFERRING    Emokpae, Courage  SONOGRAPHER  [employer] cc: ------------------------------------------------------------------- LV EF: 55% -   60% ------------------------------------------------------------------- Indications:      TIA 435.9. ------------------------------------------------------------------- Study Conclusions - Left ventricle: The cavity size was normal. Wall thickness was   normal. Systolic function was normal. The estimated ejection   fraction was in the range of 55% to 60%. Wall motion was normal;   there were no regional wall motion abnormalities. Doppler   parameters are consistent with abnormal left ventricular   relaxation (grade 1 diastolic dysfunction). - Aortic valve: Valve area (VTI): 2.42 cm^2. Valve area (Vmax):   2.38 cm^2. Valve area (Vmean): 2.23 cm^2. ------------------------------------------------------------------- Study data:  Comparison was made to the study of 03/02/2018.  Study status:  Routine.  Procedure:  Transthoracic echocardiography. Image quality was good.  Study completion:  There were no complications.          Transthoracic echocardiography.  2D, spectral Doppler, and color Doppler.  Birthdate:  Patient birthdate: May 10, 1931.  Age:  Patient is 87 yr old.  Sex:  Gender: female.    BMI: 21.1 kg/m^2.  Blood pressure:     131/72  Patient status:  Inpatient.  Study date:  Study date: 04/23/2018. Study time: 02:16 PM.  Location:  Bedside. -------------------------------------------------------------------  ------------------------------------------------------------------- Left ventricle:  The cavity size was normal. Wall thickness was normal. Systolic function was normal. The estimated ejection fraction was in the range of 55% to 60%. Wall motion was normal; there were no regional wall motion abnormalities. Doppler parameters are consistent with abnormal left ventricular relaxation (grade 1 diastolic dysfunction). ------------------------------------------------------------------- Aortic valve:   Mildly calcified leaflets. Cusp separation was normal.  Doppler:  Transvalvular velocity was within the normal range. There was no stenosis. There was no regurgitation.    VTI ratio of LVOT to aortic valve: 0.77. Valve area (VTI): 2.42 cm^2. Indexed valve area (VTI): 1.64 cm^2/m^2. Peak velocity ratio of LVOT to aortic valve: 0.76. Valve area (Vmax): 2.38 cm^2. Indexed valve area (Vmax): 1.61 cm^2/m^2. Mean velocity ratio of LVOT to aortic valve: 0.71. Valve area (Vmean): 2.23 cm^2. Indexed valve area (Vmean): 1.51 cm^2/m^2.    Mean gradient (S): 3 mm Hg. Peak gradient (S): 5 mm Hg. ------------------------------------------------------------------- Aorta:  Aortic root: The aortic root was normal in size. Ascending aorta: The ascending aorta was normal in size. ------------------------------------------------------------------- Mitral valve:   Structurally normal valve.   Leaflet separation was normal.  Doppler:  Transvalvular velocity was within the normal range. There was no evidence for stenosis. There was no regurgitation. ------------------------------------------------------------------- Left atrium:  The atrium was normal in size. ------------------------------------------------------------------- Right ventricle:  The cavity size was normal. Wall thickness was normal. Systolic function was normal. ------------------------------------------------------------------- Pulmonic valve:   Poorly visualized.  Doppler:  There  was no significant regurgitation. ------------------------------------------------------------------- Tricuspid valve:   Structurally normal valve.   Leaflet separation was normal.  Doppler:  Transvalvular velocity was within the normal range. There was mild regurgitation. ------------------------------------------------------------------- Right atrium:  The atrium was normal in size. ------------------------------------------------------------------- Pericardium:  There was no pericardial effusion. ------------------------------------------------------------------- Systemic veins: Inferior vena cava: The vessel was normal in size. The respirophasic diameter changes were in the normal range (>= 50%), consistent with normal central venous pressure. ------------------------------------------------------------------- Measurements  Left ventricle                           Value  Reference  LV ID, ED, PLAX chordal          (L)     42.7  mm       43 - 52  LV ID, ES, PLAX chordal                  29.4  mm       23 - 38  LV fx shortening, PLAX chordal           31    %        >=29  LV PW thickness, ED                      4.55  mm       ----------  IVS/LV PW ratio, ED              (H)     1.58           <=1.3  Stroke volume, 2D                        65    ml       ----------  Stroke volume/bsa, 2D                    44    ml/m^2   ----------  LV e&', lateral                           5.87  cm/s     ----------  LV E/e&', lateral                         10.75          ----------  LV s&', lateral                           6.42  cm/s     ----------  LV e&', medial                            4.24  cm/s     ----------  LV E/e&', medial                          14.88          ----------  LV e&', average                           5.06  cm/s     ----------  LV E/e&', average                         12.48          ----------   Ventricular septum                       Value          Reference  IVS thickness, ED                         7.2   mm       ----------   LVOT  Value          Reference  LVOT ID, S                               20    mm       ----------  LVOT area                                3.14  cm^2     ----------  LVOT peak velocity, S                    87.9  cm/s     ----------  LVOT mean velocity, S                    58.4  cm/s     ----------  LVOT VTI, S                              20.7  cm       ----------   Aortic valve                             Value          Reference  Aortic valve peak velocity, S            116   cm/s     ----------  Aortic valve mean velocity, S            82.4  cm/s     ----------  Aortic valve VTI, S                      26.9  cm       ----------  Aortic mean gradient, S                  3     mm Hg    ----------  Aortic peak gradient, S                  5     mm Hg    ----------  VTI ratio, LVOT/AV                       0.77           ----------  Aortic valve area, VTI                   2.42  cm^2     ----------  Aortic valve area/bsa, VTI               1.64  cm^2/m^2 ----------  Velocity ratio, peak, LVOT/AV            0.76           ----------  Aortic valve area, peak velocity         2.38  cm^2     ----------  Aortic valve area/bsa, peak              1.61  cm^2/m^2 ----------  velocity  Velocity ratio, mean, LVOT/AV            0.71           ----------  Aortic valve area,  mean velocity         2.23  cm^2     ----------  Aortic valve area/bsa, mean              1.51  cm^2/m^2 ----------  velocity   Aorta                                    Value          Reference  Aortic root ID, ED                       27    mm       ----------   Left atrium                              Value          Reference  LA ID, A-P, ES                           25    mm       ----------  LA ID/bsa, A-P                           1.69  cm/m^2   <=2.2  LA volume, S                             38.7  ml       ----------  LA volume/bsa, S                         26.2  ml/m^2    ----------  LA volume, ES, 1-p A4C                   31.7  ml       ----------  LA volume/bsa, ES, 1-p A4C               21.5  ml/m^2   ----------  LA volume, ES, 1-p A2C                   45.7  ml       ----------  LA volume/bsa, ES, 1-p A2C               30.9  ml/m^2   ----------   Mitral valve                             Value          Reference  Mitral E-wave peak velocity              63.1  cm/s     ----------  Mitral A-wave peak velocity              95.1  cm/s     ----------  Mitral deceleration time         (H)     363   ms       150 - 230  Mitral E/A ratio, peak                   0.7            ----------  Pulmonary veins                          Value          Reference  Pulmonary vein peak velocity, S          50    cm/s     ----------  Pulmonary vein peak velocity, D          28    cm/s     ----------  Pulmonary vein velocity ratio,           1.8            ----------  peak, S/D   Pulmonary arteries                       Value          Reference  PA pressure, S, DP                       27    mm Hg    <=30   Tricuspid valve                          Value          Reference  Tricuspid regurg peak velocity           247   cm/s     ----------  Tricuspid peak RV-RA gradient            24    mm Hg    ----------   Right atrium                             Value          Reference  RA ID, S-I, ES, A4C              (H)     49.8  mm       34 - 49  RA area, ES, A4C                         13.2  cm^2     8.3 - 19.5  RA volume, ES, A/L                       29    ml       ----------  RA volume/bsa, ES, A/L                   19.6  ml/m^2   ----------   Systemic veins                           Value          Reference  Estimated CVP                            3     mm Hg    ----------   Right ventricle                          Value          Reference  RV ID, ED, PLAX  33.1  mm       19 - 38  TAPSE                                    19.2  mm       ----------  RV pressure, S, DP                        27    mm Hg    <=30  RV s&', lateral, S                        10.4  cm/s     ----------   Pulmonic valve                           Value          Reference  Pulmonic valve peak velocity, S          84.2  cm/s     ---------- Legend: (L)  and  (H)  mark values outside specified reference range. ------------------------------------------------------------------- Prepared and Electronically Authenticated by Nicholes Mango, MD [id number]:38   Assessment & Plan:     Follow-up: No follow-ups on file.  Sonda Primes, MD

## 2020-09-19 NOTE — Assessment & Plan Note (Signed)
On Keppra No relapse

## 2020-09-19 NOTE — Assessment & Plan Note (Signed)
No relapse Cont w/Crestor; BP control w/Atenolol, amlodipine

## 2020-09-19 NOTE — Assessment & Plan Note (Signed)
Cont w/Norvasc, Atenolol Labs

## 2020-10-14 ENCOUNTER — Other Ambulatory Visit: Payer: Self-pay | Admitting: Internal Medicine

## 2020-11-11 ENCOUNTER — Other Ambulatory Visit: Payer: Self-pay | Admitting: Internal Medicine

## 2020-11-19 DIAGNOSIS — W260XXA Contact with knife, initial encounter: Secondary | ICD-10-CM | POA: Diagnosis not present

## 2020-11-19 DIAGNOSIS — S61211A Laceration without foreign body of left index finger without damage to nail, initial encounter: Secondary | ICD-10-CM | POA: Diagnosis not present

## 2020-12-10 ENCOUNTER — Other Ambulatory Visit: Payer: Self-pay | Admitting: Internal Medicine

## 2020-12-24 ENCOUNTER — Other Ambulatory Visit (INDEPENDENT_AMBULATORY_CARE_PROVIDER_SITE_OTHER): Payer: Medicare Other

## 2020-12-24 DIAGNOSIS — G459 Transient cerebral ischemic attack, unspecified: Secondary | ICD-10-CM

## 2020-12-24 DIAGNOSIS — E785 Hyperlipidemia, unspecified: Secondary | ICD-10-CM | POA: Diagnosis not present

## 2020-12-24 DIAGNOSIS — I10 Essential (primary) hypertension: Secondary | ICD-10-CM

## 2020-12-24 DIAGNOSIS — R569 Unspecified convulsions: Secondary | ICD-10-CM | POA: Diagnosis not present

## 2020-12-24 LAB — CBC WITH DIFFERENTIAL/PLATELET
Basophils Absolute: 0 10*3/uL (ref 0.0–0.1)
Basophils Relative: 0.6 % (ref 0.0–3.0)
Eosinophils Absolute: 0.2 10*3/uL (ref 0.0–0.7)
Eosinophils Relative: 3.5 % (ref 0.0–5.0)
HCT: 38.5 % (ref 36.0–46.0)
Hemoglobin: 12.8 g/dL (ref 12.0–15.0)
Lymphocytes Relative: 41 % (ref 12.0–46.0)
Lymphs Abs: 2.6 10*3/uL (ref 0.7–4.0)
MCHC: 33.3 g/dL (ref 30.0–36.0)
MCV: 84.7 fl (ref 78.0–100.0)
Monocytes Absolute: 0.6 10*3/uL (ref 0.1–1.0)
Monocytes Relative: 9 % (ref 3.0–12.0)
Neutro Abs: 2.9 10*3/uL (ref 1.4–7.7)
Neutrophils Relative %: 45.9 % (ref 43.0–77.0)
Platelets: 315 10*3/uL (ref 150.0–400.0)
RBC: 4.54 Mil/uL (ref 3.87–5.11)
RDW: 14.9 % (ref 11.5–15.5)
WBC: 6.4 10*3/uL (ref 4.0–10.5)

## 2020-12-24 LAB — COMPREHENSIVE METABOLIC PANEL
ALT: 13 U/L (ref 0–35)
AST: 22 U/L (ref 0–37)
Albumin: 4.1 g/dL (ref 3.5–5.2)
Alkaline Phosphatase: 49 U/L (ref 39–117)
BUN: 16 mg/dL (ref 6–23)
CO2: 26 mEq/L (ref 19–32)
Calcium: 9.2 mg/dL (ref 8.4–10.5)
Chloride: 102 mEq/L (ref 96–112)
Creatinine, Ser: 1.13 mg/dL (ref 0.40–1.20)
GFR: 43.63 mL/min — ABNORMAL LOW (ref >60.00)
Glucose, Bld: 93 mg/dL (ref 70–99)
Potassium: 3.9 mEq/L (ref 3.5–5.1)
Sodium: 138 mEq/L (ref 135–145)
Total Bilirubin: 0.5 mg/dL (ref 0.2–1.2)
Total Protein: 7 g/dL (ref 6.0–8.3)

## 2020-12-24 LAB — TSH: TSH: 3.39 u[IU]/mL (ref 0.35–4.50)

## 2020-12-24 LAB — LIPID PANEL
Cholesterol: 192 mg/dL (ref 0–200)
HDL: 53.8 mg/dL (ref >39.00)
LDL Cholesterol: 113 mg/dL — ABNORMAL HIGH (ref 0–99)
NonHDL: 138.62
Total CHOL/HDL Ratio: 4
Triglycerides: 128 mg/dL (ref 0.0–149.0)
VLDL: 25.6 mg/dL (ref 0.0–40.0)

## 2020-12-25 ENCOUNTER — Other Ambulatory Visit (INDEPENDENT_AMBULATORY_CARE_PROVIDER_SITE_OTHER): Payer: Medicare Other

## 2020-12-25 DIAGNOSIS — I10 Essential (primary) hypertension: Secondary | ICD-10-CM

## 2020-12-25 LAB — URINALYSIS, ROUTINE W REFLEX MICROSCOPIC
Bilirubin Urine: NEGATIVE
Hgb urine dipstick: NEGATIVE
Ketones, ur: NEGATIVE
Nitrite: NEGATIVE
Specific Gravity, Urine: <=1.005 — AB (ref 1.000–1.030)
Total Protein, Urine: NEGATIVE
Urine Glucose: NEGATIVE
Urobilinogen, UA: 0.2 (ref 0.0–1.0)
pH: 6.5 (ref 5.0–8.0)

## 2021-01-15 ENCOUNTER — Other Ambulatory Visit: Payer: Self-pay

## 2021-01-15 ENCOUNTER — Telehealth (INDEPENDENT_AMBULATORY_CARE_PROVIDER_SITE_OTHER): Payer: Medicare Other | Admitting: Internal Medicine

## 2021-01-15 DIAGNOSIS — J069 Acute upper respiratory infection, unspecified: Secondary | ICD-10-CM | POA: Diagnosis not present

## 2021-01-15 MED ORDER — AZITHROMYCIN 250 MG PO TABS
ORAL_TABLET | ORAL | 0 refills | Status: DC
Start: 2021-01-15 — End: 2021-03-11

## 2021-01-15 NOTE — Assessment & Plan Note (Signed)
Likely viral.  Hopefully it will improve on its own.  The patient has to go out of town.  I will email a prescription for a Z-Pak in case she gets worse instead of getting better.

## 2021-01-15 NOTE — Progress Notes (Signed)
Virtual Visit via Telephone Note  I connected with Vickie Wilson on 01/15/21 at  2:20 PM EDT by telephone and verified that I am speaking with the correct person using two identifiers.  Location: Patient: Home Provider: [facility]   I discussed the limitations, risks, security and privacy concerns of performing an evaluation and management service by telephone and the availability of in person appointments. I also discussed with the patient that there may be a patient responsible charge related to this service. The patient expressed understanding and agreed to proceed.   History of Present Illness: The patient is complaining of dry cough and sore throat starting this weekend.  She had a negative COVID test on Monday.  No fever.   Observations/Objective:  Vickie Wilson sounds normal on the phone Assessment and Plan: See plan  Follow Up Instructions:    I discussed the assessment and treatment plan with the patient. The patient was provided an opportunity to ask questions and all were answered. The patient agreed with the plan and demonstrated an understanding of the instructions.   The patient was advised to call back or seek an in-person evaluation if the symptoms worsen or if the condition fails to improve as anticipated.  I provided 12 minutes of non-face-to-face time during this encounter.   Sonda Primes, MD

## 2021-01-30 ENCOUNTER — Other Ambulatory Visit: Payer: Self-pay | Admitting: Internal Medicine

## 2021-02-06 ENCOUNTER — Ambulatory Visit: Payer: Medicare Other | Admitting: Internal Medicine

## 2021-03-11 ENCOUNTER — Other Ambulatory Visit: Payer: Self-pay

## 2021-03-11 ENCOUNTER — Ambulatory Visit (INDEPENDENT_AMBULATORY_CARE_PROVIDER_SITE_OTHER): Payer: Medicare Other | Admitting: Internal Medicine

## 2021-03-11 ENCOUNTER — Encounter: Payer: Self-pay | Admitting: Internal Medicine

## 2021-03-11 DIAGNOSIS — I10 Essential (primary) hypertension: Secondary | ICD-10-CM | POA: Diagnosis not present

## 2021-03-11 DIAGNOSIS — I700 Atherosclerosis of aorta: Secondary | ICD-10-CM

## 2021-03-11 DIAGNOSIS — E119 Type 2 diabetes mellitus without complications: Secondary | ICD-10-CM

## 2021-03-11 DIAGNOSIS — R569 Unspecified convulsions: Secondary | ICD-10-CM

## 2021-03-11 DIAGNOSIS — E785 Hyperlipidemia, unspecified: Secondary | ICD-10-CM | POA: Diagnosis not present

## 2021-03-11 DIAGNOSIS — F411 Generalized anxiety disorder: Secondary | ICD-10-CM

## 2021-03-11 MED ORDER — ROSUVASTATIN CALCIUM 20 MG PO TABS: ORAL_TABLET | ORAL | 3 refills | Status: DC

## 2021-03-11 NOTE — Assessment & Plan Note (Signed)
On Paxil 

## 2021-03-11 NOTE — Progress Notes (Signed)
Subjective:  Patient ID: Vickie Wilson, female    DOB: June 14, 1931  Age: 87 y.o. MRN: [id number]  CC: Follow-up (Pt states she had some issues to discuss.. would not elaborate)   HPI Vickie Wilson presents for HTN, anxiety, dyslipidemia f/u  Outpatient Medications Prior to Visit  Medication Sig Dispense Refill   acetaminophen (TYLENOL 8 HOUR) 650 MG CR tablet Take 1 tablet (650 mg total) by mouth every 8 (eight) hours as needed for pain. 100 tablet 3   amLODipine (NORVASC) 5 MG tablet TAKE ONE TABLET BY MOUTH DAILY 90 tablet 1   atenolol (TENORMIN) 100 MG tablet TAKE (1/2) TABLET TWICE DAILY. 90 tablet 3   Cholecalciferol (VITAMIN D3) 50 MCG (2000 UT) capsule Take 1 capsule (2,000 Units total) by mouth daily. 100 capsule 3   levETIRAcetam (KEPPRA) 750 MG tablet Take 1 tablet (750 mg total) by mouth 2 (two) times daily. 180 tablet 3   loratadine (CLARITIN) 10 MG tablet Take 1 tablet (10 mg total) by mouth daily. 30 tablet 5   Multiple Vitamins-Minerals (CENTRUM SILVER) CHEW Chew 1 each by mouth 3 (three) times a week.     PARoxetine (PAXIL) 10 MG tablet TAKE 1 TABLET IN THE MORNING. 90 tablet 1   rosuvastatin (CRESTOR) 20 MG tablet TAKE 1/2 TABLET MONDAY, WEDNESDAY & FRIDAY. 19 tablet 2   azithromycin (ZITHROMAX Z-PAK) 250 MG tablet As directed (Patient not taking: Reported on 03/11/2021) 6 tablet 0   triamcinolone cream (KENALOG) 0.1 % Apply 1 application topically 3 (three) times daily. (Patient not taking: Reported on 03/11/2021) 80 g 1   No facility-administered medications prior to visit.    ROS: Review of Systems  Constitutional:  Negative for activity change, appetite change, chills, fatigue and unexpected weight change.  HENT:  Negative for congestion, mouth sores and sinus pressure.   Eyes:  Negative for visual disturbance.  Respiratory:  Negative for cough and chest tightness.   Gastrointestinal:  Negative for abdominal pain and nausea.  Genitourinary:  Negative for  difficulty urinating, frequency and vaginal pain.  Musculoskeletal:  Negative for back pain and gait problem.  Skin:  Negative for pallor and rash.  Neurological:  Negative for dizziness, tremors, weakness, numbness and headaches.  Psychiatric/Behavioral:  Negative for confusion and sleep disturbance. The patient is nervous/anxious.    Objective:  BP (!) 148/82 (BP Location: Left Arm)   Pulse (!) 52   Temp 98 F (36.7 C) (Oral)   Ht  (1.575 m)   Wt 110 lb 6.4 oz (50.1 kg)   SpO2 94%   BMI 20.19 kg/m   BP Readings from Last 3 Encounters:  03/11/21 (!) 148/82  09/19/20 112/68  07/15/20 (!) 148/82    Wt Readings from Last 3 Encounters:  03/11/21 110 lb 6.4 oz (50.1 kg)  09/19/20 108 lb (49 kg)  07/15/20 108 lb 9.6 oz (49.3 kg)    Physical Exam Constitutional:      General: She is not in acute distress.    Appearance: She is well-developed.  HENT:     Head: Normocephalic.     Right Ear: External ear normal.     Left Ear: External ear normal.     Nose: Nose normal.  Eyes:     General:        Right eye: No discharge.        Left eye: No discharge.     Conjunctiva/sclera: Conjunctivae normal.     Pupils: Pupils are equal, round,  and reactive to light.  Neck:     Thyroid: No thyromegaly.     Vascular: No JVD.     Trachea: No tracheal deviation.  Cardiovascular:     Rate and Rhythm: Normal rate and regular rhythm.     Heart sounds: Normal heart sounds.  Pulmonary:     Effort: No respiratory distress.     Breath sounds: No stridor. No wheezing.  Abdominal:     General: Bowel sounds are normal. There is no distension.     Palpations: Abdomen is soft. There is no mass.     Tenderness: There is no abdominal tenderness. There is no guarding or rebound.  Musculoskeletal:        General: No tenderness.     Cervical back: Normal range of motion and neck supple. No rigidity.  Lymphadenopathy:     Cervical: No cervical adenopathy.  Skin:    Findings: No erythema or  rash.  Neurological:     Cranial Nerves: No cranial nerve deficit.     Motor: No abnormal muscle tone.     Coordination: Coordination normal.     Deep Tendon Reflexes: Reflexes normal.  Psychiatric:        Behavior: Behavior normal.        Thought Content: Thought content normal.        Judgment: Judgment normal.    Lab Results  Component Value Date   WBC 6.4 12/24/2020   HGB 12.8 12/24/2020   HCT 38.5 12/24/2020   PLT 315.0 12/24/2020   GLUCOSE 93 12/24/2020   CHOL 192 12/24/2020   TRIG 128.0 12/24/2020   HDL 53.80 12/24/2020   LDLDIRECT 125.6 01/16/2013   LDLCALC 113 (H) 12/24/2020   ALT 13 12/24/2020   AST 22 12/24/2020   NA 138 12/24/2020   K 3.9 12/24/2020   CL 102 12/24/2020   CREATININE 1.13 12/24/2020   BUN 16 12/24/2020   CO2 26 12/24/2020   TSH 3.39 12/24/2020   INR 0.92 04/22/2018   HGBA1C 6.1 08/22/2019   MICROALBUR 2.8 (H) 09/17/2016    CT ANGIO HEAD W OR WO CONTRAST  Result Date: 04/23/2018 CLINICAL DATA:  Subarachnoid hemorrhage, proven, follow-up. EXAM: CT ANGIOGRAPHY HEAD AND NECK TECHNIQUE: Multidetector CT imaging of the head and neck was performed using the standard protocol during bolus administration of intravenous contrast. Multiplanar CT image reconstructions and MIPs were obtained to evaluate the vascular anatomy. Carotid stenosis measurements (when applicable) are obtained utilizing NASCET criteria, using the distal internal carotid diameter as the denominator. CONTRAST:  50mL ISOVUE-370 IOPAMIDOL (ISOVUE-370) INJECTION 76% COMPARISON:  The of conservatively or FINDINGS: CT HEAD FINDINGS Brain: Subarachnoid hemorrhage within the right middle frontal sulcus is stable. No new areas of hemorrhage are present. Atrophy and white matter disease is noted bilaterally. No mass lesion is present. No significant cortical infarct is present. Vascular: Atherosclerotic calcifications are present within the cavernous internal carotid arteries. There is no hyperdense  vessel. Skull: Calvarium is intact. No focal lytic or blastic lesions are present. Asymmetric degenerative changes are present at the right TMJ. Sinuses: The paranasal sinuses and mastoid air cells are clear. Orbits: Left lens replacement is present. Globes and orbits are otherwise within normal limits. Review of the MIP images confirms the above findings CTA NECK FINDINGS Aortic arch: A 3 vessel arch configuration is present. No significant stenosis or aneurysm is present. Right carotid system: The right common carotid artery is within normal limits. Dense atherosclerotic calcifications are present at the bifurcation without  a significant stenosis relative to the more distal vessel. Left carotid system: The left common carotid artery is within normal limits. Atherosclerotic calcifications are present at the bifurcation without a significant luminal stenosis relative to the more distal vessel. Vertebral arteries: The vertebral arteries are codominant. Both vertebral arteries originate from the subclavian arteries without a significant stenosis. There is some tortuosity of the V1 segments bilaterally. No significant stenosis or vessel injury is present to either vertebral artery in the neck. Skeleton: Degenerative anterolisthesis is present at C2-3, C3-4, and C4-5. There is chronic loss of disc height with endplate sclerosis at C5-6 and C6-7. Marked lucency surrounds the roots of the residual right maxillary molar. Dental caries are present without periapical disease. Other neck: The soft tissues the neck are otherwise unremarkable. No significant adenopathy is present. No mucosal lesions are evident. Salivary glands are within normal limits bilaterally. Thyroid is normal. Upper chest: Centrilobular emphysematous changes are present bilaterally. No focal nodule or mass lesion is present. Review of the MIP images confirms the above findings CTA HEAD FINDINGS Anterior circulation: Atherosclerotic calcifications are  present within the cavernous internal carotid arteries bilaterally. There is no hyperdense vessel. 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 No definite posterior communicating artery is present. The A1 and M1 segments are normal. The anterior communicating artery is patent. MCA bifurcations are within normal limits bilaterally. The ACA and MCA branch vessels are normal. No focal vascular lesion is evident at the location of the subarachnoid hemorrhage over the right frontal convexity. Posterior circulation: Vertebral arteries are within normal limits bilaterally. PICA origins are visualized and normal. The basilar artery is normal. Both posterior cerebral arteries originate from the basilar tip. PCA branch vessels are within normal limits bilaterally. Venous sinuses: The dural sinuses are patent. Straight sinus and deep cerebral veins are intact. Cortical veins are normal. Anatomic variants: None Delayed phase: Delayed images demonstrate no pathologic enhancement. White matter disease is accentuated. Review of the MIP images confirms the above findings IMPRESSION: 1. No acute or focal vascular lesion to explain subarachnoid hemorrhage over the convexity of the right frontal lobe. 2. No mass lesion. 3. Stable atrophy and white matter disease. 4. 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 Recommend follow-up CTA of the head in 1 year to assess stability. This aneurysm does not correspond with the area of subarachnoid hemorrhage. Electronically Signed   By: Marin Roberts M.D.   On: 04/23/2018 19:28   DG Chest 2 View  Result Date: 04/22/2018 CLINICAL DATA:  Intermittent LEFT arm numbness for 2 weeks, history hypertension, diabetes mellitus, anxiety, former smoker EXAM: CHEST - 2 VIEW COMPARISON:  02/12/2018 FINDINGS: Normal heart size, mediastinal contours, and pulmonary vascularity. Atherosclerotic calcification aorta. Lungs  hyperinflated with minimal subsegmental atelectasis at LEFT costophrenic angle. Remaining lungs clear. No acute infiltrate, pleural effusion or pneumothorax. Bones appear demineralized with old healed fracture of the posterolateral RIGHT sixth rib. IMPRESSION: Hyperinflated lungs with minimal subsegmental atelectasis at LEFT costophrenic angle. Electronically Signed   By: Ulyses Southward M.D.   On: 04/22/2018 11:07   CT HEAD WO CONTRAST  Result Date: 04/22/2018 CLINICAL DATA:  Left arm numbness. EXAM: CT HEAD WITHOUT CONTRAST CT CERVICAL SPINE WITHOUT CONTRAST TECHNIQUE: Multidetector CT imaging of the head and cervical spine was performed following the standard protocol without intravenous contrast. Multiplanar CT image reconstructions of the cervical spine were also generated. COMPARISON:  None. FINDINGS: CT HEAD FINDINGS Brain: Mild diffuse  cortical atrophy is noted. Mild chronic ischemic white matter disease is noted. No mass effect or midline shift is noted. Ventricular size is within normal limits. There is no evidence of mass lesion, hemorrhage or acute infarction. Vascular: No hyperdense vessel or unexpected calcification. Skull: Normal. Negative for fracture or focal lesion. Sinuses/Orbits: No acute finding. Other: None. CT CERVICAL SPINE FINDINGS Alignment: Grade 1 anterolisthesis of C3-4 C4-5 is noted secondary to posterior facet joint hypertrophy. Skull base and vertebrae: No acute fracture. No primary bone lesion or focal pathologic process. Soft tissues and spinal canal: No prevertebral fluid or swelling. No visible canal hematoma. Disc levels: Severe degenerative disc disease is noted at C5-6 and C6-7 with anterior osteophyte formation. Mild degenerative disc disease is noted at C4-5 and C7-T1. Upper chest: Negative. Other: Degenerative changes seen involving posterior facet joints bilaterally, right greater than left. IMPRESSION: Mild diffuse cortical atrophy. Mild chronic ischemic white matter  disease. No acute intracranial abnormality seen. Multilevel degenerative disc disease is noted in the cervical spine. No fracture or other acute abnormality is noted. Electronically Signed   By: Lupita Raider, M.D.   On: 04/22/2018 11:09   CT ANGIO NECK W OR WO CONTRAST  Result Date: 04/23/2018 CLINICAL DATA:  Subarachnoid hemorrhage, proven, follow-up. EXAM: CT ANGIOGRAPHY HEAD AND NECK TECHNIQUE: Multidetector CT imaging of the head and neck was performed using the standard protocol during bolus administration of intravenous contrast. Multiplanar CT image reconstructions and MIPs were obtained to evaluate the vascular anatomy. Carotid stenosis measurements (when applicable) are obtained utilizing NASCET criteria, using the distal internal carotid diameter as the denominator. CONTRAST:  104mL ISOVUE-370 IOPAMIDOL (ISOVUE-370) INJECTION 76% COMPARISON:  The of conservatively or FINDINGS: CT HEAD FINDINGS Brain: Subarachnoid hemorrhage within the right middle frontal sulcus is stable. No new areas of hemorrhage are present. Atrophy and white matter disease is noted bilaterally. No mass lesion is present. No significant cortical infarct is present. Vascular: Atherosclerotic calcifications are present within the cavernous internal carotid arteries. There is no hyperdense vessel. Skull: Calvarium is intact. No focal lytic or blastic lesions are present. Asymmetric degenerative changes are present at the right TMJ. Sinuses: The paranasal sinuses and mastoid air cells are clear. Orbits: Left lens replacement is present. Globes and orbits are otherwise within normal limits. Review of the MIP images confirms the above findings CTA NECK FINDINGS Aortic arch: A 3 vessel arch configuration is present. No significant stenosis or aneurysm is present. Right carotid system: The right common carotid artery is within normal limits. Dense atherosclerotic calcifications are present at the bifurcation without a significant  stenosis relative to the more distal vessel. Left carotid system: The left common carotid artery is within normal limits. Atherosclerotic calcifications are present at the bifurcation without a significant luminal stenosis relative to the more distal vessel. Vertebral arteries: The vertebral arteries are codominant. Both vertebral arteries originate from the subclavian arteries without a significant stenosis. There is some tortuosity of the V1 segments bilaterally. No significant stenosis or vessel injury is present to either vertebral artery in the neck. Skeleton: Degenerative anterolisthesis is present at C2-3, C3-4, and C4-5. There is chronic loss of disc height with endplate sclerosis at C5-6 and C6-7. Marked lucency surrounds the roots of the residual right maxillary molar. Dental caries are present without periapical disease. Other neck: The soft tissues the neck are otherwise unremarkable. No significant adenopathy is present. No mucosal lesions are evident. Salivary glands are within normal limits bilaterally. Thyroid is normal. Upper chest: Centrilobular  emphysematous changes are present bilaterally. No focal nodule or mass lesion is present. Review of the MIP images confirms the above findings CTA HEAD FINDINGS Anterior circulation: Atherosclerotic calcifications are present within the cavernous internal carotid arteries bilaterally. There is no hyperdense vessel. 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 No definite posterior communicating artery is present. The A1 and M1 segments are normal. The anterior communicating artery is patent. MCA bifurcations are within normal limits bilaterally. The ACA and MCA branch vessels are normal. No focal vascular lesion is evident at the location of the subarachnoid hemorrhage over the right frontal convexity. Posterior circulation: Vertebral arteries are within normal limits bilaterally. PICA origins are visualized and normal.  The basilar artery is normal. Both posterior cerebral arteries originate from the basilar tip. PCA branch vessels are within normal limits bilaterally. Venous sinuses: The dural sinuses are patent. Straight sinus and deep cerebral veins are intact. Cortical veins are normal. Anatomic variants: None Delayed phase: Delayed images demonstrate no pathologic enhancement. White matter disease is accentuated. Review of the MIP images confirms the above findings IMPRESSION: 1. No acute or focal vascular lesion to explain subarachnoid hemorrhage over the convexity of the right frontal lobe. 2. No mass lesion. 3. Stable atrophy and white matter disease. 4. 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 Recommend follow-up CTA of the head in 1 year to assess stability. This aneurysm does not correspond with the area of subarachnoid hemorrhage. Electronically Signed   By: Marin Roberts M.D.   On: 04/23/2018 19:28   CT Cervical Spine Wo Contrast  Result Date: 04/22/2018 CLINICAL DATA:  Left arm numbness. EXAM: CT HEAD WITHOUT CONTRAST CT CERVICAL SPINE WITHOUT CONTRAST TECHNIQUE: Multidetector CT imaging of the head and cervical spine was performed following the standard protocol without intravenous contrast. Multiplanar CT image reconstructions of the cervical spine were also generated. COMPARISON:  None. FINDINGS: CT HEAD FINDINGS Brain: Mild diffuse cortical atrophy is noted. Mild chronic ischemic white matter disease is noted. No mass effect or midline shift is noted. Ventricular size is within normal limits. There is no evidence of mass lesion, hemorrhage or acute infarction. Vascular: No hyperdense vessel or unexpected calcification. Skull: Normal. Negative for fracture or focal lesion. Sinuses/Orbits: No acute finding. Other: None. CT CERVICAL SPINE FINDINGS Alignment: Grade 1 anterolisthesis of C3-4 C4-5 is noted secondary to posterior facet joint hypertrophy. Skull base and  vertebrae: No acute fracture. No primary bone lesion or focal pathologic process. Soft tissues and spinal canal: No prevertebral fluid or swelling. No visible canal hematoma. Disc levels: Severe degenerative disc disease is noted at C5-6 and C6-7 with anterior osteophyte formation. Mild degenerative disc disease is noted at C4-5 and C7-T1. Upper chest: Negative. Other: Degenerative changes seen involving posterior facet joints bilaterally, right greater than left. IMPRESSION: Mild diffuse cortical atrophy. Mild chronic ischemic white matter disease. No acute intracranial abnormality seen. Multilevel degenerative disc disease is noted in the cervical spine. No fracture or other acute abnormality is noted. Electronically Signed   By: Lupita Raider, M.D.   On: 04/22/2018 11:09   MR Brain Wo Contrast (neuro protocol)  Result Date: 04/22/2018 CLINICAL DATA:  Numbness or tingling, paresthesia. Intermittent left arm numbness for 2 weeks EXAM: MRI HEAD WITHOUT CONTRAST TECHNIQUE: Multiplanar, multiecho pulse sequences of the brain and surrounding structures were obtained without intravenous contrast. COMPARISON:  CT from earlier today FINDINGS: Brain: Small focus of subarachnoid FLAIR hyperintensity and gradient hypointensity  along the precentral gyrus on the right, this area is high-density by CT. No other site of hemorrhage seen, either acute or chronic. There is mild for age chronic small vessel ischemia in the cerebral white matter. Mild cerebral volume loss. Clustered appearance of gyri at the left vertex, nonspecific and likely noncontributory. No underlying mass is seen and there is no extrinsic mass effect by collection. No hydrocephalus. Vascular: Major flow voids are preserved Skull and upper cervical spine: No evidence of marrow lesion. Diffuse cervical facet spurring with mild C3-4 and C4-5 anterolisthesis. Sinuses/Orbits: Left cataract resection. Other: These results were called by telephone at the time of  interpretation on 04/22/2018 at 2:10 pm to Dr. Samuel Jester , who verbally acknowledged these results. IMPRESSION: 1. Small volume subarachnoid hemorrhage along the high right frontal convexity. Trauma, coagulopathy, or vasculopathy/amyloid are primary considerations in this location. There is normal superior sagittal sinus flow voids. No acute or remote hemorrhage seen elsewhere. 2. Mild atrophy and chronic small vessel ischemia for age. Electronically Signed   By: Marnee Spring M.D.   On: 04/22/2018 14:14   EEG adult  Result Date: 04/23/2018 Rejeana Brock, MD     04/23/2018  1:34 PM History: 87 yo F with intermittent numbness Sedation: None Technique: This is a 21 channel routine scalp EEG performed at the bedside with bipolar and monopolar montages arranged in accordance to the international 10/20 system of electrode placement. One channel was dedicated to EKG recording. Background: The background consists of intermixed alpha and beta activities. There is a well defined posterior dominant rhythm of 9-10 Hz that attenuates with eye opening. Sleep is not recorded. Photic stimulation: Physiologic driving is not performed EEG Abnormalities: None Clinical Interpretation: This normal EEG is recorded in the waking state. There was no seizure or seizure predisposition recorded on this study. Please note that a normal EEG does not preclude the possibility of epilepsy. Ritta Slot, MD [facility] [phone] If 7pm- 7am, please page neurology on call as listed in AMION.   ECHOCARDIOGRAM COMPLETE  Result Date: 04/23/2018                            [facility]*                   *[facility]*                         [address]                        [place], [place] [address]                            [phone] ------------------------------------------------------------------- Transthoracic Echocardiography Patient:    Ariyon, Gerstenberger MR #:       [id number]  Study Date: 04/23/2018 Gender:     F Age:        87 Height:     154.9 cm Weight:     50.6 kg BSA:        1.48 m^2 Pt. Status: Room:       3W19C  ATTENDING    Samuel Jester [id number]  PERFORMING   [facility], Inpatient  ADMITTING    Emokpae, Courage  Joya San, Courage  Cindra Presume, Courage  SONOGRAPHER  Belva Chimes  cc: ------------------------------------------------------------------- LV EF: 55% -   60% ------------------------------------------------------------------- Indications:      TIA 435.9. ------------------------------------------------------------------- Study Conclusions - Left ventricle: The cavity size was normal. Wall thickness was   normal. Systolic function was normal. The estimated ejection   fraction was in the range of 55% to 60%. Wall motion was normal;   there were no regional wall motion abnormalities. Doppler   parameters are consistent with abnormal left ventricular   relaxation (grade 1 diastolic dysfunction). - Aortic valve: Valve area (VTI): 2.42 cm^2. Valve area (Vmax):   2.38 cm^2. Valve area (Vmean): 2.23 cm^2. ------------------------------------------------------------------- Study data:  Comparison was made to the study of 03/02/2018.  Study status:  Routine.  Procedure:  Transthoracic echocardiography. Image quality was good.  Study completion:  There were no complications.          Transthoracic echocardiography.  2D, spectral Doppler, and color Doppler.  Birthdate:  Patient birthdate: 05-01-1931.  Age:  Patient is 87 yr old.  Sex:  Gender: female.    BMI: 21.1 kg/m^2.  Blood pressure:     131/72  Patient status:  Inpatient.  Study date:  Study date: 04/23/2018. Study time: 02:16 PM.  Location:  Bedside. ------------------------------------------------------------------- ------------------------------------------------------------------- Left ventricle:  The cavity size was normal. Wall thickness was normal. Systolic function was normal. The estimated ejection  fraction was in the range of 55% to 60%. Wall motion was normal; there were no regional wall motion abnormalities. Doppler parameters are consistent with abnormal left ventricular relaxation (grade 1 diastolic dysfunction). ------------------------------------------------------------------- Aortic valve:   Mildly calcified leaflets. Cusp separation was normal.  Doppler:  Transvalvular velocity was within the normal range. There was no stenosis. There was no regurgitation.    VTI ratio of LVOT to aortic valve: 0.77. Valve area (VTI): 2.42 cm^2. Indexed valve area (VTI): 1.64 cm^2/m^2. Peak velocity ratio of LVOT to aortic valve: 0.76. Valve area (Vmax): 2.38 cm^2. Indexed valve area (Vmax): 1.61 cm^2/m^2. Mean velocity ratio of LVOT to aortic valve: 0.71. Valve area (Vmean): 2.23 cm^2. Indexed valve area (Vmean): 1.51 cm^2/m^2.    Mean gradient (S): 3 mm Hg. Peak gradient (S): 5 mm Hg. ------------------------------------------------------------------- Aorta:  Aortic root: The aortic root was normal in size. Ascending aorta: The ascending aorta was normal in size. ------------------------------------------------------------------- Mitral valve:   Structurally normal valve.   Leaflet separation was normal.  Doppler:  Transvalvular velocity was within the normal range. There was no evidence for stenosis. There was no regurgitation. ------------------------------------------------------------------- Left atrium:  The atrium was normal in size. ------------------------------------------------------------------- Right ventricle:  The cavity size was normal. Wall thickness was normal. Systolic function was normal. ------------------------------------------------------------------- Pulmonic valve:   Poorly visualized.  Doppler:  There was no significant regurgitation. ------------------------------------------------------------------- Tricuspid valve:   Structurally normal valve.   Leaflet separation was normal.  Doppler:   Transvalvular velocity was within the normal range. There was mild regurgitation. ------------------------------------------------------------------- Right atrium:  The atrium was normal in size. ------------------------------------------------------------------- Pericardium:  There was no pericardial effusion. ------------------------------------------------------------------- Systemic veins: Inferior vena cava: The vessel was normal in size. The respirophasic diameter changes were in the normal range (>= 50%), consistent with normal central venous pressure. ------------------------------------------------------------------- Measurements  Left ventricle                           Value          Reference  LV ID, ED, PLAX chordal          (  L)     42.7  mm       43 - 52  LV ID, ES, PLAX chordal                  29.4  mm       23 - 38  LV fx shortening, PLAX chordal           31    %        >=29  LV PW thickness, ED                      4.55  mm       ----------  IVS/LV PW ratio, ED              (H)     1.58           <=1.3  Stroke volume, 2D                        65    ml       ----------  Stroke volume/bsa, 2D                    44    ml/m^2   ----------  LV e&', lateral                           5.87  cm/s     ----------  LV E/e&', lateral                         10.75          ----------  LV s&', lateral                           6.42  cm/s     ----------  LV e&', medial                            4.24  cm/s     ----------  LV E/e&', medial                          14.88          ----------  LV e&', average                           5.06  cm/s     ----------  LV E/e&', average                         12.48          ----------   Ventricular septum                       Value          Reference  IVS thickness, ED                        7.2   mm       ----------   LVOT  Value          Reference  LVOT ID, S                               20    mm       ----------  LVOT area                                 3.14  cm^2     ----------  LVOT peak velocity, S                    87.9  cm/s     ----------  LVOT mean velocity, S                    58.4  cm/s     ----------  LVOT VTI, S                              20.7  cm       ----------   Aortic valve                             Value          Reference  Aortic valve peak velocity, S            116   cm/s     ----------  Aortic valve mean velocity, S            82.4  cm/s     ----------  Aortic valve VTI, S                      26.9  cm       ----------  Aortic mean gradient, S                  3     mm Hg    ----------  Aortic peak gradient, S                  5     mm Hg    ----------  VTI ratio, LVOT/AV                       0.77           ----------  Aortic valve area, VTI                   2.42  cm^2     ----------  Aortic valve area/bsa, VTI               1.64  cm^2/m^2 ----------  Velocity ratio, peak, LVOT/AV            0.76           ----------  Aortic valve area, peak velocity         2.38  cm^2     ----------  Aortic valve area/bsa, peak              1.61  cm^2/m^2 ----------  velocity  Velocity ratio, mean, LVOT/AV            0.71           ----------  Aortic valve  area, mean velocity         2.23  cm^2     ----------  Aortic valve area/bsa, mean              1.51  cm^2/m^2 ----------  velocity   Aorta                                    Value          Reference  Aortic root ID, ED                       27    mm       ----------   Left atrium                              Value          Reference  LA ID, A-P, ES                           25    mm       ----------  LA ID/bsa, A-P                           1.69  cm/m^2   <=2.2  LA volume, S                             38.7  ml       ----------  LA volume/bsa, S                         26.2  ml/m^2   ----------  LA volume, ES, 1-p A4C                   31.7  ml       ----------  LA volume/bsa, ES, 1-p A4C               21.5  ml/m^2   ----------  LA volume, ES, 1-p A2C                   45.7  ml        ----------  LA volume/bsa, ES, 1-p A2C               30.9  ml/m^2   ----------   Mitral valve                             Value          Reference  Mitral E-wave peak velocity              63.1  cm/s     ----------  Mitral A-wave peak velocity              95.1  cm/s     ----------  Mitral deceleration time         (H)     363   ms       150 - 230  Mitral E/A ratio, peak                   0.7            ----------  Pulmonary veins                          Value          Reference  Pulmonary vein peak velocity, S          50    cm/s     ----------  Pulmonary vein peak velocity, D          28    cm/s     ----------  Pulmonary vein velocity ratio,           1.8            ----------  peak, S/D   Pulmonary arteries                       Value          Reference  PA pressure, S, DP                       27    mm Hg    <=30   Tricuspid valve                          Value          Reference  Tricuspid regurg peak velocity           247   cm/s     ----------  Tricuspid peak RV-RA gradient            24    mm Hg    ----------   Right atrium                             Value          Reference  RA ID, S-I, ES, A4C              (H)     49.8  mm       34 - 49  RA area, ES, A4C                         13.2  cm^2     8.3 - 19.5  RA volume, ES, A/L                       29    ml       ----------  RA volume/bsa, ES, A/L                   19.6  ml/m^2   ----------   Systemic veins                           Value          Reference  Estimated CVP                            3     mm Hg    ----------   Right ventricle                          Value          Reference  RV ID, ED, PLAX  33.1  mm       19 - 38  TAPSE                                    19.2  mm       ----------  RV pressure, S, DP                       27    mm Hg    <=30  RV s&', lateral, S                        10.4  cm/s     ----------   Pulmonic valve                           Value          Reference  Pulmonic valve peak velocity, S           84.2  cm/s     ---------- Legend: (L)  and  (H)  mark values outside specified reference range. ------------------------------------------------------------------- Prepared and Electronically Authenticated by Nicholes Mango, MD 2019-08-10T15:35:38   Assessment & Plan:     Follow-up: No follow-ups on file.  Sonda Primes, MD

## 2021-03-11 NOTE — Assessment & Plan Note (Signed)
On Crestor 

## 2021-03-11 NOTE — Assessment & Plan Note (Signed)
Cont on Norvasc, Atenolol 

## 2021-03-11 NOTE — Assessment & Plan Note (Signed)
On Keppra  F/u w/Neurol pending

## 2021-03-11 NOTE — Assessment & Plan Note (Signed)
Check A1c. 

## 2021-03-27 ENCOUNTER — Encounter: Payer: Self-pay | Admitting: Adult Health

## 2021-03-27 ENCOUNTER — Ambulatory Visit: Payer: Medicare Other | Admitting: Adult Health

## 2021-03-27 ENCOUNTER — Other Ambulatory Visit: Payer: Self-pay

## 2021-03-27 VITALS — BP 130/74 | HR 60 | Ht 60.0 in | Wt 110.8 lb

## 2021-03-27 DIAGNOSIS — G40109 Localization-related (focal) (partial) symptomatic epilepsy and epileptic syndromes with simple partial seizures, not intractable, without status epilepticus: Secondary | ICD-10-CM

## 2021-03-27 MED ORDER — LEVETIRACETAM 500 MG PO TABS: 500.0000 mg | ORAL_TABLET | Freq: Every morning | ORAL | 0 refills | Status: DC

## 2021-03-27 MED ORDER — LEVETIRACETAM 750 MG PO TABS: 750.0000 mg | ORAL_TABLET | Freq: Every day | ORAL | 0 refills | Status: DC

## 2021-03-27 NOTE — Progress Notes (Signed)
I agree with the above plan 

## 2021-03-27 NOTE — Patient Instructions (Addendum)
Your Plan:  We will try to decrease Keppra dose to 500mg  in the morning and continue 750mg  at bedtime  Please continue this dose for additional 3 months then you can further decrease to 500mg  twice daily    Follow up in 6 months or call earlier if needed       Thank you for coming to see at [facility]. I hope we have been able to provide you high quality care today.  You may receive a patient satisfaction survey over the next few weeks. We would appreciate your feedback and comments so that we may continue to improve ourselves and the health of our patients.

## 2021-03-27 NOTE — Progress Notes (Signed)
[facility] [address] [place]. [place] [address] [phone]       OFFICE FOLLOW UP NOTE  Ms. CAELEY DOHRMANN Date of Birth:  1930/09/18 Medical Record Number:  [id number]   Referring MD:  Albertine Grates  Reason for Referral:  seizure   CC: Seizures   HPI  Today, 03/27/2021, Ms. Trefz returns for yearly seizure follow-up.  Stable since prior visit without recurrent seizure activity.  Remains on Keppra without side effects.  She does question potentially lowering dosage as she has not had any seizure activity over the past 3 years.  She remains active maintaining all ADLs and IADLs independently.  No further concerns at this time.   History provided for reference purposes only Update 03/27/2020 JM: Ms. Abraha returns for seizure follow-up.  She has been doing well since prior visit without recurrent seizure activity or events.  Remains on Keppra 750 mg twice daily tolerating dosage without side effects.  Currently following with PCP for lower extremity pains and fatigue with Crestor currently on hold for possible medication side effect.  She does have follow-up in the near future.  Follows with cardiology regularly.  Blood pressure stable at 130/70.  No concerns at this time.  Update 03/27/2019 [name]: She returns for follow-up after last visit in December 2019 with [name], nurse practitioner.  She is doing well and has not had any recurrent episodes of sensory seizures or any other neurological issues.  She remains on Keppra 750 mg twice daily which she is tolerating well without dizziness or other side effects.  She woke up 1 day about a month ago with feeling of coldness and heaviness in both her feet but this does not last long and cleared in an hour or 2.  She did not seek medical help.  She saw her primary care physician few weeks ago who did some lab work all of which was satisfactory.  She states her blood pressure usually runs good but today it is slightly elevated  in our office and 155/80 she feels this may be due to white coat hypertension.  She has no new complaints today.  Interval history 08/30/2018 [name] is being seen today for 37-month follow-up visit.  She did undergo repeat EEG which was normal without evidence of seizure activity.  She did increase keppra after prior appointment.  Approximately 3 to 4 weeks ago, patient did experience episode of left hand numbness which lasted approximately 15 seconds and then resolved.  She denies any additional episode occurring.  She does endorse fatigue during the day and tiring quicker than before but she is unsure if this is due to medications versus old age.  She does experience some unsteadiness at times but was also experiencing this prior to her [facility] and being on keppra.  She did undergo a couple physical therapy sessions but financially was unable to continue.  She did receive handout regarding exercises to do at home to assist with balance but she does admit to not doing this.  She does state that she consistently stays busy as she does live independently and manages all ADLs and IADLs.  She is able to continue to ambulate without assistive device and denies any recent falls.  She also states that intermittently she will have a burning sensation/sharp pinprick sensation on her left middle finger knuckle and left AC area which may only last for 1 to 2 minutes and then subside.  She believes that this pain could be due  to arthritis and is not overly concerned as it happens infrequently.  Pressure today 141/80.  She continues on Crestor for HLD management.  No further concerns at this time.  Denies new or worsening stroke/TIA symptoms or seizure activity.   initial visit 05/26/2018 PS: Ms Oravec is a pleasant 78 year Caucasian lady seen today for initial consultation visit following hospital admission recently for paresthesias. History is provided by the patient and review of electronic medical records. I personally  reviewed imaging films.CLAUDENE GATLIFF is an 87 y.o. female past medical history of hypertension, hyperlipidemia, diabetes mellitus, atrial mass who presents to the emergency room on 04/22/18 after having multiple spells of numbness over the left hand extending to the arm as well as left facial numbness.She stated her first episode was about 1 week ago lasting about 15 minutes mainly if he affecting her left hand, arm as well as the left side of her face. Resolved spontaneously.  Continued to have 3 more episodes and therefore today she decided to come to the hospital.  She denies any weakness, however states she was not able to use her hand like she normally could.  Patient denies any jerking/twitching movements of the left arm or face.  Work-up in the ER included MRI brain which revealed a small area of subarachnoid hemorrhage on MRI brain in the right frontal lobe convexity only. This was difficult to visualize on the CT scan corresponding cuts..  Her CT scan was   negative h .  She denied any history of recent head trauma, however she had a fall few weeks ago. She was on ASA at home. EEG done on 04/23/18 was normal. Transthoracic echo showed normal ejection fraction. Patient was started on Keppra 500 twice daily and had no further episodes after that was started. 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 Neurosurgery was consulted and recommended conservative follow-up for the same. Patient states she was doing well after discharge until 5 days ago when she had 2 transient episodes of numbness in her left hand gradually going up to her left elbow but this time not involving her face. This occurred twice on one day and then once the next day. She has not had these episodes for the last 3 days at least. She remains on Keppra 500 twice daily which is tolerating well without any side effects. She  states she has not missed any doses.     ROS:   14 system review of systems is positive for those listed in HPI and all other systems negative  PMH:  Past Medical History:  Diagnosis Date   Allergy    Anxiety    Atrial mass    right   Constipation    Depression    Diabetes mellitus    Hyperlipidemia    Hypertension    Osteoporosis    Seizures (HCC)     Social History:  Social History   Socioeconomic History   Marital status: Single    Spouse name: Not on file   Number of children: 1   Years of education: Not on file   Highest education level: 12th grade  Occupational History   Occupation: retired  Tobacco Use   Smoking status: Former    Packs/day: 1.00    Years: 20.00    Pack years: 20.00    Types: Cigarettes    Quit date: 03/13/1998  Years since quitting: 23.0   Smokeless tobacco: Never  Vaping Use   Vaping Use: Never used  Substance and Sexual Activity   Alcohol use: No   Drug use: No   Sexual activity: Not Currently  Other Topics Concern   Not on file  Social History Narrative   Regular exercise-yes      Daily caffeine use      Right handed       03/27/21 Lives at home alone    Social Determinants of Health   Financial Resource Strain: Not on file  Food Insecurity: Not on file  Transportation Needs: Not on file  Physical Activity: Not on file  Stress: Not on file  Social Connections: Not on file  Intimate Partner Violence: Not on file    Medications:   Current Outpatient Medications on File Prior to Visit  Medication Sig Dispense Refill   acetaminophen (TYLENOL 8 HOUR) 650 MG CR tablet Take 1 tablet (650 mg total) by mouth every 8 (eight) hours as needed for pain. 100 tablet 3   amLODipine (NORVASC) 5 MG tablet TAKE ONE TABLET BY MOUTH DAILY 90 tablet 1   atenolol (TENORMIN) 100 MG tablet TAKE (1/2) TABLET TWICE DAILY. 90 tablet 3   Cholecalciferol (VITAMIN D3) 50 MCG (2000 UT) capsule Take 1 capsule (2,000 Units total) by mouth daily.  100 capsule 3   loratadine (CLARITIN) 10 MG tablet Take 1 tablet (10 mg total) by mouth daily. 30 tablet 5   Multiple Vitamins-Minerals (CENTRUM SILVER) CHEW Chew 1 each by mouth 3 (three) times a week.     PARoxetine (PAXIL) 10 MG tablet TAKE 1 TABLET IN THE MORNING. 90 tablet 1   rosuvastatin (CRESTOR) 20 MG tablet Take 10 mg on Mon-Wed-Fri 19 tablet 3   No current facility-administered medications on file prior to visit.    Allergies:   Allergies  Allergen Reactions   Captopril     REACTION: cough     Vitals Today's Vitals   03/27/21 1422  BP: 130/74  Pulse: 60  Weight: 110 lb 12.8 oz (50.3 kg)  Height: 5' (1.524 m)    Body mass index is 21.64 kg/m.   Physical Exam General: frail petite very pleasant elderly Caucasian lady, seated, in no evident distress Head: head normocephalic and atraumatic.   Neck: supple with no carotid or supraclavicular bruits Cardiovascular: regular rate and rhythm, no murmurs Musculoskeletal: mild kyphosis Skin:  no rash/petichiae Vascular:  Normal pulses all extremities  Neurologic Exam Mental Status: Awake and fully alert.  Fluent speech and language.  Oriented to place and time. Recent and remote memory intact. Attention span, concentration and fund of knowledge appropriate. Mood and affect appropriate.  Cranial Nerves: Pupils equal, briskly reactive to light. Extraocular movements full without nystagmus. Visual fields full to confrontation. Hearing diminished bilaterally. Facial sensation intact. Face, tongue, palate moves normally and symmetrically.  Motor: Normal bulk and tone. Normal strength in all tested extremity muscles. Sensory.: intact to touch , pinprick , position and vibratory sensation.  Coordination: Rapid alternating movements normal in all extremities. Finger-to-nose and heel-to-shin performed accurately bilaterally. Gait and Station: Arises from chair without difficulty. Stance is normal. Gait demonstrates normal stride  length and balance without use of assistive device.  Unable to heel, toe and tandem walk due to difficulty Reflexes: 1+ and symmetric. Toes downgoing.      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    PLAN: -As stable without recurrent seizures since 2019, we will try to lower keppra dosage slowly - recommend lowering AM dose from 750mg  to 500mg  AM and continue 750mg  PM dosage; after 3 months can further lower to 500mg  BID -discussed possibility of breakthrough seizures with lowering dosage - she verbalized understanding and wishes to proceed -advised to call office with any questions or concerns     Follow-up in 6 months or call earlier if needed  CC:  [facility] provider: Dr. , , MD    I spent 26 minutes of face-to-face and non-face-to-face time with patient.  This included previsit chart review, lab review, study review, order entry, electronic health record documentation, patient education regarding seizures currently managed on Keppra, lowering AED dosage and potential risks, and answered all other questions to patient satisfaction   , [facility]  [facility] [address] [place], [place] [facility]  Phone [phone] Fax [phone] Note: This document was prepared with digital dictation and possible smart phrase technology. Any transcriptional errors that result from this process are unintentional.

## 2021-04-04 ENCOUNTER — Telehealth: Payer: Self-pay | Admitting: Adult Health

## 2021-04-04 NOTE — Telephone Encounter (Signed)
Pt called, having reaction to medication for last 2 days. Shaky, not feeling good. Spoke with pharmacy, they said to go back to 750 mg twice a day until you speak with your physician.  Would like a call from the nurse. Pt did not say the name of the medication

## 2021-04-04 NOTE — Telephone Encounter (Signed)
I called the pt. Pt reports over the last 2 days she has felt shaky and not feeling well.  Pt was advised by her Pharmacy to go back to Keppra 750 mg bid. Pt sts she took this dosage this morning and felt better.  Pt advised to continue this dosage over the weekend and then I would speak with [name] on Monday about next steps. Pt agreeable to this plan.

## 2021-04-07 MED ORDER — LEVETIRACETAM 750 MG PO TABS: 750.0000 mg | ORAL_TABLET | Freq: Two times a day (BID) | ORAL | 3 refills | Status: DC

## 2021-04-07 NOTE — Telephone Encounter (Signed)
LVM informing patient of NP's message and new Rx sent in. Left # for questions.

## 2021-04-07 NOTE — Telephone Encounter (Signed)
Agree with continuing Keppra 750 mg twice daily as she was not doing well with lowering dosage.  Updated order will be sent to pharmacy.  Thank

## 2021-04-07 NOTE — Telephone Encounter (Signed)
Patient called back, and I reviewed NP's message with her. Patient stated she is doing better, verbalized understanding, appreciation.

## 2021-05-11 ENCOUNTER — Other Ambulatory Visit: Payer: Self-pay | Admitting: Internal Medicine

## 2021-05-24 ENCOUNTER — Other Ambulatory Visit: Payer: Self-pay | Admitting: Internal Medicine

## 2021-06-18 ENCOUNTER — Other Ambulatory Visit: Payer: Self-pay | Admitting: Internal Medicine

## 2021-07-16 ENCOUNTER — Ambulatory Visit (INDEPENDENT_AMBULATORY_CARE_PROVIDER_SITE_OTHER): Payer: Medicare Other

## 2021-07-16 ENCOUNTER — Other Ambulatory Visit: Payer: Self-pay

## 2021-07-16 VITALS — BP 130/80 | HR 65 | Temp 98.1°F | Ht 60.0 in | Wt 107.4 lb

## 2021-07-16 DIAGNOSIS — Z0000 Encounter for general adult medical examination without abnormal findings: Secondary | ICD-10-CM | POA: Diagnosis not present

## 2021-07-16 NOTE — Patient Instructions (Signed)
Vickie Wilson , Thank you for taking time to come for your Medicare Wellness Visit. I appreciate your ongoing commitment to your health goals. Please review the following plan we discussed and let me know if I can assist you in the future.   Screening recommendations/referrals: Colonoscopy: Not a candidate for screening due to age Mammogram: Not a candidate for screening due to age Bone Density: Not a candidate for screening due to age Recommended yearly ophthalmology/optometry visit for glaucoma screening and checkup Recommended yearly dental visit for hygiene and checkup  Vaccinations: Influenza vaccine: 09/19/2020 Pneumococcal vaccine: 12/08/2013, 08/11/2017 Tdap vaccine: 07/31/2017; due every 10 years Shingles vaccine: never done   Covid-19: 10/05/2019, 10/26/2019, 06/18/2020  Advanced directives: Yes; Client understands the importance of follow-up with providers by attending scheduled visits and discussed goals to eat healthier, increase physical activity, exercise the brain, socialize more, get enough sleep and make time for laughter.  Conditions/risks identified: Yes; Client understands the importance of follow-up with providers by attending scheduled visits and discussed goals to eat healthier, increase physical activity, exercise the brain, socialize more, get enough sleep and make time for laughter.  Next appointment: Please schedule your next Medicare Wellness Visit with your Nurse Health Advisor in 1 year by calling [phone].   Preventive Care 45 Years and Older, Female Preventive care refers to lifestyle choices and visits with your health care provider that can promote health and wellness. What does preventive care include? A yearly physical exam. This is also called an annual well check. Dental exams once or twice a year. Routine eye exams. Ask your health care provider how often you should have your eyes checked. Personal lifestyle choices, including: Daily care of your teeth  and gums. Regular physical activity. Eating a healthy diet. Avoiding tobacco and drug use. Limiting alcohol use. Practicing safe sex. Taking low-dose aspirin every day. Taking vitamin and mineral supplements as recommended by your health care provider. What happens during an annual well check? The services and screenings done by your health care provider during your annual well check will depend on your age, overall health, lifestyle risk factors, and family history of disease. Counseling  Your health care provider may ask you questions about your: Alcohol use. Tobacco use. Drug use. Emotional well-being. Home and relationship well-being. Sexual activity. Eating habits. History of falls. Memory and ability to understand (cognition). Work and work Astronomer. Reproductive health. Screening  You may have the following tests or measurements: Height, weight, and BMI. Blood pressure. Lipid and cholesterol levels. These may be checked every 5 years, or more frequently if you are over 73 years old. Skin check. Lung cancer screening. You may have this screening every year starting at age 6 if you have a 30-pack-year history of smoking and currently smoke or have quit within the past 15 years. Fecal occult blood test (FOBT) of the stool. You may have this test every year starting at age 45. Flexible sigmoidoscopy or colonoscopy. You may have a sigmoidoscopy every 5 years or a colonoscopy every 10 years starting at age 38. Hepatitis C blood test. Hepatitis B blood test. Sexually transmitted disease (STD) testing. Diabetes screening. This is done by checking your blood sugar (glucose) after you have not eaten for a while (fasting). You may have this done every 1-3 years. Bone density scan. This is done to screen for osteoporosis. You may have this done starting at age 70. Mammogram. This may be done every 1-2 years. Talk to your health care provider about how often  you should have regular  mammograms. Talk with your health care provider about your test results, treatment options, and if necessary, the need for more tests. Vaccines  Your health care provider may recommend certain vaccines, such as: Influenza vaccine. This is recommended every year. Tetanus, diphtheria, and acellular pertussis (Tdap, Td) vaccine. You may need a Td booster every 10 years. Zoster vaccine. You may need this after age 94. Pneumococcal 13-valent conjugate (PCV13) vaccine. One dose is recommended after age 3. Pneumococcal polysaccharide (PPSV23) vaccine. One dose is recommended after age 101. Talk to your health care provider about which screenings and vaccines you need and how often you need them. This information is not intended to replace advice given to you by your health care provider. Make sure you discuss any questions you have with your health care provider. Document Released: 09/27/2015 Document Revised: 05/20/2016 Document Reviewed: 07/02/2015 Elsevier Interactive Patient Education  2017 [employer] Prevention in the Home Falls can cause injuries. They can happen to people of all ages. There are many things you can do to make your home safe and to help prevent falls. What can I do on the outside of my home? Regularly fix the edges of walkways and driveways and fix any cracks. Remove anything that might make you trip as you walk through a door, such as a raised step or threshold. Trim any bushes or trees on the path to your home. Use bright outdoor lighting. Clear any walking paths of anything that might make someone trip, such as rocks or tools. Regularly check to see if handrails are loose or broken. Make sure that both sides of any steps have handrails. Any raised decks and porches should have guardrails on the edges. Have any leaves, snow, or ice cleared regularly. Use sand or salt on walking paths during winter. Clean up any spills in your garage right away. This includes oil  or grease spills. What can I do in the bathroom? Use night lights. Install grab bars by the toilet and in the tub and shower. Do not use towel bars as grab bars. Use non-skid mats or decals in the tub or shower. If you need to sit down in the shower, use a plastic, non-slip stool. Keep the floor dry. Clean up any water that spills on the floor as soon as it happens. Remove soap buildup in the tub or shower regularly. Attach bath mats securely with double-sided non-slip rug tape. Do not have throw rugs and other things on the floor that can make you trip. What can I do in the bedroom? Use night lights. Make sure that you have a light by your bed that is easy to reach. Do not use any sheets or blankets that are too big for your bed. They should not hang down onto the floor. Have a firm chair that has side arms. You can use this for support while you get dressed. Do not have throw rugs and other things on the floor that can make you trip. What can I do in the kitchen? Clean up any spills right away. Avoid walking on wet floors. Keep items that you use a lot in easy-to-reach places. If you need to reach something above you, use a strong step stool that has a grab bar. Keep electrical cords out of the way. Do not use floor polish or wax that makes floors slippery. If you must use wax, use non-skid floor wax. Do not have throw rugs and other things on  the floor that can make you trip. What can I do with my stairs? Do not leave any items on the stairs. Make sure that there are handrails on both sides of the stairs and use them. Fix handrails that are broken or loose. Make sure that handrails are as long as the stairways. Check any carpeting to make sure that it is firmly attached to the stairs. Fix any carpet that is loose or worn. Avoid having throw rugs at the top or bottom of the stairs. If you do have throw rugs, attach them to the floor with carpet tape. Make sure that you have a light  switch at the top of the stairs and the bottom of the stairs. If you do not have them, ask someone to add them for you. What else can I do to help prevent falls? Wear shoes that: Do not have high heels. Have rubber bottoms. Are comfortable and fit you well. Are closed at the toe. Do not wear sandals. If you use a stepladder: Make sure that it is fully opened. Do not climb a closed stepladder. Make sure that both sides of the stepladder are locked into place. Ask someone to hold it for you, if possible. Clearly mark and make sure that you can see: Any grab bars or handrails. First and last steps. Where the edge of each step is. Use tools that help you move around (mobility aids) if they are needed. These include: Canes. Walkers. Scooters. Crutches. Turn on the lights when you go into a dark area. Replace any light bulbs as soon as they burn out. Set up your furniture so you have a clear path. Avoid moving your furniture around. If any of your floors are uneven, fix them. If there are any pets around you, be aware of where they are. Review your medicines with your doctor. Some medicines can make you feel dizzy. This can increase your chance of falling. Ask your doctor what other things that you can do to help prevent falls. This information is not intended to replace advice given to you by your health care provider. Make sure you discuss any questions you have with your health care provider. Document Released: 06/27/2009 Document Revised: 02/06/2016 Document Reviewed: 10/05/2014 Elsevier Interactive Patient Education  2017 [employer].

## 2021-07-16 NOTE — Progress Notes (Addendum)
Subjective:   Vickie Wilson is a 87 y.o. female who presents for Medicare Annual (Subsequent) preventive examination.  Review of Systems     Cardiac Risk Factors include: advanced age (>53men, >72 women);dyslipidemia;family history of premature cardiovascular disease;hypertension     Objective:    Today's Vitals   07/16/21 1635  BP: 130/80  Pulse: 65  Temp: 98.1 F (36.7 C)  SpO2: 99%  Weight: 107 lb 6.4 oz (48.7 kg)  Height: 5' (1.524 m)  PainSc: 0-No pain   Body mass index is 20.98 kg/m.  Advanced Directives 07/16/2021 01/04/2020 10/05/2018 05/13/2018 04/22/2018 04/22/2018 02/12/2018  Does Patient Have a Medical Advance Directive? Yes Yes Yes Yes Yes Yes No  Type of Advance Directive Living will Healthcare Power of [place];Living will Healthcare Power of [place];Living will Healthcare Power of [place];Living will - Healthcare Power of [place];Living will -  Does patient want to make changes to medical advance directive? No - Patient declined No - Patient declined - No - Patient declined No - Patient declined - -  Copy of Healthcare Power of Attorney in Chart? - No - copy requested No - copy requested No - copy requested - No - copy requested -  Would patient like information on creating a medical advance directive? - - - - - - No - Patient declined    Current Medications (verified) Outpatient Encounter Medications as of 07/16/2021  Medication Sig   acetaminophen (TYLENOL 8 HOUR) 650 MG CR tablet Take 1 tablet (650 mg total) by mouth every 8 (eight) hours as needed for pain.   amLODipine (NORVASC) 5 MG tablet TAKE ONE TABLET BY MOUTH DAILY   atenolol (TENORMIN) 100 MG tablet TAKE (1/2) TABLET TWICE DAILY.   Cholecalciferol (VITAMIN D3) 50 MCG (2000 UT) capsule Take 1 capsule (2,000 Units total) by mouth daily.   levETIRAcetam (KEPPRA) 750 MG tablet Take 1 tablet (750 mg total) by mouth 2 (two) times daily.   Multiple Vitamins-Minerals (CENTRUM SILVER) CHEW Chew 1 each by mouth  3 (three) times a week.   PARoxetine (PAXIL) 10 MG tablet TAKE 1 TABLET IN THE MORNING.   rosuvastatin (CRESTOR) 20 MG tablet Take 10 mg on Mon-Wed-Fri   loratadine (CLARITIN) 10 MG tablet Take 1 tablet (10 mg total) by mouth daily. (Patient not taking: Reported on 07/16/2021)   No facility-administered encounter medications on file as of 07/16/2021.    Allergies (verified) Captopril   History: Past Medical History:  Diagnosis Date   Allergy    Anxiety    Atrial mass    right   Constipation    Depression    Diabetes mellitus    Hyperlipidemia    Hypertension    Osteoporosis    Seizures (HCC)    Past Surgical History:  Procedure Laterality Date   DILATION AND CURETTAGE OF UTERUS  1987   Family History  Problem Relation Age of Onset   Heart disease Mother    Alcohol abuse Father    Heart disease Father    Hyperlipidemia Other    Hypertension Other    Stroke Other    Social History   Socioeconomic History   Marital status: Single    Spouse name: Not on file   Number of children: 1   Years of education: Not on file   Highest education level: 12th grade  Occupational History   Occupation: retired  Tobacco Use   Smoking status: Former    Packs/day: 1.00    Years: 20.00  Pack years: 20.00    Types: Cigarettes    Quit date: 03/13/1998    Years since quitting: 23.3   Smokeless tobacco: Never  Vaping Use   Vaping Use: Never used  Substance and Sexual Activity   Alcohol use: No   Drug use: No   Sexual activity: Not Currently  Other Topics Concern   Not on file  Social History Narrative   Regular exercise-yes      Daily caffeine use      Right handed       03/27/21 Lives at home alone    Social Determinants of Health   Financial Resource Strain: Low Risk    Difficulty of Paying Living Expenses: Not hard at all  Food Insecurity: No Food Insecurity   Worried About Programme researcher, broadcasting/film/videounning Out of Food in the Last Year: Never true   Ran Out of Food in the Last Year:  Never true  Transportation Needs: No Transportation Needs   Lack of Transportation (Medical): No   Lack of Transportation (Non-Medical): No  Physical Activity: Sufficiently Active   Days of Exercise per Week: 5 days   Minutes of Exercise per Session: 30 min  Stress: No Stress Concern Present   Feeling of Stress : Not at all  Social Connections: Moderately Integrated   Frequency of Communication with Friends and Family: More than three times a week   Frequency of Social Gatherings with Friends and Family: More than three times a week   Attends Religious Services: More than 4 times per year   Active Member of [employer] or Organizations: Yes   Attends BankerClub or Organization Meetings: More than 4 times per year   Marital Status: Widowed    Tobacco Counseling Counseling given: Not Answered   Clinical Intake:  Pre-visit preparation completed: Yes  Pain : No/denies pain Pain Score: 0-No pain     BMI - recorded: 20.98 Nutritional Status: BMI of 19-24  Normal Nutritional Risks: None Diabetes: No  How often do you need to have someone help you when you read instructions, pamphlets, or other written materials from your doctor or pharmacy?: 1 - Never What is the last grade level you completed in school?: High School Graduate  Diabetic? no  Interpreter Needed?: No  Information entered by :: Susie CassetteShenika Claudie Rathbone, LPN   Activities of Daily Living In your present state of health, do you have any difficulty performing the following activities: 07/16/2021  Hearing? N  Vision? N  Difficulty concentrating or making decisions? Y  Walking or climbing stairs? N  Dressing or bathing? N  Doing errands, shopping? N  Preparing Food and eating ? N  Using the Toilet? N  In the past six months, have you accidently leaked urine? N  Do you have problems with loss of bowel control? N  Managing your Medications? N  Managing your Finances? N  Housekeeping or managing your Housekeeping? N  Some recent  data might be hidden    Patient Care Team: Plotnikov, Georgina QuintAleksei V, MD as PCP - General [place], Peter M, MD as Consulting Physician (Cardiology) Maris BergerMcCuen, Christine, MD as Consulting Physician (Ophthalmology) Micki RileySethi, Pramod S, MD as Consulting Physician (Neurology)  Indicate any recent Medical Services you may have received from other than [facility] providers in the past year (date may be approximate).     Assessment:   This is a routine wellness examination for Vickie PihJackie.  Hearing/Vision screen No results found.  Dietary issues and exercise activities discussed: Current Exercise Habits: Home exercise routine, Type of  exercise: walking, Time (Minutes): 30, Frequency (Times/Week): 5, Weekly Exercise (Minutes/Week): 150, Intensity: Moderate, Exercise limited by: neurologic condition(s)   Goals Addressed   None   Depression Screen PHQ 2/9 Scores 07/16/2021 03/20/2020 12/18/2019 10/05/2018 08/11/2017 03/04/2016 10/30/2014  PHQ - 2 Score 1 0 0 1 0 0 0  PHQ- 9 Score - 0 - 2 - - -    Fall Risk Fall Risk  07/16/2021 12/18/2019 10/05/2018 08/11/2017 03/04/2016  Falls in the past year? 0 0 0 No No  Number falls in past yr: 0 0 - - -  Injury with Fall? 0 0 - - -  Risk for fall due to : No Fall Risks - Impaired balance/gait;Impaired mobility - -  Follow up Falls evaluation completed - - - -    FALL RISK PREVENTION PERTAINING TO THE HOME:  Any stairs in or around the home? Yes  If so, are there any without handrails? No  Home free of loose throw rugs in walkways, pet beds, electrical cords, etc? Yes  Adequate lighting in your home to reduce risk of falls? Yes   ASSISTIVE DEVICES UTILIZED TO PREVENT FALLS:  Life alert? No  Use of a cane, walker or w/c? No  Grab bars in the bathroom? No  Shower chair or bench in shower? No  Elevated toilet seat or a handicapped toilet? No   TIMED UP AND GO:  Was the test performed? Yes .  Length of time to ambulate 10 feet: 8 sec.   Gait steady and fast without use  of assistive device  Cognitive Function:Normal cognitive status assessed by direct observation by this Nurse Health Advisor. No abnormalities found.   MMSE - Mini Mental State Exam 10/06/2018  Not completed: Refused        Immunizations Immunization History  Administered Date(s) Administered   Fluad Quad(high Dose 65+) 05/18/2019, 09/19/2020   Influenza Split 06/23/2011, 07/19/2012   Influenza Whole 06/21/2008, 07/12/2009, 07/30/2010   Influenza, High Dose Seasonal PF 09/02/2016, 05/27/2017, 06/15/2018   Influenza,inj,Quad PF,6+ Mos 05/23/2013, 06/22/2014, 06/14/2015   PFIZER(Purple Top)SARS-COV-2 Vaccination 10/05/2019, 10/26/2019, 06/18/2020   Pneumococcal Conjugate-13 12/08/2013   Pneumococcal Polysaccharide-23 08/01/2007, 08/11/2017   Td 04/30/2010   Tdap 07/31/2017    TDAP status: Up to date  Flu Vaccine status: Up to date  Pneumococcal vaccine status: Up to date  Covid-19 vaccine status: Completed vaccines  Qualifies for Shingles Vaccine? Yes   Zostavax completed No   Shingrix Completed?: No.    Education has been provided regarding the importance of this vaccine. Patient has been advised to call insurance company to determine out of pocket expense if they have not yet received this vaccine. Advised may also receive vaccine at local pharmacy or Health Dept. Verbalized acceptance and understanding.  Screening Tests Health Maintenance  Topic Date Due   FOOT EXAM  Never done   Zoster Vaccines- Shingrix (1 of 2) Never done   DEXA SCAN  Never done   URINE MICROALBUMIN  09/17/2017   OPHTHALMOLOGY EXAM  09/24/2019   HEMOGLOBIN A1C  02/20/2020   COVID-19 Vaccine (4 - Booster for Pfizer series) 08/13/2020   INFLUENZA VACCINE  04/14/2021   TETANUS/TDAP  08/01/2027   Pneumonia Vaccine 69+ Years old  Completed   HPV VACCINES  Aged Out    Health Maintenance  Health Maintenance Due  Topic Date Due   FOOT EXAM  Never done   Zoster Vaccines- Shingrix (1 of 2) Never done    DEXA SCAN  Never done  URINE MICROALBUMIN  09/17/2017   OPHTHALMOLOGY EXAM  09/24/2019   HEMOGLOBIN A1C  02/20/2020   COVID-19 Vaccine (4 - Booster for Pfizer series) 08/13/2020   INFLUENZA VACCINE  04/14/2021    Colorectal cancer screening: No longer required.   Mammogram status: No longer required due to age.  Bone density: never done  Lung Cancer Screening: (Low Dose CT Chest recommended if Age 51-80 years, 30 pack-year currently smoking OR have quit w/in 15years.) does not qualify.   Lung Cancer Screening Referral: no  Additional Screening:  Hepatitis C Screening: does not qualify; Completed no  Vision Screening: Recommended annual ophthalmology exams for early detection of glaucoma and other disorders of the eye. Is the patient up to date with their annual eye exam?  Yes  Who is the provider or what is the name of the office in which the patient attends annual eye exams? Luberta Mutter, MD. If pt is not established with a provider, would they like to be referred to a provider to establish care? No .   Dental Screening: Recommended annual dental exams for proper oral hygiene  Community Resource Referral / Chronic Care Management: CRR required this visit?  No   CCM required this visit?  No      Plan:     I have personally reviewed and noted the following in the patient's chart:   Medical and social history Use of alcohol, tobacco or illicit drugs  Current medications and supplements including opioid prescriptions.  Functional ability and status Nutritional status Physical activity Advanced directives List of other physicians Hospitalizations, surgeries, and ER visits in previous 12 months Vitals Screenings to include cognitive, depression, and falls Referrals and appointments  In addition, I have reviewed and discussed with patient certain preventive protocols, quality metrics, and best practice recommendations. A written personalized care plan for  preventive services as well as general preventive health recommendations were provided to patient.     Sheral Flow, LPN   [id number]   Nurse Notes: n/a   Medical screening examination/treatment/procedure(s) were performed by non-physician practitioner and as supervising physician I was immediately available for consultation/collaboration.  I agree with above. Lew Dawes, MD

## 2021-07-30 DIAGNOSIS — H26492 Other secondary cataract, left eye: Secondary | ICD-10-CM | POA: Diagnosis not present

## 2021-07-30 DIAGNOSIS — H52203 Unspecified astigmatism, bilateral: Secondary | ICD-10-CM | POA: Diagnosis not present

## 2021-07-30 DIAGNOSIS — H353111 Nonexudative age-related macular degeneration, right eye, early dry stage: Secondary | ICD-10-CM | POA: Diagnosis not present

## 2021-08-05 ENCOUNTER — Ambulatory Visit: Payer: Medicare Other

## 2021-08-25 ENCOUNTER — Other Ambulatory Visit: Payer: Self-pay

## 2021-08-25 ENCOUNTER — Ambulatory Visit: Payer: Medicare Other | Attending: Internal Medicine

## 2021-08-25 ENCOUNTER — Other Ambulatory Visit (HOSPITAL_BASED_OUTPATIENT_CLINIC_OR_DEPARTMENT_OTHER): Payer: Self-pay

## 2021-08-25 DIAGNOSIS — Z23 Encounter for immunization: Secondary | ICD-10-CM

## 2021-08-25 MED ORDER — PFIZER COVID-19 VAC BIVALENT 30 MCG/0.3ML IM SUSP
INTRAMUSCULAR | 0 refills | Status: DC
  Filled 2021-08-25: qty 0.3, 1d supply, fill #0

## 2021-08-25 NOTE — Progress Notes (Signed)
   Covid-19 Vaccination Clinic  Name:  Vickie Wilson    MRN: [id number] DOB: [birth year]  08/25/2021  [name] was observed post Covid-19 immunization for 15 minutes without incident. She was provided with Vaccine Information Sheet and instruction to access the V-Safe system.   [name] was instructed to call 911 with any severe reactions post vaccine: Difficulty breathing  Swelling of face and throat  A fast heartbeat  A bad rash all over body  Dizziness and weakness   Immunizations Administered     Name Date Dose VIS Date Route   Pfizer Covid-19 Vaccine Bivalent Booster 08/25/2021  3:46 PM 0.3 mL 05/14/2021 Intramuscular   Manufacturer: [employer], [employer]   Lot: [id number]   NDC: [phone]

## 2021-10-07 ENCOUNTER — Ambulatory Visit: Payer: Medicare Other | Admitting: Adult Health

## 2021-10-07 ENCOUNTER — Other Ambulatory Visit: Payer: Self-pay

## 2021-10-07 ENCOUNTER — Encounter: Payer: Self-pay | Admitting: Adult Health

## 2021-10-07 VITALS — BP 140/85 | HR 60 | Ht 61.0 in | Wt 111.0 lb

## 2021-10-07 DIAGNOSIS — G40109 Localization-related (focal) (partial) symptomatic epilepsy and epileptic syndromes with simple partial seizures, not intractable, without status epilepticus: Secondary | ICD-10-CM | POA: Diagnosis not present

## 2021-10-07 DIAGNOSIS — F418 Other specified anxiety disorders: Secondary | ICD-10-CM | POA: Diagnosis not present

## 2021-10-07 NOTE — Progress Notes (Signed)
[facility] [address] [place]. [place] [address] [phone]       OFFICE FOLLOW UP NOTE  Ms. HARNOOR SHILLINGTON Date of Birth:  09/30/1930 Medical Record Number:  [id number]   Referring MD:  Florencia Reasons  Reason for Referral:  seizure   Chief Complaint  Patient presents with   Follow-up    RM 3 alone Pt is well and stable. No sz since last visit      HPI  Update 10/07/2021 JM: Returns for 26-month seizure follow-up unaccompanied. Doing well on keppra 750mg  twice daily - denies side effects. No seizure activity. Tried to decrease dose but did not feel well (felt shaking and unwell) therefore increased dose back with resolution of symptoms. Lives alone.  Maintains maintains ADLs and IADLs independently. Reports frustration due to limited help from daughter. She feels isolated and depressed. Denies SI/HI. Reports her daughter was supposed to bring her today but due to work, unable to bring her - she drove herself and had difficulty finding office - her car broke down in near by parking lot and walked over to office. Is fearful she will end up in a nursing home as at times she will have difficulty maintaining all IADLs. No further concerns at this time.    History provided for reference purposes Update 03/27/2021 JM: Ms. Deantonio returns for yearly seizure follow-up.  Stable since prior visit without recurrent seizure activity.  Remains on Keppra without side effects.  She does question potentially lowering dosage as she has not had any seizure activity over the past 3 years.  She remains active maintaining all ADLs and IADLs independently.  No further concerns at this time.  Update 03/27/2020 JM: Ms. Thousand returns for seizure follow-up.  She has been doing well since prior visit without recurrent seizure activity or events.  Remains on Keppra 750 mg twice daily tolerating dosage without side effects.  Currently following with PCP for lower extremity pains and fatigue with Crestor  currently on hold for possible medication side effect.  She does have follow-up in the near future.  Follows with cardiology regularly.  Blood pressure stable at 130/70.  No concerns at this time.  Update 03/27/2019 Dr. Leonie Man: She returns for follow-up after last visit in December 2019 with Janett Billow, nurse practitioner.  She is doing well and has not had any recurrent episodes of sensory seizures or any other neurological issues.  She remains on Keppra 750 mg twice daily which she is tolerating well without dizziness or other side effects.  She woke up 1 day about a month ago with feeling of coldness and heaviness in both her feet but this does not last long and cleared in an hour or 2.  She did not seek medical help.  She saw her primary care physician few weeks ago who did some lab work all of which was satisfactory.  She states her blood pressure usually runs good but today it is slightly elevated in our office and 155/80 she feels this may be due to white coat hypertension.  She has no new complaints today.  Interval history 08/30/2018 [name] is being seen today for 1-month follow-up visit.  She did undergo repeat EEG which was normal without evidence of seizure activity.  She did increase keppra after prior appointment.  Approximately 3 to 4 weeks ago, patient did experience episode of left hand numbness which lasted approximately 15 seconds and then resolved.  She denies any additional episode occurring.  She does  endorse fatigue during the day and tiring quicker than before but she is unsure if this is due to medications versus old age.  She does experience some unsteadiness at times but was also experiencing this prior to her [facility] and being on keppra.  She did undergo a couple physical therapy sessions but financially was unable to continue.  She did receive handout regarding exercises to do at home to assist with balance but she does admit to not doing this.  She does state that she consistently stays  busy as she does live independently and manages all ADLs and IADLs.  She is able to continue to ambulate without assistive device and denies any recent falls.  She also states that intermittently she will have a burning sensation/sharp pinprick sensation on her left middle finger knuckle and left AC area which may only last for 1 to 2 minutes and then subside.  She believes that this pain could be due to arthritis and is not overly concerned as it happens infrequently.  Pressure today 141/80.  She continues on Crestor for HLD management.  No further concerns at this time.  Denies new or worsening stroke/TIA symptoms or seizure activity.   initial visit 05/26/2018 PS: Ms Clymer is a pleasant 29 year Caucasian lady seen today for initial consultation visit following hospital admission recently for paresthesias. History is provided by the patient and review of electronic medical records. I personally reviewed imaging films.EMPRISS VASUDEVAN is an 88 y.o. female past medical history of hypertension, hyperlipidemia, diabetes mellitus, atrial mass who presents to the emergency room on 04/22/18 after having multiple spells of numbness over the left hand extending to the arm as well as left facial numbness.She stated her first episode was about 1 week ago lasting about 15 minutes mainly if he affecting her left hand, arm as well as the left side of her face. Resolved spontaneously.  Continued to have 3 more episodes and therefore today she decided to come to the hospital.  She denies any weakness, however states she was not able to use her hand like she normally could.  Patient denies any jerking/twitching movements of the left arm or face.  Work-up in the ER included MRI brain which revealed a small area of subarachnoid hemorrhage on MRI brain in the right frontal lobe convexity only. This was difficult to visualize on the CT scan corresponding cuts..  Her CT scan was   negative h .  She denied any history of recent head  trauma, however she had a fall few weeks ago. She was on ASA at home. EEG done on 04/23/18 was normal. Transthoracic echo showed normal ejection fraction. Patient was started on Keppra 500 twice daily and had no further episodes after that was started. 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 Neurosurgery was consulted and recommended conservative follow-up for the same. Patient states she was doing well after discharge until 5 days ago when she had 2 transient episodes of numbness in her left hand gradually going up to her left elbow but this time not involving her face. This occurred twice on one day and then once the next day. She has not had these episodes for the last 3 days at least. She remains on Keppra 500 twice daily which is tolerating well without any side effects. She states she has not missed any doses.     ROS:  14 system review of systems is positive for those listed in HPI and all other systems negative  PMH:  Past Medical History:  Diagnosis Date   Allergy    Anxiety    Atrial mass    right   Constipation    Depression    Diabetes mellitus    Hyperlipidemia    Hypertension    Osteoporosis    Seizures (HCC)     Social History:  Social History   Socioeconomic History   Marital status: Single    Spouse name: Not on file   Number of children: 1   Years of education: Not on file   Highest education level: 12th grade  Occupational History   Occupation: retired  Tobacco Use   Smoking status: Former    Packs/day: 1.00    Years: 20.00    Pack years: 20.00    Types: Cigarettes    Quit date: 03/13/1998    Years since quitting: 23.5   Smokeless tobacco: Never  Vaping Use   Vaping Use: Never used  Substance and Sexual Activity   Alcohol use: No   Drug use: No   Sexual activity: Not Currently  Other Topics Concern   Not on file  Social History  Narrative   Regular exercise-yes      Daily caffeine use      Right handed       03/27/21 Lives at home alone    Social Determinants of Health   Financial Resource Strain: Low Risk    Difficulty of Paying Living Expenses: Not hard at all  Food Insecurity: No Food Insecurity   Worried About Programme researcher, broadcasting/film/video in the Last Year: Never true   Ran Out of Food in the Last Year: Never true  Transportation Needs: No Transportation Needs   Lack of Transportation (Medical): No   Lack of Transportation (Non-Medical): No  Physical Activity: Sufficiently Active   Days of Exercise per Week: 5 days   Minutes of Exercise per Session: 30 min  Stress: No Stress Concern Present   Feeling of Stress : Not at all  Social Connections: Moderately Integrated   Frequency of Communication with Friends and Family: More than three times a week   Frequency of Social Gatherings with Friends and Family: More than three times a week   Attends Religious Services: More than 4 times per year   Active Member of [employer] or Organizations: Yes   Attends Banker Meetings: More than 4 times per year   Marital Status: Widowed  Catering manager Violence: Not At Risk   Fear of Current or Ex-Partner: No   Emotionally Abused: No   Physically Abused: No   Sexually Abused: No    Medications:   Current Outpatient Medications on File Prior to Visit  Medication Sig Dispense Refill   acetaminophen (TYLENOL 8 HOUR) 650 MG CR tablet Take 1 tablet (650 mg total) by mouth every 8 (eight) hours as needed for pain. 100 tablet 3   amLODipine (NORVASC) 5 MG tablet TAKE ONE TABLET BY MOUTH DAILY 90 tablet 1   atenolol (TENORMIN) 100 MG tablet TAKE (1/2) TABLET TWICE DAILY. 90 tablet 3   Cholecalciferol (VITAMIN D3) 50 MCG (2000 UT) capsule Take 1 capsule (2,000 Units total) by mouth daily. 100 capsule 3   levETIRAcetam (KEPPRA) 750 MG tablet Take 1 tablet (750 mg total) by mouth 2 (two) times daily. 180 tablet 3    Multiple Vitamins-Minerals (CENTRUM SILVER) CHEW Chew 1 each  by mouth 3 (three) times a week.     PARoxetine (PAXIL) 10 MG tablet TAKE 1 TABLET IN THE MORNING. 90 tablet 1   rosuvastatin (CRESTOR) 20 MG tablet Take 10 mg on Mon-Wed-Fri 19 tablet 3   No current facility-administered medications on file prior to visit.    Allergies:   No Active Allergies    Vitals Today's Vitals   10/07/21 1332  BP: 140/85  Pulse: 60  Weight: 111 lb (50.3 kg)  Height: 5\' 1"  (1.549 m)   Body mass index is 20.97 kg/m.  Physical Exam General: frail petite very pleasant although anxious elderly Caucasian lady, seated, in no evident distress Head: head normocephalic and atraumatic.   Neck: supple with no carotid or supraclavicular bruits Cardiovascular: regular rate and rhythm, no murmurs Musculoskeletal: mild kyphosis Skin:  no rash/petichiae Vascular:  Normal pulses all extremities  Neurologic Exam Mental Status: Awake and fully alert.  Fluent speech and language.  Oriented to place and time. Recent and remote memory intact. Attention span, concentration and fund of knowledge appropriate. Mood and affect appropriate.  Cranial Nerves: Pupils equal, briskly reactive to light. Extraocular movements full without nystagmus. Visual fields full to confrontation. Hearing diminished bilaterally. Facial sensation intact. Face, tongue, palate moves normally and symmetrically.  Motor: Normal bulk and tone. Normal strength in all tested extremity muscles. Sensory.: intact to touch , pinprick , position and vibratory sensation.  Coordination: Rapid alternating movements normal in all extremities. Finger-to-nose and heel-to-shin performed accurately bilaterally. Gait and Station: Arises from chair without difficulty. Stance is normal. Gait demonstrates normal stride length and balance without use of assistive device.  Unable to heel, toe and tandem walk due to difficulty Reflexes: 1+ and symmetric. Toes downgoing.       ASSESSMENT: 88 year old lady with recurrent fleeting left arm and face paresthesias likely simple partial seizures likely symptomatic from small localized right frontal convexity subarachnoid hemorrhage which is non-aneurysmal and non-traumatic in etiology. Great concern regarding lack of family support (as stated) causing depression/anxiety and isolation feeling    PLAN:  -Continue Keppra 750 mg twice daily - attempted to lower at prior visit but felt "shaky and ill" therefore increased dose back with resolution of symptoms -discussed establishing care with [facility] for further assistance of depression/anxiety symptoms -discussed community support and to speak with insurance company to see what programs they may offer   Follow-up in 1 year or call earlier if needed    CC:  Plotnikov, Evie Lacks, MD     I spent 36 minutes of face-to-face and non-face-to-face time with patient.  This included previsit chart review, lab review, study review, order entry, electronic health record documentation, patient education regarding seizures currently managed on Keppra, prolonged discussion regarding mental health concerns and answered all other questions to patient satisfaction  Frann Rider, [facility]  [facility] [address] [place], [place] [address]  Phone [phone] Fax [phone] Note: This document was prepared with digital dictation and possible smart phrase technology. Any transcriptional errors that result from this process are unintentional.

## 2021-10-07 NOTE — Patient Instructions (Addendum)
Continue keppra 750mg  twice daily for seizure prevention     Follow up in 1 year or call earlier if needed    Thank you for coming to see at [facility]. I hope we have been able to provide you high quality care today.  You may receive a patient satisfaction survey over the next few weeks. We would appreciate your feedback and comments so that we may continue to improve ourselves and the health of our patients.

## 2021-10-08 ENCOUNTER — Telehealth: Payer: Self-pay | Admitting: *Deleted

## 2021-10-08 NOTE — Telephone Encounter (Signed)
Per NP I called patient and let her know Jesica referred her to a geriatric psychiatric specialist, Dr. Lolly Mustache - he will also likely know more community resources that can help her. I advised she  contact her health insurance as well. I informed her [name] was unable to find reliable information that would be of benefit to her online. Gave her Dr Sheela Stack #, advised she call his office in a week or 2 if she has not heard from him. Patient verbalized understanding, appreciation.

## 2021-10-08 NOTE — Addendum Note (Signed)
Addended by: Ihor Austin L on: 10/08/2021 01:39 PM   Modules accepted: Orders

## 2021-11-24 ENCOUNTER — Other Ambulatory Visit: Payer: Self-pay | Admitting: Internal Medicine

## 2021-12-15 ENCOUNTER — Ambulatory Visit: Payer: Medicare Other | Admitting: Internal Medicine

## 2021-12-29 ENCOUNTER — Ambulatory Visit (INDEPENDENT_AMBULATORY_CARE_PROVIDER_SITE_OTHER): Payer: Medicare Other | Admitting: Internal Medicine

## 2021-12-29 ENCOUNTER — Encounter: Payer: Self-pay | Admitting: Internal Medicine

## 2021-12-29 ENCOUNTER — Other Ambulatory Visit: Payer: Self-pay | Admitting: Internal Medicine

## 2021-12-29 VITALS — BP 140/78 | HR 83 | Temp 98.6°F | Ht 61.0 in | Wt 110.0 lb

## 2021-12-29 DIAGNOSIS — E785 Hyperlipidemia, unspecified: Secondary | ICD-10-CM

## 2021-12-29 DIAGNOSIS — Z0000 Encounter for general adult medical examination without abnormal findings: Secondary | ICD-10-CM | POA: Diagnosis not present

## 2021-12-29 DIAGNOSIS — M79606 Pain in leg, unspecified: Secondary | ICD-10-CM

## 2021-12-29 DIAGNOSIS — I10 Essential (primary) hypertension: Secondary | ICD-10-CM

## 2021-12-29 DIAGNOSIS — F411 Generalized anxiety disorder: Secondary | ICD-10-CM

## 2021-12-29 DIAGNOSIS — R569 Unspecified convulsions: Secondary | ICD-10-CM

## 2021-12-29 LAB — COMPREHENSIVE METABOLIC PANEL
ALT: 12 U/L (ref 0–35)
AST: 23 U/L (ref 0–37)
Albumin: 4.4 g/dL (ref 3.5–5.2)
Alkaline Phosphatase: 45 U/L (ref 39–117)
BUN: 16 mg/dL (ref 6–23)
CO2: 28 mEq/L (ref 19–32)
Calcium: 9.6 mg/dL (ref 8.4–10.5)
Chloride: 98 mEq/L (ref 96–112)
Creatinine, Ser: 1.08 mg/dL (ref 0.40–1.20)
GFR: 45.74 mL/min — ABNORMAL LOW (ref >60.00)
Glucose, Bld: 118 mg/dL — ABNORMAL HIGH (ref 70–99)
Potassium: 4.1 mEq/L (ref 3.5–5.1)
Sodium: 133 mEq/L — ABNORMAL LOW (ref 135–145)
Total Bilirubin: 0.5 mg/dL (ref 0.2–1.2)
Total Protein: 7.1 g/dL (ref 6.0–8.3)

## 2021-12-29 LAB — CBC WITH DIFFERENTIAL/PLATELET
Basophils Absolute: 0 10*3/uL (ref 0.0–0.1)
Basophils Relative: 0.5 % (ref 0.0–3.0)
Eosinophils Absolute: 0.2 10*3/uL (ref 0.0–0.7)
Eosinophils Relative: 2.9 % (ref 0.0–5.0)
HCT: 36.9 % (ref 36.0–46.0)
Hemoglobin: 12.5 g/dL (ref 12.0–15.0)
Lymphocytes Relative: 41.3 % (ref 12.0–46.0)
Lymphs Abs: 2.8 10*3/uL (ref 0.7–4.0)
MCHC: 33.8 g/dL (ref 30.0–36.0)
MCV: 85 fl (ref 78.0–100.0)
Monocytes Absolute: 0.7 10*3/uL (ref 0.1–1.0)
Monocytes Relative: 10.8 % (ref 3.0–12.0)
Neutro Abs: 3.1 10*3/uL (ref 1.4–7.7)
Neutrophils Relative %: 44.5 % (ref 43.0–77.0)
Platelets: 313 10*3/uL (ref 150.0–400.0)
RBC: 4.34 Mil/uL (ref 3.87–5.11)
RDW: 15.4 % (ref 11.5–15.5)
WBC: 6.9 10*3/uL (ref 4.0–10.5)

## 2021-12-29 LAB — LIPID PANEL
Cholesterol: 279 mg/dL — ABNORMAL HIGH (ref 0–200)
HDL: 56.5 mg/dL (ref >39.00)
LDL Cholesterol: 188 mg/dL — ABNORMAL HIGH (ref 0–99)
NonHDL: 222.83
Total CHOL/HDL Ratio: 5
Triglycerides: 174 mg/dL — ABNORMAL HIGH (ref 0.0–149.0)
VLDL: 34.8 mg/dL (ref 0.0–40.0)

## 2021-12-29 LAB — TSH: TSH: 2.74 u[IU]/mL (ref 0.35–5.50)

## 2021-12-29 MED ORDER — PAROXETINE HCL 10 MG PO TABS: 10.0000 mg | ORAL_TABLET | Freq: Every morning | ORAL | 3 refills | Status: DC

## 2021-12-29 MED ORDER — ROSUVASTATIN CALCIUM 20 MG PO TABS: ORAL_TABLET | ORAL | 3 refills | Status: DC

## 2021-12-29 MED ORDER — AMLODIPINE BESYLATE 5 MG PO TABS
5.0000 mg | ORAL_TABLET | Freq: Every day | ORAL | 3 refills | Status: DC
Start: 2021-12-29 — End: 2022-08-17

## 2021-12-29 MED ORDER — ATENOLOL 100 MG PO TABS: ORAL_TABLET | ORAL | 3 refills | Status: DC

## 2021-12-29 NOTE — Progress Notes (Signed)
? ?Subjective:  ?Patient ID: Vickie Wilson, female    DOB: [birth year]  Age: 88 y.o. MRN: [id number] ? ?CC: No chief complaint on file. ? ? ?HPI ?Vickie Wilson presents for HTN, dyslipidemia, depression ?C/o being lonely ? ?Outpatient Medications Prior to Visit  ?Medication Sig Dispense Refill  ? acetaminophen (TYLENOL 8 HOUR) 650 MG CR tablet Take 1 tablet (650 mg total) by mouth every 8 (eight) hours as needed for pain. 100 tablet 3  ? Cholecalciferol (VITAMIN D3) 50 MCG (2000 UT) capsule Take 1 capsule (2,000 Units total) by mouth daily. 100 capsule 3  ? levETIRAcetam (KEPPRA) 750 MG tablet Take 1 tablet (750 mg total) by mouth 2 (two) times daily. 180 tablet 3  ? Multiple Vitamins-Minerals (CENTRUM SILVER) CHEW Chew 1 each by mouth 3 (three) times a week.    ? amLODipine (NORVASC) 5 MG tablet TAKE ONE TABLET BY MOUTH DAILY 30 tablet 0  ? atenolol (TENORMIN) 100 MG tablet TAKE (1/2) TABLET TWICE DAILY. 90 tablet 3  ? PARoxetine (PAXIL) 10 MG tablet TAKE 1 TABLET IN THE MORNING. 30 tablet 0  ? rosuvastatin (CRESTOR) 20 MG tablet Take 10 mg on Mon-Wed-Fri 19 tablet 3  ? ?No facility-administered medications prior to visit.  ? ? ?ROS: ?Review of Systems  ?Constitutional:  Negative for activity change, appetite change, chills, fatigue and unexpected weight change.  ?HENT:  Negative for congestion, mouth sores and sinus pressure.   ?Eyes:  Negative for visual disturbance.  ?Respiratory:  Negative for cough and chest tightness.   ?Gastrointestinal:  Negative for abdominal pain and nausea.  ?Genitourinary:  Negative for difficulty urinating, frequency and vaginal pain.  ?Musculoskeletal:  Negative for back pain and gait problem.  ?Skin:  Negative for pallor and rash.  ?Neurological:  Negative for dizziness, tremors, weakness, numbness and headaches.  ?Psychiatric/Behavioral:  Negative for confusion, sleep disturbance and suicidal ideas. The patient is nervous/anxious.   ? ?Objective:  ?BP 140/78 (BP Location: Left Arm,  Patient Position: Sitting, Cuff Size: Large)   Pulse 83   Temp 98.6 ?F (37 ?C) (Oral)   Ht 5\' 1"  (1.549 m)   Wt 110 lb (49.9 kg)   SpO2 92%   BMI 20.78 kg/m?  ? ?BP Readings from Last 3 Encounters:  ?12/29/21 140/78  ?10/07/21 140/85  ?07/16/21 130/80  ? ? ?Wt Readings from Last 3 Encounters:  ?12/29/21 110 lb (49.9 kg)  ?10/07/21 111 lb (50.3 kg)  ?07/16/21 107 lb 6.4 oz (48.7 kg)  ? ? ?Physical Exam ?Constitutional:   ?   General: She is not in acute distress. ?   Appearance: Normal appearance. She is well-developed.  ?HENT:  ?   Head: Normocephalic.  ?   Right Ear: External ear normal.  ?   Left Ear: External ear normal.  ?   Nose: Nose normal.  ?Eyes:  ?   General:     ?   Right eye: No discharge.     ?   Left eye: No discharge.  ?   Conjunctiva/sclera: Conjunctivae normal.  ?   Pupils: Pupils are equal, round, and reactive to light.  ?Neck:  ?   Thyroid: No thyromegaly.  ?   Vascular: No JVD.  ?   Trachea: No tracheal deviation.  ?Cardiovascular:  ?   Rate and Rhythm: Normal rate and regular rhythm.  ?   Heart sounds: Normal heart sounds.  ?Pulmonary:  ?   Effort: No respiratory distress.  ?   Breath sounds:  No stridor. No wheezing.  ?Abdominal:  ?   General: Bowel sounds are normal. There is no distension.  ?   Palpations: Abdomen is soft. There is no mass.  ?   Tenderness: There is no abdominal tenderness. There is no guarding or rebound.  ?Musculoskeletal:     ?   General: No tenderness.  ?   Cervical back: Normal range of motion and neck supple. No rigidity.  ?Lymphadenopathy:  ?   Cervical: No cervical adenopathy.  ?Skin: ?   Findings: No erythema or rash.  ?Neurological:  ?   Mental Status: She is oriented to person, place, and time.  ?   Cranial Nerves: No cranial nerve deficit.  ?   Motor: No abnormal muscle tone.  ?   Coordination: Coordination abnormal.  ?   Deep Tendon Reflexes: Reflexes normal.  ?Psychiatric:     ?   Behavior: Behavior normal.     ?   Thought Content: Thought content normal.      ?   Judgment: Judgment normal.  ? ? ?Lab Results  ?Component Value Date  ? WBC 6.4 12/24/2020  ? HGB 12.8 12/24/2020  ? HCT 38.5 12/24/2020  ? PLT 315.0 12/24/2020  ? GLUCOSE 93 12/24/2020  ? CHOL 192 12/24/2020  ? TRIG 128.0 12/24/2020  ? HDL 53.80 12/24/2020  ? LDLDIRECT 125.6 01/16/2013  ? LDLCALC 113 (H) 12/24/2020  ? ALT 13 12/24/2020  ? AST 22 12/24/2020  ? NA 138 12/24/2020  ? K 3.9 12/24/2020  ? CL 102 12/24/2020  ? CREATININE 1.13 12/24/2020  ? BUN 16 12/24/2020  ? CO2 26 12/24/2020  ? TSH 3.39 12/24/2020  ? INR 0.92 04/22/2018  ? HGBA1C 6.1 08/22/2019  ? MICROALBUR 2.8 (H) 09/17/2016  ? ? ?CT ANGIO HEAD W OR WO CONTRAST ? ?Result Date: 04/23/2018 ?CLINICAL DATA:  Subarachnoid hemorrhage, proven, follow-up. EXAM: CT ANGIOGRAPHY HEAD AND NECK TECHNIQUE: Multidetector CT imaging of the head and neck was performed using the standard protocol during bolus administration of intravenous contrast. Multiplanar CT image reconstructions and MIPs were obtained to evaluate the vascular anatomy. Carotid stenosis measurements (when applicable) are obtained utilizing NASCET criteria, using the distal internal carotid diameter as the denominator. CONTRAST:  4mL ISOVUE-370 IOPAMIDOL (ISOVUE-370) INJECTION 76% COMPARISON:  The of conservatively or FINDINGS: CT HEAD FINDINGS Brain: Subarachnoid hemorrhage within the right middle frontal sulcus is stable. No new areas of hemorrhage are present. Atrophy and white matter disease is noted bilaterally. No mass lesion is present. No significant cortical infarct is present. Vascular: Atherosclerotic calcifications are present within the cavernous internal carotid arteries. There is no hyperdense vessel. Skull: Calvarium is intact. No focal lytic or blastic lesions are present. Asymmetric degenerative changes are present at the right TMJ. Sinuses: The paranasal sinuses and mastoid air cells are clear. Orbits: Left lens replacement is present. Globes and orbits are otherwise within  normal limits. Review of the MIP images confirms the above findings CTA NECK FINDINGS Aortic arch: A 3 vessel arch configuration is present. No significant stenosis or aneurysm is present. Right carotid system: The right common carotid artery is within normal limits. Dense atherosclerotic calcifications are present at the bifurcation without a significant stenosis relative to the more distal vessel. Left carotid system: The left common carotid artery is within normal limits. Atherosclerotic calcifications are present at the bifurcation without a significant luminal stenosis relative to the more distal vessel. Vertebral arteries: The vertebral arteries are codominant. Both vertebral arteries originate from the subclavian  arteries without a significant stenosis. There is some tortuosity of the V1 segments bilaterally. No significant stenosis or vessel injury is present to either vertebral artery in the neck. Skeleton: Degenerative anterolisthesis is present at C2-3, C3-4, and C4-5. There is chronic loss of disc height with endplate sclerosis at 075-GRM and C6-7. Marked lucency surrounds the roots of the residual right maxillary molar. Dental caries are present without periapical disease. Other neck: The soft tissues the neck are otherwise unremarkable. No significant adenopathy is present. No mucosal lesions are evident. Salivary glands are within normal limits bilaterally. Thyroid is normal. Upper chest: Centrilobular emphysematous changes are present bilaterally. No focal nodule or mass lesion is present. Review of the MIP images confirms the above findings CTA HEAD FINDINGS Anterior circulation: Atherosclerotic calcifications are present within the cavernous internal carotid arteries bilaterally. There is no hyperdense vessel. 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 No definite posterior communicating artery is present. The A1 and M1 segments are normal. The anterior  communicating artery is patent. MCA bifurcations are within normal limits bilaterally. The ACA and MCA branch vessels are normal. No focal vascular lesion is evident at the location of the subarachnoid hemorrhage ove

## 2021-12-29 NOTE — Assessment & Plan Note (Signed)
Cont on Norvasc, Atenolol 

## 2021-12-29 NOTE — Assessment & Plan Note (Signed)
Cont on Keppra  ?

## 2021-12-29 NOTE — Assessment & Plan Note (Signed)
OA/MSK pain B LEs ?Blue-Emu cream was recommended to use 2-3 times a day ?

## 2021-12-29 NOTE — Patient Instructions (Signed)
Blue-Emu cream -- use 2-3 times a day ? ?

## 2021-12-29 NOTE — Assessment & Plan Note (Signed)
Continue on Crestor 1-3/week  ?

## 2021-12-29 NOTE — Assessment & Plan Note (Signed)
Cont on Paxil. 

## 2021-12-31 LAB — URINALYSIS, ROUTINE W REFLEX MICROSCOPIC
Nitrite: NEGATIVE
Specific Gravity, Urine: 1.02 (ref 1.000–1.030)
Total Protein, Urine: 30 — AB
Urine Glucose: NEGATIVE
Urobilinogen, UA: 0.2 (ref 0.0–1.0)
pH: 6.5 (ref 5.0–8.0)

## 2021-12-31 NOTE — Addendum Note (Signed)
Addended by: Waldemar Dickens B on: 12/31/2021 04:08 PM ? ? Modules accepted: Orders ? ?

## 2022-01-01 ENCOUNTER — Other Ambulatory Visit: Payer: Self-pay | Admitting: Internal Medicine

## 2022-01-01 MED ORDER — AMOXICILLIN 500 MG PO CAPS: 500.0000 mg | ORAL_CAPSULE | Freq: Three times a day (TID) | ORAL | 0 refills | Status: DC

## 2022-02-03 ENCOUNTER — Telehealth: Payer: Self-pay | Admitting: Internal Medicine

## 2022-02-03 NOTE — Telephone Encounter (Signed)
Pt called in and wants to know what the medication was for that she was rx at last visit.  States she thinks it was an antibiotic- because there were just a few pills.   Requesting a call back from assistant. States OB wants to know what the medicine is for as well

## 2022-02-04 DIAGNOSIS — Z961 Presence of intraocular lens: Secondary | ICD-10-CM | POA: Diagnosis not present

## 2022-02-04 DIAGNOSIS — H26492 Other secondary cataract, left eye: Secondary | ICD-10-CM | POA: Diagnosis not present

## 2022-02-04 DIAGNOSIS — H353131 Nonexudative age-related macular degeneration, bilateral, early dry stage: Secondary | ICD-10-CM | POA: Diagnosis not present

## 2022-02-04 NOTE — Telephone Encounter (Signed)
Called pt back there was no answer LMOM that on 12/31/21 Dr. Alain Marion sent in Amoxicillin after he received urine results bck. On 12/29/21 when she actually saw him the only md that was sent to pharmacy was Paroxetine.Marland KitchenJohny Wilson

## 2022-02-17 ENCOUNTER — Ambulatory Visit (INDEPENDENT_AMBULATORY_CARE_PROVIDER_SITE_OTHER): Payer: Medicare Other | Admitting: Internal Medicine

## 2022-02-17 ENCOUNTER — Encounter: Payer: Self-pay | Admitting: Internal Medicine

## 2022-02-17 DIAGNOSIS — N390 Urinary tract infection, site not specified: Secondary | ICD-10-CM | POA: Diagnosis not present

## 2022-02-17 DIAGNOSIS — I129 Hypertensive chronic kidney disease with stage 1 through stage 4 chronic kidney disease, or unspecified chronic kidney disease: Secondary | ICD-10-CM | POA: Diagnosis not present

## 2022-02-17 DIAGNOSIS — M79606 Pain in leg, unspecified: Secondary | ICD-10-CM

## 2022-02-17 NOTE — Assessment & Plan Note (Addendum)
Recent  Vickie Wilson has a pessary - (it is out for now) It was treated w/Cipro Re-check UA prn (a standing order)

## 2022-02-17 NOTE — Assessment & Plan Note (Signed)
Cont on Atenolol, Amlodipine, Maxzide

## 2022-02-17 NOTE — Assessment & Plan Note (Signed)
OA/MSK pain B LEs - better Blue-Emu cream - use 2-3 times a day

## 2022-02-17 NOTE — Progress Notes (Signed)
Subjective:  Patient ID: Vickie Wilson, female    DOB: 11-07-1930  Age: 88 y.o. MRN: [id number]  CC: 6wk follow up   HPI Vickie Wilson presents for leg pain and a UTI 6 wks f/u F/u HTN, arthralgia  Outpatient Medications Prior to Visit  Medication Sig Dispense Refill   acetaminophen (TYLENOL 8 HOUR) 650 MG CR tablet Take 1 tablet (650 mg total) by mouth every 8 (eight) hours as needed for pain. 100 tablet 3   amLODipine (NORVASC) 5 MG tablet Take 1 tablet (5 mg total) by mouth daily. 90 tablet 3   amoxicillin (AMOXIL) 500 MG capsule Take 1 capsule (500 mg total) by mouth 3 (three) times daily. 15 capsule 0   atenolol (TENORMIN) 100 MG tablet TAKE (1/2) TABLET TWICE DAILY. 45 tablet 3   Cholecalciferol (VITAMIN D3) 50 MCG (2000 UT) capsule Take 1 capsule (2,000 Units total) by mouth daily. 100 capsule 3   levETIRAcetam (KEPPRA) 750 MG tablet Take 1 tablet (750 mg total) by mouth 2 (two) times daily. 180 tablet 3   Multiple Vitamins-Minerals (CENTRUM SILVER) CHEW Chew 1 each by mouth 3 (three) times a week.     PARoxetine (PAXIL) 10 MG tablet Take 1 tablet (10 mg total) by mouth every morning. 90 tablet 3   rosuvastatin (CRESTOR) 20 MG tablet Take 10 mg on Mon-Wed-Fri 30 tablet 3   No facility-administered medications prior to visit.    ROS: Review of Systems  Constitutional:  Positive for fatigue. Negative for activity change, appetite change, chills and unexpected weight change.  HENT:  Negative for congestion, mouth sores and sinus pressure.   Eyes:  Negative for visual disturbance.  Respiratory:  Negative for cough and chest tightness.   Gastrointestinal:  Negative for abdominal pain and nausea.  Genitourinary:  Negative for difficulty urinating, frequency and vaginal pain.  Musculoskeletal:  Negative for back pain and gait problem.  Skin:  Negative for pallor and rash.  Neurological:  Negative for dizziness, tremors, weakness, numbness and headaches.   Psychiatric/Behavioral:  Positive for dysphoric mood. Negative for confusion, sleep disturbance and suicidal ideas. The patient is nervous/anxious.    Objective:  BP 134/76 (BP Location: Right Arm, Patient Position: Sitting, Cuff Size: Normal)   Pulse 60   Temp (!) 97.5 F (36.4 C) (Oral)   Ht 5\' 1"  (1.549 m)   Wt 107 lb (48.5 kg)   SpO2 96%   BMI 20.22 kg/m   BP Readings from Last 3 Encounters:  02/17/22 134/76  12/29/21 140/78  10/07/21 140/85    Wt Readings from Last 3 Encounters:  02/17/22 107 lb (48.5 kg)  12/29/21 110 lb (49.9 kg)  10/07/21 111 lb (50.3 kg)    Physical Exam Constitutional:      General: She is not in acute distress.    Appearance: She is well-developed.  HENT:     Head: Normocephalic.     Right Ear: External ear normal.     Left Ear: External ear normal.     Nose: Nose normal.  Eyes:     General:        Right eye: No discharge.        Left eye: No discharge.     Conjunctiva/sclera: Conjunctivae normal.     Pupils: Pupils are equal, round, and reactive to light.  Neck:     Thyroid: No thyromegaly.     Vascular: No JVD.     Trachea: No tracheal deviation.  Cardiovascular:  Rate and Rhythm: Normal rate and regular rhythm.     Heart sounds: Normal heart sounds.  Pulmonary:     Effort: No respiratory distress.     Breath sounds: No stridor. No wheezing.  Abdominal:     General: Bowel sounds are normal. There is no distension.     Palpations: Abdomen is soft. There is no mass.     Tenderness: There is no abdominal tenderness. There is no guarding or rebound.  Musculoskeletal:        General: No tenderness.     Cervical back: Normal range of motion and neck supple. No rigidity.  Lymphadenopathy:     Cervical: No cervical adenopathy.  Skin:    Findings: No erythema or rash.  Neurological:     Mental Status: She is oriented to person, place, and time.     Cranial Nerves: No cranial nerve deficit.     Motor: No abnormal muscle tone.      Coordination: Coordination normal.     Deep Tendon Reflexes: Reflexes normal.  Psychiatric:        Behavior: Behavior normal.        Thought Content: Thought content normal.        Judgment: Judgment normal.    Lab Results  Component Value Date   WBC 6.9 12/29/2021   HGB 12.5 12/29/2021   HCT 36.9 12/29/2021   PLT 313.0 12/29/2021   GLUCOSE 118 (H) 12/29/2021   CHOL 279 (H) 12/29/2021   TRIG 174.0 (H) 12/29/2021   HDL 56.50 12/29/2021   LDLDIRECT 125.6 01/16/2013   LDLCALC 188 (H) 12/29/2021   ALT 12 12/29/2021   AST 23 12/29/2021   NA 133 (L) 12/29/2021   K 4.1 12/29/2021   CL 98 12/29/2021   CREATININE 1.08 12/29/2021   BUN 16 12/29/2021   CO2 28 12/29/2021   TSH 2.74 12/29/2021   INR 0.92 04/22/2018   HGBA1C 6.1 08/22/2019   MICROALBUR 2.8 (H) 09/17/2016    CT ANGIO HEAD W OR WO CONTRAST  Result Date: 04/23/2018 CLINICAL DATA:  Subarachnoid hemorrhage, proven, follow-up. EXAM: CT ANGIOGRAPHY HEAD AND NECK TECHNIQUE: Multidetector CT imaging of the head and neck was performed using the standard protocol during bolus administration of intravenous contrast. Multiplanar CT image reconstructions and MIPs were obtained to evaluate the vascular anatomy. Carotid stenosis measurements (when applicable) are obtained utilizing NASCET criteria, using the distal internal carotid diameter as the denominator. CONTRAST:  50mL ISOVUE-370 IOPAMIDOL (ISOVUE-370) INJECTION 76% COMPARISON:  The of conservatively or FINDINGS: CT HEAD FINDINGS Brain: Subarachnoid hemorrhage within the right middle frontal sulcus is stable. No new areas of hemorrhage are present. Atrophy and white matter disease is noted bilaterally. No mass lesion is present. No significant cortical infarct is present. Vascular: Atherosclerotic calcifications are present within the cavernous internal carotid arteries. There is no hyperdense vessel. Skull: Calvarium is intact. No focal lytic or blastic lesions are present.  Asymmetric degenerative changes are present at the right TMJ. Sinuses: The paranasal sinuses and mastoid air cells are clear. Orbits: Left lens replacement is present. Globes and orbits are otherwise within normal limits. Review of the MIP images confirms the above findings CTA NECK FINDINGS Aortic arch: A 3 vessel arch configuration is present. No significant stenosis or aneurysm is present. Right carotid system: The right common carotid artery is within normal limits. Dense atherosclerotic calcifications are present at the bifurcation without a significant stenosis relative to the more distal vessel. Left carotid system: The left common carotid  artery is within normal limits. Atherosclerotic calcifications are present at the bifurcation without a significant luminal stenosis relative to the more distal vessel. Vertebral arteries: The vertebral arteries are codominant. Both vertebral arteries originate from the subclavian arteries without a significant stenosis. There is some tortuosity of the V1 segments bilaterally. No significant stenosis or vessel injury is present to either vertebral artery in the neck. Skeleton: Degenerative anterolisthesis is present at C2-3, C3-4, and C4-5. There is chronic loss of disc height with endplate sclerosis at 075-GRM and C6-7. Marked lucency surrounds the roots of the residual right maxillary molar. Dental caries are present without periapical disease. Other neck: The soft tissues the neck are otherwise unremarkable. No significant adenopathy is present. No mucosal lesions are evident. Salivary glands are within normal limits bilaterally. Thyroid is normal. Upper chest: Centrilobular emphysematous changes are present bilaterally. No focal nodule or mass lesion is present. Review of the MIP images confirms the above findings CTA HEAD FINDINGS Anterior circulation: Atherosclerotic calcifications are present within the cavernous internal carotid arteries bilaterally. There is no  hyperdense vessel. 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 No definite posterior communicating artery is present. The A1 and M1 segments are normal. The anterior communicating artery is patent. MCA bifurcations are within normal limits bilaterally. The ACA and MCA branch vessels are normal. No focal vascular lesion is evident at the location of the subarachnoid hemorrhage over the right frontal convexity. Posterior circulation: Vertebral arteries are within normal limits bilaterally. PICA origins are visualized and normal. The basilar artery is normal. Both posterior cerebral arteries originate from the basilar tip. PCA branch vessels are within normal limits bilaterally. Venous sinuses: The dural sinuses are patent. Straight sinus and deep cerebral veins are intact. Cortical veins are normal. Anatomic variants: None Delayed phase: Delayed images demonstrate no pathologic enhancement. White matter disease is accentuated. Review of the MIP images confirms the above findings IMPRESSION: 1. No acute or focal vascular lesion to explain subarachnoid hemorrhage over the convexity of the right frontal lobe. 2. No mass lesion. 3. Stable atrophy and white matter disease. 4. 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 Recommend follow-up CTA of the head in 1 year to assess stability. This aneurysm does not correspond with the area of subarachnoid hemorrhage. Electronically Signed   By: San Morelle M.D.   On: 04/23/2018 19:28   DG Chest 2 View  Result Date: 04/22/2018 CLINICAL DATA:  Intermittent LEFT arm numbness for 2 weeks, history hypertension, diabetes mellitus, anxiety, former smoker EXAM: CHEST - 2 VIEW COMPARISON:  02/12/2018 FINDINGS: Normal heart size, mediastinal contours, and pulmonary vascularity. Atherosclerotic calcification aorta. Lungs hyperinflated with minimal subsegmental atelectasis at LEFT costophrenic angle.  Remaining lungs clear. No acute infiltrate, pleural effusion or pneumothorax. Bones appear demineralized with old healed fracture of the posterolateral RIGHT sixth rib. IMPRESSION: Hyperinflated lungs with minimal subsegmental atelectasis at LEFT costophrenic angle. Electronically Signed   By: Lavonia Dana M.D.   On: 04/22/2018 11:07   CT HEAD WO CONTRAST  Result Date: 04/22/2018 CLINICAL DATA:  Left arm numbness. EXAM: CT HEAD WITHOUT CONTRAST CT CERVICAL SPINE WITHOUT CONTRAST TECHNIQUE: Multidetector CT imaging of the head and cervical spine was performed following the standard protocol without intravenous contrast. Multiplanar CT image reconstructions of the cervical spine were also generated. COMPARISON:  None. FINDINGS: CT HEAD FINDINGS Brain: Mild diffuse cortical atrophy is noted. Mild chronic ischemic white matter disease is noted. No mass effect or  midline shift is noted. Ventricular size is within normal limits. There is no evidence of mass lesion, hemorrhage or acute infarction. Vascular: No hyperdense vessel or unexpected calcification. Skull: Normal. Negative for fracture or focal lesion. Sinuses/Orbits: No acute finding. Other: None. CT CERVICAL SPINE FINDINGS Alignment: Grade 1 anterolisthesis of C3-4 C4-5 is noted secondary to posterior facet joint hypertrophy. Skull base and vertebrae: No acute fracture. No primary bone lesion or focal pathologic process. Soft tissues and spinal canal: No prevertebral fluid or swelling. No visible canal hematoma. Disc levels: Severe degenerative disc disease is noted at C5-6 and C6-7 with anterior osteophyte formation. Mild degenerative disc disease is noted at C4-5 and C7-T1. Upper chest: Negative. Other: Degenerative changes seen involving posterior facet joints bilaterally, right greater than left. IMPRESSION: Mild diffuse cortical atrophy. Mild chronic ischemic white matter disease. No acute intracranial abnormality seen. Multilevel degenerative disc disease  is noted in the cervical spine. No fracture or other acute abnormality is noted. Electronically Signed   By: Marijo Conception, M.D.   On: 04/22/2018 11:09   CT ANGIO NECK W OR WO CONTRAST  Result Date: 04/23/2018 CLINICAL DATA:  Subarachnoid hemorrhage, proven, follow-up. EXAM: CT ANGIOGRAPHY HEAD AND NECK TECHNIQUE: Multidetector CT imaging of the head and neck was performed using the standard protocol during bolus administration of intravenous contrast. Multiplanar CT image reconstructions and MIPs were obtained to evaluate the vascular anatomy. Carotid stenosis measurements (when applicable) are obtained utilizing NASCET criteria, using the distal internal carotid diameter as the denominator. CONTRAST:  74mL ISOVUE-370 IOPAMIDOL (ISOVUE-370) INJECTION 76% COMPARISON:  The of conservatively or FINDINGS: CT HEAD FINDINGS Brain: Subarachnoid hemorrhage within the right middle frontal sulcus is stable. No new areas of hemorrhage are present. Atrophy and white matter disease is noted bilaterally. No mass lesion is present. No significant cortical infarct is present. Vascular: Atherosclerotic calcifications are present within the cavernous internal carotid arteries. There is no hyperdense vessel. Skull: Calvarium is intact. No focal lytic or blastic lesions are present. Asymmetric degenerative changes are present at the right TMJ. Sinuses: The paranasal sinuses and mastoid air cells are clear. Orbits: Left lens replacement is present. Globes and orbits are otherwise within normal limits. Review of the MIP images confirms the above findings CTA NECK FINDINGS Aortic arch: A 3 vessel arch configuration is present. No significant stenosis or aneurysm is present. Right carotid system: The right common carotid artery is within normal limits. Dense atherosclerotic calcifications are present at the bifurcation without a significant stenosis relative to the more distal vessel. Left carotid system: The left common carotid  artery is within normal limits. Atherosclerotic calcifications are present at the bifurcation without a significant luminal stenosis relative to the more distal vessel. Vertebral arteries: The vertebral arteries are codominant. Both vertebral arteries originate from the subclavian arteries without a significant stenosis. There is some tortuosity of the V1 segments bilaterally. No significant stenosis or vessel injury is present to either vertebral artery in the neck. Skeleton: Degenerative anterolisthesis is present at C2-3, C3-4, and C4-5. There is chronic loss of disc height with endplate sclerosis at 075-GRM and C6-7. Marked lucency surrounds the roots of the residual right maxillary molar. Dental caries are present without periapical disease. Other neck: The soft tissues the neck are otherwise unremarkable. No significant adenopathy is present. No mucosal lesions are evident. Salivary glands are within normal limits bilaterally. Thyroid is normal. Upper chest: Centrilobular emphysematous changes are present bilaterally. No focal nodule or mass lesion is present. Review of the  MIP images confirms the above findings CTA HEAD FINDINGS Anterior circulation: Atherosclerotic calcifications are present within the cavernous internal carotid arteries bilaterally. There is no hyperdense vessel. 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 No definite posterior communicating artery is present. The A1 and M1 segments are normal. The anterior communicating artery is patent. MCA bifurcations are within normal limits bilaterally. The ACA and MCA branch vessels are normal. No focal vascular lesion is evident at the location of the subarachnoid hemorrhage over the right frontal convexity. Posterior circulation: Vertebral arteries are within normal limits bilaterally. PICA origins are visualized and normal. The basilar artery is normal. Both posterior cerebral arteries originate from the basilar  tip. PCA branch vessels are within normal limits bilaterally. Venous sinuses: The dural sinuses are patent. Straight sinus and deep cerebral veins are intact. Cortical veins are normal. Anatomic variants: None Delayed phase: Delayed images demonstrate no pathologic enhancement. White matter disease is accentuated. Review of the MIP images confirms the above findings IMPRESSION: 1. No acute or focal vascular lesion to explain subarachnoid hemorrhage over the convexity of the right frontal lobe. 2. No mass lesion. 3. Stable atrophy and white matter disease. 4. 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 Recommend follow-up CTA of the head in 1 year to assess stability. This aneurysm does not correspond with the area of subarachnoid hemorrhage. Electronically Signed   By: San Morelle M.D.   On: 04/23/2018 19:28   CT Cervical Spine Wo Contrast  Result Date: 04/22/2018 CLINICAL DATA:  Left arm numbness. EXAM: CT HEAD WITHOUT CONTRAST CT CERVICAL SPINE WITHOUT CONTRAST TECHNIQUE: Multidetector CT imaging of the head and cervical spine was performed following the standard protocol without intravenous contrast. Multiplanar CT image reconstructions of the cervical spine were also generated. COMPARISON:  None. FINDINGS: CT HEAD FINDINGS Brain: Mild diffuse cortical atrophy is noted. Mild chronic ischemic white matter disease is noted. No mass effect or midline shift is noted. Ventricular size is within normal limits. There is no evidence of mass lesion, hemorrhage or acute infarction. Vascular: No hyperdense vessel or unexpected calcification. Skull: Normal. Negative for fracture or focal lesion. Sinuses/Orbits: No acute finding. Other: None. CT CERVICAL SPINE FINDINGS Alignment: Grade 1 anterolisthesis of C3-4 C4-5 is noted secondary to posterior facet joint hypertrophy. Skull base and vertebrae: No acute fracture. No primary bone lesion or focal pathologic process. Soft  tissues and spinal canal: No prevertebral fluid or swelling. No visible canal hematoma. Disc levels: Severe degenerative disc disease is noted at C5-6 and C6-7 with anterior osteophyte formation. Mild degenerative disc disease is noted at C4-5 and C7-T1. Upper chest: Negative. Other: Degenerative changes seen involving posterior facet joints bilaterally, right greater than left. IMPRESSION: Mild diffuse cortical atrophy. Mild chronic ischemic white matter disease. No acute intracranial abnormality seen. Multilevel degenerative disc disease is noted in the cervical spine. No fracture or other acute abnormality is noted. Electronically Signed   By: Marijo Conception, M.D.   On: 04/22/2018 11:09   MR Brain Wo Contrast (neuro protocol)  Result Date: 04/22/2018 CLINICAL DATA:  Numbness or tingling, paresthesia. Intermittent left arm numbness for 2 weeks EXAM: MRI HEAD WITHOUT CONTRAST TECHNIQUE: Multiplanar, multiecho pulse sequences of the brain and surrounding structures were obtained without intravenous contrast. COMPARISON:  CT from earlier today FINDINGS: Brain: Small focus of subarachnoid FLAIR hyperintensity and gradient hypointensity along the precentral gyrus on the right, this area is high-density by CT. No other site  of hemorrhage seen, either acute or chronic. There is mild for age chronic small vessel ischemia in the cerebral white matter. Mild cerebral volume loss. Clustered appearance of gyri at the left vertex, nonspecific and likely noncontributory. No underlying mass is seen and there is no extrinsic mass effect by collection. No hydrocephalus. Vascular: Major flow voids are preserved Skull and upper cervical spine: No evidence of marrow lesion. Diffuse cervical facet spurring with mild C3-4 and C4-5 anterolisthesis. Sinuses/Orbits: Left cataract resection. Other: These results were called by telephone at the time of interpretation on 04/22/2018 at 2:10 pm to Dr. Francine Graven , who verbally  acknowledged these results. IMPRESSION: 1. Small volume subarachnoid hemorrhage along the high right frontal convexity. Trauma, coagulopathy, or vasculopathy/amyloid are primary considerations in this location. There is normal superior sagittal sinus flow voids. No acute or remote hemorrhage seen elsewhere. 2. Mild atrophy and chronic small vessel ischemia for age. Electronically Signed   By: Monte Fantasia M.D.   On: 04/22/2018 14:14   EEG adult  Result Date: 04/23/2018 Greta Doom, MD     04/23/2018  1:34 PM History: 88 yo F with intermittent numbness Sedation: None Technique: This is a 21 channel routine scalp EEG performed at the bedside with bipolar and monopolar montages arranged in accordance to the international 10/20 system of electrode placement. One channel was dedicated to EKG recording. Background: The background consists of intermixed alpha and beta activities. There is a well defined posterior dominant rhythm of 9-10 Hz that attenuates with eye opening. Sleep is not recorded. Photic stimulation: Physiologic driving is not performed EEG Abnormalities: None Clinical Interpretation: This normal EEG is recorded in the waking state. There was no seizure or seizure predisposition recorded on this study. Please note that a normal EEG does not preclude the possibility of epilepsy. Roland Rack, MD [facility] [phone] If 7pm- 7am, please page neurology on call as listed in [place].   ECHOCARDIOGRAM COMPLETE  Result Date: 04/23/2018                            *[facility]*                         [address], [place] [address]                            [phone] ------------------------------------------------------------------- Transthoracic Echocardiography Patient:    Candina, Sotto MR #:       [id number] Study Date: 04/23/2018 Gender:     F Age:        40 Height:     154.9 cm Weight:      50.6 kg BSA:        1.48 m^2 Pt. Status: Room:       3W19C  ATTENDING    Francine Graven [id number]  PERFORMING   [facility], Inpatient  ADMITTING    Emokpae, Courage  ORDERING     Emokpae, Courage  REFERRING    Emokpae, Courage  SONOGRAPHER  Madelaine Etienne cc: ------------------------------------------------------------------- LV EF: 55% -   60% ------------------------------------------------------------------- Indications:  TIA 435.9. ------------------------------------------------------------------- Study Conclusions - Left ventricle: The cavity size was normal. Wall thickness was   normal. Systolic function was normal. The estimated ejection   fraction was in the range of 55% to 60%. Wall motion was normal;   there were no regional wall motion abnormalities. Doppler   parameters are consistent with abnormal left ventricular   relaxation (grade 1 diastolic dysfunction). - Aortic valve: Valve area (VTI): 2.42 cm^2. Valve area (Vmax):   2.38 cm^2. Valve area (Vmean): 2.23 cm^2. ------------------------------------------------------------------- Study data:  Comparison was made to the study of 03/02/2018.  Study status:  Routine.  Procedure:  Transthoracic echocardiography. Image quality was good.  Study completion:  There were no complications.          Transthoracic echocardiography.  2D, spectral Doppler, and color Doppler.  Birthdate:  Patient birthdate: [birth year].  Age:  Patient is 88 yr old.  Sex:  Gender: female.    BMI: 21.1 kg/m^2.  Blood pressure:     131/72  Patient status:  Inpatient.  Study date:  Study date: 04/23/2018. Study time: 02:16 PM.  Location:  Bedside. ------------------------------------------------------------------- ------------------------------------------------------------------- Left ventricle:  The cavity size was normal. Wall thickness was normal. Systolic function was normal. The estimated ejection fraction was in the range of 55% to 60%. Wall motion was normal; there were no  regional wall motion abnormalities. Doppler parameters are consistent with abnormal left ventricular relaxation (grade 1 diastolic dysfunction). ------------------------------------------------------------------- Aortic valve:   Mildly calcified leaflets. Cusp separation was normal.  Doppler:  Transvalvular velocity was within the normal range. There was no stenosis. There was no regurgitation.    VTI ratio of LVOT to aortic valve: 0.77. Valve area (VTI): 2.42 cm^2. Indexed valve area (VTI): 1.64 cm^2/m^2. Peak velocity ratio of LVOT to aortic valve: 0.76. Valve area (Vmax): 2.38 cm^2. Indexed valve area (Vmax): 1.61 cm^2/m^2. Mean velocity ratio of LVOT to aortic valve: 0.71. Valve area (Vmean): 2.23 cm^2. Indexed valve area (Vmean): 1.51 cm^2/m^2.    Mean gradient (S): 3 mm Hg. Peak gradient (S): 5 mm Hg. ------------------------------------------------------------------- Aorta:  Aortic root: The aortic root was normal in size. Ascending aorta: The ascending aorta was normal in size. ------------------------------------------------------------------- Mitral valve:   Structurally normal valve.   Leaflet separation was normal.  Doppler:  Transvalvular velocity was within the normal range. There was no evidence for stenosis. There was no regurgitation. ------------------------------------------------------------------- Left atrium:  The atrium was normal in size. ------------------------------------------------------------------- Right ventricle:  The cavity size was normal. Wall thickness was normal. Systolic function was normal. ------------------------------------------------------------------- Pulmonic valve:   Poorly visualized.  Doppler:  There was no significant regurgitation. ------------------------------------------------------------------- Tricuspid valve:   Structurally normal valve.   Leaflet separation was normal.  Doppler:  Transvalvular velocity was within the normal range. There was mild  regurgitation. ------------------------------------------------------------------- Right atrium:  The atrium was normal in size. ------------------------------------------------------------------- Pericardium:  There was no pericardial effusion. ------------------------------------------------------------------- Systemic veins: Inferior vena cava: The vessel was normal in size. The respirophasic diameter changes were in the normal range (>= 50%), consistent with normal central venous pressure. ------------------------------------------------------------------- Measurements  Left ventricle                           Value          Reference  LV ID, ED, PLAX chordal          (L)     42.7  mm       43 -  52  LV ID, ES, PLAX chordal                  29.4  mm       23 - 38  LV fx shortening, PLAX chordal           31    %        >=29  LV PW thickness, ED                      4.55  mm       ----------  IVS/LV PW ratio, ED              (H)     1.58           <=1.3  Stroke volume, 2D                        65    ml       ----------  Stroke volume/bsa, 2D                    44    ml/m^2   ----------  LV e&', lateral                           5.87  cm/s     ----------  LV E/e&', lateral                         10.75          ----------  LV s&', lateral                           6.42  cm/s     ----------  LV e&', medial                            4.24  cm/s     ----------  LV E/e&', medial                          14.88          ----------  LV e&', average                           5.06  cm/s     ----------  LV E/e&', average                         12.48          ----------   Ventricular septum                       Value          Reference  IVS thickness, ED                        7.2   mm       ----------   LVOT                                     Value  Reference  LVOT ID, S                               20    mm       ----------  LVOT area                                3.14  cm^2     ----------  LVOT peak velocity,  S                    87.9  cm/s     ----------  LVOT mean velocity, S                    58.4  cm/s     ----------  LVOT VTI, S                              20.7  cm       ----------   Aortic valve                             Value          Reference  Aortic valve peak velocity, S            116   cm/s     ----------  Aortic valve mean velocity, S            82.4  cm/s     ----------  Aortic valve VTI, S                      26.9  cm       ----------  Aortic mean gradient, S                  3     mm Hg    ----------  Aortic peak gradient, S                  5     mm Hg    ----------  VTI ratio, LVOT/AV                       0.77           ----------  Aortic valve area, VTI                   2.42  cm^2     ----------  Aortic valve area/bsa, VTI               1.64  cm^2/m^2 ----------  Velocity ratio, peak, LVOT/AV            0.76           ----------  Aortic valve area, peak velocity         2.38  cm^2     ----------  Aortic valve area/bsa, peak              1.61  cm^2/m^2 ----------  velocity  Velocity ratio, mean, LVOT/AV            0.71           ----------  Aortic valve area, mean velocity  2.23  cm^2     ----------  Aortic valve area/bsa, mean              1.51  cm^2/m^2 ----------  velocity   Aorta                                    Value          Reference  Aortic root ID, ED                       27    mm       ----------   Left atrium                              Value          Reference  LA ID, A-P, ES                           25    mm       ----------  LA ID/bsa, A-P                           1.69  cm/m^2   <=2.2  LA volume, S                             38.7  ml       ----------  LA volume/bsa, S                         26.2  ml/m^2   ----------  LA volume, ES, 1-p A4C                   31.7  ml       ----------  LA volume/bsa, ES, 1-p A4C               21.5  ml/m^2   ----------  LA volume, ES, 1-p A2C                   45.7  ml       ----------  LA volume/bsa, ES, 1-p A2C               30.9   ml/m^2   ----------   Mitral valve                             Value          Reference  Mitral E-wave peak velocity              63.1  cm/s     ----------  Mitral A-wave peak velocity              95.1  cm/s     ----------  Mitral deceleration time         (H)     363   ms       150 - 230  Mitral E/A ratio, peak                   0.7            ----------  Pulmonary veins                          Value          Reference  Pulmonary vein peak velocity, S          50    cm/s     ----------  Pulmonary vein peak velocity, D          28    cm/s     ----------  Pulmonary vein velocity ratio,           1.8            ----------  peak, S/D   Pulmonary arteries                       Value          Reference  PA pressure, S, DP                       27    mm Hg    <=30   Tricuspid valve                          Value          Reference  Tricuspid regurg peak velocity           247   cm/s     ----------  Tricuspid peak RV-RA gradient            24    mm Hg    ----------   Right atrium                             Value          Reference  RA ID, S-I, ES, A4C              (H)     49.8  mm       34 - 49  RA area, ES, A4C                         13.2  cm^2     8.3 - 19.5  RA volume, ES, A/L                       29    ml       ----------  RA volume/bsa, ES, A/L                   19.6  ml/m^2   ----------   Systemic veins                           Value          Reference  Estimated CVP                            3     mm Hg    ----------   Right ventricle                          Value          Reference  RV ID, ED, PLAX  33.1  mm       19 - 38  TAPSE                                    19.2  mm       ----------  RV pressure, S, DP                       27    mm Hg    <=30  RV s&', lateral, S                        10.4  cm/s     ----------   Pulmonic valve                           Value          Reference  Pulmonic valve peak velocity, S          84.2  cm/s     ---------- Legend: (L)  and  (H)  mark  values outside specified reference range. ------------------------------------------------------------------- Prepared and Electronically Authenticated by Pierre Bali, MD 2019-08-10T15:35:38   Assessment & Plan:   Problem List Items Addressed This Visit     Hypertensive renal disease    Cont on Atenolol, Amlodipine, Maxzide      Leg pain    OA/MSK pain B LEs - better Blue-Emu cream - use 2-3 times a day      Urinary tract infection without hematuria    Recent  Vickie Wilson has a pessary - (it is out for now) It was treated w/Cipro Re-check UA prn (a standing order)       Relevant Orders   Urinalysis      No orders of the defined types were placed in this encounter.     Follow-up: No follow-ups on file.  Walker Kehr, MD

## 2022-02-18 DIAGNOSIS — Z87440 Personal history of urinary (tract) infections: Secondary | ICD-10-CM | POA: Diagnosis not present

## 2022-02-25 DIAGNOSIS — N8110 Cystocele, unspecified: Secondary | ICD-10-CM | POA: Diagnosis not present

## 2022-03-04 DIAGNOSIS — H26492 Other secondary cataract, left eye: Secondary | ICD-10-CM | POA: Diagnosis not present

## 2022-03-23 ENCOUNTER — Other Ambulatory Visit: Payer: Self-pay | Admitting: Internal Medicine

## 2022-04-14 ENCOUNTER — Other Ambulatory Visit: Payer: Self-pay | Admitting: Adult Health

## 2022-04-27 ENCOUNTER — Telehealth: Payer: Self-pay

## 2022-04-27 NOTE — Telephone Encounter (Signed)
Pt is regarding a call back in regards to being depressed. Pt states that she is unable to come in for an appt at this time and does not have Mychart. Pt says that she knows nothing about Mychart and wasn't interested in that. Pt says she really needs to speak to Dr. Posey Rea but if he cant call her then please have his nurse to call.  Please advise Pt CB [phone]

## 2022-04-27 NOTE — Telephone Encounter (Signed)
Pls sch telephone OV w/me Thx

## 2022-04-28 NOTE — Telephone Encounter (Signed)
Pt scheduled a telephone ov for 05/07/22 and she would like to know if she can be worked in for a sooner appointment.    Please advise.

## 2022-04-29 NOTE — Telephone Encounter (Signed)
Okay to schedule a telephone call visit on Friday 8/18 7:50 AM.  Thanks

## 2022-04-29 NOTE — Telephone Encounter (Signed)
Notified pt w/MD response. Change appt til this Friday.Vickie KitchenRaechel Chute

## 2022-05-01 ENCOUNTER — Ambulatory Visit (INDEPENDENT_AMBULATORY_CARE_PROVIDER_SITE_OTHER): Payer: Medicare Other | Admitting: Internal Medicine

## 2022-05-01 ENCOUNTER — Encounter: Payer: Self-pay | Admitting: Internal Medicine

## 2022-05-01 DIAGNOSIS — R569 Unspecified convulsions: Secondary | ICD-10-CM | POA: Diagnosis not present

## 2022-05-01 DIAGNOSIS — F413 Other mixed anxiety disorders: Secondary | ICD-10-CM

## 2022-05-01 DIAGNOSIS — F3342 Major depressive disorder, recurrent, in full remission: Secondary | ICD-10-CM

## 2022-05-01 MED ORDER — PAROXETINE HCL 20 MG PO TABS: 20.0000 mg | ORAL_TABLET | Freq: Every day | ORAL | 1 refills | Status: DC

## 2022-05-01 MED ORDER — LORAZEPAM 0.5 MG PO TABS: 0.5000 mg | ORAL_TABLET | Freq: Two times a day (BID) | ORAL | 1 refills | Status: DC | PRN

## 2022-05-01 NOTE — Assessment & Plan Note (Signed)
Worse.  Vickie Wilson is complaining of being depressed, anxious and lonely.  She is complaining of lack of involvement help from her family.  However, she remains independent.  She is not suicidal or homicidal. We will increase paroxetine to 20 mg daily.  We will use lorazepam for anxiety as needed infrequently.  Potential benefits of a long term benzodiazepines  use as well as potential risks  and complications were explained to the patient and were aknowledged. She declined home care RN visit.

## 2022-05-01 NOTE — Assessment & Plan Note (Signed)
Worse.  Vickie Wilson is complaining of being depressed, anxious and lonely.  She is complaining of lack of involvement help from her family.  However, she remains independent.  She is not suicidal or homicidal. We will increase paroxetine to 20 mg daily.  We will use lorazepam for anxiety as needed infrequently.  Potential benefits of a long term benzodiazepines  use as well as potential risks  and complications were explained to the patient and were aknowledged. She declined home care RN visit. 

## 2022-05-01 NOTE — Assessment & Plan Note (Signed)
No recurrence.  We will continue on Keppra

## 2022-05-01 NOTE — Progress Notes (Addendum)
Virtual Visit via Telephone Note  I connected with Vickie Wilson at 8:18 AM EST by telephone and verified that I am speaking with the correct person using two identifiers.  Location:  Persons participating in the virtual visit: patient, provider Patient: home Provider: [facility]  The patient is unable to leave home and come to the office.  She does not have Internet.   I discussed the limitations, risks, security and privacy concerns of performing an evaluation and management service by telephone and the availability of in person appointments. I also discussed with the patient that there may be a patient responsible charge related to this service. The patient expressed understanding and agreed to proceed.  No chief complaint on file.    History of Present Illness:    Review of Systems  Constitutional:  Negative for malaise/fatigue.  Respiratory:  Negative for cough.   Cardiovascular:  Negative for palpitations.  Neurological:  Negative for dizziness, tremors, seizures, loss of consciousness, weakness and headaches.  Psychiatric/Behavioral:  Positive for depression. Negative for hallucinations, memory loss, substance abuse and suicidal ideas. The patient is nervous/anxious. The patient does not have insomnia.      Observations/Objective:  Vickie Wilson sounds normal phone.  She is complaining of being depressed, anxious and lonely.  She is complaining of lack of involvement help from her family.  However, she remains independent.  She is not suicidal or homicidal. She declined home care RN visit.  Assessment and Plan:  Problem List Items Addressed This Visit     Anxiety disorder    Worse.  Vickie Wilson is complaining of being depressed, anxious and lonely.  She is complaining of lack of involvement help from her family.  However, she remains independent.  She is not suicidal or homicidal. We will increase paroxetine to 20 mg daily.  We will use lorazepam for anxiety as needed  infrequently.  Potential benefits of a long term benzodiazepines  use as well as potential risks  and complications were explained to the patient and were aknowledged. She declined home care RN visit.       Relevant Medications   PARoxetine (PAXIL) 20 MG tablet   LORazepam (ATIVAN) 0.5 MG tablet   Depression    Worse.  Vickie Wilson is complaining of being depressed, anxious and lonely.  She is complaining of lack of involvement help from her family.  However, she remains independent.  She is not suicidal or homicidal. We will increase paroxetine to 20 mg daily.  We will use lorazepam for anxiety as needed infrequently.  Potential benefits of a long term benzodiazepines  use as well as potential risks  and complications were explained to the patient and were aknowledged. She declined home care RN visit.      Relevant Medications   PARoxetine (PAXIL) 20 MG tablet   LORazepam (ATIVAN) 0.5 MG tablet   Partial seizure (HCC)    No recurrence.  We will continue on Keppra        Meds ordered this encounter  Medications   PARoxetine (PAXIL) 20 MG tablet    Sig: Take 1 tablet (20 mg total) by mouth daily.    Dispense:  90 tablet    Refill:  1   LORazepam (ATIVAN) 0.5 MG tablet    Sig: Take 1 tablet (0.5 mg total) by mouth 2 (two) times daily as needed for anxiety.    Dispense:  60 tablet    Refill:  1      Follow Up Instructions:  I discussed the assessment and treatment plan with the patient. The patient was provided an opportunity to ask questions and all were answered. The patient agreed with the plan and demonstrated an understanding of the instructions.   The patient was advised to call back or seek an in-person evaluation if the symptoms worsen or if the condition fails to improve as anticipated.  I provided 21 minutes of non-face-to-face time during this encounter.   Sonda Primes, MD

## 2022-05-07 ENCOUNTER — Ambulatory Visit: Payer: Medicare Other | Admitting: Internal Medicine

## 2022-06-09 ENCOUNTER — Telehealth: Payer: Self-pay

## 2022-06-09 NOTE — Telephone Encounter (Signed)
Patient is calling in stating she has been sick for a while, and wants an appointment with Dr.Plotnikov. 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 Advised all messages can take 24-48 hours, she verbalized understanding.

## 2022-06-10 ENCOUNTER — Ambulatory Visit: Payer: Medicare Other | Admitting: Internal Medicine

## 2022-06-10 NOTE — Telephone Encounter (Signed)
Called pt no answer LMOM w/MD response pls call back to confirm.Marland KitchenJohny Wilson

## 2022-06-10 NOTE — Telephone Encounter (Signed)
Add pt on at 11:40...Vickie Wilson

## 2022-06-10 NOTE — Telephone Encounter (Signed)
Patient said that 11:40 is fine - You will need to get [facility] to open a spot.

## 2022-06-10 NOTE — Telephone Encounter (Signed)
We can try 1140 tomorrow.  Thanks

## 2022-06-11 ENCOUNTER — Ambulatory Visit (INDEPENDENT_AMBULATORY_CARE_PROVIDER_SITE_OTHER): Payer: Medicare Other

## 2022-06-11 ENCOUNTER — Encounter: Payer: Self-pay | Admitting: Internal Medicine

## 2022-06-11 ENCOUNTER — Ambulatory Visit (INDEPENDENT_AMBULATORY_CARE_PROVIDER_SITE_OTHER): Payer: Medicare Other | Admitting: Internal Medicine

## 2022-06-11 VITALS — BP 130/82 | HR 56 | Temp 97.9°F | Ht 61.0 in | Wt 105.6 lb

## 2022-06-11 DIAGNOSIS — R059 Cough, unspecified: Secondary | ICD-10-CM | POA: Diagnosis not present

## 2022-06-11 DIAGNOSIS — R051 Acute cough: Secondary | ICD-10-CM | POA: Diagnosis not present

## 2022-06-11 DIAGNOSIS — J180 Bronchopneumonia, unspecified organism: Secondary | ICD-10-CM | POA: Diagnosis not present

## 2022-06-11 DIAGNOSIS — R634 Abnormal weight loss: Secondary | ICD-10-CM | POA: Insufficient documentation

## 2022-06-11 MED ORDER — CEFDINIR 300 MG PO CAPS: 300.0000 mg | ORAL_CAPSULE | Freq: Two times a day (BID) | ORAL | 0 refills | Status: DC

## 2022-06-11 MED ORDER — PROMETHAZINE-DM 6.25-15 MG/5ML PO SYRP: 5.0000 mL | ORAL_SOLUTION | Freq: Four times a day (QID) | ORAL | 0 refills | Status: DC | PRN

## 2022-06-11 NOTE — Progress Notes (Signed)
Subjective:  Patient ID: Vickie Wilson, female    DOB: Aug 26, 1931  Age: 88 y.o. MRN: [id number]  CC: Cough (Pt states her cough is terrible.. feel a lil better but constantly cough) and Nasal Congestion   HPI Vickie Wilson presents for URI followed by severe dry cough x2 weeks.  Complains of poor appetite over the past couple weeks  Outpatient Medications Prior to Visit  Medication Sig Dispense Refill   acetaminophen (TYLENOL 8 HOUR) 650 MG CR tablet Take 1 tablet (650 mg total) by mouth every 8 (eight) hours as needed for pain. 100 tablet 3   amLODipine (NORVASC) 5 MG tablet Take 1 tablet (5 mg total) by mouth daily. 90 tablet 3   atenolol (TENORMIN) 100 MG tablet TAKE (1/2) TABLET TWICE DAILY. 45 tablet 3   Cholecalciferol (VITAMIN D3) 50 MCG (2000 UT) capsule Take 1 capsule (2,000 Units total) by mouth daily. 100 capsule 3   levETIRAcetam (KEPPRA) 750 MG tablet TAKE ONE TABLET BY MOUTH TWICE DAILY 180 tablet 3   LORazepam (ATIVAN) 0.5 MG tablet Take 1 tablet (0.5 mg total) by mouth 2 (two) times daily as needed for anxiety. 60 tablet 1   Multiple Vitamins-Minerals (CENTRUM SILVER) CHEW Chew 1 each by mouth 3 (three) times a week.     PARoxetine (PAXIL) 20 MG tablet Take 1 tablet (20 mg total) by mouth daily. 90 tablet 1   rosuvastatin (CRESTOR) 20 MG tablet take 1/2 tablet (10mg ) by mouth Monday, Wednesday and Friday of each week.} 19 tablet 1   amoxicillin (AMOXIL) 500 MG capsule Take 1 capsule (500 mg total) by mouth 3 (three) times daily. (Patient not taking: Reported on 06/11/2022) 15 capsule 0   No facility-administered medications prior to visit.    ROS: Review of Systems  Constitutional:  Positive for fatigue. Negative for activity change, appetite change, chills and unexpected weight change.  HENT:  Positive for congestion. Negative for mouth sores and sinus pressure.   Eyes:  Negative for visual disturbance.  Respiratory:  Positive for cough and chest tightness.    Gastrointestinal:  Negative for abdominal pain and nausea.  Genitourinary:  Negative for difficulty urinating, frequency and vaginal pain.  Musculoskeletal:  Negative for back pain and gait problem.  Skin:  Negative for pallor and rash.  Neurological:  Negative for dizziness, tremors, weakness, numbness and headaches.  Psychiatric/Behavioral:  Negative for confusion and sleep disturbance.     Objective:  BP 130/82 (BP Location: Left Arm)   Pulse (!) 56   Temp 97.9 F (36.6 C) (Oral)   Ht 5\' 1"  (1.549 m)   Wt 105 lb 9.6 oz (47.9 kg)   SpO2 90%   BMI 19.95 kg/m   BP Readings from Last 3 Encounters:  06/11/22 130/82  02/17/22 134/76  12/29/21 140/78    Wt Readings from Last 3 Encounters:  06/11/22 105 lb 9.6 oz (47.9 kg)  02/17/22 107 lb (48.5 kg)  12/29/21 110 lb (49.9 kg)    Physical Exam Constitutional:      General: She is not in acute distress.    Appearance: She is well-developed. She is not toxic-appearing.  HENT:     Head: Normocephalic.     Right Ear: External ear normal.     Left Ear: External ear normal.     Nose: Nose normal.  Eyes:     General:        Right eye: No discharge.        Left eye:  No discharge.     Conjunctiva/sclera: Conjunctivae normal.     Pupils: Pupils are equal, round, and reactive to light.  Neck:     Thyroid: No thyromegaly.     Vascular: No JVD.     Trachea: No tracheal deviation.  Cardiovascular:     Rate and Rhythm: Normal rate and regular rhythm.     Heart sounds: Normal heart sounds.  Pulmonary:     Effort: No respiratory distress.     Breath sounds: No stridor. Rhonchi present. No wheezing.  Abdominal:     General: Bowel sounds are normal. There is no distension.     Palpations: Abdomen is soft. There is no mass.     Tenderness: There is no abdominal tenderness. There is no guarding or rebound.  Musculoskeletal:        General: No tenderness.     Cervical back: Normal range of motion and neck supple. No rigidity.   Lymphadenopathy:     Cervical: No cervical adenopathy.  Skin:    Findings: No erythema or rash.  Neurological:     Cranial Nerves: No cranial nerve deficit.     Motor: No abnormal muscle tone.     Coordination: Coordination normal.     Deep Tendon Reflexes: Reflexes normal.  Psychiatric:        Behavior: Behavior normal.        Thought Content: Thought content normal.        Judgment: Judgment normal.     Both bases rhonchi The patient appears tired  Lab Results  Component Value Date   WBC 6.9 12/29/2021   HGB 12.5 12/29/2021   HCT 36.9 12/29/2021   PLT 313.0 12/29/2021   GLUCOSE 118 (H) 12/29/2021   CHOL 279 (H) 12/29/2021   TRIG 174.0 (H) 12/29/2021   HDL 56.50 12/29/2021   LDLDIRECT 125.6 01/16/2013   LDLCALC 188 (H) 12/29/2021   ALT 12 12/29/2021   AST 23 12/29/2021   NA 133 (L) 12/29/2021   K 4.1 12/29/2021   CL 98 12/29/2021   CREATININE 1.08 12/29/2021   BUN 16 12/29/2021   CO2 28 12/29/2021   TSH 2.74 12/29/2021   INR 0.92 04/22/2018   HGBA1C 6.1 08/22/2019   MICROALBUR 2.8 (H) 09/17/2016    CT ANGIO NECK W OR WO CONTRAST  Result Date: 04/23/2018 CLINICAL DATA:  Subarachnoid hemorrhage, proven, follow-up. EXAM: CT ANGIOGRAPHY HEAD AND NECK TECHNIQUE: Multidetector CT imaging of the head and neck was performed using the standard protocol during bolus administration of intravenous contrast. Multiplanar CT image reconstructions and MIPs were obtained to evaluate the vascular anatomy. Carotid stenosis measurements (when applicable) are obtained utilizing NASCET criteria, using the distal internal carotid diameter as the denominator. CONTRAST:  57mL ISOVUE-370 IOPAMIDOL (ISOVUE-370) INJECTION 76% COMPARISON:  The of conservatively or FINDINGS: CT HEAD FINDINGS Brain: Subarachnoid hemorrhage within the right middle frontal sulcus is stable. No new areas of hemorrhage are present. Atrophy and white matter disease is noted bilaterally. No mass lesion is present. No  significant cortical infarct is present. Vascular: Atherosclerotic calcifications are present within the cavernous internal carotid arteries. There is no hyperdense vessel. Skull: Calvarium is intact. No focal lytic or blastic lesions are present. Asymmetric degenerative changes are present at the right TMJ. Sinuses: The paranasal sinuses and mastoid air cells are clear. Orbits: Left lens replacement is present. Globes and orbits are otherwise within normal limits. Review of the MIP images confirms the above findings CTA NECK FINDINGS Aortic arch: A 3  vessel arch configuration is present. No significant stenosis or aneurysm is present. Right carotid system: The right common carotid artery is within normal limits. Dense atherosclerotic calcifications are present at the bifurcation without a significant stenosis relative to the more distal vessel. Left carotid system: The left common carotid artery is within normal limits. Atherosclerotic calcifications are present at the bifurcation without a significant luminal stenosis relative to the more distal vessel. Vertebral arteries: The vertebral arteries are codominant. Both vertebral arteries originate from the subclavian arteries without a significant stenosis. There is some tortuosity of the V1 segments bilaterally. No significant stenosis or vessel injury is present to either vertebral artery in the neck. Skeleton: Degenerative anterolisthesis is present at C2-3, C3-4, and C4-5. There is chronic loss of disc height with endplate sclerosis at 075-GRM and C6-7. Marked lucency surrounds the roots of the residual right maxillary molar. Dental caries are present without periapical disease. Other neck: The soft tissues the neck are otherwise unremarkable. No significant adenopathy is present. No mucosal lesions are evident. Salivary glands are within normal limits bilaterally. Thyroid is normal. Upper chest: Centrilobular emphysematous changes are present bilaterally. No focal  nodule or mass lesion is present. Review of the MIP images confirms the above findings CTA HEAD FINDINGS Anterior circulation: Atherosclerotic calcifications are present within the cavernous internal carotid arteries bilaterally. There is no hyperdense vessel. 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 No definite posterior communicating artery is present. The A1 and M1 segments are normal. The anterior communicating artery is patent. MCA bifurcations are within normal limits bilaterally. The ACA and MCA branch vessels are normal. No focal vascular lesion is evident at the location of the subarachnoid hemorrhage over the right frontal convexity. Posterior circulation: Vertebral arteries are within normal limits bilaterally. PICA origins are visualized and normal. The basilar artery is normal. Both posterior cerebral arteries originate from the basilar tip. PCA branch vessels are within normal limits bilaterally. Venous sinuses: The dural sinuses are patent. Straight sinus and deep cerebral veins are intact. Cortical veins are normal. Anatomic variants: None Delayed phase: Delayed images demonstrate no pathologic enhancement. White matter disease is accentuated. Review of the MIP images confirms the above findings IMPRESSION: 1. No acute or focal vascular lesion to explain subarachnoid hemorrhage over the convexity of the right frontal lobe. 2. No mass lesion. 3. Stable atrophy and white matter disease. 4. 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 Recommend follow-up CTA of the head in 1 year to assess stability. This aneurysm does not correspond with the area of subarachnoid hemorrhage. Electronically Signed   By: San Morelle M.D.   On: 04/23/2018 19:28   CT ANGIO HEAD W OR WO CONTRAST  Result Date: 04/23/2018 CLINICAL DATA:  Subarachnoid hemorrhage, proven, follow-up. EXAM: CT ANGIOGRAPHY HEAD AND NECK TECHNIQUE: Multidetector  CT imaging of the head and neck was performed using the standard protocol during bolus administration of intravenous contrast. Multiplanar CT image reconstructions and MIPs were obtained to evaluate the vascular anatomy. Carotid stenosis measurements (when applicable) are obtained utilizing NASCET criteria, using the distal internal carotid diameter as the denominator. CONTRAST:  76mL ISOVUE-370 IOPAMIDOL (ISOVUE-370) INJECTION 76% COMPARISON:  The of conservatively or FINDINGS: CT HEAD FINDINGS Brain: Subarachnoid hemorrhage within the right middle frontal sulcus is stable. No new areas of hemorrhage are present. Atrophy and white matter disease is noted bilaterally. No mass lesion is present. No significant cortical infarct is present. Vascular: Atherosclerotic calcifications are  present within the cavernous internal carotid arteries. There is no hyperdense vessel. Skull: Calvarium is intact. No focal lytic or blastic lesions are present. Asymmetric degenerative changes are present at the right TMJ. Sinuses: The paranasal sinuses and mastoid air cells are clear. Orbits: Left lens replacement is present. Globes and orbits are otherwise within normal limits. Review of the MIP images confirms the above findings CTA NECK FINDINGS Aortic arch: A 3 vessel arch configuration is present. No significant stenosis or aneurysm is present. Right carotid system: The right common carotid artery is within normal limits. Dense atherosclerotic calcifications are present at the bifurcation without a significant stenosis relative to the more distal vessel. Left carotid system: The left common carotid artery is within normal limits. Atherosclerotic calcifications are present at the bifurcation without a significant luminal stenosis relative to the more distal vessel. Vertebral arteries: The vertebral arteries are codominant. Both vertebral arteries originate from the subclavian arteries without a significant stenosis. There is some  tortuosity of the V1 segments bilaterally. No significant stenosis or vessel injury is present to either vertebral artery in the neck. Skeleton: Degenerative anterolisthesis is present at C2-3, C3-4, and C4-5. There is chronic loss of disc height with endplate sclerosis at 075-GRM and C6-7. Marked lucency surrounds the roots of the residual right maxillary molar. Dental caries are present without periapical disease. Other neck: The soft tissues the neck are otherwise unremarkable. No significant adenopathy is present. No mucosal lesions are evident. Salivary glands are within normal limits bilaterally. Thyroid is normal. Upper chest: Centrilobular emphysematous changes are present bilaterally. No focal nodule or mass lesion is present. Review of the MIP images confirms the above findings CTA HEAD FINDINGS Anterior circulation: Atherosclerotic calcifications are present within the cavernous internal carotid arteries bilaterally. There is no hyperdense vessel. 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 No definite posterior communicating artery is present. The A1 and M1 segments are normal. The anterior communicating artery is patent. MCA bifurcations are within normal limits bilaterally. The ACA and MCA branch vessels are normal. No focal vascular lesion is evident at the location of the subarachnoid hemorrhage over the right frontal convexity. Posterior circulation: Vertebral arteries are within normal limits bilaterally. PICA origins are visualized and normal. The basilar artery is normal. Both posterior cerebral arteries originate from the basilar tip. PCA branch vessels are within normal limits bilaterally. Venous sinuses: The dural sinuses are patent. Straight sinus and deep cerebral veins are intact. Cortical veins are normal. Anatomic variants: None Delayed phase: Delayed images demonstrate no pathologic enhancement. White matter disease is accentuated. Review of the MIP  images confirms the above findings IMPRESSION: 1. No acute or focal vascular lesion to explain subarachnoid hemorrhage over the convexity of the right frontal lobe. 2. No mass lesion. 3. Stable atrophy and white matter disease. 4. 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 Recommend follow-up CTA of the head in 1 year to assess stability. This aneurysm does not correspond with the area of subarachnoid hemorrhage. Electronically Signed   By: San Morelle M.D.   On: 04/23/2018 19:28   ECHOCARDIOGRAM COMPLETE  Result Date: 04/23/2018                            *[facility]*  [address], [place] [address]                            [phone] ------------------------------------------------------------------- Transthoracic Echocardiography Patient:    Yaeko, Roszak MR #:       [id number] Study Date: 04/23/2018 Gender:     F Age:        26 Height:     154.9 cm Weight:     50.6 kg BSA:        1.48 m^2 Pt. Status: Room:       [id number]  ATTENDING    Francine Graven [id number]  PERFORMING   [facility], Inpatient  ADMITTING    Emokpae, Courage  ORDERING     Emokpae, Courage  REFERRING    Emokpae, Courage  SONOGRAPHER  Madelaine Etienne cc: ------------------------------------------------------------------- LV EF: 55% -   60% ------------------------------------------------------------------- Indications:      TIA 435.9. ------------------------------------------------------------------- Study Conclusions - Left ventricle: The cavity size was normal. Wall thickness was   normal. Systolic function was normal. The estimated ejection   fraction was in the range of 55% to 60%. Wall motion was normal;   there were no regional wall motion abnormalities. Doppler   parameters are consistent with abnormal left ventricular   relaxation (grade 1 diastolic dysfunction). - Aortic valve: Valve area  (VTI): 2.42 cm^2. Valve area (Vmax):   2.38 cm^2. Valve area (Vmean): 2.23 cm^2. ------------------------------------------------------------------- Study data:  Comparison was made to the study of 03/02/2018.  Study status:  Routine.  Procedure:  Transthoracic echocardiography. Image quality was good.  Study completion:  There were no complications.          Transthoracic echocardiography.  2D, spectral Doppler, and color Doppler.  Birthdate:  Patient birthdate: Jul 19, 1931.  Age:  Patient is 88 yr old.  Sex:  Gender: female.    BMI: 21.1 kg/m^2.  Blood pressure:     131/72  Patient status:  Inpatient.  Study date:  Study date: 04/23/2018. Study time: 02:16 PM.  Location:  Bedside. ------------------------------------------------------------------- ------------------------------------------------------------------- Left ventricle:  The cavity size was normal. Wall thickness was normal. Systolic function was normal. The estimated ejection fraction was in the range of 55% to 60%. Wall motion was normal; there were no regional wall motion abnormalities. Doppler parameters are consistent with abnormal left ventricular relaxation (grade 1 diastolic dysfunction). ------------------------------------------------------------------- Aortic valve:   Mildly calcified leaflets. Cusp separation was normal.  Doppler:  Transvalvular velocity was within the normal range. There was no stenosis. There was no regurgitation.    VTI ratio of LVOT to aortic valve: 0.77. Valve area (VTI): 2.42 cm^2. Indexed valve area (VTI): 1.64 cm^2/m^2. Peak velocity ratio of LVOT to aortic valve: 0.76. Valve area (Vmax): 2.38 cm^2. Indexed valve area (Vmax): 1.61 cm^2/m^2. Mean velocity ratio of LVOT to aortic valve: 0.71. Valve area (Vmean): 2.23 cm^2. Indexed valve area (Vmean): 1.51 cm^2/m^2.    Mean gradient (S): 3 mm Hg. Peak gradient (S): 5 mm Hg. ------------------------------------------------------------------- Aorta:  Aortic root: The aortic  root was normal in size. Ascending aorta: The ascending aorta was normal in size. ------------------------------------------------------------------- Mitral valve:   Structurally normal valve.   Leaflet separation was normal.  Doppler:  Transvalvular velocity was within the normal range. There was no evidence for stenosis. There was no regurgitation. ------------------------------------------------------------------- Left atrium:  The atrium was normal in size. ------------------------------------------------------------------- Right ventricle:  The cavity size was normal. Wall thickness was normal. Systolic function was normal. ------------------------------------------------------------------- Pulmonic valve:   Poorly visualized.  Doppler:  There was no significant regurgitation. ------------------------------------------------------------------- Tricuspid valve:   Structurally normal valve.   Leaflet separation was normal.  Doppler:  Transvalvular velocity was within the normal range. There was mild regurgitation. ------------------------------------------------------------------- Right atrium:  The atrium was normal in size. ------------------------------------------------------------------- Pericardium:  There was no pericardial effusion. ------------------------------------------------------------------- Systemic veins: Inferior vena cava: The vessel was normal in size. The respirophasic diameter changes were in the normal range (>= 50%), consistent with normal central venous pressure. ------------------------------------------------------------------- Measurements  Left ventricle                           Value          Reference  LV ID, ED, PLAX chordal          (L)     42.7  mm       43 - 52  LV ID, ES, PLAX chordal                  29.4  mm       23 - 38  LV fx shortening, PLAX chordal           31    %        >=29  LV PW thickness, ED                      4.55  mm       ----------  IVS/LV PW ratio, ED               (H)     1.58           <=1.3  Stroke volume, 2D                        65    ml       ----------  Stroke volume/bsa, 2D                    44    ml/m^2   ----------  LV e&', lateral                           5.87  cm/s     ----------  LV E/e&', lateral                         10.75          ----------  LV s&', lateral                           6.42  cm/s     ----------  LV e&', medial                            4.24  cm/s     ----------  LV E/e&', medial                          14.88          ----------  LV e&', average  5.06  cm/s     ----------  LV E/e&', average                         12.48          ----------   Ventricular septum                       Value          Reference  IVS thickness, ED                        7.2   mm       ----------   LVOT                                     Value          Reference  LVOT ID, S                               20    mm       ----------  LVOT area                                3.14  cm^2     ----------  LVOT peak velocity, S                    87.9  cm/s     ----------  LVOT mean velocity, S                    58.4  cm/s     ----------  LVOT VTI, S                              20.7  cm       ----------   Aortic valve                             Value          Reference  Aortic valve peak velocity, S            116   cm/s     ----------  Aortic valve mean velocity, S            82.4  cm/s     ----------  Aortic valve VTI, S                      26.9  cm       ----------  Aortic mean gradient, S                  3     mm Hg    ----------  Aortic peak gradient, S                  5     mm Hg    ----------  VTI ratio, LVOT/AV                       0.77           ----------  Aortic valve area, VTI                   2.42  cm^2     ----------  Aortic valve area/bsa, VTI               1.64  cm^2/m^2 ----------  Velocity ratio, peak, LVOT/AV            0.76           ----------  Aortic valve area, peak velocity         2.38  cm^2     ----------   Aortic valve area/bsa, peak              1.61  cm^2/m^2 ----------  velocity  Velocity ratio, mean, LVOT/AV            0.71           ----------  Aortic valve area, mean velocity         2.23  cm^2     ----------  Aortic valve area/bsa, mean              1.51  cm^2/m^2 ----------  velocity   Aorta                                    Value          Reference  Aortic root ID, ED                       27    mm       ----------   Left atrium                              Value          Reference  LA ID, A-P, ES                           25    mm       ----------  LA ID/bsa, A-P                           1.69  cm/m^2   <=2.2  LA volume, S                             38.7  ml       ----------  LA volume/bsa, S                         26.2  ml/m^2   ----------  LA volume, ES, 1-p A4C                   31.7  ml       ----------  LA volume/bsa, ES, 1-p A4C               21.5  ml/m^2   ----------  LA volume, ES, 1-p A2C                   45.7  ml       ----------  LA volume/bsa, ES, 1-p A2C  30.9  ml/m^2   ----------   Mitral valve                             Value          Reference  Mitral E-wave peak velocity              63.1  cm/s     ----------  Mitral A-wave peak velocity              95.1  cm/s     ----------  Mitral deceleration time         (H)     363   ms       150 - 230  Mitral E/A ratio, peak                   0.7            ----------   Pulmonary veins                          Value          Reference  Pulmonary vein peak velocity, S          50    cm/s     ----------  Pulmonary vein peak velocity, D          28    cm/s     ----------  Pulmonary vein velocity ratio,           1.8            ----------  peak, S/D   Pulmonary arteries                       Value          Reference  PA pressure, S, DP                       27    mm Hg    <=30   Tricuspid valve                          Value          Reference  Tricuspid regurg peak velocity           247   cm/s     ----------  Tricuspid peak RV-RA  gradient            24    mm Hg    ----------   Right atrium                             Value          Reference  RA ID, S-I, ES, A4C              (H)     49.8  mm       34 - 49  RA area, ES, A4C                         13.2  cm^2     8.3 - 19.5  RA volume, ES, A/L                       29  ml       ----------  RA volume/bsa, ES, A/L                   19.6  ml/m^2   ----------   Systemic veins                           Value          Reference  Estimated CVP                            3     mm Hg    ----------   Right ventricle                          Value          Reference  RV ID, ED, PLAX                          33.1  mm       19 - 38  TAPSE                                    19.2  mm       ----------  RV pressure, S, DP                       27    mm Hg    <=30  RV s&', lateral, S                        10.4  cm/s     ----------   Pulmonic valve                           Value          Reference  Pulmonic valve peak velocity, S          84.2  cm/s     ---------- Legend: (L)  and  (H)  mark values outside specified reference range. ------------------------------------------------------------------- Prepared and Electronically Authenticated by Pierre Bali, MD 2019-08-10T15:35:38  EEG adult  Result Date: 04/23/2018 Greta Doom, MD     04/23/2018  1:34 PM History: 88 yo F with intermittent numbness Sedation: None Technique: This is a 21 channel routine scalp EEG performed at the bedside with bipolar and monopolar montages arranged in accordance to the international 10/20 system of electrode placement. One channel was dedicated to EKG recording. Background: The background consists of intermixed alpha and beta activities. There is a well defined posterior dominant rhythm of 9-10 Hz that attenuates with eye opening. Sleep is not recorded. Photic stimulation: Physiologic driving is not performed EEG Abnormalities: None Clinical Interpretation: This normal EEG is recorded in the waking state. There  was no seizure or seizure predisposition recorded on this study. Please note that a normal EEG does not preclude the possibility of epilepsy. Roland Rack, MD [facility] [phone] If 7pm- 7am, please page neurology on call as listed in [facility].   MR Brain Wo Contrast (neuro protocol)  Result Date: 04/22/2018 CLINICAL DATA:  Numbness or tingling, paresthesia. Intermittent left arm numbness for 2 weeks EXAM: MRI HEAD WITHOUT CONTRAST TECHNIQUE: Multiplanar, multiecho  pulse sequences of the brain and surrounding structures were obtained without intravenous contrast. COMPARISON:  CT from earlier today FINDINGS: Brain: Small focus of subarachnoid FLAIR hyperintensity and gradient hypointensity along the precentral gyrus on the right, this area is high-density by CT. No other site of hemorrhage seen, either acute or chronic. There is mild for age chronic small vessel ischemia in the cerebral white matter. Mild cerebral volume loss. Clustered appearance of gyri at the left vertex, nonspecific and likely noncontributory. No underlying mass is seen and there is no extrinsic mass effect by collection. No hydrocephalus. Vascular: Major flow voids are preserved Skull and upper cervical spine: No evidence of marrow lesion. Diffuse cervical facet spurring with mild C3-4 and C4-5 anterolisthesis. Sinuses/Orbits: Left cataract resection. Other: These results were called by telephone at the time of interpretation on 04/22/2018 at 2:10 pm to Dr. Francine Graven , who verbally acknowledged these results. IMPRESSION: 1. Small volume subarachnoid hemorrhage along the high right frontal convexity. Trauma, coagulopathy, or vasculopathy/amyloid are primary considerations in this location. There is normal superior sagittal sinus flow voids. No acute or remote hemorrhage seen elsewhere. 2. Mild atrophy and chronic small vessel ischemia for age. Electronically Signed   By: Monte Fantasia M.D.   On: 04/22/2018 14:14    CT HEAD WO CONTRAST  Result Date: 04/22/2018 CLINICAL DATA:  Left arm numbness. EXAM: CT HEAD WITHOUT CONTRAST CT CERVICAL SPINE WITHOUT CONTRAST TECHNIQUE: Multidetector CT imaging of the head and cervical spine was performed following the standard protocol without intravenous contrast. Multiplanar CT image reconstructions of the cervical spine were also generated. COMPARISON:  None. FINDINGS: CT HEAD FINDINGS Brain: Mild diffuse cortical atrophy is noted. Mild chronic ischemic white matter disease is noted. No mass effect or midline shift is noted. Ventricular size is within normal limits. There is no evidence of mass lesion, hemorrhage or acute infarction. Vascular: No hyperdense vessel or unexpected calcification. Skull: Normal. Negative for fracture or focal lesion. Sinuses/Orbits: No acute finding. Other: None. CT CERVICAL SPINE FINDINGS Alignment: Grade 1 anterolisthesis of C3-4 C4-5 is noted secondary to posterior facet joint hypertrophy. Skull base and vertebrae: No acute fracture. No primary bone lesion or focal pathologic process. Soft tissues and spinal canal: No prevertebral fluid or swelling. No visible canal hematoma. Disc levels: Severe degenerative disc disease is noted at C5-6 and C6-7 with anterior osteophyte formation. Mild degenerative disc disease is noted at C4-5 and C7-T1. Upper chest: Negative. Other: Degenerative changes seen involving posterior facet joints bilaterally, right greater than left. IMPRESSION: Mild diffuse cortical atrophy. Mild chronic ischemic white matter disease. No acute intracranial abnormality seen. Multilevel degenerative disc disease is noted in the cervical spine. No fracture or other acute abnormality is noted. Electronically Signed   By: Marijo Conception, M.D.   On: 04/22/2018 11:09   CT Cervical Spine Wo Contrast  Result Date: 04/22/2018 CLINICAL DATA:  Left arm numbness. EXAM: CT HEAD WITHOUT CONTRAST CT CERVICAL SPINE WITHOUT CONTRAST TECHNIQUE:  Multidetector CT imaging of the head and cervical spine was performed following the standard protocol without intravenous contrast. Multiplanar CT image reconstructions of the cervical spine were also generated. COMPARISON:  None. FINDINGS: CT HEAD FINDINGS Brain: Mild diffuse cortical atrophy is noted. Mild chronic ischemic white matter disease is noted. No mass effect or midline shift is noted. Ventricular size is within normal limits. There is no evidence of mass lesion, hemorrhage or acute infarction. Vascular: No hyperdense vessel or unexpected calcification. Skull: Normal. Negative for fracture or focal  lesion. Sinuses/Orbits: No acute finding. Other: None. CT CERVICAL SPINE FINDINGS Alignment: Grade 1 anterolisthesis of C3-4 C4-5 is noted secondary to posterior facet joint hypertrophy. Skull base and vertebrae: No acute fracture. No primary bone lesion or focal pathologic process. Soft tissues and spinal canal: No prevertebral fluid or swelling. No visible canal hematoma. Disc levels: Severe degenerative disc disease is noted at C5-6 and C6-7 with anterior osteophyte formation. Mild degenerative disc disease is noted at C4-5 and C7-T1. Upper chest: Negative. Other: Degenerative changes seen involving posterior facet joints bilaterally, right greater than left. IMPRESSION: Mild diffuse cortical atrophy. Mild chronic ischemic white matter disease. No acute intracranial abnormality seen. Multilevel degenerative disc disease is noted in the cervical spine. No fracture or other acute abnormality is noted. Electronically Signed   By: Marijo Conception, M.D.   On: 04/22/2018 11:09   DG Chest 2 View  Result Date: 04/22/2018 CLINICAL DATA:  Intermittent LEFT arm numbness for 2 weeks, history hypertension, diabetes mellitus, anxiety, former smoker EXAM: CHEST - 2 VIEW COMPARISON:  02/12/2018 FINDINGS: Normal heart size, mediastinal contours, and pulmonary vascularity. Atherosclerotic calcification aorta. Lungs  hyperinflated with minimal subsegmental atelectasis at LEFT costophrenic angle. Remaining lungs clear. No acute infiltrate, pleural effusion or pneumothorax. Bones appear demineralized with old healed fracture of the posterolateral RIGHT sixth rib. IMPRESSION: Hyperinflated lungs with minimal subsegmental atelectasis at LEFT costophrenic angle. Electronically Signed   By: Lavonia Dana M.D.   On: 04/22/2018 11:07    Assessment & Plan:   Problem List Items Addressed This Visit     Acute bronchopneumonia    Obtain CXR Ctart Omnicef Prom cough syr      Relevant Medications   cefdinir (OMNICEF) 300 MG capsule   promethazine-dextromethorphan (PROMETHAZINE-DM) 6.25-15 MG/5ML syrup   Cough - Primary   Relevant Orders   DG Chest 2 View   Weight loss    Recent, possibly related to acute illness.  Obtain chest x-ray.  It better         Meds ordered this encounter  Medications   cefdinir (OMNICEF) 300 MG capsule    Sig: Take 1 capsule (300 mg total) by mouth 2 (two) times daily.    Dispense:  20 capsule    Refill:  0   promethazine-dextromethorphan (PROMETHAZINE-DM) 6.25-15 MG/5ML syrup    Sig: Take 5 mLs by mouth 4 (four) times daily as needed for cough.    Dispense:  240 mL    Refill:  0      Follow-up: Return for a follow-up visit.  Walker Kehr, MD

## 2022-06-11 NOTE — Assessment & Plan Note (Signed)
Recent, possibly related to acute illness.  Obtain chest x-ray.  It better

## 2022-06-11 NOTE — Assessment & Plan Note (Signed)
Obtain CXR Ctart Omnicef Prom cough syr

## 2022-06-16 ENCOUNTER — Telehealth: Payer: Self-pay | Admitting: Internal Medicine

## 2022-06-16 NOTE — Telephone Encounter (Signed)
Patient would like results of her xrays.

## 2022-06-16 NOTE — Telephone Encounter (Signed)
Pt was called see xray report.Marland KitchenJohny Wilson

## 2022-06-23 ENCOUNTER — Other Ambulatory Visit: Payer: Self-pay | Admitting: Internal Medicine

## 2022-07-15 ENCOUNTER — Telehealth: Payer: Self-pay | Admitting: Internal Medicine

## 2022-07-15 NOTE — Telephone Encounter (Signed)
Called pt she states saw MD 4 weeks ago took med & cough syrup that was rx. Pt states she still having a lot of sinus and nasal congestion. She states the mucus get stuck  in her throat, and its messing w/her eating. She is wanting to know can MD refer to ENT or prescribe something else.Marland KitchenJohny Chess

## 2022-07-15 NOTE — Telephone Encounter (Signed)
Patient asked for return call from nurse as soon as possible. She wouldn't disclose the reason she was calling. Please return call at [phone]

## 2022-07-16 NOTE — Telephone Encounter (Signed)
Call pt gave MD response. Pt only want to see Potnikov. Made appt for 07/23/22 @1 :40.Marland KitchenAndee Poles

## 2022-07-16 NOTE — Telephone Encounter (Signed)
Noted. I'm sorry. Pls see me or one of the providers here. Thx

## 2022-07-17 NOTE — Patient Instructions (Signed)

## 2022-07-17 NOTE — Progress Notes (Signed)
Erroneous encounter

## 2022-07-20 ENCOUNTER — Ambulatory Visit: Payer: Medicare Other | Admitting: *Deleted

## 2022-07-20 DIAGNOSIS — Z0000 Encounter for general adult medical examination without abnormal findings: Secondary | ICD-10-CM

## 2022-07-23 ENCOUNTER — Ambulatory Visit (INDEPENDENT_AMBULATORY_CARE_PROVIDER_SITE_OTHER): Payer: Medicare Other | Admitting: Internal Medicine

## 2022-07-23 ENCOUNTER — Encounter: Payer: Self-pay | Admitting: Internal Medicine

## 2022-07-23 ENCOUNTER — Ambulatory Visit (INDEPENDENT_AMBULATORY_CARE_PROVIDER_SITE_OTHER): Payer: Medicare Other

## 2022-07-23 VITALS — BP 118/82 | HR 57 | Temp 98.0°F | Ht 61.0 in | Wt 102.0 lb

## 2022-07-23 DIAGNOSIS — I10 Essential (primary) hypertension: Secondary | ICD-10-CM | POA: Diagnosis not present

## 2022-07-23 DIAGNOSIS — S2231XA Fracture of one rib, right side, initial encounter for closed fracture: Secondary | ICD-10-CM | POA: Diagnosis not present

## 2022-07-23 DIAGNOSIS — J302 Other seasonal allergic rhinitis: Secondary | ICD-10-CM

## 2022-07-23 DIAGNOSIS — R051 Acute cough: Secondary | ICD-10-CM | POA: Diagnosis not present

## 2022-07-23 DIAGNOSIS — J9811 Atelectasis: Secondary | ICD-10-CM | POA: Diagnosis not present

## 2022-07-23 DIAGNOSIS — R059 Cough, unspecified: Secondary | ICD-10-CM | POA: Diagnosis not present

## 2022-07-23 DIAGNOSIS — I700 Atherosclerosis of aorta: Secondary | ICD-10-CM | POA: Diagnosis not present

## 2022-07-23 DIAGNOSIS — R634 Abnormal weight loss: Secondary | ICD-10-CM | POA: Diagnosis not present

## 2022-07-23 DIAGNOSIS — J189 Pneumonia, unspecified organism: Secondary | ICD-10-CM | POA: Diagnosis not present

## 2022-07-23 MED ORDER — LORATADINE 10 MG PO TABS: 10.0000 mg | ORAL_TABLET | Freq: Every day | ORAL | 3 refills | Status: DC

## 2022-07-23 MED ORDER — FLUTICASONE PROPIONATE 50 MCG/ACT NA SUSP: 2.0000 | Freq: Every day | NASAL | 6 refills | Status: DC

## 2022-07-23 MED ORDER — METHYLPREDNISOLONE ACETATE 80 MG/ML IJ SUSP
80.0000 mg | Freq: Once | INTRAMUSCULAR | Status: AC
  Administered 2022-07-23: 80 mg via INTRAMUSCULAR

## 2022-07-23 NOTE — Assessment & Plan Note (Signed)
Wt Readings from Last 3 Encounters:  07/23/22 102 lb (46.3 kg)  06/11/22 105 lb 9.6 oz (47.9 kg)  02/17/22 107 lb (48.5 kg)   Eat better

## 2022-07-23 NOTE — Progress Notes (Addendum)
Subjective:  Patient ID: Vickie Wilson, female    DOB: [birth year]  Age: 88 y.o. MRN: [id number]  CC: Cough and Nasal Congestion (Pt states its going on 6 weeks)   HPI Vickie Wilson presents for CAP - better now C/o slight cough C/o nasal congestion, d/c F/u wt loss, HTN   Outpatient Medications Prior to Visit  Medication Sig Dispense Refill   acetaminophen (TYLENOL 8 HOUR) 650 MG CR tablet Take 1 tablet (650 mg total) by mouth every 8 (eight) hours as needed for pain. 100 tablet 3   amLODipine (NORVASC) 5 MG tablet Take 1 tablet (5 mg total) by mouth daily. 90 tablet 3   atenolol (TENORMIN) 100 MG tablet TAKE (1/2) TABLET TWICE DAILY. 45 tablet 3   Cholecalciferol (VITAMIN D3) 50 MCG (2000 UT) capsule Take 1 capsule (2,000 Units total) by mouth daily. 100 capsule 3   levETIRAcetam (KEPPRA) 750 MG tablet TAKE ONE TABLET BY MOUTH TWICE DAILY 180 tablet 3   LORazepam (ATIVAN) 0.5 MG tablet Take 1 tablet (0.5 mg total) by mouth 2 (two) times daily as needed for anxiety. 60 tablet 1   Multiple Vitamins-Minerals (CENTRUM SILVER) CHEW Chew 1 each by mouth 3 (three) times a week.     PARoxetine (PAXIL) 20 MG tablet Take 1 tablet (20 mg total) by mouth daily. 90 tablet 1   promethazine-dextromethorphan (PROMETHAZINE-DM) 6.25-15 MG/5ML syrup Take 5 mLs by mouth 4 (four) times daily as needed for cough. 240 mL 0   rosuvastatin (CRESTOR) 20 MG tablet take 1/2 tablet (10mg ) by mouth Monday, Wednesday and Friday of each week.} 19 tablet 1   cefdinir (OMNICEF) 300 MG capsule Take 1 capsule (300 mg total) by mouth 2 (two) times daily. (Patient not taking: Reported on 07/23/2022) 20 capsule 0   No facility-administered medications prior to visit.    ROS: Review of Systems  Constitutional:  Positive for fatigue. Negative for activity change, appetite change, chills and unexpected weight change.  HENT:  Positive for congestion and rhinorrhea. Negative for mouth sores and sinus pressure.   Eyes:   Negative for visual disturbance.  Respiratory:  Positive for cough. Negative for chest tightness, shortness of breath and wheezing.   Cardiovascular:  Negative for leg swelling.  Gastrointestinal:  Negative for abdominal pain and nausea.  Genitourinary:  Negative for difficulty urinating, frequency and vaginal pain.  Musculoskeletal:  Negative for back pain and gait problem.  Skin:  Negative for pallor and rash.  Neurological:  Negative for dizziness, tremors, weakness, numbness and headaches.  Psychiatric/Behavioral:  Negative for confusion and sleep disturbance.     Objective:  BP 118/82 (BP Location: Left Arm)   Pulse (!) 57   Temp 98 F (36.7 C) (Oral)   Ht 5\' 1"  (1.549 m)   Wt 102 lb (46.3 kg)   SpO2 94%   BMI 19.27 kg/m   BP Readings from Last 3 Encounters:  07/23/22 118/82  06/11/22 130/82  02/17/22 134/76    Wt Readings from Last 3 Encounters:  07/23/22 102 lb (46.3 kg)  06/11/22 105 lb 9.6 oz (47.9 kg)  02/17/22 107 lb (48.5 kg)    Physical Exam Constitutional:      General: She is not in acute distress.    Appearance: Normal appearance. She is well-developed.  HENT:     Head: Normocephalic.     Right Ear: External ear normal.     Left Ear: External ear normal.     Nose: Congestion present.  Eyes:     General:        Right eye: No discharge.        Left eye: No discharge.     Conjunctiva/sclera: Conjunctivae normal.     Pupils: Pupils are equal, round, and reactive to light.  Neck:     Thyroid: No thyromegaly.     Vascular: No JVD.     Trachea: No tracheal deviation.  Cardiovascular:     Rate and Rhythm: Normal rate and regular rhythm.     Heart sounds: Normal heart sounds.  Pulmonary:     Effort: No respiratory distress.     Breath sounds: No stridor. No wheezing.  Abdominal:     General: Bowel sounds are normal. There is no distension.     Palpations: Abdomen is soft. There is no mass.     Tenderness: There is no abdominal tenderness. There is  no guarding or rebound.  Musculoskeletal:        General: No tenderness.     Cervical back: Normal range of motion and neck supple. No rigidity.  Lymphadenopathy:     Cervical: No cervical adenopathy.  Skin:    Findings: No erythema or rash.  Neurological:     Cranial Nerves: No cranial nerve deficit.     Motor: No abnormal muscle tone.     Coordination: Coordination normal.     Deep Tendon Reflexes: Reflexes normal.  Psychiatric:        Behavior: Behavior normal.        Thought Content: Thought content normal.        Judgment: Judgment normal.     Lab Results  Component Value Date   WBC 6.9 12/29/2021   HGB 12.5 12/29/2021   HCT 36.9 12/29/2021   PLT 313.0 12/29/2021   GLUCOSE 118 (H) 12/29/2021   CHOL 279 (H) 12/29/2021   TRIG 174.0 (H) 12/29/2021   HDL 56.50 12/29/2021   LDLDIRECT 125.6 01/16/2013   LDLCALC 188 (H) 12/29/2021   ALT 12 12/29/2021   AST 23 12/29/2021   NA 133 (L) 12/29/2021   K 4.1 12/29/2021   CL 98 12/29/2021   CREATININE 1.08 12/29/2021   BUN 16 12/29/2021   CO2 28 12/29/2021   TSH 2.74 12/29/2021   INR 0.92 04/22/2018   HGBA1C 6.1 08/22/2019   MICROALBUR 2.8 (H) 09/17/2016    CT ANGIO NECK W OR WO CONTRAST  Result Date: 04/23/2018 CLINICAL DATA:  Subarachnoid hemorrhage, proven, follow-up. EXAM: CT ANGIOGRAPHY HEAD AND NECK TECHNIQUE: Multidetector CT imaging of the head and neck was performed using the standard protocol during bolus administration of intravenous contrast. Multiplanar CT image reconstructions and MIPs were obtained to evaluate the vascular anatomy. Carotid stenosis measurements (when applicable) are obtained utilizing NASCET criteria, using the distal internal carotid diameter as the denominator. CONTRAST:  47mL ISOVUE-370 IOPAMIDOL (ISOVUE-370) INJECTION 76% COMPARISON:  The of conservatively or FINDINGS: CT HEAD FINDINGS Brain: Subarachnoid hemorrhage within the right middle frontal sulcus is stable. No new areas of hemorrhage  are present. Atrophy and white matter disease is noted bilaterally. No mass lesion is present. No significant cortical infarct is present. Vascular: Atherosclerotic calcifications are present within the cavernous internal carotid arteries. There is no hyperdense vessel. Skull: Calvarium is intact. No focal lytic or blastic lesions are present. Asymmetric degenerative changes are present at the right TMJ. Sinuses: The paranasal sinuses and mastoid air cells are clear. Orbits: Left lens replacement is present. Globes and orbits are otherwise within normal limits.  Review of the MIP images confirms the above findings CTA NECK FINDINGS Aortic arch: A 3 vessel arch configuration is present. No significant stenosis or aneurysm is present. Right carotid system: The right common carotid artery is within normal limits. Dense atherosclerotic calcifications are present at the bifurcation without a significant stenosis relative to the more distal vessel. Left carotid system: The left common carotid artery is within normal limits. Atherosclerotic calcifications are present at the bifurcation without a significant luminal stenosis relative to the more distal vessel. Vertebral arteries: The vertebral arteries are codominant. Both vertebral arteries originate from the subclavian arteries without a significant stenosis. There is some tortuosity of the V1 segments bilaterally. No significant stenosis or vessel injury is present to either vertebral artery in the neck. Skeleton: Degenerative anterolisthesis is present at C2-3, C3-4, and C4-5. There is chronic loss of disc height with endplate sclerosis at C5-6 and C6-7. Marked lucency surrounds the roots of the residual right maxillary molar. Dental caries are present without periapical disease. Other neck: The soft tissues the neck are otherwise unremarkable. No significant adenopathy is present. No mucosal lesions are evident. Salivary glands are within normal limits bilaterally.  Thyroid is normal. Upper chest: Centrilobular emphysematous changes are present bilaterally. No focal nodule or mass lesion is present. Review of the MIP images confirms the above findings CTA HEAD FINDINGS Anterior circulation: Atherosclerotic calcifications are present within the cavernous internal carotid arteries bilaterally. There is no hyperdense vessel. 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 No definite posterior communicating artery is present. The A1 and M1 segments are normal. The anterior communicating artery is patent. MCA bifurcations are within normal limits bilaterally. The ACA and MCA branch vessels are normal. No focal vascular lesion is evident at the location of the subarachnoid hemorrhage over the right frontal convexity. Posterior circulation: Vertebral arteries are within normal limits bilaterally. PICA origins are visualized and normal. The basilar artery is normal. Both posterior cerebral arteries originate from the basilar tip. PCA branch vessels are within normal limits bilaterally. Venous sinuses: The dural sinuses are patent. Straight sinus and deep cerebral veins are intact. Cortical veins are normal. Anatomic variants: None Delayed phase: Delayed images demonstrate no pathologic enhancement. White matter disease is accentuated. Review of the MIP images confirms the above findings IMPRESSION: 1. No acute or focal vascular lesion to explain subarachnoid hemorrhage over the convexity of the right frontal lobe. 2. No mass lesion. 3. Stable atrophy and white matter disease. 4. 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 Recommend follow-up CTA of the head in 1 year to assess stability. This aneurysm does not correspond with the area of subarachnoid hemorrhage. Electronically Signed   By: Marin Roberts M.D.   On: 04/23/2018 19:28   CT ANGIO HEAD W OR WO CONTRAST  Result Date: 04/23/2018 CLINICAL DATA:   Subarachnoid hemorrhage, proven, follow-up. EXAM: CT ANGIOGRAPHY HEAD AND NECK TECHNIQUE: Multidetector CT imaging of the head and neck was performed using the standard protocol during bolus administration of intravenous contrast. Multiplanar CT image reconstructions and MIPs were obtained to evaluate the vascular anatomy. Carotid stenosis measurements (when applicable) are obtained utilizing NASCET criteria, using the distal internal carotid diameter as the denominator. CONTRAST:  77mL ISOVUE-370 IOPAMIDOL (ISOVUE-370) INJECTION 76% COMPARISON:  The of conservatively or FINDINGS: CT HEAD FINDINGS Brain: Subarachnoid hemorrhage within the right middle frontal sulcus is stable. No new areas of hemorrhage are present. Atrophy and white matter disease is noted  bilaterally. No mass lesion is present. No significant cortical infarct is present. Vascular: Atherosclerotic calcifications are present within the cavernous internal carotid arteries. There is no hyperdense vessel. Skull: Calvarium is intact. No focal lytic or blastic lesions are present. Asymmetric degenerative changes are present at the right TMJ. Sinuses: The paranasal sinuses and mastoid air cells are clear. Orbits: Left lens replacement is present. Globes and orbits are otherwise within normal limits. Review of the MIP images confirms the above findings CTA NECK FINDINGS Aortic arch: A 3 vessel arch configuration is present. No significant stenosis or aneurysm is present. Right carotid system: The right common carotid artery is within normal limits. Dense atherosclerotic calcifications are present at the bifurcation without a significant stenosis relative to the more distal vessel. Left carotid system: The left common carotid artery is within normal limits. Atherosclerotic calcifications are present at the bifurcation without a significant luminal stenosis relative to the more distal vessel. Vertebral arteries: The vertebral arteries are codominant. Both  vertebral arteries originate from the subclavian arteries without a significant stenosis. There is some tortuosity of the V1 segments bilaterally. No significant stenosis or vessel injury is present to either vertebral artery in the neck. Skeleton: Degenerative anterolisthesis is present at C2-3, C3-4, and C4-5. There is chronic loss of disc height with endplate sclerosis at 075-GRM and C6-7. Marked lucency surrounds the roots of the residual right maxillary molar. Dental caries are present without periapical disease. Other neck: The soft tissues the neck are otherwise unremarkable. No significant adenopathy is present. No mucosal lesions are evident. Salivary glands are within normal limits bilaterally. Thyroid is normal. Upper chest: Centrilobular emphysematous changes are present bilaterally. No focal nodule or mass lesion is present. Review of the MIP images confirms the above findings CTA HEAD FINDINGS Anterior circulation: Atherosclerotic calcifications are present within the cavernous internal carotid arteries bilaterally. There is no hyperdense vessel. 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 No definite posterior communicating artery is present. The A1 and M1 segments are normal. The anterior communicating artery is patent. MCA bifurcations are within normal limits bilaterally. The ACA and MCA branch vessels are normal. No focal vascular lesion is evident at the location of the subarachnoid hemorrhage over the right frontal convexity. Posterior circulation: Vertebral arteries are within normal limits bilaterally. PICA origins are visualized and normal. The basilar artery is normal. Both posterior cerebral arteries originate from the basilar tip. PCA branch vessels are within normal limits bilaterally. Venous sinuses: The dural sinuses are patent. Straight sinus and deep cerebral veins are intact. Cortical veins are normal. Anatomic variants: None Delayed phase: Delayed  images demonstrate no pathologic enhancement. White matter disease is accentuated. Review of the MIP images confirms the above findings IMPRESSION: 1. No acute or focal vascular lesion to explain subarachnoid hemorrhage over the convexity of the right frontal lobe. 2. No mass lesion. 3. Stable atrophy and white matter disease. 4. 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 Recommend follow-up CTA of the head in 1 year to assess stability. This aneurysm does not correspond with the area of subarachnoid hemorrhage. Electronically Signed   By: San Morelle M.D.   On: 04/23/2018 19:28   ECHOCARDIOGRAM COMPLETE  Result Date: 04/23/2018                            [facility]*                   *  [facility]*                         [address]                        [place], [place] [address]                            [phone] ------------------------------------------------------------------- Transthoracic Echocardiography Patient:    Neira, Bentsen MR #:       [id number] Study Date: 04/23/2018 Gender:     F Age:        87 Height:     154.9 cm Weight:     50.6 kg BSA:        1.48 m^2 Pt. Status: Room:       3W19C  ATTENDING    Samuel Jester [id number]  PERFORMING   [facility], Inpatient  ADMITTING    Emokpae, Courage  ORDERING     Emokpae, Courage  REFERRING    Emokpae, Courage  SONOGRAPHER  Belva Chimes cc: ------------------------------------------------------------------- LV EF: 55% -   60% ------------------------------------------------------------------- Indications:      TIA 435.9. ------------------------------------------------------------------- Study Conclusions - Left ventricle: The cavity size was normal. Wall thickness was   normal. Systolic function was normal. The estimated ejection   fraction was in the range of 55% to 60%. Wall motion was normal;   there were no regional wall motion abnormalities. Doppler   parameters are consistent  with abnormal left ventricular   relaxation (grade 1 diastolic dysfunction). - Aortic valve: Valve area (VTI): 2.42 cm^2. Valve area (Vmax):   2.38 cm^2. Valve area (Vmean): 2.23 cm^2. ------------------------------------------------------------------- Study data:  Comparison was made to the study of 03/02/2018.  Study status:  Routine.  Procedure:  Transthoracic echocardiography. Image quality was good.  Study completion:  There were no complications.          Transthoracic echocardiography.  2D, spectral Doppler, and color Doppler.  Birthdate:  Patient birthdate: October 03, 1930.  Age:  Patient is 88 yr old.  Sex:  Gender: female.    BMI: 21.1 kg/m^2.  Blood pressure:     131/72  Patient status:  Inpatient.  Study date:  Study date: 04/23/2018. Study time: 02:16 PM.  Location:  Bedside. ------------------------------------------------------------------- ------------------------------------------------------------------- Left ventricle:  The cavity size was normal. Wall thickness was normal. Systolic function was normal. The estimated ejection fraction was in the range of 55% to 60%. Wall motion was normal; there were no regional wall motion abnormalities. Doppler parameters are consistent with abnormal left ventricular relaxation (grade 1 diastolic dysfunction). ------------------------------------------------------------------- Aortic valve:   Mildly calcified leaflets. Cusp separation was normal.  Doppler:  Transvalvular velocity was within the normal range. There was no stenosis. There was no regurgitation.    VTI ratio of LVOT to aortic valve: 0.77. Valve area (VTI): 2.42 cm^2. Indexed valve area (VTI): 1.64 cm^2/m^2. Peak velocity ratio of LVOT to aortic valve: 0.76. Valve area (Vmax): 2.38 cm^2. Indexed valve area (Vmax): 1.61 cm^2/m^2. Mean velocity ratio of LVOT to aortic valve: 0.71. Valve area (Vmean): 2.23 cm^2. Indexed valve area (Vmean): 1.51 cm^2/m^2.    Mean gradient (S): 3 mm Hg. Peak gradient (S): 5 mm  Hg. ------------------------------------------------------------------- Aorta:  Aortic root: The aortic root was normal in size. Ascending aorta: The ascending aorta was normal in size. ------------------------------------------------------------------- Mitral valve:   Structurally normal valve.  Leaflet separation was normal.  Doppler:  Transvalvular velocity was within the normal range. There was no evidence for stenosis. There was no regurgitation. ------------------------------------------------------------------- Left atrium:  The atrium was normal in size. ------------------------------------------------------------------- Right ventricle:  The cavity size was normal. Wall thickness was normal. Systolic function was normal. ------------------------------------------------------------------- Pulmonic valve:   Poorly visualized.  Doppler:  There was no significant regurgitation. ------------------------------------------------------------------- Tricuspid valve:   Structurally normal valve.   Leaflet separation was normal.  Doppler:  Transvalvular velocity was within the normal range. There was mild regurgitation. ------------------------------------------------------------------- Right atrium:  The atrium was normal in size. ------------------------------------------------------------------- Pericardium:  There was no pericardial effusion. ------------------------------------------------------------------- Systemic veins: Inferior vena cava: The vessel was normal in size. The respirophasic diameter changes were in the normal range (>= 50%), consistent with normal central venous pressure. ------------------------------------------------------------------- Measurements  Left ventricle                           Value          Reference  LV ID, ED, PLAX chordal          (L)     42.7  mm       43 - 52  LV ID, ES, PLAX chordal                  29.4  mm       23 - 38  LV fx shortening, PLAX chordal           31    %         >=29  LV PW thickness, ED                      4.55  mm       ----------  IVS/LV PW ratio, ED              (H)     1.58           <=1.3  Stroke volume, 2D                        65    ml       ----------  Stroke volume/bsa, 2D                    44    ml/m^2   ----------  LV e&', lateral                           5.87  cm/s     ----------  LV E/e&', lateral                         10.75          ----------  LV s&', lateral                           6.42  cm/s     ----------  LV e&', medial                            4.24  cm/s     ----------  LV E/e&', medial  14.88          ----------  LV e&', average                           5.06  cm/s     ----------  LV E/e&', average                         12.48          ----------   Ventricular septum                       Value          Reference  IVS thickness, ED                        7.2   mm       ----------   LVOT                                     Value          Reference  LVOT ID, S                               20    mm       ----------  LVOT area                                3.14  cm^2     ----------  LVOT peak velocity, S                    87.9  cm/s     ----------  LVOT mean velocity, S                    58.4  cm/s     ----------  LVOT VTI, S                              20.7  cm       ----------   Aortic valve                             Value          Reference  Aortic valve peak velocity, S            116   cm/s     ----------  Aortic valve mean velocity, S            82.4  cm/s     ----------  Aortic valve VTI, S                      26.9  cm       ----------  Aortic mean gradient, S                  3     mm Hg    ----------  Aortic peak gradient, S                  5     mm Hg    ----------  VTI ratio, LVOT/AV                       0.77           ----------  Aortic valve area, VTI                   2.42  cm^2     ----------  Aortic valve area/bsa, VTI               1.64  cm^2/m^2 ----------  Velocity ratio, peak, LVOT/AV             0.76           ----------  Aortic valve area, peak velocity         2.38  cm^2     ----------  Aortic valve area/bsa, peak              1.61  cm^2/m^2 ----------  velocity  Velocity ratio, mean, LVOT/AV            0.71           ----------  Aortic valve area, mean velocity         2.23  cm^2     ----------  Aortic valve area/bsa, mean              1.51  cm^2/m^2 ----------  velocity   Aorta                                    Value          Reference  Aortic root ID, ED                       27    mm       ----------   Left atrium                              Value          Reference  LA ID, A-P, ES                           25    mm       ----------  LA ID/bsa, A-P                           1.69  cm/m^2   <=2.2  LA volume, S                             38.7  ml       ----------  LA volume/bsa, S                         26.2  ml/m^2   ----------  LA volume, ES, 1-p A4C                   31.7  ml       ----------  LA volume/bsa, ES, 1-p A4C               21.5  ml/m^2   ----------  LA volume, ES, 1-p A2C  45.7  ml       ----------  LA volume/bsa, ES, 1-p A2C               30.9  ml/m^2   ----------   Mitral valve                             Value          Reference  Mitral E-wave peak velocity              63.1  cm/s     ----------  Mitral A-wave peak velocity              95.1  cm/s     ----------  Mitral deceleration time         (H)     363   ms       150 - 230  Mitral E/A ratio, peak                   0.7            ----------   Pulmonary veins                          Value          Reference  Pulmonary vein peak velocity, S          50    cm/s     ----------  Pulmonary vein peak velocity, D          28    cm/s     ----------  Pulmonary vein velocity ratio,           1.8            ----------  peak, S/D   Pulmonary arteries                       Value          Reference  PA pressure, S, DP                       27    mm Hg    <=30   Tricuspid valve                          Value           Reference  Tricuspid regurg peak velocity           247   cm/s     ----------  Tricuspid peak RV-RA gradient            24    mm Hg    ----------   Right atrium                             Value          Reference  RA ID, S-I, ES, A4C              (H)     49.8  mm       34 - 49  RA area, ES, A4C                         13.2  cm^2     8.3 -  19.5  RA volume, ES, A/L                       29    ml       ----------  RA volume/bsa, ES, A/L                   19.6  ml/m^2   ----------   Systemic veins                           Value          Reference  Estimated CVP                            3     mm Hg    ----------   Right ventricle                          Value          Reference  RV ID, ED, PLAX                          33.1  mm       19 - 38  TAPSE                                    19.2  mm       ----------  RV pressure, S, DP                       27    mm Hg    <=30  RV s&', lateral, S                        10.4  cm/s     ----------   Pulmonic valve                           Value          Reference  Pulmonic valve peak velocity, S          84.2  cm/s     ---------- Legend: (L)  and  (H)  mark values outside specified reference range. ------------------------------------------------------------------- Prepared and Electronically Authenticated by Nicholes Mango, MD 2019-08-10T15:35:38  EEG adult  Result Date: 04/23/2018 Rejeana Brock, MD     04/23/2018  1:34 PM History: 88 yo F with intermittent numbness Sedation: None Technique: This is a 21 channel routine scalp EEG performed at the bedside with bipolar and monopolar montages arranged in accordance to the international 10/20 system of electrode placement. One channel was dedicated to EKG recording. Background: The background consists of intermixed alpha and beta activities. There is a well defined posterior dominant rhythm of 9-10 Hz that attenuates with eye opening. Sleep is not recorded. Photic stimulation: Physiologic driving is not performed EEG  Abnormalities: None Clinical Interpretation: This normal EEG is recorded in the waking state. There was no seizure or seizure predisposition recorded on this study. Please note that a normal EEG does not preclude the possibility of epilepsy. Ritta Slot, MD [facility] [phone] If 7pm- 7am, please page neurology on call as listed in AMION.  MR Brain Wo Contrast (neuro protocol)  Result Date: 04/22/2018 CLINICAL DATA:  Numbness or tingling, paresthesia. Intermittent left arm numbness for 2 weeks EXAM: MRI HEAD WITHOUT CONTRAST TECHNIQUE: Multiplanar, multiecho pulse sequences of the brain and surrounding structures were obtained without intravenous contrast. COMPARISON:  CT from earlier today FINDINGS: Brain: Small focus of subarachnoid FLAIR hyperintensity and gradient hypointensity along the precentral gyrus on the right, this area is high-density by CT. No other site of hemorrhage seen, either acute or chronic. There is mild for age chronic small vessel ischemia in the cerebral white matter. Mild cerebral volume loss. Clustered appearance of gyri at the left vertex, nonspecific and likely noncontributory. No underlying mass is seen and there is no extrinsic mass effect by collection. No hydrocephalus. Vascular: Major flow voids are preserved Skull and upper cervical spine: No evidence of marrow lesion. Diffuse cervical facet spurring with mild C3-4 and C4-5 anterolisthesis. Sinuses/Orbits: Left cataract resection. Other: These results were called by telephone at the time of interpretation on 04/22/2018 at 2:10 pm to Dr. Francine Graven , who verbally acknowledged these results. IMPRESSION: 1. Small volume subarachnoid hemorrhage along the high right frontal convexity. Trauma, coagulopathy, or vasculopathy/amyloid are primary considerations in this location. There is normal superior sagittal sinus flow voids. No acute or remote hemorrhage seen elsewhere. 2. Mild atrophy and chronic small  vessel ischemia for age. Electronically Signed   By: Monte Fantasia M.D.   On: 04/22/2018 14:14   CT HEAD WO CONTRAST  Result Date: 04/22/2018 CLINICAL DATA:  Left arm numbness. EXAM: CT HEAD WITHOUT CONTRAST CT CERVICAL SPINE WITHOUT CONTRAST TECHNIQUE: Multidetector CT imaging of the head and cervical spine was performed following the standard protocol without intravenous contrast. Multiplanar CT image reconstructions of the cervical spine were also generated. COMPARISON:  None. FINDINGS: CT HEAD FINDINGS Brain: Mild diffuse cortical atrophy is noted. Mild chronic ischemic white matter disease is noted. No mass effect or midline shift is noted. Ventricular size is within normal limits. There is no evidence of mass lesion, hemorrhage or acute infarction. Vascular: No hyperdense vessel or unexpected calcification. Skull: Normal. Negative for fracture or focal lesion. Sinuses/Orbits: No acute finding. Other: None. CT CERVICAL SPINE FINDINGS Alignment: Grade 1 anterolisthesis of C3-4 C4-5 is noted secondary to posterior facet joint hypertrophy. Skull base and vertebrae: No acute fracture. No primary bone lesion or focal pathologic process. Soft tissues and spinal canal: No prevertebral fluid or swelling. No visible canal hematoma. Disc levels: Severe degenerative disc disease is noted at C5-6 and C6-7 with anterior osteophyte formation. Mild degenerative disc disease is noted at C4-5 and C7-T1. Upper chest: Negative. Other: Degenerative changes seen involving posterior facet joints bilaterally, right greater than left. IMPRESSION: Mild diffuse cortical atrophy. Mild chronic ischemic white matter disease. No acute intracranial abnormality seen. Multilevel degenerative disc disease is noted in the cervical spine. No fracture or other acute abnormality is noted. Electronically Signed   By: Marijo Conception, M.D.   On: 04/22/2018 11:09   CT Cervical Spine Wo Contrast  Result Date: 04/22/2018 CLINICAL DATA:  Left arm  numbness. EXAM: CT HEAD WITHOUT CONTRAST CT CERVICAL SPINE WITHOUT CONTRAST TECHNIQUE: Multidetector CT imaging of the head and cervical spine was performed following the standard protocol without intravenous contrast. Multiplanar CT image reconstructions of the cervical spine were also generated. COMPARISON:  None. FINDINGS: CT HEAD FINDINGS Brain: Mild diffuse cortical atrophy is noted. Mild chronic ischemic white matter disease is noted. No mass effect or midline shift is  noted. Ventricular size is within normal limits. There is no evidence of mass lesion, hemorrhage or acute infarction. Vascular: No hyperdense vessel or unexpected calcification. Skull: Normal. Negative for fracture or focal lesion. Sinuses/Orbits: No acute finding. Other: None. CT CERVICAL SPINE FINDINGS Alignment: Grade 1 anterolisthesis of C3-4 C4-5 is noted secondary to posterior facet joint hypertrophy. Skull base and vertebrae: No acute fracture. No primary bone lesion or focal pathologic process. Soft tissues and spinal canal: No prevertebral fluid or swelling. No visible canal hematoma. Disc levels: Severe degenerative disc disease is noted at C5-6 and C6-7 with anterior osteophyte formation. Mild degenerative disc disease is noted at C4-5 and C7-T1. Upper chest: Negative. Other: Degenerative changes seen involving posterior facet joints bilaterally, right greater than left. IMPRESSION: Mild diffuse cortical atrophy. Mild chronic ischemic white matter disease. No acute intracranial abnormality seen. Multilevel degenerative disc disease is noted in the cervical spine. No fracture or other acute abnormality is noted. Electronically Signed   By: Marijo Conception, M.D.   On: 04/22/2018 11:09   DG Chest 2 View  Result Date: 04/22/2018 CLINICAL DATA:  Intermittent LEFT arm numbness for 2 weeks, history hypertension, diabetes mellitus, anxiety, former smoker EXAM: CHEST - 2 VIEW COMPARISON:  02/12/2018 FINDINGS: Normal heart size, mediastinal  contours, and pulmonary vascularity. Atherosclerotic calcification aorta. Lungs hyperinflated with minimal subsegmental atelectasis at LEFT costophrenic angle. Remaining lungs clear. No acute infiltrate, pleural effusion or pneumothorax. Bones appear demineralized with old healed fracture of the posterolateral RIGHT sixth rib. IMPRESSION: Hyperinflated lungs with minimal subsegmental atelectasis at LEFT costophrenic angle. Electronically Signed   By: Lavonia Dana M.D.   On: 04/22/2018 11:07    Assessment & Plan:   Problem List Items Addressed This Visit     Allergic rhinitis    Worse Depo-medrol IM Loratidine qd Flonase QD      Relevant Orders   DG Chest 2 View   HTN (hypertension)    Cont on Norvasc, Atenolol      Weight loss    Wt Readings from Last 3 Encounters:  07/23/22 102 lb (46.3 kg)  06/11/22 105 lb 9.6 oz (47.9 kg)  02/17/22 107 lb (48.5 kg)  Eat better      CAP (community acquired pneumonia) - Primary    Better overall. Repeat CXR Depo-medrol Prom cough syr      Relevant Medications   loratadine (CLARITIN) 10 MG tablet   fluticasone (FLONASE) 50 MCG/ACT nasal spray   Other Relevant Orders   DG Chest 2 View      Meds ordered this encounter  Medications   loratadine (CLARITIN) 10 MG tablet    Sig: Take 1 tablet (10 mg total) by mouth daily.    Dispense:  30 tablet    Refill:  3   fluticasone (FLONASE) 50 MCG/ACT nasal spray    Sig: Place 2 sprays into both nostrils daily.    Dispense:  16 g    Refill:  6      Follow-up: Return in about 4 weeks (around 08/20/2022) for a follow-up visit.  Walker Kehr, MD

## 2022-07-23 NOTE — Assessment & Plan Note (Addendum)
Better overall. Repeat CXR Depo-medrol Prom cough syr

## 2022-07-23 NOTE — Addendum Note (Signed)
Addended by: Deatra James on: 07/23/2022 02:07 PM   Modules accepted: Orders

## 2022-07-23 NOTE — Progress Notes (Addendum)
Pls cosign for DEPO inj../lmb  Medical screening examination/treatment/procedure(s) were performed by non-physician practitioner and as supervising physician I was immediately available for consultation/collaboration.  I agree with above. Aleksei Plotnikov, MD   

## 2022-07-23 NOTE — Assessment & Plan Note (Signed)
Worse Depo-medrol IM Loratidine qd Flonase QD

## 2022-07-23 NOTE — Assessment & Plan Note (Signed)
Cont on Norvasc, Atenolol 

## 2022-08-04 DIAGNOSIS — Z961 Presence of intraocular lens: Secondary | ICD-10-CM | POA: Diagnosis not present

## 2022-08-04 DIAGNOSIS — H353131 Nonexudative age-related macular degeneration, bilateral, early dry stage: Secondary | ICD-10-CM | POA: Diagnosis not present

## 2022-08-04 DIAGNOSIS — H52203 Unspecified astigmatism, bilateral: Secondary | ICD-10-CM | POA: Diagnosis not present

## 2022-08-17 ENCOUNTER — Other Ambulatory Visit: Payer: Self-pay | Admitting: *Deleted

## 2022-08-17 MED ORDER — AMLODIPINE BESYLATE 5 MG PO TABS: 5.0000 mg | ORAL_TABLET | Freq: Every day | ORAL | 3 refills | Status: DC

## 2022-08-17 MED ORDER — ROSUVASTATIN CALCIUM 20 MG PO TABS: ORAL_TABLET | ORAL | 1 refills | Status: DC

## 2022-08-17 NOTE — Patient Instructions (Signed)

## 2022-08-17 NOTE — Progress Notes (Addendum)
Subjective:   Vickie Wilson is a 88 y.o. female who presents for Medicare Annual (Subsequent) preventive examination. I connected with  Murlean Caller on 08/18/22 by a audio enabled telemedicine application and verified that I am speaking with the correct person using two identifiers.  Patient Location: Home  Provider Location: Home Office  I discussed the limitations of evaluation and management by telemedicine. The patient expressed understanding and agreed to proceed.   Review of Systems    Deferred to PCP Cardiac Risk Factors include: advanced age (>58men, >75 women);dyslipidemia;hypertension     Objective:    There were no vitals filed for this visit. There is no height or weight on file to calculate BMI.     08/18/2022   10:23 AM 07/16/2021    4:39 PM 01/04/2020   12:44 PM 10/05/2018    4:54 PM 05/13/2018   10:21 AM 04/22/2018   12:34 PM 04/22/2018   12:04 PM  Advanced Directives  Does Patient Have a Medical Advance Directive? Yes Yes Yes Yes Yes Yes Yes  Type of Estate agent of [place];Living will Living will Healthcare Power of [place];Living will Healthcare Power of [place];Living will Healthcare Power of [place];Living will  Healthcare Power of [place];Living will  Does patient want to make changes to medical advance directive? No - Patient declined No - Patient declined No - Patient declined  No - Patient declined No - Patient declined   Copy of Healthcare Power of Attorney in Chart? No - copy requested  No - copy requested No - copy requested No - copy requested  No - copy requested    Current Medications (verified) Outpatient Encounter Medications as of 08/18/2022  Medication Sig   acetaminophen (TYLENOL 8 HOUR) 650 MG CR tablet Take 1 tablet (650 mg total) by mouth every 8 (eight) hours as needed for pain.   amLODipine (NORVASC) 5 MG tablet Take 1 tablet (5 mg total) by mouth daily.   atenolol (TENORMIN) 100 MG tablet TAKE (1/2) TABLET  TWICE DAILY.   Cholecalciferol (VITAMIN D3) 50 MCG (2000 UT) capsule Take 1 capsule (2,000 Units total) by mouth daily.   fluticasone (FLONASE) 50 MCG/ACT nasal spray Place 2 sprays into both nostrils daily.   levETIRAcetam (KEPPRA) 750 MG tablet TAKE ONE TABLET BY MOUTH TWICE DAILY   loratadine (CLARITIN) 10 MG tablet Take 1 tablet (10 mg total) by mouth daily.   Multiple Vitamins-Minerals (CENTRUM SILVER) CHEW Chew 1 each by mouth 3 (three) times a week.   PARoxetine (PAXIL) 20 MG tablet Take 1 tablet (20 mg total) by mouth daily.   promethazine-dextromethorphan (PROMETHAZINE-DM) 6.25-15 MG/5ML syrup Take 5 mLs by mouth 4 (four) times daily as needed for cough.   rosuvastatin (CRESTOR) 20 MG tablet take 1/2 tablet ( ) by mouth Monday, Wednesday and Friday of each week.}   LORazepam (ATIVAN) 0.5 MG tablet Take 1 tablet (0.5 mg total) by mouth 2 (two) times daily as needed for anxiety. (Patient not taking: Reported on 08/18/2022)   [DISCONTINUED] amLODipine (NORVASC) 5 MG tablet Take 1 tablet (5 mg total) by mouth daily.   [DISCONTINUED] rosuvastatin (CRESTOR) 20 MG tablet take 1/2 tablet ( ) by mouth Monday, Wednesday and Friday of each week.}   No facility-administered encounter medications on file as of 08/18/2022.    Allergies (verified) Patient has no active allergies.   History: Past Medical History:  Diagnosis Date   Allergy    Anxiety    Atrial mass    right  Constipation    Depression    Diabetes mellitus    Hyperlipidemia    Hypertension    Osteoporosis    Seizures (HCC)    Past Surgical History:  Procedure Laterality Date   DILATION AND CURETTAGE OF UTERUS  1987   Family History  Problem Relation Age of Onset   Heart disease Mother    Alcohol abuse Father    Heart disease Father    Hyperlipidemia Other    Hypertension Other    Stroke Other    Social History   Socioeconomic History   Marital status: Single    Spouse name: Not on file   Number of  children: 1   Years of education: Not on file   Highest education level: 12th grade  Occupational History   Occupation: retired  Tobacco Use   Smoking status: Former    Packs/day: 1.00    Years: 20.00    Total pack years: 20.00    Types: Cigarettes    Quit date: 03/13/1998    Years since quitting: 24.4   Smokeless tobacco: Never  Vaping Use   Vaping Use: Never used  Substance and Sexual Activity   Alcohol use: No   Drug use: No   Sexual activity: Not Currently  Other Topics Concern   Not on file  Social History Narrative   Regular exercise-yes      Daily caffeine use      Right handed       03/27/21 Lives at home alone    Social Determinants of Health   Financial Resource Strain: Low Risk  (08/18/2022)   Overall Financial Resource Strain (CARDIA)    Difficulty of Paying Living Expenses: Not hard at all  Food Insecurity: No Food Insecurity (08/18/2022)   Hunger Vital Sign    Worried About Running Out of Food in the Last Year: Never true    Ran Out of Food in the Last Year: Never true  Transportation Needs: No Transportation Needs (08/18/2022)   PRAPARE - Administrator, Civil Service (Medical): No    Lack of Transportation (Non-Medical): No  Physical Activity: Inactive (08/18/2022)   Exercise Vital Sign    Days of Exercise per Week: 0 days    Minutes of Exercise per Session: 0 min  Stress: No Stress Concern Present (08/18/2022)   [employer] of Occupational Health - Occupational Stress Questionnaire    Feeling of Stress : Only a little  Social Connections: Moderately Integrated (08/18/2022)   Social Connection and Isolation Panel [NHANES]    Frequency of Communication with Friends and Family: More than three times a week    Frequency of Social Gatherings with Friends and Family: More than three times a week    Attends Religious Services: More than 4 times per year    Active Member of [employer] or Organizations: Yes    Attends Banker Meetings:  More than 4 times per year    Marital Status: Widowed    Tobacco Counseling Counseling given: Not Answered   Clinical Intake:  Pre-visit preparation completed: Yes  Pain : No/denies pain     Nutritional Status: BMI of 19-24  Normal Nutritional Risks: None Diabetes: No  How often do you need to have someone help you when you read instructions, pamphlets, or other written materials from your doctor or pharmacy?: 1 - Never What is the last grade level you completed in school?: 12th  Diabetic?No  Interpreter Needed?: No  Information entered by ::  Blanchie Serve RN   Activities of Daily Living    08/18/2022   10:14 AM  In your present state of health, do you have any difficulty performing the following activities:  Hearing? 0  Vision? 0  Difficulty concentrating or making decisions? 0  Walking or climbing stairs? 0  Dressing or bathing? 0  Doing errands, shopping? 0  Preparing Food and eating ? N  Using the Toilet? N  In the past six months, have you accidently leaked urine? N  Do you have problems with loss of bowel control? N  Managing your Medications? N  Managing your Finances? N  Housekeeping or managing your Housekeeping? N    Patient Care Team: Plotnikov, Georgina Quint, MD as PCP - General [place], Peter M, MD as Consulting Physician (Cardiology) Maris Berger, MD as Consulting Physician (Ophthalmology) Micki Riley, MD as Consulting Physician (Neurology) Carrington Clamp, MD as Consulting Physician (Obstetrics and Gynecology)  Indicate any recent Medical Services you may have received from other than [facility] providers in the past year (date may be approximate).     Assessment:   This is a routine wellness examination for Vickie Wilson.  Hearing/Vision screen No results found.  Dietary issues and exercise activities discussed: Current Exercise Habits: The patient does not participate in regular exercise at present, Exercise limited by: None identified   Goals  Addressed             This Visit's Progress    Patient Stated       Stay independent for as long as possible.      Depression Screen    08/18/2022   10:21 AM 06/11/2022   11:41 AM 12/29/2021    3:33 PM 12/29/2021    3:28 PM 07/16/2021    4:37 PM 03/20/2020    2:03 PM 12/18/2019    2:35 PM  PHQ 2/9 Scores  PHQ - 2 Score 1 0 2 0 1 0 0  PHQ- 9 Score  0 5 0  0     Fall Risk    08/18/2022   10:21 AM 12/29/2021    3:28 PM 07/16/2021    4:39 PM 12/18/2019    2:35 PM 10/05/2018    4:54 PM  Fall Risk   Falls in the past year? 0 0 0 0 0  Number falls in past yr: 0 0 0 0   Injury with Fall? 0 0 0 0   Risk for fall due to : Impaired balance/gait;Impaired mobility No Fall Risks No Fall Risks  Impaired balance/gait;Impaired mobility  Follow up Falls evaluation completed Falls evaluation completed Falls evaluation completed      FALL RISK PREVENTION PERTAINING TO THE HOME:  Any stairs in or around the home? Yes  If so, are there any without handrails? Yes  Home free of loose throw rugs in walkways, pet beds, electrical cords, etc? Yes  Adequate lighting in your home to reduce risk of falls? Yes   ASSISTIVE DEVICES UTILIZED TO PREVENT FALLS:  Life alert? No  Use of a cane, walker or w/c? No  Grab bars in the bathroom? No  Shower chair or bench in shower? No  Elevated toilet seat or a handicapped toilet? No   Cognitive Function:    10/06/2018    8:11 AM  MMSE - Mini Mental State Exam  Not completed: Refused        08/18/2022   10:34 AM  6CIT Screen  What Year? 0 points  What month? 0 points  What time? 0 points  Count back from 20 0 points  Months in reverse 4 points  Repeat phrase 4 points  Total Score 8 points    Immunizations Immunization History  Administered Date(s) Administered   Fluad Quad(high Dose 65+) 05/18/2019, 09/19/2020   Influenza Split 06/23/2011, 07/19/2012   Influenza Whole 06/21/2008, 07/12/2009, 07/30/2010   Influenza, High Dose Seasonal PF  09/02/2016, 05/27/2017, 06/15/2018   Influenza,inj,Quad PF,6+ Mos 05/23/2013, 06/22/2014, 06/14/2015   PFIZER(Purple Top)SARS-COV-2 Vaccination 10/05/2019, 10/26/2019, 06/18/2020   Pfizer Covid-19 Vaccine Bivalent Booster 81yrs & up 08/25/2021   Pneumococcal Conjugate-13 12/08/2013   Pneumococcal Polysaccharide-23 08/01/2007, 08/11/2017   Td 04/30/2010   Tdap 07/31/2017    TDAP status: Up to date  Flu Vaccine status: Due, Education has been provided regarding the importance of this vaccine. Advised may receive this vaccine at local pharmacy or Health Dept. Aware to provide a copy of the vaccination record if obtained from local pharmacy or Health Dept. Verbalized acceptance and understanding.  Pneumococcal vaccine status: Up to date  Covid-19 vaccine status: Information provided on how to obtain vaccines.   Qualifies for Shingles Vaccine? Yes   Zostavax completed No   Shingrix Completed?: No.    Education has been provided regarding the importance of this vaccine. Patient has been advised to call insurance company to determine out of pocket expense if they have not yet received this vaccine. Advised may also receive vaccine at local pharmacy or Health Dept. Verbalized acceptance and understanding.  Screening Tests Health Maintenance  Topic Date Due   FOOT EXAM  Never done   Zoster Vaccines- Shingrix (1 of 2) Never done   DEXA SCAN  Never done   OPHTHALMOLOGY EXAM  09/24/2019   HEMOGLOBIN A1C  02/20/2020   COVID-19 Vaccine (5 - 2023-24 season) 05/15/2022   INFLUENZA VACCINE  12/13/2022 (Originally 04/14/2022)   Medicare Annual Wellness (AWV)  08/19/2023   DTaP/Tdap/Td (3 - Td or Tdap) 08/01/2027   Pneumonia Vaccine 63+ Years old  Completed   HPV VACCINES  Aged Out    Health Maintenance  Health Maintenance Due  Topic Date Due   FOOT EXAM  Never done   Zoster Vaccines- Shingrix (1 of 2) Never done   DEXA SCAN  Never done   OPHTHALMOLOGY EXAM  09/24/2019   HEMOGLOBIN A1C   02/20/2020   COVID-19 Vaccine (5 - 2023-24 season) 05/15/2022    Colorectal cancer screening: No longer required.   Mammogram status: No longer required due to age.  Lung Cancer Screening: (Low Dose CT Chest recommended if Age 76-80 years, 30 pack-year currently smoking OR have quit w/in 15years.) does not qualify.   Additional Screening:  Hepatitis C Screening: does qualify; Completed education provided  Vision Screening: Recommended annual ophthalmology exams for early detection of glaucoma and other disorders of the eye. Is the patient up to date with their annual eye exam?  Yes  Who is the provider or what is the name of the office in which the patient attends annual eye exams? Dr. Charlotte Sanes If pt is not established with a provider, would they like to be referred to a provider to establish care?  N/A .   Dental Screening: Recommended annual dental exams for proper oral hygiene  Community Resource Referral / Chronic Care Management: CRR required this visit?  No   CCM required this visit?  No      Plan:     I have personally reviewed and noted the following in the patient's chart:  Medical and social history Use of alcohol, tobacco or illicit drugs  Current medications and supplements including opioid prescriptions. Patient is not currently taking opioid prescriptions. Functional ability and status Nutritional status Physical activity Advanced directives List of other physicians Hospitalizations, surgeries, and ER visits in previous 12 months Vitals Screenings to include cognitive, depression, and falls Referrals and appointments  In addition, I have reviewed and discussed with patient certain preventive protocols, quality metrics, and best practice recommendations. A written personalized care plan for preventive services as well as general preventive health recommendations were provided to patient.     Wanda PlumpJill A Keithon Mccoin, RN   08/18/2022   Nurse Notes:  Vickie Wilson  , Thank you for taking time to come for your Medicare Wellness Visit. I appreciate your ongoing commitment to your health goals. Please review the following plan we discussed and let me know if I can assist you in the future.   These are the goals we discussed:  Goals      Patient Stated     Stay independent for as long as possible.        This is a list of the screening recommended for you and due dates:  Health Maintenance  Topic Date Due   Complete foot exam   Never done   Zoster (Shingles) Vaccine (1 of 2) Never done   DEXA scan (bone density measurement)  Never done   Eye exam for diabetics  09/24/2019   Hemoglobin A1C  02/20/2020   COVID-19 Vaccine (5 - 2023-24 season) 05/15/2022   Flu Shot  12/13/2022*   Medicare Annual Wellness Visit  08/19/2023   DTaP/Tdap/Td vaccine (3 - Td or Tdap) 08/01/2027   Pneumonia Vaccine  Completed   HPV Vaccine  Aged Out  *Topic was postponed. The date shown is not the original due date.      Medical screening examination/treatment/procedure(s) were performed by non-physician practitioner and as supervising physician I was immediately available for consultation/collaboration.  I agree with above. Jacinta ShoeAleksei Plotnikov, MD

## 2022-08-18 ENCOUNTER — Ambulatory Visit (INDEPENDENT_AMBULATORY_CARE_PROVIDER_SITE_OTHER): Payer: Medicare Other | Admitting: *Deleted

## 2022-08-18 ENCOUNTER — Other Ambulatory Visit: Payer: Self-pay | Admitting: *Deleted

## 2022-08-18 DIAGNOSIS — Z0000 Encounter for general adult medical examination without abnormal findings: Secondary | ICD-10-CM

## 2022-08-18 MED ORDER — FLUTICASONE PROPIONATE 50 MCG/ACT NA SUSP: 2.0000 | Freq: Every day | NASAL | 1 refills | Status: DC

## 2022-08-20 ENCOUNTER — Encounter: Payer: Self-pay | Admitting: Internal Medicine

## 2022-08-20 ENCOUNTER — Ambulatory Visit (INDEPENDENT_AMBULATORY_CARE_PROVIDER_SITE_OTHER): Payer: Medicare Other | Admitting: Internal Medicine

## 2022-08-20 VITALS — BP 118/72 | HR 60 | Temp 98.3°F | Ht 61.0 in | Wt 100.0 lb

## 2022-08-20 DIAGNOSIS — J189 Pneumonia, unspecified organism: Secondary | ICD-10-CM | POA: Diagnosis not present

## 2022-08-20 DIAGNOSIS — J302 Other seasonal allergic rhinitis: Secondary | ICD-10-CM | POA: Diagnosis not present

## 2022-08-20 DIAGNOSIS — R634 Abnormal weight loss: Secondary | ICD-10-CM | POA: Diagnosis not present

## 2022-08-20 MED ORDER — FLUTICASONE PROPIONATE 50 MCG/ACT NA SUSP: 2.0000 | Freq: Every day | NASAL | 1 refills | Status: DC

## 2022-08-20 NOTE — Assessment & Plan Note (Signed)
Doing well CXR in 1-3 months

## 2022-08-20 NOTE — Assessment & Plan Note (Signed)
Re-start Flonase QD - we demonstrated how to

## 2022-08-20 NOTE — Progress Notes (Signed)
Subjective:  Patient ID: Vickie Wilson, female    DOB: 10-17-1930  Age: 88 y.o. MRN: [id number]  CC: Follow-up   HPI Vickie Wilson presents for CAP C/o wt loss C/o runny nose   Outpatient Medications Prior to Visit  Medication Sig Dispense Refill   acetaminophen (TYLENOL 8 HOUR) 650 MG CR tablet Take 1 tablet (650 mg total) by mouth every 8 (eight) hours as needed for pain. 100 tablet 3   amLODipine (NORVASC) 5 MG tablet Take 1 tablet (5 mg total) by mouth daily. 90 tablet 3   atenolol (TENORMIN) 100 MG tablet TAKE (1/2) TABLET TWICE DAILY. 45 tablet 3   Cholecalciferol (VITAMIN D3) 50 MCG (2000 UT) capsule Take 1 capsule (2,000 Units total) by mouth daily. 100 capsule 3   levETIRAcetam (KEPPRA) 750 MG tablet TAKE ONE TABLET BY MOUTH TWICE DAILY 180 tablet 3   Multiple Vitamins-Minerals (CENTRUM SILVER) CHEW Chew 1 each by mouth 3 (three) times a week.     PARoxetine (PAXIL) 20 MG tablet Take 1 tablet (20 mg total) by mouth daily. 90 tablet 1   promethazine-dextromethorphan (PROMETHAZINE-DM) 6.25-15 MG/5ML syrup Take 5 mLs by mouth 4 (four) times daily as needed for cough. 240 mL 0   rosuvastatin (CRESTOR) 20 MG tablet take 1/2 tablet ( ) by mouth Monday, Wednesday and Friday of each week.} 19 tablet 1   fluticasone (FLONASE) 50 MCG/ACT nasal spray Place 2 sprays into both nostrils daily. 48 g 1   loratadine (CLARITIN) 10 MG tablet Take 1 tablet (10 mg total) by mouth daily. (Patient not taking: Reported on 08/20/2022) 30 tablet 3   LORazepam (ATIVAN) 0.5 MG tablet Take 1 tablet (0.5 mg total) by mouth 2 (two) times daily as needed for anxiety. (Patient not taking: Reported on 08/18/2022) 60 tablet 1   No facility-administered medications prior to visit.    ROS: Review of Systems  Constitutional:  Positive for unexpected weight change. Negative for activity change, appetite change, chills and fatigue.  HENT:  Positive for congestion, postnasal drip and rhinorrhea. Negative for  mouth sores and sinus pressure.   Eyes:  Negative for visual disturbance.  Respiratory:  Negative for cough, chest tightness, shortness of breath and wheezing.   Cardiovascular:  Negative for chest pain.  Gastrointestinal:  Negative for abdominal pain, constipation and nausea.  Genitourinary:  Negative for difficulty urinating, frequency and vaginal pain.  Musculoskeletal:  Negative for back pain and gait problem.  Skin:  Negative for pallor and rash.  Neurological:  Negative for dizziness, tremors, weakness, numbness and headaches.  Psychiatric/Behavioral:  Positive for dysphoric mood. Negative for confusion, sleep disturbance and suicidal ideas. The patient is nervous/anxious.     Objective:  BP 118/72 (BP Location: Left Arm, Patient Position: Sitting, Cuff Size: Normal)   Pulse 60   Temp 98.3 F (36.8 C) (Oral)   Ht  (1.549 m)   Wt 100 lb (45.4 kg)   SpO2 96%   BMI 18.89 kg/m   BP Readings from Last 3 Encounters:  08/20/22 118/72  07/23/22 118/82  06/11/22 130/82    Wt Readings from Last 3 Encounters:  08/20/22 100 lb (45.4 kg)  07/23/22 102 lb (46.3 kg)  06/11/22 105 lb 9.6 oz (47.9 kg)    Physical Exam Constitutional:      General: She is not in acute distress.    Appearance: Normal appearance. She is well-developed.  HENT:     Head: Normocephalic.     Right Ear: External  ear normal.     Left Ear: External ear normal.     Nose: Congestion present.  Eyes:     General:        Right eye: No discharge.        Left eye: No discharge.     Conjunctiva/sclera: Conjunctivae normal.     Pupils: Pupils are equal, round, and reactive to light.  Neck:     Thyroid: No thyromegaly.     Vascular: No JVD.     Trachea: No tracheal deviation.  Cardiovascular:     Rate and Rhythm: Normal rate and regular rhythm.     Heart sounds: Normal heart sounds.  Pulmonary:     Effort: No respiratory distress.     Breath sounds: No stridor. No wheezing.  Abdominal:     General:  Bowel sounds are normal. There is no distension.     Palpations: Abdomen is soft. There is no mass.     Tenderness: There is no abdominal tenderness. There is no guarding or rebound.  Musculoskeletal:        General: No tenderness.     Cervical back: Normal range of motion and neck supple. No rigidity.  Lymphadenopathy:     Cervical: No cervical adenopathy.  Skin:    Findings: No erythema or rash.  Neurological:     Cranial Nerves: No cranial nerve deficit.     Motor: No abnormal muscle tone.     Coordination: Coordination normal.     Deep Tendon Reflexes: Reflexes normal.  Psychiatric:        Behavior: Behavior normal.        Thought Content: Thought content normal.        Judgment: Judgment normal.     Lab Results  Component Value Date   WBC 6.9 12/29/2021   HGB 12.5 12/29/2021   HCT 36.9 12/29/2021   PLT 313.0 12/29/2021   GLUCOSE 118 (H) 12/29/2021   CHOL 279 (H) 12/29/2021   TRIG 174.0 (H) 12/29/2021   HDL 56.50 12/29/2021   LDLDIRECT 125.6 01/16/2013   LDLCALC 188 (H) 12/29/2021   ALT 12 12/29/2021   AST 23 12/29/2021   NA 133 (L) 12/29/2021   K 4.1 12/29/2021   CL 98 12/29/2021   CREATININE 1.08 12/29/2021   BUN 16 12/29/2021   CO2 28 12/29/2021   TSH 2.74 12/29/2021   INR 0.92 04/22/2018   HGBA1C 6.1 08/22/2019   MICROALBUR 2.8 (H) 09/17/2016    CT ANGIO NECK W OR WO CONTRAST  Result Date: 04/23/2018 CLINICAL DATA:  Subarachnoid hemorrhage, proven, follow-up. EXAM: CT ANGIOGRAPHY HEAD AND NECK TECHNIQUE: Multidetector CT imaging of the head and neck was performed using the standard protocol during bolus administration of intravenous contrast. Multiplanar CT image reconstructions and MIPs were obtained to evaluate the vascular anatomy. Carotid stenosis measurements (when applicable) are obtained utilizing NASCET criteria, using the distal internal carotid diameter as the denominator. CONTRAST:  50mL ISOVUE-370 IOPAMIDOL (ISOVUE-370) INJECTION 76% COMPARISON:   The of conservatively or FINDINGS: CT HEAD FINDINGS Brain: Subarachnoid hemorrhage within the right middle frontal sulcus is stable. No new areas of hemorrhage are present. Atrophy and white matter disease is noted bilaterally. No mass lesion is present. No significant cortical infarct is present. Vascular: Atherosclerotic calcifications are present within the cavernous internal carotid arteries. There is no hyperdense vessel. Skull: Calvarium is intact. No focal lytic or blastic lesions are present. Asymmetric degenerative changes are present at the right TMJ. Sinuses: The paranasal sinuses and  mastoid air cells are clear. Orbits: Left lens replacement is present. Globes and orbits are otherwise within normal limits. Review of the MIP images confirms the above findings CTA NECK FINDINGS Aortic arch: A 3 vessel arch configuration is present. No significant stenosis or aneurysm is present. Right carotid system: The right common carotid artery is within normal limits. Dense atherosclerotic calcifications are present at the bifurcation without a significant stenosis relative to the more distal vessel. Left carotid system: The left common carotid artery is within normal limits. Atherosclerotic calcifications are present at the bifurcation without a significant luminal stenosis relative to the more distal vessel. Vertebral arteries: The vertebral arteries are codominant. Both vertebral arteries originate from the subclavian arteries without a significant stenosis. There is some tortuosity of the V1 segments bilaterally. No significant stenosis or vessel injury is present to either vertebral artery in the neck. Skeleton: Degenerative anterolisthesis is present at C2-3, C3-4, and C4-5. There is chronic loss of disc height with endplate sclerosis at C5-6 and C6-7. Marked lucency surrounds the roots of the residual right maxillary molar. Dental caries are present without periapical disease. Other neck: The soft tissues the  neck are otherwise unremarkable. No significant adenopathy is present. No mucosal lesions are evident. Salivary glands are within normal limits bilaterally. Thyroid is normal. Upper chest: Centrilobular emphysematous changes are present bilaterally. No focal nodule or mass lesion is present. Review of the MIP images confirms the above findings CTA HEAD FINDINGS Anterior circulation: Atherosclerotic calcifications are present within the cavernous internal carotid arteries bilaterally. There is no hyperdense vessel. 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 No definite posterior communicating artery is present. The A1 and M1 segments are normal. The anterior communicating artery is patent. MCA bifurcations are within normal limits bilaterally. The ACA and MCA branch vessels are normal. No focal vascular lesion is evident at the location of the subarachnoid hemorrhage over the right frontal convexity. Posterior circulation: Vertebral arteries are within normal limits bilaterally. PICA origins are visualized and normal. The basilar artery is normal. Both posterior cerebral arteries originate from the basilar tip. PCA branch vessels are within normal limits bilaterally. Venous sinuses: The dural sinuses are patent. Straight sinus and deep cerebral veins are intact. Cortical veins are normal. Anatomic variants: None Delayed phase: Delayed images demonstrate no pathologic enhancement. White matter disease is accentuated. Review of the MIP images confirms the above findings IMPRESSION: 1. No acute or focal vascular lesion to explain subarachnoid hemorrhage over the convexity of the right frontal lobe. 2. No mass lesion. 3. Stable atrophy and white matter disease. 4. 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 Recommend follow-up CTA of the head in 1 year to assess stability. This aneurysm does not correspond with the area of subarachnoid hemorrhage.  Electronically Signed   By: Marin Roberts M.D.   On: 04/23/2018 19:28   CT ANGIO HEAD W OR WO CONTRAST  Result Date: 04/23/2018 CLINICAL DATA:  Subarachnoid hemorrhage, proven, follow-up. EXAM: CT ANGIOGRAPHY HEAD AND NECK TECHNIQUE: Multidetector CT imaging of the head and neck was performed using the standard protocol during bolus administration of intravenous contrast. Multiplanar CT image reconstructions and MIPs were obtained to evaluate the vascular anatomy. Carotid stenosis measurements (when applicable) are obtained utilizing NASCET criteria, using the distal internal carotid diameter as the denominator. CONTRAST:  50mL ISOVUE-370 IOPAMIDOL (ISOVUE-370) INJECTION 76% COMPARISON:  The of conservatively or FINDINGS: CT HEAD FINDINGS Brain: Subarachnoid hemorrhage within the right  middle frontal sulcus is stable. No new areas of hemorrhage are present. Atrophy and white matter disease is noted bilaterally. No mass lesion is present. No significant cortical infarct is present. Vascular: Atherosclerotic calcifications are present within the cavernous internal carotid arteries. There is no hyperdense vessel. Skull: Calvarium is intact. No focal lytic or blastic lesions are present. Asymmetric degenerative changes are present at the right TMJ. Sinuses: The paranasal sinuses and mastoid air cells are clear. Orbits: Left lens replacement is present. Globes and orbits are otherwise within normal limits. Review of the MIP images confirms the above findings CTA NECK FINDINGS Aortic arch: A 3 vessel arch configuration is present. No significant stenosis or aneurysm is present. Right carotid system: The right common carotid artery is within normal limits. Dense atherosclerotic calcifications are present at the bifurcation without a significant stenosis relative to the more distal vessel. Left carotid system: The left common carotid artery is within normal limits. Atherosclerotic calcifications are present at  the bifurcation without a significant luminal stenosis relative to the more distal vessel. Vertebral arteries: The vertebral arteries are codominant. Both vertebral arteries originate from the subclavian arteries without a significant stenosis. There is some tortuosity of the V1 segments bilaterally. No significant stenosis or vessel injury is present to either vertebral artery in the neck. Skeleton: Degenerative anterolisthesis is present at C2-3, C3-4, and C4-5. There is chronic loss of disc height with endplate sclerosis at C5-6 and C6-7. Marked lucency surrounds the roots of the residual right maxillary molar. Dental caries are present without periapical disease. Other neck: The soft tissues the neck are otherwise unremarkable. No significant adenopathy is present. No mucosal lesions are evident. Salivary glands are within normal limits bilaterally. Thyroid is normal. Upper chest: Centrilobular emphysematous changes are present bilaterally. No focal nodule or mass lesion is present. Review of the MIP images confirms the above findings CTA HEAD FINDINGS Anterior circulation: Atherosclerotic calcifications are present within the cavernous internal carotid arteries bilaterally. There is no hyperdense vessel. 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 No definite posterior communicating artery is present. The A1 and M1 segments are normal. The anterior communicating artery is patent. MCA bifurcations are within normal limits bilaterally. The ACA and MCA branch vessels are normal. No focal vascular lesion is evident at the location of the subarachnoid hemorrhage over the right frontal convexity. Posterior circulation: Vertebral arteries are within normal limits bilaterally. PICA origins are visualized and normal. The basilar artery is normal. Both posterior cerebral arteries originate from the basilar tip. PCA branch vessels are within normal limits bilaterally. Venous sinuses:  The dural sinuses are patent. Straight sinus and deep cerebral veins are intact. Cortical veins are normal. Anatomic variants: None Delayed phase: Delayed images demonstrate no pathologic enhancement. White matter disease is accentuated. Review of the MIP images confirms the above findings IMPRESSION: 1. No acute or focal vascular lesion to explain subarachnoid hemorrhage over the convexity of the right frontal lobe. 2. No mass lesion. 3. Stable atrophy and white matter disease. 4. 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 Recommend follow-up CTA of the head in 1 year to assess stability. This aneurysm does not correspond with the area of subarachnoid hemorrhage. Electronically Signed   By: Marin Roberts M.D.   On: 04/23/2018 19:28   ECHOCARDIOGRAM COMPLETE  Result Date: 04/23/2018                            *  [facility]*                   *[facility]*                         [address]                        [place], [place] [address]                            [phone] ------------------------------------------------------------------- Transthoracic Echocardiography Patient:    Vickie Wilson, Vickie Wilson MR #:       [id number] Study Date: 04/23/2018 Gender:     F Age:        87 Height:     154.9 cm Weight:     50.6 kg BSA:        1.48 m^2 Pt. Status: Room:       3W19C  ATTENDING    Samuel Jester [id number]  PERFORMING   [facility], Inpatient  ADMITTING    Emokpae, Courage  ORDERING     Emokpae, Courage  REFERRING    Emokpae, Courage  SONOGRAPHER  Belva Chimes cc: ------------------------------------------------------------------- LV EF: 55% -   60% ------------------------------------------------------------------- Indications:      TIA 435.9. ------------------------------------------------------------------- Study Conclusions - Left ventricle: The cavity size was normal. Wall thickness was   normal. Systolic function was normal. The estimated ejection    fraction was in the range of 55% to 60%. Wall motion was normal;   there were no regional wall motion abnormalities. Doppler   parameters are consistent with abnormal left ventricular   relaxation (grade 1 diastolic dysfunction). - Aortic valve: Valve area (VTI): 2.42 cm^2. Valve area (Vmax):   2.38 cm^2. Valve area (Vmean): 2.23 cm^2. ------------------------------------------------------------------- Study data:  Comparison was made to the study of 03/02/2018.  Study status:  Routine.  Procedure:  Transthoracic echocardiography. Image quality was good.  Study completion:  There were no complications.          Transthoracic echocardiography.  2D, spectral Doppler, and color Doppler.  Birthdate:  Patient birthdate: [birth year].  Age:  Patient is 88 yr old.  Sex:  Gender: female.    BMI: 21.1 kg/m^2.  Blood pressure:     131/72  Patient status:  Inpatient.  Study date:  Study date: 04/23/2018. Study time: 02:16 PM.  Location:  Bedside. ------------------------------------------------------------------- ------------------------------------------------------------------- Left ventricle:  The cavity size was normal. Wall thickness was normal. Systolic function was normal. The estimated ejection fraction was in the range of 55% to 60%. Wall motion was normal; there were no regional wall motion abnormalities. Doppler parameters are consistent with abnormal left ventricular relaxation (grade 1 diastolic dysfunction). ------------------------------------------------------------------- Aortic valve:   Mildly calcified leaflets. Cusp separation was normal.  Doppler:  Transvalvular velocity was within the normal range. There was no stenosis. There was no regurgitation.    VTI ratio of LVOT to aortic valve: 0.77. Valve area (VTI): 2.42 cm^2. Indexed valve area (VTI): 1.64 cm^2/m^2. Peak velocity ratio of LVOT to aortic valve: 0.76. Valve area (Vmax): 2.38 cm^2. Indexed valve area (Vmax): 1.61 cm^2/m^2. Mean velocity ratio of  LVOT to aortic valve: 0.71. Valve area (Vmean): 2.23 cm^2. Indexed valve area (Vmean): 1.51 cm^2/m^2.    Mean gradient (S): 3 mm Hg. Peak gradient (S): 5 mm Hg. ------------------------------------------------------------------- Aorta:  Aortic root: The aortic root was normal in  size. Ascending aorta: The ascending aorta was normal in size. ------------------------------------------------------------------- Mitral valve:   Structurally normal valve.   Leaflet separation was normal.  Doppler:  Transvalvular velocity was within the normal range. There was no evidence for stenosis. There was no regurgitation. ------------------------------------------------------------------- Left atrium:  The atrium was normal in size. ------------------------------------------------------------------- Right ventricle:  The cavity size was normal. Wall thickness was normal. Systolic function was normal. ------------------------------------------------------------------- Pulmonic valve:   Poorly visualized.  Doppler:  There was no significant regurgitation. ------------------------------------------------------------------- Tricuspid valve:   Structurally normal valve.   Leaflet separation was normal.  Doppler:  Transvalvular velocity was within the normal range. There was mild regurgitation. ------------------------------------------------------------------- Right atrium:  The atrium was normal in size. ------------------------------------------------------------------- Pericardium:  There was no pericardial effusion. ------------------------------------------------------------------- Systemic veins: Inferior vena cava: The vessel was normal in size. The respirophasic diameter changes were in the normal range (>= 50%), consistent with normal central venous pressure. ------------------------------------------------------------------- Measurements  Left ventricle                           Value          Reference  LV ID, ED, PLAX  chordal          (L)     42.7  mm       43 - 52  LV ID, ES, PLAX chordal                  29.4  mm       23 - 38  LV fx shortening, PLAX chordal           31    %        >=29  LV PW thickness, ED                      4.55  mm       ----------  IVS/LV PW ratio, ED              (H)     1.58           <=1.3  Stroke volume, 2D                        65    ml       ----------  Stroke volume/bsa, 2D                    44    ml/m^2   ----------  LV e&', lateral                           5.87  cm/s     ----------  LV E/e&', lateral                         10.75          ----------  LV s&', lateral                           6.42  cm/s     ----------  LV e&', medial                            4.24  cm/s     ----------  LV E/e&', medial  14.88          ----------  LV e&', average                           5.06  cm/s     ----------  LV E/e&', average                         12.48          ----------   Ventricular septum                       Value          Reference  IVS thickness, ED                        7.2   mm       ----------   LVOT                                     Value          Reference  LVOT ID, S                               20    mm       ----------  LVOT area                                3.14  cm^2     ----------  LVOT peak velocity, S                    87.9  cm/s     ----------  LVOT mean velocity, S                    58.4  cm/s     ----------  LVOT VTI, S                              20.7  cm       ----------   Aortic valve                             Value          Reference  Aortic valve peak velocity, S            116   cm/s     ----------  Aortic valve mean velocity, S            82.4  cm/s     ----------  Aortic valve VTI, S                      26.9  cm       ----------  Aortic mean gradient, S                  3     mm Hg    ----------  Aortic peak gradient, S                  5     mm Hg    ----------  VTI ratio, LVOT/AV                       0.77           ----------   Aortic valve area, VTI                   2.42  cm^2     ----------  Aortic valve area/bsa, VTI               1.64  cm^2/m^2 ----------  Velocity ratio, peak, LVOT/AV            0.76           ----------  Aortic valve area, peak velocity         2.38  cm^2     ----------  Aortic valve area/bsa, peak              1.61  cm^2/m^2 ----------  velocity  Velocity ratio, mean, LVOT/AV            0.71           ----------  Aortic valve area, mean velocity         2.23  cm^2     ----------  Aortic valve area/bsa, mean              1.51  cm^2/m^2 ----------  velocity   Aorta                                    Value          Reference  Aortic root ID, ED                       27    mm       ----------   Left atrium                              Value          Reference  LA ID, A-P, ES                           25    mm       ----------  LA ID/bsa, A-P                           1.69  cm/m^2   <=2.2  LA volume, S                             38.7  ml       ----------  LA volume/bsa, S                         26.2  ml/m^2   ----------  LA volume, ES, 1-p A4C                   31.7  ml       ----------  LA volume/bsa, ES, 1-p A4C               21.5  ml/m^2   ----------  LA volume, ES, 1-p A2C  45.7  ml       ----------  LA volume/bsa, ES, 1-p A2C               30.9  ml/m^2   ----------   Mitral valve                             Value          Reference  Mitral E-wave peak velocity              63.1  cm/s     ----------  Mitral A-wave peak velocity              95.1  cm/s     ----------  Mitral deceleration time         (H)     363   ms       150 - 230  Mitral E/A ratio, peak                   0.7            ----------   Pulmonary veins                          Value          Reference  Pulmonary vein peak velocity, S          50    cm/s     ----------  Pulmonary vein peak velocity, D          28    cm/s     ----------  Pulmonary vein velocity ratio,           1.8            ----------  peak, S/D   Pulmonary arteries                        Value          Reference  PA pressure, S, DP                       27    mm Hg    <=30   Tricuspid valve                          Value          Reference  Tricuspid regurg peak velocity           247   cm/s     ----------  Tricuspid peak RV-RA gradient            24    mm Hg    ----------   Right atrium                             Value          Reference  RA ID, S-I, ES, A4C              (H)     49.8  mm       34 - 49  RA area, ES, A4C                         13.2  cm^2     8.3 -  19.5  RA volume, ES, A/L                       29    ml       ----------  RA volume/bsa, ES, A/L                   19.6  ml/m^2   ----------   Systemic veins                           Value          Reference  Estimated CVP                            3     mm Hg    ----------   Right ventricle                          Value          Reference  RV ID, ED, PLAX                          33.1  mm       19 - 38  TAPSE                                    19.2  mm       ----------  RV pressure, S, DP                       27    mm Hg    <=30  RV s&', lateral, S                        10.4  cm/s     ----------   Pulmonic valve                           Value          Reference  Pulmonic valve peak velocity, S          84.2  cm/s     ---------- Legend: (L)  and  (H)  mark values outside specified reference range. ------------------------------------------------------------------- Prepared and Electronically Authenticated by Nicholes Mango, MD 2019-08-10T15:35:38  EEG adult  Result Date: 04/23/2018 Rejeana Brock, MD     04/23/2018  1:34 PM History: 88 yo F with intermittent numbness Sedation: None Technique: This is a 21 channel routine scalp EEG performed at the bedside with bipolar and monopolar montages arranged in accordance to the international 10/20 system of electrode placement. One channel was dedicated to EKG recording. Background: The background consists of intermixed alpha and beta activities. There is a  well defined posterior dominant rhythm of 9-10 Hz that attenuates with eye opening. Sleep is not recorded. Photic stimulation: Physiologic driving is not performed EEG Abnormalities: None Clinical Interpretation: This normal EEG is recorded in the waking state. There was no seizure or seizure predisposition recorded on this study. Please note that a normal EEG does not preclude the possibility of epilepsy. Ritta Slot, MD [facility] [phone] If 7pm- 7am, please page neurology on call as listed in [facility].  MR Brain Wo Contrast (neuro protocol)  Result Date: 04/22/2018 CLINICAL DATA:  Numbness or tingling, paresthesia. Intermittent left arm numbness for 2 weeks EXAM: MRI HEAD WITHOUT CONTRAST TECHNIQUE: Multiplanar, multiecho pulse sequences of the brain and surrounding structures were obtained without intravenous contrast. COMPARISON:  CT from earlier today FINDINGS: Brain: Small focus of subarachnoid FLAIR hyperintensity and gradient hypointensity along the precentral gyrus on the right, this area is high-density by CT. No other site of hemorrhage seen, either acute or chronic. There is mild for age chronic small vessel ischemia in the cerebral white matter. Mild cerebral volume loss. Clustered appearance of gyri at the left vertex, nonspecific and likely noncontributory. No underlying mass is seen and there is no extrinsic mass effect by collection. No hydrocephalus. Vascular: Major flow voids are preserved Skull and upper cervical spine: No evidence of marrow lesion. Diffuse cervical facet spurring with mild C3-4 and C4-5 anterolisthesis. Sinuses/Orbits: Left cataract resection. Other: These results were called by telephone at the time of interpretation on 04/22/2018 at 2:10 pm to Dr. Samuel Jester , who verbally acknowledged these results. IMPRESSION: 1. Small volume subarachnoid hemorrhage along the high right frontal convexity. Trauma, coagulopathy, or vasculopathy/amyloid are  primary considerations in this location. There is normal superior sagittal sinus flow voids. No acute or remote hemorrhage seen elsewhere. 2. Mild atrophy and chronic small vessel ischemia for age. Electronically Signed   By: Marnee Spring M.D.   On: 04/22/2018 14:14   CT HEAD WO CONTRAST  Result Date: 04/22/2018 CLINICAL DATA:  Left arm numbness. EXAM: CT HEAD WITHOUT CONTRAST CT CERVICAL SPINE WITHOUT CONTRAST TECHNIQUE: Multidetector CT imaging of the head and cervical spine was performed following the standard protocol without intravenous contrast. Multiplanar CT image reconstructions of the cervical spine were also generated. COMPARISON:  None. FINDINGS: CT HEAD FINDINGS Brain: Mild diffuse cortical atrophy is noted. Mild chronic ischemic white matter disease is noted. No mass effect or midline shift is noted. Ventricular size is within normal limits. There is no evidence of mass lesion, hemorrhage or acute infarction. Vascular: No hyperdense vessel or unexpected calcification. Skull: Normal. Negative for fracture or focal lesion. Sinuses/Orbits: No acute finding. Other: None. CT CERVICAL SPINE FINDINGS Alignment: Grade 1 anterolisthesis of C3-4 C4-5 is noted secondary to posterior facet joint hypertrophy. Skull base and vertebrae: No acute fracture. No primary bone lesion or focal pathologic process. Soft tissues and spinal canal: No prevertebral fluid or swelling. No visible canal hematoma. Disc levels: Severe degenerative disc disease is noted at C5-6 and C6-7 with anterior osteophyte formation. Mild degenerative disc disease is noted at C4-5 and C7-T1. Upper chest: Negative. Other: Degenerative changes seen involving posterior facet joints bilaterally, right greater than left. IMPRESSION: Mild diffuse cortical atrophy. Mild chronic ischemic white matter disease. No acute intracranial abnormality seen. Multilevel degenerative disc disease is noted in the cervical spine. No fracture or other acute  abnormality is noted. Electronically Signed   By: Lupita Raider, M.D.   On: 04/22/2018 11:09   CT Cervical Spine Wo Contrast  Result Date: 04/22/2018 CLINICAL DATA:  Left arm numbness. EXAM: CT HEAD WITHOUT CONTRAST CT CERVICAL SPINE WITHOUT CONTRAST TECHNIQUE: Multidetector CT imaging of the head and cervical spine was performed following the standard protocol without intravenous contrast. Multiplanar CT image reconstructions of the cervical spine were also generated. COMPARISON:  None. FINDINGS: CT HEAD FINDINGS Brain: Mild diffuse cortical atrophy is noted. Mild chronic ischemic white matter disease is noted. No mass effect or midline shift is  noted. Ventricular size is within normal limits. There is no evidence of mass lesion, hemorrhage or acute infarction. Vascular: No hyperdense vessel or unexpected calcification. Skull: Normal. Negative for fracture or focal lesion. Sinuses/Orbits: No acute finding. Other: None. CT CERVICAL SPINE FINDINGS Alignment: Grade 1 anterolisthesis of C3-4 C4-5 is noted secondary to posterior facet joint hypertrophy. Skull base and vertebrae: No acute fracture. No primary bone lesion or focal pathologic process. Soft tissues and spinal canal: No prevertebral fluid or swelling. No visible canal hematoma. Disc levels: Severe degenerative disc disease is noted at C5-6 and C6-7 with anterior osteophyte formation. Mild degenerative disc disease is noted at C4-5 and C7-T1. Upper chest: Negative. Other: Degenerative changes seen involving posterior facet joints bilaterally, right greater than left. IMPRESSION: Mild diffuse cortical atrophy. Mild chronic ischemic white matter disease. No acute intracranial abnormality seen. Multilevel degenerative disc disease is noted in the cervical spine. No fracture or other acute abnormality is noted. Electronically Signed   By: Lupita Raider, M.D.   On: 04/22/2018 11:09   DG Chest 2 View  Result Date: 04/22/2018 CLINICAL DATA:  Intermittent  LEFT arm numbness for 2 weeks, history hypertension, diabetes mellitus, anxiety, former smoker EXAM: CHEST - 2 VIEW COMPARISON:  02/12/2018 FINDINGS: Normal heart size, mediastinal contours, and pulmonary vascularity. Atherosclerotic calcification aorta. Lungs hyperinflated with minimal subsegmental atelectasis at LEFT costophrenic angle. Remaining lungs clear. No acute infiltrate, pleural effusion or pneumothorax. Bones appear demineralized with old healed fracture of the posterolateral RIGHT sixth rib. IMPRESSION: Hyperinflated lungs with minimal subsegmental atelectasis at LEFT costophrenic angle. Electronically Signed   By: Ulyses Southward M.D.   On: 04/22/2018 11:07    Assessment & Plan:   Problem List Items Addressed This Visit     Allergic rhinitis    Re-start Flonase QD - we demonstrated how to      CAP (community acquired pneumonia)    Doing well CXR in 1-3 months      Relevant Medications   fluticasone (FLONASE) 50 MCG/ACT nasal spray   Other Relevant Orders   CBC with Differential/Platelet   Weight loss - Primary    Post-CAP wt loss. Eat better. Monitor wt      Relevant Orders   TSH   CBC with Differential/Platelet   Comprehensive metabolic panel      Meds ordered this encounter  Medications   fluticasone (FLONASE) 50 MCG/ACT nasal spray    Sig: Place 2 sprays into both nostrils daily.    Dispense:  48 g    Refill:  1      Follow-up: Return in about 2 months (around 10/21/2022) for a follow-up visit.  Sonda Primes, MD

## 2022-08-20 NOTE — Assessment & Plan Note (Signed)
Post-CAP wt loss. Eat better. Monitor wt

## 2022-08-31 ENCOUNTER — Telehealth: Payer: Self-pay | Admitting: Internal Medicine

## 2022-08-31 DIAGNOSIS — F411 Generalized anxiety disorder: Secondary | ICD-10-CM

## 2022-08-31 NOTE — Telephone Encounter (Signed)
Patient wants a phone call but does not want to say why.  Patient # [phone]

## 2022-08-31 NOTE — Telephone Encounter (Signed)
Pt call bck she states would like a referral for a therapist. Looked back in referrals pt was referred at the beginning of the year but she denied the service, but he states she think she need to talk to someone.Vickie KitchenRaechel Chute

## 2022-08-31 NOTE — Telephone Encounter (Signed)
Called pt no answer LMOM RTC.../lmb 

## 2022-09-01 NOTE — Telephone Encounter (Signed)
Inform pt MD put the referral in for therapist. Will received a call from [facility] to make appt. Pt is wanting me to update her home # [phone]. Entered new #../lmb

## 2022-09-01 NOTE — Telephone Encounter (Signed)
Okay.  Thanks.

## 2022-10-01 ENCOUNTER — Ambulatory Visit: Payer: Medicare Other | Admitting: Psychologist

## 2022-10-05 ENCOUNTER — Telehealth: Payer: Self-pay | Admitting: Internal Medicine

## 2022-10-05 NOTE — Telephone Encounter (Signed)
Pt states that she is still dealing with sinus issues after her last visit on 12.8.23, still having some congestion and post nasal drip.   Pt has finished the medication fluticasone (FLONASE) 50 MCG/ACT nasal spray  from December and is asking if we can send in a refill or another medication that may work better.   Please send to:   [facility]   Phone: [phone]  Fax: [phone]

## 2022-10-06 NOTE — Progress Notes (Unsigned)
[facility] [address] [place]. [place] [address] [phone]       OFFICE FOLLOW UP NOTE  [name] Date of Birth:  Sep 14, 1931 Medical Record Number:  [id number]   Referring MD:  Albertine Grates  Reason for Referral:  seizure   No chief complaint on file.    HPI  Update 10/07/2022 [name] returns for yearly seizure follow-up.  Stable, denies any seizure activity.  Keppra 750mg  BID, tolerating well.  Maintain ADLs and IADLs independently.      History provided for reference purposes only Update 10/07/2021 JM: Returns for 87-month seizure follow-up unaccompanied. Doing well on keppra 750mg  twice daily - denies side effects. No seizure activity. Tried to decrease dose but did not feel well (felt shaking and unwell) therefore increased dose back with resolution of symptoms. Lives alone.  Maintains maintains ADLs and IADLs independently. Reports frustration due to limited help from daughter. She feels isolated and depressed. Denies SI/HI. Reports her daughter was supposed to bring her today but due to work, unable to bring her - she drove herself and had difficulty finding office - her car broke down in near by parking lot and walked over to office. Is fearful she will end up in a nursing home as at times she will have difficulty maintaining all IADLs. No further concerns at this time.   Update 03/27/2021 JM: Ms. [name] returns for yearly seizure follow-up.  Stable since prior visit without recurrent seizure activity.  Remains on Keppra without side effects.  She does question potentially lowering dosage as she has not had any seizure activity over the past 3 years.  She remains active maintaining all ADLs and IADLs independently.  No further concerns at this time.  Update 03/27/2020 JM: Ms. [name] returns for seizure follow-up.  She has been doing well since prior visit without recurrent seizure activity or events.  Remains on Keppra 750 mg twice daily tolerating  dosage without side effects.  Currently following with PCP for lower extremity pains and fatigue with Crestor currently on hold for possible medication side effect.  She does have follow-up in the near future.  Follows with cardiology regularly.  Blood pressure stable at 130/70.  No concerns at this time.  Update 03/27/2019 Dr. [name]: She returns for follow-up after last visit in December 2019 with [name], nurse practitioner.  She is doing well and has not had any recurrent episodes of sensory seizures or any other neurological issues.  She remains on Keppra 750 mg twice daily which she is tolerating well without dizziness or other side effects.  She woke up 1 day about a month ago with feeling of coldness and heaviness in both her feet but this does not last long and cleared in an hour or 2.  She did not seek medical help.  She saw her primary care physician few weeks ago who did some lab work all of which was satisfactory.  She states her blood pressure usually runs good but today it is slightly elevated in our office and 155/80 she feels this may be due to white coat hypertension.  She has no new complaints today.  Interval history 08/30/2018 [name] is being seen today for 87-month follow-up visit.  She did undergo repeat EEG which was normal without evidence of seizure activity.  She did increase keppra after prior appointment.  Approximately 3 to 4 weeks ago, patient did experience episode of left hand numbness which lasted approximately 15 seconds and then resolved.  She denies any additional episode occurring.  She does endorse fatigue during the day and tiring quicker than before but she is unsure if this is due to medications versus old age.  She does experience some unsteadiness at times but was also experiencing this prior to her [facility] and being on keppra.  She did undergo a couple physical therapy sessions but financially was unable to continue.  She did receive handout regarding exercises to do at  home to assist with balance but she does admit to not doing this.  She does state that she consistently stays busy as she does live independently and manages all ADLs and IADLs.  She is able to continue to ambulate without assistive device and denies any recent falls.  She also states that intermittently she will have a burning sensation/sharp pinprick sensation on her left middle finger knuckle and left AC area which may only last for 1 to 2 minutes and then subside.  She believes that this pain could be due to arthritis and is not overly concerned as it happens infrequently.  Pressure today 141/80.  She continues on Crestor for HLD management.  No further concerns at this time.  Denies new or worsening stroke/TIA symptoms or seizure activity.   initial visit 05/26/2018 PS: [name] is a pleasant 89 year Caucasian lady seen today for initial consultation visit following hospital admission recently for paresthesias. History is provided by the patient and review of electronic medical records. I personally reviewed imaging films.Vickie Wilson is an 89 y.o. female past medical history of hypertension, hyperlipidemia, diabetes mellitus, atrial mass who presents to the emergency room on 04/22/18 after having multiple spells of numbness over the left hand extending to the arm as well as left facial numbness.She stated her first episode was about 1 week ago lasting about 15 minutes mainly if he affecting her left hand, arm as well as the left side of her face. Resolved spontaneously.  Continued to have 3 more episodes and therefore today she decided to come to the hospital.  She denies any weakness, however states she was not able to use her hand like she normally could.  Patient denies any jerking/twitching movements of the left arm or face.  Work-up in the ER included MRI brain which revealed a small area of subarachnoid hemorrhage on MRI brain in the right frontal lobe convexity only. This was difficult to visualize on  the CT scan corresponding cuts..  Her CT scan was   negative h .  She denied any history of recent head trauma, however she had a fall few weeks ago. She was on ASA at home. EEG done on 04/23/18 was normal. Transthoracic echo showed normal ejection fraction. Patient was started on Keppra 500 twice daily and had no further episodes after that was started. 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 Neurosurgery was consulted and recommended conservative follow-up for the same. Patient states she was doing well after discharge until 5 days ago when she had 2 transient episodes of numbness in her left hand gradually going up to her left elbow but this time not involving her face. This occurred twice on one day and then once the next day. She has not had these episodes for the last 3 days at least. She remains on Keppra 500 twice daily which is tolerating well without any side effects. She states she has  not missed any doses.     ROS:   14 system review of systems is positive for those listed in HPI and all other systems negative  PMH:  Past Medical History:  Diagnosis Date   Allergy    Anxiety    Atrial mass    right   Constipation    Depression    Diabetes mellitus    Hyperlipidemia    Hypertension    Osteoporosis    Seizures (HCC)     Social History:  Social History   Socioeconomic History   Marital status: Single    Spouse name: Not on file   Number of children: 1   Years of education: Not on file   Highest education level: 12th grade  Occupational History   Occupation: retired  Tobacco Use   Smoking status: Former    Packs/day: 1.00    Years: 20.00    Total pack years: 20.00    Types: Cigarettes    Quit date: 03/13/1998    Years since quitting: 24.5   Smokeless tobacco: Never  Vaping Use   Vaping Use: Never used  Substance and Sexual Activity   Alcohol  use: No   Drug use: No   Sexual activity: Not Currently  Other Topics Concern   Not on file  Social History Narrative   Regular exercise-yes      Daily caffeine use      Right handed       03/27/21 Lives at home alone    Social Determinants of Health   Financial Resource Strain: Low Risk  (08/18/2022)   Overall Financial Resource Strain (CARDIA)    Difficulty of Paying Living Expenses: Not hard at all  Food Insecurity: No Food Insecurity (08/18/2022)   Hunger Vital Sign    Worried About Running Out of Food in the Last Year: Never true    Ran Out of Food in the Last Year: Never true  Transportation Needs: No Transportation Needs (08/18/2022)   PRAPARE - Administrator, Civil Service (Medical): No    Lack of Transportation (Non-Medical): No  Physical Activity: Inactive (08/18/2022)   Exercise Vital Sign    Days of Exercise per Week: 0 days    Minutes of Exercise per Session: 0 min  Stress: No Stress Concern Present (08/18/2022)   [employer] of Occupational Health - Occupational Stress Questionnaire    Feeling of Stress : Only a little  Social Connections: Moderately Integrated (08/18/2022)   Social Connection and Isolation Panel [NHANES]    Frequency of Communication with Friends and Family: More than three times a week    Frequency of Social Gatherings with Friends and Family: More than three times a week    Attends Religious Services: More than 4 times per year    Active Member of [employer] or Organizations: Yes    Attends Banker Meetings: More than 4 times per year    Marital Status: Widowed  Intimate Partner Violence: Not At Risk (08/18/2022)   Humiliation, Afraid, Rape, and Kick questionnaire    Fear of Current or Ex-Partner: No    Emotionally Abused: No    Physically Abused: No    Sexually Abused: No    Medications:   Current Outpatient Medications on File Prior to Visit  Medication Sig Dispense Refill   acetaminophen (TYLENOL 8 HOUR) 650  MG CR tablet Take 1 tablet (650 mg total) by mouth every 8 (eight) hours as needed for pain. 100 tablet 3  amLODipine (NORVASC) 5 MG tablet Take 1 tablet (5 mg total) by mouth daily. 90 tablet 3   atenolol (TENORMIN) 100 MG tablet TAKE (1/2) TABLET TWICE DAILY. 45 tablet 3   Cholecalciferol (VITAMIN D3) 50 MCG (2000 UT) capsule Take 1 capsule (2,000 Units total) by mouth daily. 100 capsule 3   fluticasone (FLONASE) 50 MCG/ACT nasal spray Place 2 sprays into both nostrils daily. 48 g 1   levETIRAcetam (KEPPRA) 750 MG tablet TAKE ONE TABLET BY MOUTH TWICE DAILY 180 tablet 3   loratadine (CLARITIN) 10 MG tablet Take 1 tablet (10 mg total) by mouth daily. (Patient not taking: Reported on 08/20/2022) 30 tablet 3   LORazepam (ATIVAN) 0.5 MG tablet Take 1 tablet (0.5 mg total) by mouth 2 (two) times daily as needed for anxiety. (Patient not taking: Reported on 08/18/2022) 60 tablet 1   Multiple Vitamins-Minerals (CENTRUM SILVER) CHEW Chew 1 each by mouth 3 (three) times a week.     PARoxetine (PAXIL) 20 MG tablet Take 1 tablet (20 mg total) by mouth daily. 90 tablet 1   promethazine-dextromethorphan (PROMETHAZINE-DM) 6.25-15 MG/5ML syrup Take 5 mLs by mouth 4 (four) times daily as needed for cough. 240 mL 0   rosuvastatin (CRESTOR) 20 MG tablet take 1/2 tablet (10mg ) by mouth Monday, Wednesday and Friday of each week.} 19 tablet 1   No current facility-administered medications on file prior to visit.    Allergies:   No Active Allergies    Vitals There were no vitals filed for this visit.  There is no height or weight on file to calculate BMI.  Physical Exam General: frail petite very pleasant although anxious elderly Caucasian lady, seated, in no evident distress Head: head normocephalic and atraumatic.   Neck: supple with no carotid or supraclavicular bruits Cardiovascular: regular rate and rhythm, no murmurs Musculoskeletal: mild kyphosis Skin:  no rash/petichiae Vascular:  Normal pulses  all extremities  Neurologic Exam Mental Status: Awake and fully alert.  Fluent speech and language.  Oriented to place and time. Recent and remote memory intact. Attention span, concentration and fund of knowledge appropriate. Mood and affect appropriate.  Cranial Nerves: Pupils equal, briskly reactive to light. Extraocular movements full without nystagmus. Visual fields full to confrontation. Hearing diminished bilaterally. Facial sensation intact. Face, tongue, palate moves normally and symmetrically.  Motor: Normal bulk and tone. Normal strength in all tested extremity muscles. Sensory.: intact to touch , pinprick , position and vibratory sensation.  Coordination: Rapid alternating movements normal in all extremities. Finger-to-nose and heel-to-shin performed accurately bilaterally. Gait and Station: Arises from chair without difficulty. Stance is normal. Gait demonstrates normal stride length and balance without use of assistive device.  Unable to heel, toe and tandem walk due to difficulty Reflexes: 1+ and symmetric. Toes downgoing.      ASSESSMENT: 89 year old lady with recurrent fleeting left arm and face paresthesias likely simple partial seizures likely symptomatic from small localized right frontal convexity subarachnoid hemorrhage which is non-aneurysmal and non-traumatic in etiology. Great concern regarding lack of family support (as stated) causing depression/anxiety and isolation feeling    PLAN:  -Continue Keppra 750 mg twice daily - attempted to lower at prior visit but felt "shaky and ill" therefore increased dose back with resolution of symptoms -discussed establishing care with [facility] for further assistance of depression/anxiety symptoms -discussed community support and to speak with insurance company to see what programs they may offer   Follow-up in 1 year or call earlier if needed    CC:  Plotnikov, Evie Lacks, MD     I spent 36 minutes of face-to-face and  non-face-to-face time with patient.  This included previsit chart review, lab review, study review, order entry, electronic health record documentation, patient education regarding seizures currently managed on Keppra, prolonged discussion regarding mental health concerns and answered all other questions to patient satisfaction  Frann Rider, [facility]  [facility] [address] [place], [place] [address]  Phone [phone] Fax [phone] Note: This document was prepared with digital dictation and possible smart phrase technology. Any transcriptional errors that result from this process are unintentional.

## 2022-10-07 ENCOUNTER — Encounter: Payer: Self-pay | Admitting: Adult Health

## 2022-10-07 ENCOUNTER — Ambulatory Visit: Payer: Medicare Other | Admitting: Adult Health

## 2022-10-07 VITALS — BP 120/61 | HR 64 | Ht 60.0 in | Wt 101.0 lb

## 2022-10-07 DIAGNOSIS — G40109 Localization-related (focal) (partial) symptomatic epilepsy and epileptic syndromes with simple partial seizures, not intractable, without status epilepticus: Secondary | ICD-10-CM | POA: Diagnosis not present

## 2022-10-07 MED ORDER — LORATADINE 10 MG PO TABS: 10.0000 mg | ORAL_TABLET | Freq: Every day | ORAL | 11 refills | Status: DC

## 2022-10-07 MED ORDER — LEVETIRACETAM 750 MG PO TABS: 750.0000 mg | ORAL_TABLET | Freq: Two times a day (BID) | ORAL | 3 refills | Status: DC

## 2022-10-07 MED ORDER — FLUTICASONE PROPIONATE 50 MCG/ACT NA SUSP: 2.0000 | Freq: Every day | NASAL | 11 refills | Status: DC

## 2022-10-07 NOTE — Patient Instructions (Addendum)
Continue Keppra 750 mg twice daily for seizure prevention  Please call with any seizure activity    Follow up in 1 year or call earlier if needed

## 2022-10-07 NOTE — Telephone Encounter (Signed)
She needs to take Flonase nasal spray together with loratadine tablet daily; both prescriptions were sent to the pharmacy.. Other sprays are usually much more expensive. Thanks

## 2022-10-08 ENCOUNTER — Ambulatory Visit (INDEPENDENT_AMBULATORY_CARE_PROVIDER_SITE_OTHER): Payer: Medicare Other | Admitting: Psychologist

## 2022-10-08 DIAGNOSIS — F320 Major depressive disorder, single episode, mild: Secondary | ICD-10-CM | POA: Diagnosis not present

## 2022-10-08 NOTE — Progress Notes (Signed)
                Kenyatte Chatmon, PsyD 

## 2022-10-08 NOTE — Progress Notes (Signed)
[place] Counselor Initial Adult Exam  Name: Vickie Wilson Date: 10/08/2022 MRN: [id number] DOB: 1931/02/28 PCP: Cassandria Anger, MD  Time spent: 03:40 pm to 04:20 pm; total time: 40 minutes  This session was held via in person. The patient consented to in-person therapy and was in the clinician's office. Limits of confidentiality were discussed with the patient.    Guardian/Payee:  NA    Paperwork requested: No   Reason for Visit /Presenting Problem: Depression  Mental Status Exam: Appearance:   Well Groomed     Behavior:  Appropriate  Motor:  Normal  Speech/Language:   Clear and Coherent  Affect:  Appropriate  Mood:  normal  Thought process:  normal  Thought content:    WNL  Sensory/Perceptual disturbances:    WNL  Orientation:  oriented to person, place, and time/date  Attention:  Good  Concentration:  Good  Memory:  WNL  Fund of knowledge:   Good  Insight:    Good  Judgment:   Good  Impulse Control:  Good    Reported Symptoms:  The patient endorsed experiencing the following: feeling down, sad, rumination of thoughts, social isolation, avoiding pleasurable activities, low self-esteem, fatigue, and lack of motivation. She denied suicidal and homicidal ideation.   Risk Assessment: Danger to Self:  No Self-injurious Behavior: No Danger to Others: No Duty to Warn:no Physical Aggression / Violence:No  Access to Firearms a concern: No  Gang Involvement:No  Patient / guardian was educated about steps to take if suicide or homicide risk level increases between visits: n/a While future psychiatric events cannot be accurately predicted, the patient does not currently require acute inpatient psychiatric care and does not currently meet [facility] involuntary commitment criteria.  Substance Abuse History: Current substance abuse: No     Past Psychiatric History:   No previous psychological problems have been observed Outpatient  Providers:NA History of Psych Hospitalization: No  Psychological Testing:  NA    Abuse History:  Victim of: No.,  NA    Report needed: No. Victim of Neglect:No. Perpetrator of  NA   Witness / Exposure to Domestic Violence: No   Protective Services Involvement: No  Witness to [employer] Violence:  No   Family History:  Family History  Problem Relation Age of Onset   Heart disease Mother    Alcohol abuse Father    Heart disease Father    Hyperlipidemia Other    Hypertension Other    Stroke Other     Living situation: the patient lives alone  Sexual Orientation: Straight  Relationship Status: divorced  Name of spouse / other:na If a parent, number of children / ages:Patient has one daughter whose name is [place]: friends  Museum/gallery curator Stress:  Yes   Income/Employment/Disability: Actor: No   Educational History: Education: high school diploma/GED  Religion/Sprituality/World View: Christian  Any cultural differences that may affect / interfere with treatment:  not applicable   Recreation/Hobbies: Being with people  Stressors: Other: Challenging relationship with daughter Vinnie Level as she feels like her daughter does not care for her.     Strengths: Supportive Relationships  Barriers:  NA   Legal History: Pending legal issue / charges: The patient has no significant history of legal issues. History of legal issue / charges:  NA  Medical History/Surgical History: reviewed Past Medical History:  Diagnosis Date   Allergy    Anxiety    Atrial mass    right  Constipation    Depression    Diabetes mellitus    Hyperlipidemia    Hypertension    Osteoporosis    Seizures ([place])     Past Surgical History:  Procedure Laterality Date   DILATION AND CURETTAGE OF UTERUS  1987    Medications: Current Outpatient Medications  Medication Sig Dispense Refill   acetaminophen (TYLENOL 8 HOUR) 650 MG CR tablet  Take 1 tablet (650 mg total) by mouth every 8 (eight) hours as needed for pain. 100 tablet 3   amLODipine (NORVASC) 5 MG tablet Take 1 tablet (5 mg total) by mouth daily. 90 tablet 3   atenolol (TENORMIN) 100 MG tablet TAKE (1/2) TABLET TWICE DAILY. 45 tablet 3   Cholecalciferol (VITAMIN D3) 50 MCG (2000 UT) capsule Take 1 capsule (2,000 Units total) by mouth daily. 100 capsule 3   fluticasone (FLONASE) 50 MCG/ACT nasal spray Place 2 sprays into both nostrils daily. 48 g 11   levETIRAcetam (KEPPRA) 750 MG tablet Take 1 tablet (750 mg total) by mouth 2 (two) times daily. 180 tablet 3   loratadine (CLARITIN) 10 MG tablet Take 1 tablet (10 mg total) by mouth daily. 30 tablet 11   LORazepam (ATIVAN) 0.5 MG tablet Take 1 tablet (0.5 mg total) by mouth 2 (two) times daily as needed for anxiety. 60 tablet 1   Multiple Vitamins-Minerals (CENTRUM SILVER) CHEW Chew 1 each by mouth 3 (three) times a week.     PARoxetine (PAXIL) 20 MG tablet Take 1 tablet (20 mg total) by mouth daily. 90 tablet 1   promethazine-dextromethorphan (PROMETHAZINE-DM) 6.25-15 MG/5ML syrup Take 5 mLs by mouth 4 (four) times daily as needed for cough. 240 mL 0   rosuvastatin (CRESTOR) 20 MG tablet take 1/2 tablet (10mg ) by mouth Monday, Wednesday and Friday of each week.} 19 tablet 1   No current facility-administered medications for this visit.    No Active Allergies  Diagnoses:  F32.0 major depressive affective disorder, single episode, mild  Plan of Care: The patient is an 89 year old Caucasian female who was referred due to experiencing depressive symptoms. The patient lives alone. The patient meets criteria for a diagnosis of F32.0 major depressive affective disorder, single episode, mild based off of the following: feeling down, sad, rumination of thoughts, social isolation, avoiding pleasurable activities, low self-esteem, fatigue, and lack of motivation. She denied suicidal and homicidal ideation.   The patient stated  that she wants to work on her relationship with her daughter Vinnie Level). She also possibly wants to be in a relationship or at least have a companion to spend more time with during her remaining time alive.  This psychologist makes the recommendation that the patient participate in therapy bi-weekly.    Conception Chancy, PsyD

## 2022-10-09 NOTE — Telephone Encounter (Signed)
Tried reaching pt and there was no answer to inform her of providers advice. Unable to lvm due to no vm box.

## 2022-10-29 ENCOUNTER — Ambulatory Visit: Payer: Medicare Other | Admitting: Psychologist

## 2022-11-19 ENCOUNTER — Ambulatory Visit: Payer: Medicare Other | Admitting: Psychologist

## 2022-11-23 ENCOUNTER — Telehealth: Payer: Self-pay | Admitting: Internal Medicine

## 2022-11-23 NOTE — Telephone Encounter (Signed)
Called pt no answer x's 10 rings.../lmb 

## 2022-11-23 NOTE — Telephone Encounter (Signed)
Pt need f/u appt. Appt has been made for 12/09/22...Vickie Wilson

## 2022-11-23 NOTE — Telephone Encounter (Signed)
ATC but was given message "call cannot be completed at this time"

## 2022-11-23 NOTE — Telephone Encounter (Signed)
Patient called stating that she is still having chest congestion. Patient was trying to see Dr. Camila Li but his next available is not until March 27th. Tried to get patient scheduled to see another provider but patient insisted to speak with Dr. Parks Neptune nurse 1st. Best callback number for patient is [phone].

## 2022-12-03 ENCOUNTER — Ambulatory Visit (INDEPENDENT_AMBULATORY_CARE_PROVIDER_SITE_OTHER): Payer: Medicare Other

## 2022-12-03 ENCOUNTER — Ambulatory Visit (INDEPENDENT_AMBULATORY_CARE_PROVIDER_SITE_OTHER): Payer: Medicare Other | Admitting: Internal Medicine

## 2022-12-03 ENCOUNTER — Encounter: Payer: Self-pay | Admitting: Internal Medicine

## 2022-12-03 VITALS — BP 130/70 | HR 58 | Temp 98.6°F | Ht 60.0 in | Wt 103.0 lb

## 2022-12-03 DIAGNOSIS — J069 Acute upper respiratory infection, unspecified: Secondary | ICD-10-CM | POA: Diagnosis not present

## 2022-12-03 DIAGNOSIS — I10 Essential (primary) hypertension: Secondary | ICD-10-CM | POA: Diagnosis not present

## 2022-12-03 DIAGNOSIS — R051 Acute cough: Secondary | ICD-10-CM

## 2022-12-03 DIAGNOSIS — R059 Cough, unspecified: Secondary | ICD-10-CM | POA: Diagnosis not present

## 2022-12-03 DIAGNOSIS — J449 Chronic obstructive pulmonary disease, unspecified: Secondary | ICD-10-CM | POA: Diagnosis not present

## 2022-12-03 MED ORDER — AZITHROMYCIN 250 MG PO TABS: ORAL_TABLET | ORAL | 0 refills | Status: DC

## 2022-12-03 MED ORDER — METHYLPREDNISOLONE 4 MG PO TBPK: ORAL_TABLET | ORAL | 0 refills | Status: DC

## 2022-12-03 MED ORDER — PROMETHAZINE-DM 6.25-15 MG/5ML PO SYRP: 5.0000 mL | ORAL_SOLUTION | Freq: Four times a day (QID) | ORAL | 0 refills | Status: DC | PRN

## 2022-12-03 NOTE — Assessment & Plan Note (Addendum)
?  COPD - remote smoker Medrol pack Zpack Cough syrup Chest CT if not well soon

## 2022-12-03 NOTE — Progress Notes (Signed)
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       19 - 38  TAPSE                                    19.2  mm       ----------  RV pressure, S, DP                       27    mm Hg    <=30  RV s&', lateral, S                        10.4  cm/s     ----------   Pulmonic valve                           Value          Reference  Pulmonic valve peak velocity, S          84.2  cm/s     ---------- Legend: (L)  and  (H)  mark values outside specified reference range. ------------------------------------------------------------------- Prepared and Electronically Authenticated by Pierre Bali, MD 2019-08-10T15:35:38  EEG adult  Result Date: 04/23/2018 Greta Doom, MD     04/23/2018  1:34 PM History: 89 yo F with intermittent numbness Sedation: None Technique: This is a 21 channel routine scalp EEG performed at the bedside with bipolar and monopolar montages arranged in accordance to the international 10/20 system of electrode placement. One channel was dedicated to EKG recording. Background: The background consists of intermixed alpha and beta activities. There is a well defined posterior dominant  rhythm of 9-10 Hz that attenuates with eye opening. Sleep is not recorded. Photic stimulation: Physiologic driving is not performed EEG Abnormalities: None Clinical Interpretation: This normal EEG is recorded in the waking state. There was no seizure or seizure predisposition recorded on this study. Please note that a normal EEG does not preclude the possibility of epilepsy. Roland Rack, MD [facility] [phone] If 7pm- 7am, please page neurology on call as listed in [place].  MR Brain Wo Contrast (neuro protocol)  Result Date: 04/22/2018 CLINICAL DATA:  Numbness or tingling, paresthesia. Intermittent left arm numbness for 2 weeks EXAM: MRI HEAD WITHOUT CONTRAST TECHNIQUE: Multiplanar, multiecho pulse sequences of the brain and surrounding structures were obtained without intravenous contrast. COMPARISON:  CT from earlier today FINDINGS: Brain: Small focus of subarachnoid FLAIR hyperintensity and gradient hypointensity along the precentral gyrus on the right, this area is high-density by CT. No other site of hemorrhage seen, either acute or chronic. There is mild for age chronic small vessel ischemia in the cerebral white matter. Mild cerebral volume loss. Clustered appearance of gyri at the left vertex, nonspecific and likely noncontributory. No underlying mass is seen and there is no extrinsic mass effect by collection. No hydrocephalus. Vascular: Major flow voids are preserved Skull and upper cervical spine: No evidence of marrow lesion. Diffuse cervical facet spurring with mild C3-4 and C4-5 anterolisthesis. Sinuses/Orbits: Left cataract resection. Other: These results were called by telephone at the time of interpretation on 04/22/2018 at 2:10 pm to Dr. Francine Graven , who verbally acknowledged these results. IMPRESSION: 1. Small volume subarachnoid hemorrhage along the high right frontal convexity. Trauma, coagulopathy, or vasculopathy/amyloid are primary considerations in this  location. There is normal superior sagittal sinus flow voids. No acute or remote hemorrhage seen elsewhere. 2. Mild atrophy and chronic small vessel ischemia for age. Electronically Signed   By: Monte Fantasia M.D.   On: 04/22/2018 14:14   CT HEAD WO CONTRAST  Result Date: 04/22/2018 CLINICAL DATA:  Left arm numbness. EXAM: CT HEAD WITHOUT CONTRAST CT CERVICAL SPINE WITHOUT CONTRAST TECHNIQUE: Multidetector CT imaging of the head and cervical spine was performed following the standard protocol without intravenous contrast. Multiplanar CT image reconstructions of the cervical spine were also generated. COMPARISON:  None. FINDINGS: CT HEAD FINDINGS Brain: Mild diffuse cortical atrophy is noted. Mild chronic ischemic white matter disease is noted. No mass effect or midline shift is noted. Ventricular size is within normal limits. There is no evidence of mass lesion, hemorrhage or acute infarction. Vascular: No hyperdense vessel or unexpected calcification. Skull: Normal. Negative for fracture or focal lesion. Sinuses/Orbits: No acute finding. Other: None. CT CERVICAL SPINE FINDINGS Alignment: Grade 1 anterolisthesis of C3-4 C4-5 is noted secondary to posterior facet joint hypertrophy. Skull base and vertebrae: No acute fracture. No primary bone lesion or focal pathologic process. Soft tissues and spinal canal: No prevertebral fluid or swelling. No visible canal hematoma. Disc levels: Severe degenerative disc disease is noted at C5-6 and C6-7 with anterior osteophyte formation. Mild degenerative disc disease is noted at C4-5 and C7-T1. Upper chest: Negative. Other: Degenerative changes seen involving posterior facet joints bilaterally, right greater than left. IMPRESSION: Mild diffuse cortical atrophy. Mild chronic ischemic white matter disease. No acute intracranial abnormality seen. Multilevel degenerative disc disease is noted in the cervical spine. No fracture or other acute abnormality is noted. Electronically  Signed   By: Marijo Conception, M.D.   On: 04/22/2018 11:09   CT Cervical Spine Wo Contrast  Result Date: 04/22/2018 CLINICAL DATA:  Left arm numbness. EXAM: CT HEAD WITHOUT CONTRAST CT CERVICAL SPINE WITHOUT CONTRAST TECHNIQUE: Multidetector CT imaging of the head and cervical spine was performed following the standard protocol without intravenous contrast. Multiplanar CT image reconstructions of the cervical spine were also generated. COMPARISON:  None. FINDINGS: CT HEAD FINDINGS Brain: Mild diffuse cortical atrophy is noted. Mild chronic ischemic white matter disease is noted. No mass effect or midline shift is  noted. Ventricular size is within normal limits. There is no evidence of mass lesion, hemorrhage or acute infarction. Vascular: No hyperdense vessel or unexpected calcification. Skull: Normal. Negative for fracture or focal lesion. Sinuses/Orbits: No acute finding. Other: None. CT CERVICAL SPINE FINDINGS Alignment: Grade 1 anterolisthesis of C3-4 C4-5 is noted secondary to posterior facet joint hypertrophy. Skull base and vertebrae: No acute fracture. No primary bone lesion or focal pathologic process. Soft tissues and spinal canal: No prevertebral fluid or swelling. No visible canal hematoma. Disc levels: Severe degenerative disc disease is noted at C5-6 and C6-7 with anterior osteophyte formation. Mild degenerative disc disease is noted at C4-5 and C7-T1. Upper chest: Negative. Other: Degenerative changes seen involving posterior facet joints bilaterally, right greater than left. IMPRESSION: Mild diffuse cortical atrophy. Mild chronic ischemic white matter disease. No acute intracranial abnormality seen. Multilevel degenerative disc disease is noted in the cervical spine. No fracture or other acute abnormality is noted. Electronically Signed   By: Marijo Conception, M.D.   On: 04/22/2018 11:09   DG Chest 2 View  Result Date: 04/22/2018 CLINICAL DATA:  Intermittent LEFT arm numbness for 2 weeks, history  hypertension, diabetes mellitus, anxiety, former smoker EXAM: CHEST - 2 VIEW COMPARISON:  02/12/2018 FINDINGS: Normal heart size, mediastinal contours, and pulmonary vascularity. Atherosclerotic calcification aorta. Lungs hyperinflated with minimal subsegmental atelectasis at LEFT costophrenic angle. Remaining lungs clear. No acute infiltrate, pleural effusion or pneumothorax. Bones appear demineralized with old healed fracture of the posterolateral RIGHT sixth rib. IMPRESSION: Hyperinflated lungs with minimal subsegmental atelectasis at LEFT costophrenic angle. Electronically Signed   By: Lavonia Dana M.D.   On: 04/22/2018 11:07    Assessment & Plan:   Problem List Items Addressed This Visit       Cardiovascular and Mediastinum   HTN (hypertension)    Cont on Norvasc, Atenolol        Respiratory   Upper respiratory infection    New Zpac CXR      Relevant Medications   azithromycin (ZITHROMAX Z-PAK) 250 MG tablet     Other   Cough - Primary    ?COPD - remote smoker Medrol pack Zpack Cough syrup Chest CT if not well soon      Relevant Orders   DG Chest 2 View      Meds ordered this encounter  Medications   promethazine-dextromethorphan (PROMETHAZINE-DM) 6.25-15 MG/5ML syrup    Sig: Take 5 mLs by mouth 4 (four) times daily as needed for cough.    Dispense:  240 mL    Refill:  0   methylPREDNISolone (MEDROL DOSEPAK) 4 MG TBPK tablet    Sig: As directed    Dispense:  21 tablet    Refill:  0   azithromycin (ZITHROMAX Z-PAK) 250 MG tablet    Sig: As directed    Dispense:  6 tablet    Refill:  0      Follow-up: Return in about 3 months (around 03/05/2023) for a follow-up visit.  Walker Kehr, MD

## 2022-12-03 NOTE — Assessment & Plan Note (Signed)
New Zpac CXR

## 2022-12-03 NOTE — Assessment & Plan Note (Signed)
Cont on Norvasc, Atenolol 

## 2022-12-12 ENCOUNTER — Other Ambulatory Visit: Payer: Self-pay | Admitting: Internal Medicine

## 2023-01-04 ENCOUNTER — Other Ambulatory Visit: Payer: Self-pay | Admitting: Internal Medicine

## 2023-01-05 ENCOUNTER — Telehealth: Payer: Self-pay | Admitting: Internal Medicine

## 2023-01-05 NOTE — Telephone Encounter (Signed)
Patient called and asked for a callback for clarification on the dosage of PARoxetine (PAXIL) 20 MG tablet . She would like a callback at [phone].

## 2023-01-05 NOTE — Telephone Encounter (Signed)
Called pt no answer x's 10 rings.../lmb 

## 2023-01-06 NOTE — Telephone Encounter (Signed)
Called pt no answer x 10 rings.Marland KitchenRaechel Wilson

## 2023-01-21 DIAGNOSIS — H52203 Unspecified astigmatism, bilateral: Secondary | ICD-10-CM | POA: Diagnosis not present

## 2023-01-21 DIAGNOSIS — H353132 Nonexudative age-related macular degeneration, bilateral, intermediate dry stage: Secondary | ICD-10-CM | POA: Diagnosis not present

## 2023-01-21 DIAGNOSIS — H26491 Other secondary cataract, right eye: Secondary | ICD-10-CM | POA: Diagnosis not present

## 2023-01-28 ENCOUNTER — Other Ambulatory Visit: Payer: Self-pay | Admitting: Internal Medicine

## 2023-02-09 ENCOUNTER — Other Ambulatory Visit: Payer: Self-pay | Admitting: Internal Medicine

## 2023-02-09 ENCOUNTER — Telehealth: Payer: Self-pay | Admitting: Adult Health

## 2023-02-09 NOTE — Telephone Encounter (Signed)
Pt called wanting to know if the RN can call her back to discuss her seizures. Please advise.

## 2023-02-09 NOTE — Telephone Encounter (Signed)
Pt stated she is having pain in right sided of her head. She is requesting a call back from nurse.

## 2023-02-10 ENCOUNTER — Telehealth: Payer: Self-pay | Admitting: Internal Medicine

## 2023-02-10 NOTE — Telephone Encounter (Signed)
Pt called wanting to speak Dr. Macario Golds advised pt Dr Macario Golds is currently out of office . Pt would not give me to much information but was asking about urology. Please call pt.

## 2023-02-10 NOTE — Telephone Encounter (Signed)
Returned pt's call. Pt stated she had already scheduled an appointment to see Ihor Austin on 04/2023, she was going to discuss this left sided pain on her head that she is gets every now and then at her visit. Pt thanked me for calling.

## 2023-02-10 NOTE — Telephone Encounter (Signed)
Called pt no answer x's 10 rings.../lmb 

## 2023-02-10 NOTE — Telephone Encounter (Signed)
Called patient left message for her to return our call.   **if pt returns call, please see if we can get more information. Please ask pt is she is currently having seizures, if yes when was her last seizure. Please ask pt is she is taking medications as prescribed.**

## 2023-02-10 NOTE — Telephone Encounter (Signed)
Pt has called back and reports she did not mention on yesterday concerns about a seizure.  Pt states within the last few weeks she had experienced a pain/sensation on left side of head lasting 10-15 mins.  Pt is asking that she be called to discuss this, she also agreed to a f/u and to be placed on wait list as well. Pt was told to go to ED if her pain escalated and she felt that she needed urgent care.

## 2023-02-11 NOTE — Telephone Encounter (Signed)
Called pt again still no answer x's 10  rings.Closing encounter .Vickie Wilson

## 2023-02-24 DIAGNOSIS — H26491 Other secondary cataract, right eye: Secondary | ICD-10-CM | POA: Diagnosis not present

## 2023-03-08 ENCOUNTER — Ambulatory Visit (INDEPENDENT_AMBULATORY_CARE_PROVIDER_SITE_OTHER): Payer: Medicare Other | Admitting: Family Medicine

## 2023-03-08 ENCOUNTER — Encounter: Payer: Self-pay | Admitting: Family Medicine

## 2023-03-08 VITALS — BP 130/72 | HR 66 | Temp 97.8°F | Resp 20 | Ht 60.0 in | Wt 104.0 lb

## 2023-03-08 DIAGNOSIS — Z638 Other specified problems related to primary support group: Secondary | ICD-10-CM | POA: Diagnosis not present

## 2023-03-08 DIAGNOSIS — G3184 Mild cognitive impairment, so stated: Secondary | ICD-10-CM | POA: Diagnosis not present

## 2023-03-08 DIAGNOSIS — F3342 Major depressive disorder, recurrent, in full remission: Secondary | ICD-10-CM | POA: Diagnosis not present

## 2023-03-08 NOTE — Patient Instructions (Signed)
Increase your Paxil from 10 mg to 20 mg once daily.

## 2023-03-08 NOTE — Progress Notes (Signed)
Assessment & Plan:  1. Mild cognitive impairment SLUMS examination completed with a score of 24 in a patient with less than high school education. Resources provided to the daughter to help her in caring for her mother. - AMB Referral to Chronic Care Management Services  2-3. Stress due to family tension/Recurrent major depressive disorder, in full remission [facility]) Encouraged patient to take medication as prescribed. - Ambulatory referral to Psychiatry - AMB Referral to Chronic Care Management Services   Follow up plan: Return for as scheduled with PCP.  Deliah Boston, MSN, APRN, FNP-C  Subjective:  HPI: Vickie Wilson is a 89 y.o. female presenting on 03/08/2023 for shakey  (Wore out - )  Patient is accompanied by her daughter, who she is okay with being present.  Patient is concerned about feeling shaky.  She feels this is due to a combination of stress and inadequate dietary habits.  She lives alone in a townhouse.  She handles all of her medications and reports she has been taking half the prescribed dose of Paxil as she was not sure since the dosage being dispensed keeps changing between 10 mg and 20 mg. She does not feel well supported by her only daughter. They do not have a great relationship.     ROS: Negative unless specifically indicated above in HPI.   Relevant past medical history reviewed and updated as indicated.   Allergies and medications reviewed and updated.   Current Outpatient Medications:    amLODipine (NORVASC) 5 MG tablet, Take 1 tablet (5 mg total) by mouth daily. Annual appt due must see provider for future refills, Disp: 30 tablet, Rfl: 11   atenolol (TENORMIN) 100 MG tablet, TAKE 1/2 TABLET BY MOUTH TWICE DAILY, Disp: 45 tablet, Rfl: 3   Cholecalciferol (VITAMIN D3) 50 MCG (2000 UT) capsule, Take 1 capsule (2,000 Units total) by mouth daily., Disp: 100 capsule, Rfl: 3   levETIRAcetam (KEPPRA) 750 MG tablet, Take 1 tablet (750 mg total) by mouth 2  (two) times daily., Disp: 180 tablet, Rfl: 3   Multiple Vitamins-Minerals (CENTRUM SILVER) CHEW, Chew 1 each by mouth 3 (three) times a week., Disp: , Rfl:    PARoxetine (PAXIL) 20 MG tablet, Take 1 tablet (20 mg total) by mouth daily., Disp: 90 tablet, Rfl: 1   rosuvastatin (CRESTOR) 20 MG tablet, TAKE 1/2 TABLET BY MOUTH ON MONDAY, WEDNESDAY AND FRIDAY, Disp: 30 tablet, Rfl: 0   acetaminophen (TYLENOL 8 HOUR) 650 MG CR tablet, Take 1 tablet (650 mg total) by mouth every 8 (eight) hours as needed for pain., Disp: 100 tablet, Rfl: 3   LORazepam (ATIVAN) 0.5 MG tablet, Take 1 tablet (0.5 mg total) by mouth 2 (two) times daily as needed for anxiety. (Patient not taking: Reported on 03/08/2023), Disp: 60 tablet, Rfl: 1  No Active Allergies  Objective:   BP 130/72   Pulse 66   Temp 97.8 F (36.6 C)   Resp 20   Ht 5' (1.524 m)   Wt 104 lb (47.2 kg)   BMI 20.31 kg/m    Physical Exam Vitals reviewed.  Constitutional:      General: She is not in acute distress.    Appearance: Normal appearance. She is not ill-appearing, toxic-appearing or diaphoretic.  HENT:     Head: Normocephalic and atraumatic.  Eyes:     General: No scleral icterus.       Right eye: No discharge.        Left eye: No discharge.  Conjunctiva/sclera: Conjunctivae normal.  Cardiovascular:     Rate and Rhythm: Normal rate.  Pulmonary:     Effort: Pulmonary effort is normal. No respiratory distress.  Musculoskeletal:        General: Normal range of motion.     Cervical back: Normal range of motion.  Skin:    General: Skin is warm and dry.     Capillary Refill: Capillary refill takes less than 2 seconds.  Neurological:     General: No focal deficit present.     Mental Status: She is alert and oriented to person, place, and time. Mental status is at baseline.  Psychiatric:        Attention and Perception: Attention and perception normal.        Mood and Affect: Mood normal. Affect is angry (towards daughter).         Speech: Speech normal.        Behavior: Behavior normal.        Thought Content: Thought content normal.        Judgment: Judgment normal.    SLUMS examination completed. Patient scored a 24. She did not finish high school.

## 2023-03-10 ENCOUNTER — Telehealth: Payer: Self-pay

## 2023-03-10 NOTE — Progress Notes (Signed)
  Chronic Care Management   Note  03/10/2023 Name: ERYCA BOLTE MRN: [id number] DOB: 04-02-1931  BARABARA MOTZ is a 89 y.o. year old female who is a primary care patient of Plotnikov, Georgina Quint, MD. I reached out to Murlean Caller by phone today in response to a referral sent by Ms. Chrisandra Netters PCP.  The first contact attempt was unsuccessful.   Follow up plan: Additional outreach attempts will be made.  Penne Lash, RMA Care Guide [facility]  [place], [place] [address] Direct Dial: [phone] [email or web address]

## 2023-03-17 ENCOUNTER — Ambulatory Visit: Payer: Medicare Other | Admitting: Internal Medicine

## 2023-03-23 ENCOUNTER — Telehealth: Payer: Self-pay | Admitting: Internal Medicine

## 2023-03-23 NOTE — Telephone Encounter (Signed)
Tried calling pt no answer and can't leave msg due to not having vm.Marland KitchenRaechel Wilson

## 2023-03-23 NOTE — Telephone Encounter (Signed)
Tried calling pt again still no answer.Marland KitchenShearon Wilson

## 2023-03-23 NOTE — Telephone Encounter (Signed)
Patient would like a call back from a nurse about her referral for therapy. She has some confusion about it. Best callback is [phone].

## 2023-03-23 NOTE — Telephone Encounter (Signed)
Pt call back wanting a call back [phone]. Please advise.

## 2023-03-23 NOTE — Telephone Encounter (Signed)
Patient called back, will be waiting on your call

## 2023-03-23 NOTE — Telephone Encounter (Signed)
Called pt back she states she would like MD to p[lace a referral. She need to go talk to someone due to her depression, family ect...MD referred her out once before but he is no longer there.Marland KitchenRaechel Chute

## 2023-03-25 NOTE — Telephone Encounter (Signed)
Notified pt w/ MD response. Gave her Phone number to [employer].Vickie KitchenRaechel Chute

## 2023-03-25 NOTE — Telephone Encounter (Signed)
The referral was placed on 03/08/2023 by [facility]; the patient can call [facility]. Thanks

## 2023-03-29 ENCOUNTER — Telehealth: Payer: Self-pay | Admitting: Internal Medicine

## 2023-03-29 NOTE — Telephone Encounter (Signed)
Patient called and said heart feels like it's beating faster than usual. She said it's happened before and Dr. Posey Rea was aware.  Best callback is [phone].

## 2023-03-30 NOTE — Progress Notes (Signed)
  Chronic Care Management   Note  03/30/2023 Name: MALAIAH VIRAMONTES MRN: [id number] DOB: 1930-12-10  JOSALIN CARNEIRO is a 89 y.o. year old female who is a primary care patient of Plotnikov, Georgina Quint, MD. I reached out to Murlean Caller by phone today in response to a referral sent by Ms. Chrisandra Netters PCP.  The second contact attempt was unsuccessful.   Follow up plan: Additional outreach attempts will be made.  Penne Lash, RMA Care Guide [facility]  [place], [place] [address] Direct Dial: [phone] [email or web address]

## 2023-03-30 NOTE — Telephone Encounter (Signed)
Called pt bck she states she is having a lot of depression sxs. She need to know what exactly medication she should be taking. She is coming in on 7/31 @ 1:40 for appt

## 2023-03-30 NOTE — Telephone Encounter (Signed)
Pt call back wanting a phone call.

## 2023-03-30 NOTE — Telephone Encounter (Signed)
Patient called back and would like someone to give her a call today.

## 2023-04-06 NOTE — Telephone Encounter (Signed)
Paroxetine 20 mg daily.  Please keep return office visit.  Thanks

## 2023-04-06 NOTE — Telephone Encounter (Signed)
Called pt no answer LMOM RTC../lb 

## 2023-04-06 NOTE — Telephone Encounter (Signed)
Called pt again still no answer left msg on vm../l;,mb

## 2023-04-08 ENCOUNTER — Other Ambulatory Visit: Payer: Self-pay

## 2023-04-08 ENCOUNTER — Other Ambulatory Visit (HOSPITAL_BASED_OUTPATIENT_CLINIC_OR_DEPARTMENT_OTHER): Payer: Self-pay

## 2023-04-08 ENCOUNTER — Encounter (HOSPITAL_BASED_OUTPATIENT_CLINIC_OR_DEPARTMENT_OTHER): Payer: Self-pay | Admitting: Emergency Medicine

## 2023-04-08 ENCOUNTER — Emergency Department (HOSPITAL_BASED_OUTPATIENT_CLINIC_OR_DEPARTMENT_OTHER)
Admission: EM | Admit: 2023-04-08 | Discharge: 2023-04-08 | Disposition: A | Discharge status: Home / Self Care | Payer: Medicare Other | Attending: Emergency Medicine | Admitting: Emergency Medicine

## 2023-04-08 ENCOUNTER — Emergency Department (HOSPITAL_BASED_OUTPATIENT_CLINIC_OR_DEPARTMENT_OTHER): Payer: Medicare Other

## 2023-04-08 DIAGNOSIS — I700 Atherosclerosis of aorta: Secondary | ICD-10-CM | POA: Diagnosis not present

## 2023-04-08 DIAGNOSIS — R002 Palpitations: Secondary | ICD-10-CM | POA: Diagnosis not present

## 2023-04-08 DIAGNOSIS — S2231XA Fracture of one rib, right side, initial encounter for closed fracture: Secondary | ICD-10-CM | POA: Diagnosis not present

## 2023-04-08 DIAGNOSIS — I10 Essential (primary) hypertension: Secondary | ICD-10-CM | POA: Diagnosis not present

## 2023-04-08 DIAGNOSIS — Z79899 Other long term (current) drug therapy: Secondary | ICD-10-CM | POA: Diagnosis not present

## 2023-04-08 DIAGNOSIS — N3000 Acute cystitis without hematuria: Secondary | ICD-10-CM | POA: Insufficient documentation

## 2023-04-08 DIAGNOSIS — R270 Ataxia, unspecified: Secondary | ICD-10-CM | POA: Insufficient documentation

## 2023-04-08 DIAGNOSIS — E119 Type 2 diabetes mellitus without complications: Secondary | ICD-10-CM | POA: Diagnosis not present

## 2023-04-08 DIAGNOSIS — R000 Tachycardia, unspecified: Secondary | ICD-10-CM | POA: Diagnosis not present

## 2023-04-08 DIAGNOSIS — R918 Other nonspecific abnormal finding of lung field: Secondary | ICD-10-CM | POA: Diagnosis not present

## 2023-04-08 LAB — CBC
HCT: 41.4 % (ref 36.0–46.0)
Hemoglobin: 13.9 g/dL (ref 12.0–15.0)
MCH: 28.1 pg (ref 26.0–34.0)
MCHC: 33.6 g/dL (ref 30.0–36.0)
MCV: 83.8 fL (ref 80.0–100.0)
Platelets: 326 10*3/uL (ref 150–400)
RBC: 4.94 MIL/uL (ref 3.87–5.11)
RDW: 14.3 % (ref 11.5–15.5)
WBC: 6.2 10*3/uL (ref 4.0–10.5)
nRBC: 0 % (ref 0.0–0.2)

## 2023-04-08 LAB — MAGNESIUM: Magnesium: 1.9 mg/dL (ref 1.7–2.4)

## 2023-04-08 LAB — URINALYSIS, ROUTINE W REFLEX MICROSCOPIC
Bilirubin Urine: NEGATIVE
Glucose, UA: NEGATIVE mg/dL
Hgb urine dipstick: NEGATIVE
Ketones, ur: NEGATIVE mg/dL
Nitrite: NEGATIVE
Protein, ur: NEGATIVE mg/dL
Specific Gravity, Urine: 1.005 (ref 1.005–1.030)
WBC, UA: >50 WBC/hpf (ref 0–5)
pH: 6.5 (ref 5.0–8.0)

## 2023-04-08 LAB — BASIC METABOLIC PANEL
Anion gap: 10 (ref 5–15)
BUN: 11 mg/dL (ref 8–23)
CO2: 25 mmol/L (ref 22–32)
Calcium: 9.7 mg/dL (ref 8.9–10.3)
Chloride: 98 mmol/L (ref 98–111)
Creatinine, Ser: 0.93 mg/dL (ref 0.44–1.00)
GFR, Estimated: 58 mL/min — ABNORMAL LOW (ref >60)
Glucose, Bld: 112 mg/dL — ABNORMAL HIGH (ref 70–99)
Potassium: 3.7 mmol/L (ref 3.5–5.1)
Sodium: 133 mmol/L — ABNORMAL LOW (ref 135–145)

## 2023-04-08 MED ORDER — SODIUM CHLORIDE 0.9 % IV SOLN
1.0000 g | Freq: Once | INTRAVENOUS | Status: AC
  Administered 2023-04-08: 1 g via INTRAVENOUS
  Filled 2023-04-08: qty 10

## 2023-04-08 MED ORDER — CEPHALEXIN 500 MG PO CAPS
500.0000 mg | ORAL_CAPSULE | Freq: Three times a day (TID) | ORAL | 0 refills | Status: DC
  Filled 2023-04-08: qty 20, 7d supply, fill #0

## 2023-04-08 NOTE — ED Provider Notes (Addendum)
[place] EMERGENCY DEPARTMENT AT [facility] Provider Note   CSN: [id number] Arrival date & time: 04/08/23  4696     History  Chief Complaint  Patient presents with  . Palpitations    Vickie Wilson is a 89 y.o. female.  HPI    89 year old female with history of anxiety, depression, hyperlipidemia comes in with chief complaint of palpitations.  Patient accompanied by daughter, provides substantial part of the history.  According to the patient, over the last few weeks she has been having episodes of palpitations.  She has been noticing the episodes, about twice a day.  Symptoms will last anywhere from few minutes to several minutes.  Symptoms have been present even at night, when they have woken her up.  She has no associated chest pain, but has shortness of breath.  According to the daughter, patient also has been more unsteady with her ambulation lately.  Patient was started on Ativan as needed for anxiety.  She has been taking the Ativan over the past few days and states that her shortness of breath and anxiety does respond to it.  Palpitations are described as rapid heartbeat and skipping heartbeat.  There is no history of A-fib.  Home Medications Prior to Admission medications   Medication Sig Start Date End Date Taking? Authorizing Provider  cephALEXin (KEFLEX) 500 MG capsule Take 1 capsule (500 mg total) by mouth 3 (three) times daily. 04/08/23  Yes Derwood Kaplan, MD  acetaminophen (TYLENOL 8 HOUR) 650 MG CR tablet Take 1 tablet (650 mg total) by mouth every 8 (eight) hours as needed for pain. 03/20/20   Plotnikov, Georgina Quint, MD  amLODipine (NORVASC) 5 MG tablet Take 1 tablet (5 mg total) by mouth daily. Annual appt due must see provider for future refills 01/28/23   Plotnikov, Georgina Quint, MD  atenolol (TENORMIN) 100 MG tablet TAKE 1/2 TABLET BY MOUTH TWICE DAILY 12/14/22   Plotnikov, Georgina Quint, MD  Cholecalciferol (VITAMIN D3) 50 MCG (2000 UT) capsule Take 1 capsule (2,000  Units total) by mouth daily. 02/22/19   Plotnikov, Georgina Quint, MD  levETIRAcetam (KEPPRA) 750 MG tablet Take 1 tablet (750 mg total) by mouth 2 (two) times daily. 10/07/22   Ihor Austin, NP  LORazepam (ATIVAN) 0.5 MG tablet Take 1 tablet (0.5 mg total) by mouth 2 (two) times daily as needed for anxiety. Patient not taking: Reported on 03/08/2023 05/01/22   Plotnikov, Georgina Quint, MD  Multiple Vitamins-Minerals (CENTRUM SILVER) CHEW Chew 1 each by mouth 3 (three) times a week.    [provider]  PARoxetine (PAXIL) 20 MG tablet Take 1 tablet (20 mg total) by mouth daily. 05/01/22   Plotnikov, Georgina Quint, MD  rosuvastatin (CRESTOR) 20 MG tablet TAKE 1/2 TABLET BY MOUTH ON Duanne Limerick, [facility] AND FRIDAY 02/09/23   Plotnikov, Georgina Quint, MD      Allergies    Patient has no known allergies.    Review of Systems   Review of Systems  All other systems reviewed and are negative.   Physical Exam Updated Vital Signs BP (!) 164/93   Pulse 87   Temp (!) 97.5 F (36.4 C) (Oral)   Resp 15   Ht 5' (1.524 m)   Wt 45.4 kg   SpO2 98%   BMI 19.53 kg/m  Physical Exam Vitals and nursing note reviewed.  Constitutional:      Appearance: She is well-developed.  HENT:     Head: Atraumatic.  Cardiovascular:     Rate  and Rhythm: Tachycardia present.  Pulmonary:     Effort: Pulmonary effort is normal.  Musculoskeletal:     Cervical back: Normal range of motion and neck supple.  Skin:    General: Skin is warm and dry.  Neurological:     Mental Status: She is alert and oriented to person, place, and time.     ED Results / Procedures / Treatments   Labs (all labs ordered are listed, but only abnormal results are displayed) Labs Reviewed  BASIC METABOLIC PANEL - Abnormal; Notable for the following components:      Result Value   Sodium 133 (*)    Glucose, Bld 112 (*)    GFR, Estimated 58 (*)    All other components within normal limits  URINALYSIS, ROUTINE W REFLEX MICROSCOPIC - Abnormal;  Notable for the following components:   Leukocytes,Ua LARGE (*)    Bacteria, UA RARE (*)    All other components within normal limits  MAGNESIUM  CBC    EKG EKG Interpretation Date/Time:  Thursday April 08 2023 09:45:00 EDT Ventricular Rate:  106 PR Interval:  166 QRS Duration:  81 QT Interval:  334 QTC Calculation: 444 R Axis:   -10  Text Interpretation: Sinus tachycardia Anterior infarct, old No acute changes no arrhythmia noted Confirmed by Derwood Kaplan ([id number]) on 04/08/2023 10:34:44 AM  Radiology DG Chest Port 1 View  Result Date: 04/08/2023 CLINICAL DATA:  Provided history: Palpitations. EXAM: PORTABLE CHEST 1 VIEW COMPARISON:  Prior chest radiograph 12/03/2022 and earlier. FINDINGS: A small portion of the left lateral costophrenic angle is excluded from the field of view. Within this limitation, findings are as follows. Heart size within normal limits. Aortic atherosclerosis. Ill-defined opacity within the lateral left lung base. No appreciable airspace consolidation on the right. No evidence of pleural effusion or pneumothorax. No acute osseous abnormality identified. Chronic, healed fracture deformity of the right sixth rib. IMPRESSION: 1. A small portion of the left lateral costophrenic angle is excluded from the field of view. Within this limitation, findings are as follows. 2. Ill-defined opacity within the lateral left lung base, which may reflect atelectasis or pneumonia. 3. Aortic Atherosclerosis (ICD10-I70.0). Electronically Signed   By: Jackey Loge D.O.   On: 04/08/2023 11:46    Procedures Procedures    Medications Ordered in ED Medications  cefTRIAXone (ROCEPHIN) 1 g in sodium chloride 0.9 % 100 mL IVPB (1 g Intravenous New Bag/Given 04/08/23 1325)    ED Course/ Medical Decision Making/ A&P Clinical Course as of 04/08/23 1345  Thu Apr 08, 2023  1344 Urinalysis, Routine w reflex microscopic -Urine, Clean Catch(!) UA questionable for UTI.  Patient has had UTI in  the past with cultures growing Pseudomonas and E. coli that were pansensitive.  Given that she is mentioning some balance issues, we will probably put her on Keflex for presumed UTI.  Patient does not remember what symptoms of any she had with UTI. [AN]  1344 Per nursing staff, patient is ambulated without significant issues.  Results of the ER workup discussed.  Advised PCP follow-up next week.  Advised utilization of cardiac monitoring if needed.  Telemetry monitoring in the ER has been normal. [AN]    Clinical Course User Index [AN] Derwood Kaplan, MD                             Medical Decision Making Amount and/or Complexity of Data Reviewed Labs: ordered. Radiology: ordered.  Risk Prescription drug management.   89 year old female with history of hypertension, hyperlipidemia, diabetes and remote history of atrial mass comes in with chief complaint of palpitations.  I have reviewed patient's chart, including cardiology visit from 2021 and also reviewed patient's last echocardiogram which was reassuring.  Substantial portion of the history also provided by patient's daughter, was at the bedside.  Differential diagnosis considered for this patient includes PSVT, A-fib/flutter, anxiety, acute coronary syndrome, electrolyte abnormality, medication side effects leading to balance issues, UTI.  Will place patient on cardiac telemetry monitoring. We will get basic labs and urine analysis. We will also ambulate the patient.  Patient has expressed her frustration with her daughter and the lack of attention she receives from her.  There is also discussion whether patient needs to go to an assisted living facility.  I have requested that for the current visit, we will focus on her symptoms and we will be happy to put in home health orders if they think that will be helpful.  Final Clinical Impression(s) / ED Diagnoses Final diagnoses:  Palpitations  Acute cystitis without hematuria   Ataxia    Rx / DC Orders ED Discharge Orders          Ordered    cephALEXin (KEFLEX) 500 MG capsule  3 times daily        04/08/23 1340    Home Health        04/08/23 1341    Face-to-face encounter (required for Medicare/Medicaid patients)       Comments: I Blu Lori certify that this patient is under my care and that I, or a nurse practitioner or physician's assistant working with me, had a face-to-face encounter that meets the physician face-to-face encounter requirements with this patient on 04/08/2023. The encounter with the patient was in whole, or in part for the following medical condition(s) which is the primary reason for home health care (List medical condition): Patient resides at home.  More recently having balance issues, cognitive decline.   04/08/23 1341              Derwood Kaplan, MD 04/08/23 1152    Derwood Kaplan, MD 04/08/23 1345

## 2023-04-08 NOTE — Discharge Instructions (Addendum)
You are seen in the emergency room for palpitations, weakness and balance issues.  Our workup in the emergency room reveals findings concerning for bladder infection.  We are prescribing you antibiotics for it.  Please ensure that you are taking your medications as prescribed.  Try to see if the balance is worse the days that you take Ativan.  Ativan can cause you to have balance issues, and if it is the culprit then it should be discontinued.  It is unclear why you are having palpitations.  The cardiac monitoring in the emergency room is reassuring.  Discuss with Dr. Posey Rea if he thinks you need a cardiac event monitor if your symptoms continue.  We have ordered home health at your request.  We expect home health team to connect with you to start their evaluation.  If they have not reached out to you, bring this request up to Dr. Posey Rea again.

## 2023-04-08 NOTE — ED Triage Notes (Signed)
Pt arrives via POV with daughter. Pt reports she took some ativan this morning and she can take it as needed twice a day. Reports occasionally she will feel her heart race. Daughter reports mental decline over past month and trouble walking over past 2 days. Denies CP or SOB.

## 2023-04-13 ENCOUNTER — Telehealth: Payer: Self-pay | Admitting: *Deleted

## 2023-04-13 NOTE — Telephone Encounter (Signed)
Transition Care Management Unsuccessful Follow-up Telephone Call  Date of discharge and from where:  [facility] 04/08/2023  Attempts:  1st Attempt  Reason for unsuccessful TCM follow-up call : patient declined

## 2023-04-14 ENCOUNTER — Ambulatory Visit (INDEPENDENT_AMBULATORY_CARE_PROVIDER_SITE_OTHER): Payer: Medicare Other | Admitting: Internal Medicine

## 2023-04-14 ENCOUNTER — Encounter: Payer: Self-pay | Admitting: Internal Medicine

## 2023-04-14 ENCOUNTER — Encounter (INDEPENDENT_AMBULATORY_CARE_PROVIDER_SITE_OTHER): Payer: Self-pay

## 2023-04-14 VITALS — BP 140/92 | HR 72 | Temp 98.7°F | Ht 60.0 in | Wt 99.0 lb

## 2023-04-14 DIAGNOSIS — I10 Essential (primary) hypertension: Secondary | ICD-10-CM

## 2023-04-14 DIAGNOSIS — R451 Restlessness and agitation: Secondary | ICD-10-CM | POA: Diagnosis not present

## 2023-04-14 DIAGNOSIS — R002 Palpitations: Secondary | ICD-10-CM

## 2023-04-14 DIAGNOSIS — E119 Type 2 diabetes mellitus without complications: Secondary | ICD-10-CM | POA: Diagnosis not present

## 2023-04-14 DIAGNOSIS — F03911 Unspecified dementia, unspecified severity, with agitation: Secondary | ICD-10-CM

## 2023-04-14 DIAGNOSIS — R410 Disorientation, unspecified: Secondary | ICD-10-CM

## 2023-04-14 MED ORDER — METOPROLOL SUCCINATE ER 25 MG PO TB24: 25.0000 mg | ORAL_TABLET | Freq: Every day | ORAL | 5 refills | Status: DC

## 2023-04-14 MED ORDER — ARIPIPRAZOLE 2 MG PO TABS: 2.0000 mg | ORAL_TABLET | Freq: Every day | ORAL | 3 refills | Status: DC

## 2023-04-14 NOTE — Progress Notes (Addendum)
Subjective:  Patient ID: Vickie Wilson, female    DOB: 04/11/1931  Age: 89 y.o. MRN: [id number]  CC: Follow-up (Palpitations comes and last several hours keeping pt awake at night, off balance, needs to discuss medication, memory lost, increase agitation, decrease in appetite and very sad daily with multiple crying episodes a day. )   HPI Vickie Wilson presents for palpitations and behavioral changes.  She has been agitated, calling neighbors, family, making threats. She is here with her daughter Vickie Wilson.  Vickie Wilson has not been taking any meds Pt had a UTI recently C/o worsening memory problems, forgetfulness, being upset a lot. Her symptoms has been getting worse over past 6 months, much worse lately. She was seen in the emergency room on 04/08/2023 for palpitations.  She was also found to have a urinary tract infection there cystitis.   Outpatient Medications Prior to Visit  Medication Sig Dispense Refill   acetaminophen (TYLENOL 8 HOUR) 650 MG CR tablet Take 1 tablet (650 mg total) by mouth every 8 (eight) hours as needed for pain. (Patient not taking: Reported on 04/29/2023) 100 tablet 3   Cholecalciferol (VITAMIN D3) 50 MCG (2000 UT) capsule Take 1 capsule (2,000 Units total) by mouth daily. 100 capsule 3   levETIRAcetam (KEPPRA) 750 MG tablet Take 1 tablet (750 mg total) by mouth 2 (two) times daily. 180 tablet 3   Multiple Vitamins-Minerals (CENTRUM SILVER) CHEW Chew 1 each by mouth 3 (three) times a week.     amLODipine (NORVASC) 5 MG tablet Take 1 tablet (5 mg total) by mouth daily. Annual appt due must see provider for future refills 30 tablet 11   atenolol (TENORMIN) 100 MG tablet TAKE 1/2 TABLET BY MOUTH TWICE DAILY 45 tablet 3   cephALEXin (KEFLEX) 500 MG capsule Take 1 capsule (500 mg total) by mouth 3 (three) times daily. 20 capsule 0   PARoxetine (PAXIL) 20 MG tablet Take 1 tablet (20 mg total) by mouth daily. 90 tablet 1   rosuvastatin (CRESTOR) 20 MG tablet TAKE 1/2  TABLET BY MOUTH ON MONDAY, WEDNESDAY AND FRIDAY 30 tablet 0   LORazepam (ATIVAN) 0.5 MG tablet Take 1 tablet (0.5 mg total) by mouth 2 (two) times daily as needed for anxiety. (Patient not taking: Reported on 04/29/2023) 60 tablet 1   No facility-administered medications prior to visit.    ROS: Review of Systems  Constitutional:  Negative for activity change, appetite change, chills, fatigue and unexpected weight change.  HENT:  Negative for congestion, mouth sores and sinus pressure.   Eyes:  Negative for visual disturbance.  Respiratory:  Negative for cough and chest tightness.   Gastrointestinal:  Negative for abdominal pain and nausea.  Genitourinary:  Negative for difficulty urinating, frequency and vaginal pain.  Musculoskeletal:  Negative for back pain and gait problem.  Skin:  Negative for pallor and rash.  Neurological:  Negative for dizziness, tremors, weakness, light-headedness, numbness and headaches.  Hematological:  Does not bruise/bleed easily.  Psychiatric/Behavioral:  Positive for agitation, behavioral problems, decreased concentration and dysphoric mood. Negative for confusion, sleep disturbance and suicidal ideas. The patient is nervous/anxious.     Objective:  BP (!) 140/92 (BP Location: Right Arm, Patient Position: Sitting, Cuff Size: Normal)   Pulse 72   Temp 98.7 F (37.1 C) (Oral)   Ht 5' (1.524 m)   Wt 99 lb (44.9 kg)   SpO2 98%   BMI 19.33 kg/m   BP Readings from Last 3 Encounters:  04/29/23  120/68  04/21/23 (!) 152/76  04/14/23 (!) 140/92    Wt Readings from Last 3 Encounters:  04/29/23 97 lb 6 oz (44.2 kg)  04/21/23 99 lb 9.6 oz (45.2 kg)  04/14/23 99 lb (44.9 kg)    Physical Exam Constitutional:      General: She is not in acute distress.    Appearance: Normal appearance. She is well-developed.  HENT:     Head: Normocephalic.     Right Ear: External ear normal.     Left Ear: External ear normal.     Nose: Nose normal.  Eyes:      General:        Right eye: No discharge.        Left eye: No discharge.     Conjunctiva/sclera: Conjunctivae normal.     Pupils: Pupils are equal, round, and reactive to light.  Neck:     Thyroid: No thyromegaly.     Vascular: No JVD.     Trachea: No tracheal deviation.  Cardiovascular:     Rate and Rhythm: Normal rate and regular rhythm.     Heart sounds: Normal heart sounds.  Pulmonary:     Effort: No respiratory distress.     Breath sounds: No stridor. No wheezing.  Abdominal:     General: Bowel sounds are normal. There is no distension.     Palpations: Abdomen is soft. There is no mass.     Tenderness: There is no abdominal tenderness. There is no guarding or rebound.  Musculoskeletal:        General: No tenderness.     Cervical back: Normal range of motion and neck supple. No rigidity.  Lymphadenopathy:     Cervical: No cervical adenopathy.  Skin:    Findings: No erythema or rash.  Neurological:     Mental Status: She is oriented to person, place, and time.     Cranial Nerves: No cranial nerve deficit.     Motor: No abnormal muscle tone.     Coordination: Coordination normal.     Deep Tendon Reflexes: Reflexes normal.  Psychiatric:        Behavior: Behavior normal.        Thought Content: Thought content normal.        Judgment: Judgment normal.   Rupal is openly hostile towards [name], screaming insults. Failing MMSE - confused, agitated  Lab Results  Component Value Date   WBC 6.2 04/08/2023   HGB 13.9 04/08/2023   HCT 41.4 04/08/2023   PLT 326 04/08/2023   GLUCOSE 112 (H) 04/08/2023   CHOL 279 (H) 12/29/2021   TRIG 174.0 (H) 12/29/2021   HDL 56.50 12/29/2021   LDLDIRECT 125.6 01/16/2013   LDLCALC 188 (H) 12/29/2021   ALT 12 12/29/2021   AST 23 12/29/2021   Wilson 133 (L) 04/08/2023   K 3.7 04/08/2023   CL 98 04/08/2023   CREATININE 0.93 04/08/2023   BUN 11 04/08/2023   CO2 25 04/08/2023   TSH 2.74 12/29/2021   INR 0.92 04/22/2018   HGBA1C 6.1  08/22/2019   MICROALBUR 2.8 (H) 09/17/2016    DG Chest Port 1 View  Result Date: 04/08/2023 CLINICAL DATA:  Provided history: Palpitations. EXAM: PORTABLE CHEST 1 VIEW COMPARISON:  Prior chest radiograph 12/03/2022 and earlier. FINDINGS: A small portion of the left lateral costophrenic angle is excluded from the field of view. Within this limitation, findings are as follows. Heart size within normal limits. Aortic atherosclerosis. Ill-defined opacity within the lateral left lung  base. No appreciable airspace consolidation on the right. No evidence of pleural effusion or pneumothorax. No acute osseous abnormality identified. Chronic, healed fracture deformity of the right sixth rib. IMPRESSION: 1. A small portion of the left lateral costophrenic angle is excluded from the field of view. Within this limitation, findings are as follows. 2. Ill-defined opacity within the lateral left lung base, which may reflect atelectasis or pneumonia. 3. Aortic Atherosclerosis (ICD10-I70.0). Electronically Signed   By: Jackey Loge D.O.   On: 04/08/2023 11:46    Assessment & Plan:   Problem List Items Addressed This Visit     Diabetic on diet only (HCC)    Continue to monitor A1c/glucose      HTN (hypertension)    Noncompliance due to behavioral changes Start on Toprol XL      Relevant Medications   metoprolol succinate (TOPROL-XL) 25 MG 24 hr tablet   Agitation - Primary    New behavioral changes.  She has been agitated, calling neighbors, family, making threats. She is here with her daughter Vickie Wilson.  Gittel has not been taking any meds Pt had a UTI recently C/o worsening memory problems, forgetfulness, being upset a lot. Her symptoms has been getting worse over past 6 months, much worse lately. She was seen in the emergency room on 04/08/2023 for palpitations.  She was also found to have a urinary tract infection there cystitis.  Ranay is openly hostile towards [name], screaming  insults. Dementia with agitation and confusion, likely Alzheimer's disease. Start Abilify at low-dose. We placed a home health care referral Go to ER if worse      Relevant Orders   Ambulatory referral to Home Health   Confusion    New behavioral changes with confusion/psychotic features.  She has been agitated, calling neighbors, family, making threats. She is here with her daughter Vickie Wilson.  Ninive has not been taking any meds Pt had a UTI recently C/o worsening memory problems, forgetfulness, being upset a lot. Her symptoms has been getting worse over past 6 months, much worse lately. She was seen in the emergency room on 04/08/2023 for palpitations.  She was also found to have a urinary tract infection there cystitis.  Amyla is openly hostile towards [name], screaming insults. Dementia with agitation and confusion, likely Alzheimer's disease. Start Abilify at low-dose. We placed a home health care referral Go to ER if worse      Relevant Orders   Ambulatory referral to Home Health   Palpitations    Chronic.  Noncompliance with treatment due to behavioral changes Start on Toprol XL      Other Visit Diagnoses     Agitation due to dementia [facility])       Relevant Orders   Ambulatory referral to Home Health         Meds ordered this encounter  Medications   ARIPiprazole (ABILIFY) 2 MG tablet    Sig: Take 1 tablet (2 mg total) by mouth daily.    Dispense:  30 tablet    Refill:  3   metoprolol succinate (TOPROL-XL) 25 MG 24 hr tablet    Sig: Take 1 tablet (25 mg total) by mouth daily.    Dispense:  30 tablet    Refill:  5      Follow-up: Return in about 2 weeks (around 04/28/2023) for a follow-up visit.  Sonda Primes, MD

## 2023-04-15 ENCOUNTER — Ambulatory Visit (INDEPENDENT_AMBULATORY_CARE_PROVIDER_SITE_OTHER): Payer: Medicare Other | Admitting: Psychiatry

## 2023-04-15 ENCOUNTER — Encounter: Payer: Self-pay | Admitting: Psychiatry

## 2023-04-15 DIAGNOSIS — F320 Major depressive disorder, single episode, mild: Secondary | ICD-10-CM

## 2023-04-15 NOTE — Progress Notes (Signed)
[facility] Counselor Initial Adult Exam  Name: HAZELYN BACANI Date: 04/15/2023 MRN: [id number] DOB: 02/13/1931 PCP: Tresa Garter, MD  Time spent: 55 minutes  Guardian/Payee:  n/a    Paperwork requested:  No   Reason for Visit /Presenting Problem: depression, anxiety,   Mental Status Exam:    Appearance:   Casual and Well Groomed     Behavior:  Appropriate, Sharing, and Motivated  Motor:  Shows occasional instability in walking   Speech/Language:   Clear and Coherent  Affect:  Depressed and anxious  Mood:  anxious and depressed  Thought process:  goal directed  Thought content:    Rumination and obsessive thoughts  Sensory/Perceptual disturbances:    WNL  Orientation:  oriented to person, place, time/date, situation, day of week, month of year, year, and stated date of Aug. 1, 2024  Attention:  Fair  Concentration:  Good and Fair  Memory:  Report some memory issues and Dr is aware  Fund of knowledge:   Good  Insight:    Good and Fair  Judgment:   Good  Impulse Control:  Good   Reported Symptoms:  see above  Risk Assessment: Danger to Self:  No Self-injurious Behavior: No Danger to Others: No Duty to Warn:no Physical Aggression / Violence:No  Access to Firearms a concern: No  Gang Involvement:No  Patient / guardian was educated about steps to take if suicide or homicide risk level increases between visits: Denies any SI or HI. While future psychiatric events cannot be accurately predicted, the patient does not currently require acute inpatient psychiatric care and does not currently meet [facility] involuntary commitment criteria.  Substance Abuse History: Current substance abuse: No     Past Psychiatric History:   No previous psychological problems have been observed Outpatient Providers:primary care Dr History of Psych Hospitalization: No  Psychological Testing: none   Abuse History: Victim of No., n/a   Report needed: No. Victim of  Neglect:No. Perpetrator of n/a  Witness / Exposure to Domestic Violence: No   Protective Services Involvement: No  Witness to [employer] Violence:  No   Family History: Patient confirms info below. Family History  Problem Relation Age of Onset   Heart disease Mother    Alcohol abuse Father    Heart disease Father    Hyperlipidemia Other    Hypertension Other    Stroke Other     Living situation: the patient (89 yrs old and lives alone; husband and she divorce"years ago".  Daughter, in her 61's, lives locally. Patient still lives in her own home and has good relationships  with neighbors. Some conflict as she states "family is wanting her house and me go to assisted living ".  Sexual Orientation:  Straight  Relationship Status: divorced  Name of spouse / other: n/a              If a parent, number of children / ages:1 daughter and 1 grand-daughter "but she doesn't really contact me now"  Support Systems; friends lives alone  Financial Stress:  "Is able to pay bills, but a bit tight at times but I may just be worrying."  Income/Employment/Disability: Neurosurgeon: No   Educational History: Education: high school diploma/GED  Religion/Sprituality/World View:   Protestant  Any cultural differences that may affect / interfere with treatment:  not applicable   Recreation/Hobbies: talking with friends, books  Stressors:Other: reports no significant health problems, but Dr Janae Bridgeman be wanting me to stop driving  Strengths:  Supportive Relationships, Family, Friends, Church, Spirituality, Hopefulness, Journalist, newspaper, and Able to Communicate Effectively  Barriers:  states "I'm not sure"   Legal History: Pending legal issue / charges: The patient has no significant history of legal issues. History of legal issue / charges: none  Medical History/Surgical History:reviewed and patient confirms info below. Past Medical History:  Diagnosis Date    Allergy    Anxiety    Atrial mass    right   Constipation    Depression    Diabetes mellitus    Hyperlipidemia    Hypertension    Osteoporosis    Seizures (HCC)     Past Surgical History:  Procedure Laterality Date   DILATION AND CURETTAGE OF UTERUS  1987    Medications: Current Outpatient Medications  Medication Sig Dispense Refill   acetaminophen (TYLENOL 8 HOUR) 650 MG CR tablet Take 1 tablet (650 mg total) by mouth every 8 (eight) hours as needed for pain. 100 tablet 3   ARIPiprazole (ABILIFY) 2 MG tablet Take 1 tablet (2 mg total) by mouth daily. 30 tablet 3   Cholecalciferol (VITAMIN D3) 50 MCG (2000 UT) capsule Take 1 capsule (2,000 Units total) by mouth daily. 100 capsule 3   levETIRAcetam (KEPPRA) 750 MG tablet Take 1 tablet (750 mg total) by mouth 2 (two) times daily. 180 tablet 3   LORazepam (ATIVAN) 0.5 MG tablet Take 1 tablet (0.5 mg total) by mouth 2 (two) times daily as needed for anxiety. (Patient not taking: Reported on 03/08/2023) 60 tablet 1   metoprolol succinate (TOPROL-XL) 25 MG 24 hr tablet Take 1 tablet (25 mg total) by mouth daily. 30 tablet 5   Multiple Vitamins-Minerals (CENTRUM SILVER) CHEW Chew 1 each by mouth 3 (three) times a week.     No current facility-administered medications for this visit.    No Known Allergies  Diagnoses:    ICD-10-CM   1. Major depressive disorder, single episode, mild (HCC)  F32.0      Treatment goal plan of care: Worked with this patient collaboratively on her treatment plan and she is in agreement with it.  Her goals will remain on treatment plan as patient works with strategies in sessions and outside of sessions to meet her goals.  Progress is assessed each session and documented in the "plan" or "progress" sections of treatment note. Elevate mood and show evidence of usual energy, activities, and socialization level. Engage in physical and social activities that reflect increased energy and interest. Verbalize  hopeful and positive statements regarding the future.  Plan of Care:   Today is the first appointment for this patient with this therapist and we collaboratively completed her initial evaluation and initial treatment goal plan.  Lemya Stdenis is a 89 year old female patient who lives alone in her home after divorcing "years ago". Reports neighbors are very "good with me". Currently still driving but states Dr "may not want me to keep driving long".  Denies any significant problems with her driving but seems to have an attitude of respecting what her doctor suggests.  States she's never driven in familiar places and gotten lost. States that "every day I have contact with someone, either a neighbor or friend or family member. Some strained feelings between patient and family, but seems to have good relationships with neighbors and friends. States family doesn't usually include her in social situations "other than an hour on Christmas and Thanksgiving." Does feel some closeness to daughter but limited and there are trust  issues. States that she sees friends regularly, some of whom frequently take her out to lunch. Born in [place] and eventually moved to [place] with her husband when in her 55's. Reports "basically good health and have never had any serious illnesses." States she stays in contact with her medical providers as needed. Reports that she came today to [facility] today to get help with her depression and anxiety.  Worries about her relationship and issues with her immediate family as she has the feeling that her "daughter and daughter's husband are wanting my house and that they want me to go to assisted living".  Seem to have a good bit of sadness about this although brief moments of some tearfulness.  Denies that she has strong nor frequent tearfulness.  Denies significant medical issues.  Denied any significant memory issues.  Did admit that occasionally she may have to think of what words she is  trying to use but other than that she does not feel she has any significant memory concerns.  She is oriented to person/place/time/date/situation/day of week/month of year/and correct year.  She was casually and neatly dressed, behavior was appropriate and seemed motivated, a couple of slight sidesteps in her walking but did not indicate she was stumbling more likely to fall, her speech and language was clear and coherent, her affect expressed anxiety and depression although she denied that it was severe and it did not appear to be during session today, mood was anxious and depressed, thought content included some obsessive thoughts and ruminating, her insight seemed good on most things and fair on others, her attention was fair, concentration good/fair, did report some memory issues and that her doctor was aware of them, Insight was good/fair, judgment and impulse control were both good.  She indicated no thoughts to harm herself nor anyone else.  Patient added that she loves living in her home and loves her neighbors as she stated several of them check on her very frequently.  Reports her daughter, husband, and a granddaughter live locally and have contact although she adds that she really does not have contact with her 1 granddaughter very much at this point.  She does worry sometimes about financial stressors but then says "I am able to pay my bills okay but sometimes I worry anyway".  She receives [employer] retirement and was not real clear on if there were other sources of income.  As far as any recreational interests, patient states she enjoys talking with her friends and that she used to read as she has a lot of books but does not read is much currently.  She reports no other significant health problems.  Her strengths include supportive relationships, family, friends, her church, hopefulness, able to be a self advocate, and able to communicate effectively.  Patient did give a lot of good information  today that seems to be accurate although at times needed to have something repeated or seem to misunderstand something but soon realized it seemed to be more of a hearing issue than an understanding issue.  Patient did state that she is thinking that she might need hearing aids, and is considering following up on this.  Overall she was a very pleasant lady and did seem to be dealing with some stress, anxiety, and depression regarding some aging challenges and more so in reference to issues within the family that related to patient's home and patient's ability to remain in her home.  Patient is to be seen again within  2 weeks to work on some strategies to help her in coping with her depression and anxiety.   Review of initial goals and patient reports being motivated.  Next appointment within 2 weeks.   Mathis Fare, LCSW

## 2023-04-20 NOTE — Progress Notes (Unsigned)
[facility] [address] [place]. [place] [address] [phone]       OFFICE FOLLOW UP NOTE  Vickie Wilson Date of Birth:  1930-12-25 Medical Record Number:  [id number]   Referring MD:  Vickie Wilson  Reason for Referral:  seizure   No chief complaint on file.    HPI  Update 04/21/2023 [name] being seen today for sooner visit per patient request due to left-sided head pain which started around May.     Does have ongoing issues with anxiety/depression, currently on Abilify and Ativan by PCP, recently establish care with behavioral health.      History provided for reference purposes only Update 10/07/2022 [name] returns for yearly seizure follow-up unaccompanied. Stable, denies any seizure activity over the past year.  Remains on Keppra 750mg  BID, tolerating well.  Maintain ADLs and IADLs independently.  She continues to struggle with depression/anxiety, she is scheduled to be seen by behavioral health for initial evaluation tomorrow which she is looking forward to.  No questions or concerns today  Update 10/07/2021 JM: Returns for 13-month seizure follow-up unaccompanied. Doing well on keppra 750mg  twice daily - denies side effects. No seizure activity. Tried to decrease dose but did not feel well (felt shaking and unwell) therefore increased dose back with resolution of symptoms. Lives alone.  Maintains maintains ADLs and IADLs independently. Reports frustration due to limited help from daughter. She feels isolated and depressed. Denies SI/HI. Reports her daughter was supposed to bring her today but due to work, unable to bring her - she drove herself and had difficulty finding office - her car broke down in near by parking lot and walked over to office. Is fearful she will end up in a nursing home as at times she will have difficulty maintaining all IADLs. No further concerns at this time.   Update 03/27/2021 JM: Vickie Wilson returns for yearly seizure  follow-up.  Stable since prior visit without recurrent seizure activity.  Remains on Keppra without side effects.  She does question potentially lowering dosage as she has not had any seizure activity over the past 3 years.  She remains active maintaining all ADLs and IADLs independently.  No further concerns at this time.  Update 03/27/2020 JM: Vickie Wilson returns for seizure follow-up.  She has been doing well since prior visit without recurrent seizure activity or events.  Remains on Keppra 750 mg twice daily tolerating dosage without side effects.  Currently following with PCP for lower extremity pains and fatigue with Crestor currently on hold for possible medication side effect.  She does have follow-up in the near future.  Follows with cardiology regularly.  Blood pressure stable at 130/70.  No concerns at this time.  Update 03/27/2019 [name]: She returns for follow-up after last visit in December 2019 with [name], nurse practitioner.  She is doing well and has not had any recurrent episodes of sensory seizures or any other neurological issues.  She remains on Keppra 750 mg twice daily which she is tolerating well without dizziness or other side effects.  She woke up 1 day about a month ago with feeling of coldness and heaviness in both her feet but this does not last long and cleared in an hour or 2.  She did not seek medical help.  She saw her primary care physician few weeks ago who did some lab work all of which was satisfactory.  She states her blood pressure usually runs good but today it  is slightly elevated in our office and 155/80 she feels this may be due to white coat hypertension.  She has no new complaints today.  Interval history 08/30/2018 [name] is being seen today for 87-month follow-up visit.  She did undergo repeat EEG which was normal without evidence of seizure activity.  She did increase keppra after prior appointment.  Approximately 3 to 4 weeks ago, patient did experience  episode of left hand numbness which lasted approximately 15 seconds and then resolved.  She denies any additional episode occurring.  She does endorse fatigue during the day and tiring quicker than before but she is unsure if this is due to medications versus old age.  She does experience some unsteadiness at times but was also experiencing this prior to her [facility] and being on keppra.  She did undergo a couple physical therapy sessions but financially was unable to continue.  She did receive handout regarding exercises to do at home to assist with balance but she does admit to not doing this.  She does state that she consistently stays busy as she does live independently and manages all ADLs and IADLs.  She is able to continue to ambulate without assistive device and denies any recent falls.  She also states that intermittently she will have a burning sensation/sharp pinprick sensation on her left middle finger knuckle and left AC area which may only last for 1 to 2 minutes and then subside.  She believes that this pain could be due to arthritis and is not overly concerned as it happens infrequently.  Pressure today 141/80.  She continues on Crestor for HLD management.  No further concerns at this time.  Denies new or worsening stroke/TIA symptoms or seizure activity.   initial visit 05/26/2018 [name] is a pleasant 14 year Caucasian lady seen today for initial consultation visit following hospital admission recently for paresthesias. History is provided by the patient and review of electronic medical records. I personally reviewed imaging films.Vickie Wilson is an 89 y.o. female past medical history of hypertension, hyperlipidemia, diabetes mellitus, atrial mass who presents to the emergency room on 04/22/18 after having multiple spells of numbness over the left hand extending to the arm as well as left facial numbness.She stated her first episode was about 1 week ago lasting about 15 minutes mainly if he  affecting her left hand, arm as well as the left side of her face. Resolved spontaneously.  Continued to have 3 more episodes and therefore today she decided to come to the hospital.  She denies any weakness, however states she was not able to use her hand like she normally could.  Patient denies any jerking/twitching movements of the left arm or face.  Work-up in the ER included MRI brain which revealed a small area of subarachnoid hemorrhage on MRI brain in the right frontal lobe convexity only. This was difficult to visualize on the CT scan corresponding cuts..  Her CT scan was   negative h .  She denied any history of recent head trauma, however she had a fall few weeks ago. She was on ASA at home. EEG done on 04/23/18 was normal. Transthoracic echo showed normal ejection fraction. Patient was started on Keppra 500 twice daily and had no further episodes after that was started. 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 Neurosurgery was consulted and recommended conservative follow-up  for the same. Patient states she was doing well after discharge until 5 days ago when she had 2 transient episodes of numbness in her left hand gradually going up to her left elbow but this time not involving her face. This occurred twice on one day and then once the next day. She has not had these episodes for the last 3 days at least. She remains on Keppra 500 twice daily which is tolerating well without any side effects. She states she has not missed any doses.     ROS:   14 system review of systems is positive for those listed in HPI and all other systems negative  PMH:  Past Medical History:  Diagnosis Date   Allergy    Anxiety    Atrial mass    right   Constipation    Depression    Diabetes mellitus    Hyperlipidemia    Hypertension    Osteoporosis    Seizures (HCC)     Social History:   Social History   Socioeconomic History   Marital status: Single    Spouse name: Not on file   Number of children: 1   Years of education: Not on file   Highest education level: 12th grade  Occupational History   Occupation: retired  Tobacco Use   Smoking status: Former    Current packs/day: 0.00    Average packs/day: 1 pack/day for 20.0 years (20.0 ttl pk-yrs)    Types: Cigarettes    Start date: 03/13/1978    Quit date: 03/13/1998    Years since quitting: 25.1   Smokeless tobacco: Never  Vaping Use   Vaping status: Never Used  Substance and Sexual Activity   Alcohol use: No   Drug use: No   Sexual activity: Not Currently  Other Topics Concern   Not on file  Social History Narrative   Regular exercise-yes      Daily caffeine use      Right handed       03/27/21 Lives at home alone    Social Determinants of Health   Financial Resource Strain: Low Risk  (08/18/2022)   Overall Financial Resource Strain (CARDIA)    Difficulty of Paying Living Expenses: Not hard at all  Food Insecurity: No Food Insecurity (08/18/2022)   Hunger Vital Sign    Worried About Running Out of Food in the Last Year: Never true    Ran Out of Food in the Last Year: Never true  Transportation Needs: No Transportation Needs (08/18/2022)   PRAPARE - Administrator, Civil Service (Medical): No    Lack of Transportation (Non-Medical): No  Physical Activity: Inactive (08/18/2022)   Exercise Vital Sign    Days of Exercise per Week: 0 days    Minutes of Exercise per Session: 0 min  Stress: No Stress Concern Present (08/18/2022)   [employer] of Occupational Health - Occupational Stress Questionnaire    Feeling of Stress : Only a little  Social Connections: Moderately Integrated (08/18/2022)   Social Connection and Isolation Panel [NHANES]    Frequency of Communication with Friends and Family: More than three times a week    Frequency of Social Gatherings with Friends and Family: More than  three times a week    Attends Religious Services: More than 4 times per year    Active Member of [employer] or Organizations: Yes    Attends Banker Meetings: More than 4 times per year    Marital  Status: Widowed  Intimate Partner Violence: Not At Risk (08/18/2022)   Humiliation, Afraid, Rape, and Kick questionnaire    Fear of Current or Ex-Partner: No    Emotionally Abused: No    Physically Abused: No    Sexually Abused: No    Medications:   Current Outpatient Medications on File Prior to Visit  Medication Sig Dispense Refill   acetaminophen (TYLENOL 8 HOUR) 650 MG CR tablet Take 1 tablet (650 mg total) by mouth every 8 (eight) hours as needed for pain. 100 tablet 3   ARIPiprazole (ABILIFY) 2 MG tablet Take 1 tablet (2 mg total) by mouth daily. 30 tablet 3   Cholecalciferol (VITAMIN D3) 50 MCG (2000 UT) capsule Take 1 capsule (2,000 Units total) by mouth daily. 100 capsule 3   levETIRAcetam (KEPPRA) 750 MG tablet Take 1 tablet (750 mg total) by mouth 2 (two) times daily. 180 tablet 3   LORazepam (ATIVAN) 0.5 MG tablet Take 1 tablet (0.5 mg total) by mouth 2 (two) times daily as needed for anxiety. (Patient not taking: Reported on 03/08/2023) 60 tablet 1   metoprolol succinate (TOPROL-XL) 25 MG 24 hr tablet Take 1 tablet (25 mg total) by mouth daily. 30 tablet 5   Multiple Vitamins-Minerals (CENTRUM SILVER) CHEW Chew 1 each by mouth 3 (three) times a week.     No current facility-administered medications on file prior to visit.    Allergies:   No Known Allergies    Vitals There were no vitals filed for this visit.   There is no height or weight on file to calculate BMI.  Physical Exam General: frail petite very pleasant elderly Caucasian lady, seated, in no evident distress Head: head normocephalic and atraumatic.   Neck: supple with no carotid or supraclavicular bruits Cardiovascular: regular rate and rhythm, no murmurs Musculoskeletal: mild kyphosis Skin:  no  rash/petichiae Vascular:  Normal pulses all extremities  Neurologic Exam Mental Status: Awake and fully alert.  Fluent speech and language.  Oriented to place and time. Recent and remote memory intact. Attention span, concentration and fund of knowledge appropriate. Mood and affect appropriate.  Cranial Nerves: Pupils equal, briskly reactive to light. Extraocular movements full without nystagmus. Visual fields full to confrontation. Hearing diminished bilaterally. Facial sensation intact. Face, tongue, palate moves normally and symmetrically.  Motor: Normal bulk and tone. Normal strength in all tested extremity muscles. Sensory.: intact to touch , pinprick , position and vibratory sensation.  Coordination: Rapid alternating movements normal in all extremities. Finger-to-nose and heel-to-shin performed accurately bilaterally. Gait and Station: Arises from chair without difficulty. Stance is normal. Gait demonstrates normal stride length and balance without use of assistive device.  Unable to heel, toe and tandem walk due to difficulty Reflexes: 1+ and symmetric. Toes downgoing.       ASSESSMENT: 89 year old lady with recurrent fleeting left arm and face paresthesias likely simple partial seizures likely symptomatic from small localized right frontal convexity subarachnoid hemorrhage which is non-aneurysmal and non-traumatic in etiology.     PLAN:  -Continue Keppra 750 mg twice daily - previously attempted to lower dose but felt "shaky and ill" therefore increased dose back with resolution of symptoms - refill provided -Advised to call with any seizure activity -is scheduled to see [facility] tomorrow for anxiety/depression    Follow-up in 1 year or call earlier if needed    CC:  Plotnikov, Georgina Quint, MD     I spent 24 minutes of face-to-face and non-face-to-face time with patient.  This included  previsit chart review, lab review, study review, order entry, electronic health record  documentation, patient education and discussion regarding the above and answered all the questions to patient's satisfaction  Ihor Austin, [facility]  [facility] [address] [place], [place] [address]  Phone [phone] Fax [phone] Note: This document was prepared with digital dictation and possible smart phrase technology. Any transcriptional errors that result from this process are unintentional.

## 2023-04-21 ENCOUNTER — Ambulatory Visit: Payer: Medicare Other | Admitting: Adult Health

## 2023-04-21 ENCOUNTER — Encounter: Payer: Self-pay | Admitting: Adult Health

## 2023-04-21 VITALS — BP 152/76 | HR 57 | Ht 60.0 in | Wt 99.6 lb

## 2023-04-21 DIAGNOSIS — F418 Other specified anxiety disorders: Secondary | ICD-10-CM | POA: Diagnosis not present

## 2023-04-21 DIAGNOSIS — Z7409 Other reduced mobility: Secondary | ICD-10-CM | POA: Diagnosis not present

## 2023-04-21 DIAGNOSIS — G40109 Localization-related (focal) (partial) symptomatic epilepsy and epileptic syndromes with simple partial seizures, not intractable, without status epilepticus: Secondary | ICD-10-CM | POA: Diagnosis not present

## 2023-04-21 NOTE — Patient Instructions (Addendum)
Your Plan:  Continue Keppra 750mg  twice daily for seizure prevention - this medication can cause mood side effects but you also appear to have clear reasons for feeling like you do. I would recommend you continue to work with your psychology for hopeful benefit but if you continue to struggle by your follow up visit, can consider switching medications that may come with less mood side effects - this can also be discussed with your psychologist for their input  Please let me know if left sided head symptoms should return     Follow up as scheduled in January or call earlier if needed      Thank you for coming to see [place] at [facility]. I hope we have been able to provide you high quality care today.  You may receive a patient satisfaction survey over the next few weeks. We would appreciate your feedback and comments so that we may continue to improve ourselves and the health of our patients.

## 2023-04-27 ENCOUNTER — Ambulatory Visit: Payer: Medicare Other | Admitting: Professional Counselor

## 2023-04-29 ENCOUNTER — Ambulatory Visit (INDEPENDENT_AMBULATORY_CARE_PROVIDER_SITE_OTHER): Payer: Medicare Other | Admitting: Internal Medicine

## 2023-04-29 ENCOUNTER — Encounter: Payer: Self-pay | Admitting: Internal Medicine

## 2023-04-29 ENCOUNTER — Ambulatory Visit (INDEPENDENT_AMBULATORY_CARE_PROVIDER_SITE_OTHER): Payer: Medicare Other | Admitting: Psychiatry

## 2023-04-29 ENCOUNTER — Encounter (INDEPENDENT_AMBULATORY_CARE_PROVIDER_SITE_OTHER): Payer: Self-pay

## 2023-04-29 VITALS — BP 120/68 | HR 74 | Temp 97.8°F | Ht 60.0 in | Wt 97.4 lb

## 2023-04-29 DIAGNOSIS — F320 Major depressive disorder, single episode, mild: Secondary | ICD-10-CM

## 2023-04-29 DIAGNOSIS — F413 Other mixed anxiety disorders: Secondary | ICD-10-CM | POA: Diagnosis not present

## 2023-04-29 DIAGNOSIS — R002 Palpitations: Secondary | ICD-10-CM | POA: Insufficient documentation

## 2023-04-29 DIAGNOSIS — N390 Urinary tract infection, site not specified: Secondary | ICD-10-CM

## 2023-04-29 DIAGNOSIS — F3342 Major depressive disorder, recurrent, in full remission: Secondary | ICD-10-CM | POA: Diagnosis not present

## 2023-04-29 DIAGNOSIS — R451 Restlessness and agitation: Secondary | ICD-10-CM

## 2023-04-29 DIAGNOSIS — R410 Disorientation, unspecified: Secondary | ICD-10-CM | POA: Diagnosis not present

## 2023-04-29 NOTE — Assessment & Plan Note (Signed)
Noncompliance due to behavioral changes Start on Toprol XL

## 2023-04-29 NOTE — Assessment & Plan Note (Addendum)
Chronic.  Noncompliance with treatment due to behavioral changes Start on Toprol XL

## 2023-04-29 NOTE — Assessment & Plan Note (Signed)
Treated

## 2023-04-29 NOTE — Assessment & Plan Note (Signed)
New behavioral changes.  She has been agitated, calling neighbors, family, making threats. She is here with her daughter Vickie Wilson.  Vickie Wilson has not been taking any meds Pt had a UTI recently C/o worsening memory problems, forgetfulness, being upset a lot. Her symptoms has been getting worse over past 6 months, much worse lately. She was seen in the emergency room on 04/08/2023 for palpitations.  She was also found to have a urinary tract infection there cystitis.  Vickie Wilson is openly hostile towards Vickie Wilson, screaming insults. Dementia with agitation and confusion, likely Alzheimer's disease. Start Abilify at low-dose. We placed a home health care referral Go to ER if worse

## 2023-04-29 NOTE — Assessment & Plan Note (Signed)
Continue to monitor A1c/glucose

## 2023-04-29 NOTE — Assessment & Plan Note (Signed)
Off Paroxitene On Abilify, Keppra

## 2023-04-29 NOTE — Assessment & Plan Note (Signed)
Much better on Abilify

## 2023-04-29 NOTE — Assessment & Plan Note (Signed)
Resolved by and large. OK to drive to the grocery store in the daytime Cont w/Abilify

## 2023-04-29 NOTE — Assessment & Plan Note (Addendum)
New behavioral changes with confusion/psychotic features.  She has been agitated, calling neighbors, family, making threats. She is here with her daughter Vickie Wilson.  Vickie Wilson has not been taking any meds Pt had a UTI recently C/o worsening memory problems, forgetfulness, being upset a lot. Her symptoms has been getting worse over past 6 months, much worse lately. She was seen in the emergency room on 04/08/2023 for palpitations.  She was also found to have a urinary tract infection there cystitis.  Vickie Wilson is openly hostile towards [name], screaming insults. Dementia with agitation and confusion, likely Alzheimer's disease. Start Abilify at low-dose. We placed a home health care referral Go to ER if worse

## 2023-04-29 NOTE — Progress Notes (Signed)
Subjective:  Patient ID: Vickie Wilson, female    DOB: 1931-06-20  Age: 89 y.o. MRN: [id number]  CC: Medical Management of Chronic Issues (2 wk f/u appt, patient wants to talk about starting back driving, ) and Back Pain (Having some back pain )   HPI Vickie Wilson presents for 2 wk f/u appt, patient wants to talk about starting back driving, and Back Pain    Recently established care with [facility] which she believes will be helpful.   She had a f/u w/Neurology  Outpatient Medications Prior to Visit  Medication Sig Dispense Refill   ARIPiprazole (ABILIFY) 2 MG tablet Take 1 tablet (2 mg total) by mouth daily. 30 tablet 3   Cholecalciferol (VITAMIN D3) 50 MCG (2000 UT) capsule Take 1 capsule (2,000 Units total) by mouth daily. 100 capsule 3   levETIRAcetam (KEPPRA) 750 MG tablet Take 1 tablet (750 mg total) by mouth 2 (two) times daily. 180 tablet 3   metoprolol succinate (TOPROL-XL) 25 MG 24 hr tablet Take 1 tablet (25 mg total) by mouth daily. 30 tablet 5   Multiple Vitamins-Minerals (CENTRUM SILVER) CHEW Chew 1 each by mouth 3 (three) times a week.     acetaminophen (TYLENOL 8 HOUR) 650 MG CR tablet Take 1 tablet (650 mg total) by mouth every 8 (eight) hours as needed for pain. (Patient not taking: Reported on 04/29/2023) 100 tablet 3   LORazepam (ATIVAN) 0.5 MG tablet Take 1 tablet (0.5 mg total) by mouth 2 (two) times daily as needed for anxiety. (Patient not taking: Reported on 04/29/2023) 60 tablet 1   No facility-administered medications prior to visit.    ROS: Review of Systems  Constitutional:  Negative for activity change, appetite change, chills, fatigue and unexpected weight change.  HENT:  Negative for congestion, mouth sores and sinus pressure.   Eyes:  Negative for visual disturbance.  Respiratory:  Negative for cough and chest tightness.   Cardiovascular:  Positive for palpitations.  Gastrointestinal:  Negative for abdominal pain and nausea.   Genitourinary:  Negative for difficulty urinating, frequency and vaginal pain.  Musculoskeletal:  Positive for back pain and gait problem.  Skin:  Negative for pallor and rash.  Neurological:  Negative for dizziness, tremors, weakness, numbness and headaches.  Psychiatric/Behavioral:  Positive for suicidal ideas. Negative for confusion and sleep disturbance.     Objective:  BP 120/68   Pulse 74   Temp 97.8 F (36.6 C) (Oral)   Ht 5' (1.524 m)   Wt 97 lb 6 oz (44.2 kg)   SpO2 95%   BMI 19.02 kg/m   BP Readings from Last 3 Encounters:  04/29/23 120/68  04/21/23 (!) 152/76  04/14/23 (!) 140/92    Wt Readings from Last 3 Encounters:  04/29/23 97 lb 6 oz (44.2 kg)  04/21/23 99 lb 9.6 oz (45.2 kg)  04/14/23 99 lb (44.9 kg)    Physical Exam Constitutional:      General: She is not in acute distress.    Appearance: Normal appearance. She is well-developed.  HENT:     Head: Normocephalic.     Right Ear: External ear normal.     Left Ear: External ear normal.     Nose: Nose normal.  Eyes:     General:        Right eye: No discharge.        Left eye: No discharge.     Conjunctiva/sclera: Conjunctivae normal.     Pupils: Pupils  are equal, round, and reactive to light.  Neck:     Thyroid: No thyromegaly.     Vascular: No JVD.     Trachea: No tracheal deviation.  Cardiovascular:     Rate and Rhythm: Normal rate and regular rhythm.     Heart sounds: Normal heart sounds.  Pulmonary:     Effort: No respiratory distress.     Breath sounds: No stridor. No wheezing.  Abdominal:     General: Bowel sounds are normal. There is no distension.     Palpations: Abdomen is soft. There is no mass.     Tenderness: There is no abdominal tenderness. There is no guarding or rebound.  Musculoskeletal:        General: No tenderness.     Cervical back: Normal range of motion and neck supple. No rigidity.     Right lower leg: No edema.     Left lower leg: No edema.  Lymphadenopathy:      Cervical: No cervical adenopathy.  Skin:    Findings: No erythema or rash.  Neurological:     Mental Status: Mental status is at baseline.     Cranial Nerves: No cranial nerve deficit.     Motor: No weakness or abnormal muscle tone.     Coordination: Coordination normal.     Gait: Gait normal.     Deep Tendon Reflexes: Reflexes normal.  Psychiatric:        Mood and Affect: Mood normal.        Behavior: Behavior normal.        Thought Content: Thought content normal.        Judgment: Judgment normal.     Lab Results  Component Value Date   WBC 6.2 04/08/2023   HGB 13.9 04/08/2023   HCT 41.4 04/08/2023   PLT 326 04/08/2023   GLUCOSE 112 (H) 04/08/2023   CHOL 279 (H) 12/29/2021   TRIG 174.0 (H) 12/29/2021   HDL 56.50 12/29/2021   LDLDIRECT 125.6 01/16/2013   LDLCALC 188 (H) 12/29/2021   ALT 12 12/29/2021   AST 23 12/29/2021   NA 133 (L) 04/08/2023   K 3.7 04/08/2023   CL 98 04/08/2023   CREATININE 0.93 04/08/2023   BUN 11 04/08/2023   CO2 25 04/08/2023   TSH 2.74 12/29/2021   INR 0.92 04/22/2018   HGBA1C 6.1 08/22/2019   MICROALBUR 2.8 (H) 09/17/2016    DG Chest Port 1 View  Result Date: 04/08/2023 CLINICAL DATA:  Provided history: Palpitations. EXAM: PORTABLE CHEST 1 VIEW COMPARISON:  Prior chest radiograph 12/03/2022 and earlier. FINDINGS: A small portion of the left lateral costophrenic angle is excluded from the field of view. Within this limitation, findings are as follows. Heart size within normal limits. Aortic atherosclerosis. Ill-defined opacity within the lateral left lung base. No appreciable airspace consolidation on the right. No evidence of pleural effusion or pneumothorax. No acute osseous abnormality identified. Chronic, healed fracture deformity of the right sixth rib. IMPRESSION: 1. A small portion of the left lateral costophrenic angle is excluded from the field of view. Within this limitation, findings are as follows. 2. Ill-defined opacity within the  lateral left lung base, which may reflect atelectasis or pneumonia. 3. Aortic Atherosclerosis (ICD10-I70.0). Electronically Signed   By: Jackey Loge D.O.   On: 04/08/2023 11:46    Assessment & Plan:   Problem List Items Addressed This Visit     Anxiety disorder    Off Paroxitene On Abilify, Keppra  Depression    Situational  Paroxetine Worse 8/23.  Tarji is complaining of being depressed, anxious and lonely.  She is complaining of lack of involvement help from her family.  However, she remains independent.  She is not suicidal or homicidal. We will increase paroxetine to 20 mg daily.  We will use lorazepam for anxiety as needed infrequently.  Potential benefits of a long term benzodiazepines  use as well as potential risks  and complications were explained to the patient and were aknowledged. She declined home care RN visit.      Urinary tract infection without hematuria - Primary    Treated      Agitation    Much better on Abilify      Confusion    Resolved by and large. OK to drive to the grocery store in the daytime Cont w/Abilify       UTI - resolved  No orders of the defined types were placed in this encounter.     Follow-up: Return in about 6 weeks (around 06/10/2023) for a follow-up visit.  Sonda Primes, MD

## 2023-04-29 NOTE — Progress Notes (Signed)
      [facility] Counselor/Therapist Progress Note  Patient ID: ANFAL FIORITO, MRN: [id number],    Date: 04/29/2023  Time Spent: 50 minutes   Treatment Type: Individual Therapy  Reported Symptoms: depression, anxiety and states they are "some better"   Mental Status Exam:  Appearance:   Neat     Behavior:  Appropriate, Sharing, and Motivated  Motor:  Normal  Speech/Language:   Clear and Coherent  Affect:  Anxious, depressed  Mood:  anxious  Thought process:  goal directed  Thought content:    Rumination  Sensory/Perceptual disturbances:    WNL  Orientation:  oriented to person, place, time/date, situation, day of week, month of year, year, and stated date of Aug. 15, 2024  Attention:  Good  Concentration:  Good and Fair  Memory:  "Some forgetting, short term memory and Dr is aware"  Fund of knowledge:   Good  Insight:    Good and Fair  Judgment:   Good  Impulse Control:  Good   Risk Assessment: Danger to Self:  No Self-injurious Behavior: No Danger to Others: No Duty to Warn:no Physical Aggression / Violence:No  Access to Firearms a concern: No  Gang Involvement:No   Subjective: Patient in today reporting anxiety and some depression, but "they are some better".  Reports family has pushed her any more about her "house issue". States she does well in keeping up her own townhome and appreciates her neighbors. Feels some stress within family but has decreased some since her first appt here. Wanting to talk further about the future and her anxiety. Was less anxious the more she talked. Discussed what helps her anxiety and what makes it worse. Some loneliness at times.  She agrees to start speaking to more of her neighbors and to people in her church as she seemed to feel the most comfortable in those environments. Does seem to need more social stimulation and inter-activity with others. Is not interested right now in other activities.   Interventions: Cognitive Behavioral  Therapy and Solution-Oriented/Positive Psychology  Elevate mood and show evidence of usual energy, activities, and socialization level. Engage in physical and social activities that reflect increased energy and interest. Verbalize hopeful and positive statements regarding the future.  Diagnosis:   ICD-10-CM   1. Major depressive disorder, single episode, mild (HCC)  F32.0      Plan: Patient in session today and did well expressing her concerns particularly in not having a lot of people connections, depression, and anxiety. Denies any SI. Needs to continues working with goal directed behaviors to make progress and move in a positive directions. Encouraged patient to be more proactive in talking with others in her townhome complex and at church and other place where there's opportunity to make healthy contacts with people.  To consider [facility] activities but didn't seem to interested in that today.  Goal review and progress/challenges noted with patient.  Next appt within 2-3 weeks.   Mathis Fare, LCSW

## 2023-04-29 NOTE — Assessment & Plan Note (Signed)
Situational  Paroxetine Worse 8/23.  Vickie Wilson is complaining of being depressed, anxious and lonely.  She is complaining of lack of involvement help from her family.  However, she remains independent.  She is not suicidal or homicidal. We will increase paroxetine to 20 mg daily.  We will use lorazepam for anxiety as needed infrequently.  Potential benefits of a long term benzodiazepines  use as well as potential risks  and complications were explained to the patient and were aknowledged. She declined home care RN visit.

## 2023-05-13 ENCOUNTER — Other Ambulatory Visit: Payer: Self-pay | Admitting: Internal Medicine

## 2023-05-13 ENCOUNTER — Ambulatory Visit: Payer: Medicare Other | Admitting: Psychiatry

## 2023-05-27 ENCOUNTER — Ambulatory Visit (INDEPENDENT_AMBULATORY_CARE_PROVIDER_SITE_OTHER): Payer: Medicare Other | Admitting: Psychiatry

## 2023-05-27 DIAGNOSIS — F320 Major depressive disorder, single episode, mild: Secondary | ICD-10-CM

## 2023-05-27 NOTE — Progress Notes (Signed)
      [facility] Counselor/Therapist Progress Note  Patient ID: Vickie Wilson, MRN: [id number],    Date: 05/27/2023  Time Spent: 50 minutes   Treatment Type: Individual Therapy  Reported Symptoms: anxious, depressed, anger (mostly with daughter)   Mental Status Exam:  Appearance:   Casual and Neat     Behavior:  Appropriate, Sharing, and Motivated  Motor:  Needing to be more careful in her walking and is doing this as observed today  Speech/Language:   Encouraged to check on hearing aids as she does struggle with hearing; states she is following up on this   Affect:  anxious  Mood:  anxious and depressed  Thought process:  normal  Thought content:    Some ruminating  Sensory/Perceptual disturbances:    WNL  Orientation:  oriented to person, place, time/date, situation, day of week, month of year, year, and stated date of Sept. 12, 2024  Attention:  Fair  Concentration:  Good and Fair  Memory:  Some short term memory issues and Dr is aware  Fund of knowledge:   Good and Fair  Insight:    Good and Fair  Judgment:   Good and Fair  Impulse Control:  Good   Risk Assessment: Danger to Self:  No Self-injurious Behavior: No Danger to Others: No Duty to Warn:no Physical Aggression / Violence:No  Access to Firearms a concern: No  Gang Involvement:No   Subjective:  Patient today in session reporting anxiety, depression, hibernating (at times), some tearfulness. Needed session today to talk further about some issues related to her depression and anxiety, hurt within family, and anger.  Some due to family and some related to "people in general".  Denies any SI.  She did well and talking through and working on issues related to her lack of close relationships, how and what tends to feed her depression and anxiety, and family issues.  Discussed in more detail specific relationship issues and looking at what patient can "control and what she cannot control", in addition to some different  strategies she could try particularly and meeting more people and developing relationships.  Also talked about some potential changes in the way she relates to her adult daughter where there is significant anger, in order to make that relationship healthier.  Interventions: Cognitive Behavioral Therapy and Ego-Supportive  Elevate mood and show evidence of usual energy, activities, and socialization level. Engage in physical and social activities that reflect increased energy and interest. Verbalize hopeful and positive statements regarding the future.  Diagnosis:   ICD-10-CM   1. Major depressive disorder, single episode, mild (HCC)  F32.0      Plan:  Patient in session today talking very openly as she continues working on her lack of closer relationships, depression, family issues, and anxiety.  Is motivated and showing some progress. Needs to continues working with goal directed behaviors in order to make helpful progress and move in a healthier direction. Emphasized for patient to get out of her home more, as she is able to safely do, reconsider visiting the senior adult center and check out all the various activities they have going on there for very little cost, and be proactive in talking with friends at her townhome complex and her church.   Goal review and progress/challenges noted with patient.  Next appt within 2-3 weeks.   Mathis Fare, LCSW

## 2023-06-03 ENCOUNTER — Telehealth: Payer: Self-pay | Admitting: Internal Medicine

## 2023-06-03 DIAGNOSIS — R300 Dysuria: Secondary | ICD-10-CM

## 2023-06-03 NOTE — Telephone Encounter (Signed)
Vickie Wilson has an upcoming appt but she is having trouble using the bathroom Vickie Wilson sated she has been stop up for about a week and its difficult for her to use the bathroom. Vickie Wilson wanted to know do Dr Macario Golds has any suggestions for her. Please advise.

## 2023-06-05 NOTE — Telephone Encounter (Signed)
Bring a urine specimen.  See you me or one of my partners for an office visit next week.  She may have a pelvic organ prolapse. Thanks

## 2023-06-07 NOTE — Telephone Encounter (Signed)
LDVM for pt to call the with more details as to if pt can not urinate or if pt can not move her bowels.

## 2023-06-10 ENCOUNTER — Ambulatory Visit: Payer: Medicare Other | Admitting: Internal Medicine

## 2023-06-10 ENCOUNTER — Encounter: Payer: Self-pay | Admitting: Internal Medicine

## 2023-06-14 ENCOUNTER — Ambulatory Visit (INDEPENDENT_AMBULATORY_CARE_PROVIDER_SITE_OTHER): Payer: Self-pay | Admitting: Psychiatry

## 2023-06-14 DIAGNOSIS — F320 Major depressive disorder, single episode, mild: Secondary | ICD-10-CM

## 2023-06-14 NOTE — Progress Notes (Signed)
Patient no showed for appt today

## 2023-06-16 DIAGNOSIS — N8110 Cystocele, unspecified: Secondary | ICD-10-CM | POA: Diagnosis not present

## 2023-06-21 ENCOUNTER — Telehealth: Payer: Self-pay | Admitting: Internal Medicine

## 2023-06-21 NOTE — Telephone Encounter (Signed)
Pt called stating she is losing her hearing and was looking to get a referral or Dr.plot thoughts please advise.

## 2023-06-22 NOTE — Telephone Encounter (Signed)
Patient called again and would like someone to give her a call back today - [phone]

## 2023-06-23 NOTE — Telephone Encounter (Signed)
It is best to go to [facility] or [facility] for hearing check/hearing aids. Otherwise, we can refer her to an audiologist. Thanks

## 2023-06-24 NOTE — Telephone Encounter (Signed)
LDVM for pt of Dr. Posey Rea suggestions "It is best to go to [facility] or [facility] for hearing check/hearing aids. Otherwise, we can refer her to an audiologist."

## 2023-07-08 ENCOUNTER — Ambulatory Visit: Payer: Medicare Other | Admitting: Psychiatry

## 2023-08-01 ENCOUNTER — Other Ambulatory Visit: Payer: Self-pay | Admitting: Internal Medicine

## 2023-08-17 ENCOUNTER — Ambulatory Visit: Payer: Medicare Other | Admitting: *Deleted

## 2023-08-17 DIAGNOSIS — Z0000 Encounter for general adult medical examination without abnormal findings: Secondary | ICD-10-CM | POA: Diagnosis not present

## 2023-08-17 NOTE — Progress Notes (Cosign Needed Addendum)
Subjective:   Vickie Wilson is a 89 y.o. female who presents for Medicare Annual (Subsequent) preventive examination.  Visit Complete: Virtual I connected with  Murlean Caller on 08/17/23 by a audio enabled telemedicine application and verified that I am speaking with the correct person using two identifiers.  Patient Location: Home  Provider Location: Home Office  I discussed the limitations of evaluation and management by telemedicine. The patient expressed understanding and agreed to proceed.  Vital Signs: Because this visit was a virtual/telehealth visit, some criteria may be missing or patient reported. Any vitals not documented were not able to be obtained and vitals that have been documented are patient reported.    Cardiac Risk Factors include: advanced age (>70men, >80 women);hypertension;family history of premature cardiovascular disease     Objective:    There were no vitals filed for this visit. There is no height or weight on file to calculate BMI.     08/17/2023   11:07 AM 04/08/2023    9:47 AM 08/18/2022   10:23 AM 07/16/2021    4:39 PM 01/04/2020   12:44 PM 10/05/2018    4:54 PM 05/13/2018   10:21 AM  Advanced Directives  Does Patient Have a Medical Advance Directive? Yes Yes Yes Yes Yes Yes Yes  Type of Advance Directive Living will  Healthcare Power of [place];Living will Living will Healthcare Power of [place];Living will Healthcare Power of [place];Living will Healthcare Power of [place];Living will  Does patient want to make changes to medical advance directive?   No - Patient declined No - Patient declined No - Patient declined  No - Patient declined  Copy of Healthcare Power of Attorney in Chart?   No - copy requested  No - copy requested No - copy requested No - copy requested    Current Medications (verified) Outpatient Encounter Medications as of 08/17/2023  Medication Sig   amLODipine (NORVASC) 5 MG tablet Take 5 mg by mouth daily.   ARIPiprazole  (ABILIFY) 2 MG tablet Take 1 tablet (2 mg total) by mouth daily.   Cholecalciferol (VITAMIN D3) 50 MCG (2000 UT) capsule Take 1 capsule (2,000 Units total) by mouth daily.   levETIRAcetam (KEPPRA) 750 MG tablet Take 1 tablet (750 mg total) by mouth 2 (two) times daily.   metoprolol succinate (TOPROL-XL) 25 MG 24 hr tablet Take 1 tablet (25 mg total) by mouth daily.   Multiple Vitamins-Minerals (CENTRUM SILVER) CHEW Chew 1 each by mouth 3 (three) times a week.   acetaminophen (TYLENOL 8 HOUR) 650 MG CR tablet Take 1 tablet (650 mg total) by mouth every 8 (eight) hours as needed for pain. (Patient not taking: Reported on 04/29/2023)   LORazepam (ATIVAN) 0.5 MG tablet Take 1 tablet (0.5 mg total) by mouth 2 (two) times daily as needed for anxiety. (Patient not taking: Reported on 04/29/2023)   No facility-administered encounter medications on file as of 08/17/2023.    Allergies (verified) Patient has no known allergies.   History: Past Medical History:  Diagnosis Date   Allergy    Anxiety    Atrial mass    right   Constipation    Depression    Diabetes mellitus    Hyperlipidemia    Hypertension    Osteoporosis    Seizures (HCC)    Past Surgical History:  Procedure Laterality Date   DILATION AND CURETTAGE OF UTERUS  1987   Family History  Problem Relation Age of Onset   Heart disease Mother  Alcohol abuse Father    Heart disease Father    Hyperlipidemia Other    Hypertension Other    Stroke Other    Social History   Socioeconomic History   Marital status: Single    Spouse name: Not on file   Number of children: 1   Years of education: Not on file   Highest education level: 12th grade  Occupational History   Occupation: retired  Tobacco Use   Smoking status: Former    Current packs/day: 0.00    Average packs/day: 1 pack/day for 20.0 years (20.0 ttl pk-yrs)    Types: Cigarettes    Start date: 03/13/1978    Quit date: 03/13/1998    Years since quitting: 25.4    Smokeless tobacco: Never  Vaping Use   Vaping status: Never Used  Substance and Sexual Activity   Alcohol use: No   Drug use: No   Sexual activity: Not Currently  Other Topics Concern   Not on file  Social History Narrative   Regular exercise-yes      Daily caffeine use      Right handed       03/27/21 Lives at home alone    Social Determinants of Health   Financial Resource Strain: Low Risk  (08/17/2023)   Overall Financial Resource Strain (CARDIA)    Difficulty of Paying Living Expenses: Not hard at all  Food Insecurity: No Food Insecurity (08/17/2023)   Hunger Vital Sign    Worried About Running Out of Food in the Last Year: Never true    Ran Out of Food in the Last Year: Never true  Transportation Needs: No Transportation Needs (08/17/2023)   PRAPARE - Administrator, Civil Service (Medical): No    Lack of Transportation (Non-Medical): No  Physical Activity: Inactive (08/17/2023)   Exercise Vital Sign    Days of Exercise per Week: 0 days    Minutes of Exercise per Session: 0 min  Stress: No Stress Concern Present (08/17/2023)   [employer] of Occupational Health - Occupational Stress Questionnaire    Feeling of Stress : Not at all  Social Connections: Moderately Isolated (08/17/2023)   Social Connection and Isolation Panel [NHANES]    Frequency of Communication with Friends and Family: Three times a week    Frequency of Social Gatherings with Friends and Family: Once a week    Attends Religious Services: 1 to 4 times per year    Active Member of [employer] or Organizations: No    Attends Banker Meetings: Never    Marital Status: Widowed    Tobacco Counseling Counseling given: Not Answered   Clinical Intake:  Pre-visit preparation completed: Yes  Pain : No/denies pain     Diabetes: No  How often do you need to have someone help you when you read instructions, pamphlets, or other written materials from your doctor or pharmacy?: 1 -  Never  Interpreter Needed?: No  Information entered by :: Remi Haggard LPN   Activities of Daily Living    08/17/2023   11:05 AM 08/18/2022   10:14 AM  In your present state of health, do you have any difficulty performing the following activities:  Hearing? 1 0  Vision? 0 0  Difficulty concentrating or making decisions? 0 0  Walking or climbing stairs? 0 0  Dressing or bathing? 0 0  Doing errands, shopping?  0  Preparing Food and eating ? N N  Using the Toilet? N N  In  the past six months, have you accidently leaked urine? N N  Do you have problems with loss of bowel control? N N  Managing your Medications? Y N  Managing your Finances? N N  Housekeeping or managing your Housekeeping? N N    Patient Care Team: Plotnikov, Georgina Quint, MD as PCP - General [place], Peter M, MD as Consulting Physician (Cardiology) Maris Berger, MD as Consulting Physician (Ophthalmology) Micki Riley, MD as Consulting Physician (Neurology) Carrington Clamp, MD as Consulting Physician (Obstetrics and Gynecology)  Indicate any recent Medical Services you may have received from other than [facility] providers in the past year (date may be approximate).     Assessment:   This is a routine wellness examination for Katianne.  Hearing/Vision screen Hearing Screening - Comments:: Some trouble hearing No hearing aids Vision Screening - Comments:: Up to date [facility]   Goals Addressed             This Visit's Progress    Patient Stated       Continue current lifestyle       Depression Screen    08/17/2023   11:10 AM 04/29/2023    1:28 PM 03/08/2023    9:37 AM 12/03/2022    2:54 PM 12/03/2022    2:52 PM 08/20/2022    1:24 PM 08/18/2022   10:21 AM  PHQ 2/9 Scores  PHQ - 2 Score 1 0 2 0 0 0 1  PHQ- 9 Score 3  7 0  3     Fall Risk    08/17/2023   11:03 AM 04/29/2023    1:28 PM 03/08/2023    9:37 AM 12/03/2022    2:51 PM 08/20/2022    1:23 PM  Fall Risk   Falls in the past year? 1 0 0 0 0   Number falls in past yr: 1 0 0 0 0  Injury with Fall? 0 0 0 0 0  Risk for fall due to : Impaired balance/gait No Fall Risks Impaired balance/gait No Fall Risks No Fall Risks  Follow up Falls evaluation completed;Education provided;Falls prevention discussed Falls evaluation completed Falls evaluation completed Falls evaluation completed Falls evaluation completed    MEDICARE RISK AT HOME: Medicare Risk at Home Any stairs in or around the home?: Yes If so, are there any without handrails?: No Home free of loose throw rugs in walkways, pet beds, electrical cords, etc?: Yes Adequate lighting in your home to reduce risk of falls?: Yes Life alert?: No Use of a cane, walker or w/c?: Yes Grab bars in the bathroom?: Yes Shower chair or bench in shower?: No Elevated toilet seat or a handicapped toilet?: Yes  TIMED UP AND GO:  Was the test performed?  No    Cognitive Function:    10/06/2018    8:11 AM  MMSE - Mini Mental State Exam  Not completed: Refused        08/17/2023   11:11 AM 08/18/2022   10:34 AM  6CIT Screen  What Year? 0 points 0 points  What month? 0 points 0 points  What time? 0 points 0 points  Count back from 20 2 points 0 points  Months in reverse 4 points 4 points  Repeat phrase 10 points 4 points  Total Score 16 points 8 points    Immunizations Immunization History  Administered Date(s) Administered   Fluad Quad(high Dose 65+) 05/18/2019, 09/19/2020   Influenza Split 06/23/2011, 07/19/2012   Influenza Whole 06/21/2008, 07/12/2009, 07/30/2010   Influenza,  High Dose Seasonal PF 09/02/2016, 05/27/2017, 06/15/2018   Influenza,inj,Quad PF,6+ Mos 05/23/2013, 06/22/2014, 06/14/2015   PFIZER(Purple Top)SARS-COV-2 Vaccination 10/05/2019, 10/26/2019, 06/18/2020   Pfizer Covid-19 Vaccine Bivalent Booster 48yrs & up 08/25/2021   Pneumococcal Conjugate-13 12/08/2013   Pneumococcal Polysaccharide-23 08/01/2007, 08/11/2017   Td 04/30/2010   Tdap 07/31/2017    TDAP  status: Up to date  Flu Vaccine status: Declined, Education has been provided regarding the importance of this vaccine but patient still declined. Advised may receive this vaccine at local pharmacy or Health Dept. Aware to provide a copy of the vaccination record if obtained from local pharmacy or Health Dept. Verbalized acceptance and understanding.  Pneumococcal vaccine status: Up to date  Covid-19 vaccine status: Declined, Education has been provided regarding the importance of this vaccine but patient still declined. Advised may receive this vaccine at local pharmacy or Health Dept.or vaccine clinic. Aware to provide a copy of the vaccination record if obtained from local pharmacy or Health Dept. Verbalized acceptance and understanding.  Qualifies for Shingles Vaccine? Yes   Zostavax completed No   Shingrix Completed?: No.    Education has been provided regarding the importance of this vaccine. Patient has been advised to call insurance company to determine out of pocket expense if they have not yet received this vaccine. Advised may also receive vaccine at local pharmacy or Health Dept. Verbalized acceptance and understanding.  Screening Tests Health Maintenance  Topic Date Due   HEMOGLOBIN A1C  02/20/2020   OPHTHALMOLOGY EXAM  08/17/2023 (Originally 09/24/2019)   FOOT EXAM  08/25/2023 (Originally 02/03/1941)   COVID-19 Vaccine (5 - 2023-24 season) 09/02/2023 (Originally 05/16/2023)   INFLUENZA VACCINE  12/13/2023 (Originally 04/15/2023)   DEXA SCAN  08/16/2024 (Originally 02/02/1998)   Medicare Annual Wellness (AWV)  08/16/2024   DTaP/Tdap/Td (3 - Td or Tdap) 08/01/2027   Pneumonia Vaccine 1+ Years old  Completed   HPV VACCINES  Aged Out   Zoster Vaccines- Shingrix  Discontinued    Health Maintenance  Health Maintenance Due  Topic Date Due   HEMOGLOBIN A1C  02/20/2020    Colorectal cancer screening: No longer required.   Mammogram status: No longer required due to age.  Bone  Density  Declined  Lung Cancer Screening: (Low Dose CT Chest recommended if Age 44-80 years, 20 pack-year currently smoking OR have quit w/in 15years.) does not qualify.   Lung Cancer Screening Referral:   Additional Screening:  Hepatitis C Screening: does not qualify;  Vision Screening: Recommended annual ophthalmology exams for early detection of glaucoma and other disorders of the eye. Is the patient up to date with their annual eye exam?  Yes  Who is the provider or what is the name of the office in which the patient attends annual eye exams? [facility] If pt is not established with a provider, would they like to be referred to a provider to establish care? No .   Dental Screening: Recommended annual dental exams for proper oral hygiene    Community Resource Referral / Chronic Care Management: CRR required this visit?  No   CCM required this visit?  No     Plan:     I have personally reviewed and noted the following in the patient's chart:   Medical and social history Use of alcohol, tobacco or illicit drugs  Current medications and supplements including opioid prescriptions. Patient is not currently taking opioid prescriptions. Functional ability and status Nutritional status Physical activity Advanced directives List of other physicians Hospitalizations, surgeries, and  ER visits in previous 12 months Vitals Screenings to include cognitive, depression, and falls Referrals and appointments  In addition, I have reviewed and discussed with patient certain preventive protocols, quality metrics, and best practice recommendations. A written personalized care plan for preventive services as well as general preventive health recommendations were provided to patient.     Remi Haggard, LPN   [id number]   After Visit Summary: (MyChart) Due to this being a telephonic visit, the after visit summary with patients personalized plan was offered to patient via MyChart   Nurse Notes:     Medical screening examination/treatment/procedure(s) were performed by non-physician practitioner and as supervising physician I was immediately available for consultation/collaboration.  I agree with above. Jacinta Shoe, MD

## 2023-08-17 NOTE — Patient Instructions (Signed)
Vickie Wilson , Thank you for taking time to come for your Medicare Wellness Visit. I appreciate your ongoing commitment to your health goals. Please review the following plan we discussed and let me know if I can assist you in the future.   Screening recommendations/referrals: Colonoscopy: no longer required Mammogram: no longer required Bone Density: Education provided Recommended yearly ophthalmology/optometry visit for glaucoma screening and checkup Recommended yearly dental visit for hygiene and checkup  Vaccinations: Influenza vaccine: Education provided Pneumococcal vaccine: up to date Tdap vaccine: up tod ate Shingles vaccine: Education provided    Advanced directives: Education prov   Preventive Care 87 Years and Older, Female Preventive care refers to lifestyle choices and visits with your health care provider that can promote health and wellness. What does preventive care include? A yearly physical exam. This is also called an annual well check. Dental exams once or twice a year. Routine eye exams. Ask your health care provider how often you should have your eyes checked. Personal lifestyle choices, including: Daily care of your teeth and gums. Regular physical activity. Eating a healthy diet. Avoiding tobacco and drug use. Limiting alcohol use. Practicing safe sex. Taking low-dose aspirin every day. Taking vitamin and mineral supplements as recommended by your health care provider. What happens during an annual well check? The services and screenings done by your health care provider during your annual well check will depend on your age, overall health, lifestyle risk factors, and family history of disease. Counseling  Your health care provider may ask you questions about your: Alcohol use. Tobacco use. Drug use. Emotional well-being. Home and relationship well-being. Sexual activity. Eating habits. History of falls. Memory and ability to understand  (cognition). Work and work Astronomer. Reproductive health. Screening  You may have the following tests or measurements: Height, weight, and BMI. Blood pressure. Lipid and cholesterol levels. These may be checked every 5 years, or more frequently if you are over 79 years old. Skin check. Lung cancer screening. You may have this screening every year starting at age 65 if you have a 30-pack-year history of smoking and currently smoke or have quit within the past 15 years. Fecal occult blood test (FOBT) of the stool. You may have this test every year starting at age 28. Flexible sigmoidoscopy or colonoscopy. You may have a sigmoidoscopy every 5 years or a colonoscopy every 10 years starting at age 63. Hepatitis C blood test. Hepatitis B blood test. Sexually transmitted disease (STD) testing. Diabetes screening. This is done by checking your blood sugar (glucose) after you have not eaten for a while (fasting). You may have this done every 1-3 years. Bone density scan. This is done to screen for osteoporosis. You may have this done starting at age 66. Mammogram. This may be done every 1-2 years. Talk to your health care provider about how often you should have regular mammograms. Talk with your health care provider about your test results, treatment options, and if necessary, the need for more tests. Vaccines  Your health care provider may recommend certain vaccines, such as: Influenza vaccine. This is recommended every year. Tetanus, diphtheria, and acellular pertussis (Tdap, Td) vaccine. You may need a Td booster every 10 years. Zoster vaccine. You may need this after age 12. Pneumococcal 13-valent conjugate (PCV13) vaccine. One dose is recommended after age 69. Pneumococcal polysaccharide (PPSV23) vaccine. One dose is recommended after age 67. Talk to your health care provider about which screenings and vaccines you need and how often you need them.  This information is not intended to  replace advice given to you by your health care provider. Make sure you discuss any questions you have with your health care provider. Document Released: 09/27/2015 Document Revised: 05/20/2016 Document Reviewed: 07/02/2015 Elsevier Interactive Patient Education  2017 [employer].  Fall Prevention in the Home Falls can cause injuries. They can happen to people of all ages. There are many things you can do to make your home safe and to help prevent falls. What can I do on the outside of my home? Regularly fix the edges of walkways and driveways and fix any cracks. Remove anything that might make you trip as you walk through a door, such as a raised step or threshold. Trim any bushes or trees on the path to your home. Use bright outdoor lighting. Clear any walking paths of anything that might make someone trip, such as rocks or tools. Regularly check to see if handrails are loose or broken. Make sure that both sides of any steps have handrails. Any raised decks and porches should have guardrails on the edges. Have any leaves, snow, or ice cleared regularly. Use sand or salt on walking paths during winter. Clean up any spills in your garage right away. This includes oil or grease spills. What can I do in the bathroom? Use night lights. Install grab bars by the toilet and in the tub and shower. Do not use towel bars as grab bars. Use non-skid mats or decals in the tub or shower. If you need to sit down in the shower, use a plastic, non-slip stool. Keep the floor dry. Clean up any water that spills on the floor as soon as it happens. Remove soap buildup in the tub or shower regularly. Attach bath mats securely with double-sided non-slip rug tape. Do not have throw rugs and other things on the floor that can make you trip. What can I do in the bedroom? Use night lights. Make sure that you have a light by your bed that is easy to reach. Do not use any sheets or blankets that are too big for  your bed. They should not hang down onto the floor. Have a firm chair that has side arms. You can use this for support while you get dressed. Do not have throw rugs and other things on the floor that can make you trip. What can I do in the kitchen? Clean up any spills right away. Avoid walking on wet floors. Keep items that you use a lot in easy-to-reach places. If you need to reach something above you, use a strong step stool that has a grab bar. Keep electrical cords out of the way. Do not use floor polish or wax that makes floors slippery. If you must use wax, use non-skid floor wax. Do not have throw rugs and other things on the floor that can make you trip. What can I do with my stairs? Do not leave any items on the stairs. Make sure that there are handrails on both sides of the stairs and use them. Fix handrails that are broken or loose. Make sure that handrails are as long as the stairways. Check any carpeting to make sure that it is firmly attached to the stairs. Fix any carpet that is loose or worn. Avoid having throw rugs at the top or bottom of the stairs. If you do have throw rugs, attach them to the floor with carpet tape. Make sure that you have a light switch at the  top of the stairs and the bottom of the stairs. If you do not have them, ask someone to add them for you. What else can I do to help prevent falls? Wear shoes that: Do not have high heels. Have rubber bottoms. Are comfortable and fit you well. Are closed at the toe. Do not wear sandals. If you use a stepladder: Make sure that it is fully opened. Do not climb a closed stepladder. Make sure that both sides of the stepladder are locked into place. Ask someone to hold it for you, if possible. Clearly mark and make sure that you can see: Any grab bars or handrails. First and last steps. Where the edge of each step is. Use tools that help you move around (mobility aids) if they are needed. These  include: Canes. Walkers. Scooters. Crutches. Turn on the lights when you go into a dark area. Replace any light bulbs as soon as they burn out. Set up your furniture so you have a clear path. Avoid moving your furniture around. If any of your floors are uneven, fix them. If there are any pets around you, be aware of where they are. Review your medicines with your doctor. Some medicines can make you feel dizzy. This can increase your chance of falling. Ask your doctor what other things that you can do to help prevent falls. This information is not intended to replace advice given to you by your health care provider. Make sure you discuss any questions you have with your health care provider. Document Released: 06/27/2009 Document Revised: 02/06/2016 Document Reviewed: 10/05/2014 Elsevier Interactive Patient Education  2017 [employer].

## 2023-09-13 ENCOUNTER — Telehealth: Payer: Self-pay

## 2023-09-13 NOTE — Telephone Encounter (Signed)
Copied from CRM [id number]. Topic: Clinical - Medical Advice >> Sep 13, 2023 11:50 AM Larwance Sachs wrote:  Reason for CRM: Patient is asking for a call from Dr. Posey Rea regarding possibly being prescribed medication for depression, please call back at [phone]. Patient also stated not being able to get into the clinic for appointment as of now

## 2023-09-14 ENCOUNTER — Telehealth: Payer: Self-pay

## 2023-09-14 NOTE — Telephone Encounter (Signed)
We can increase Abilify to 4 mg. Thx

## 2023-09-14 NOTE — Telephone Encounter (Signed)
 Copied from CRM [id number]. Topic: Clinical - Medical Advice >> Sep 14, 2023  9:49 AM Mercedes MATSU wrote: Reason for CRM: Patient is asking for a call from Dr. Garald regarding possibly being prescribed medication for depression, please call back at [phone]. Patient also stated not being able to get into the clinic for appointment as of now

## 2023-09-16 ENCOUNTER — Other Ambulatory Visit: Payer: Self-pay | Admitting: Internal Medicine

## 2023-09-16 ENCOUNTER — Telehealth: Payer: Self-pay | Admitting: Internal Medicine

## 2023-09-16 NOTE — Telephone Encounter (Signed)
 LM for pt with PCP response. Provided office number for any questions

## 2023-09-16 NOTE — Telephone Encounter (Signed)
 Copied from CRM [id number]. Topic: Clinical - Medication Question >> Sep 16, 2023 11:16 AM Drema MATSU wrote: Reason for CRM: Patient is returning a call from nurse regarding her Depression medication. Patient has questions regarding medication; she wants to know which one because she take 3.

## 2023-09-19 NOTE — Telephone Encounter (Signed)
 This was my previous reply: "We can increase Abilify to 4 mg." Thx

## 2023-09-30 ENCOUNTER — Other Ambulatory Visit: Payer: Self-pay | Admitting: Internal Medicine

## 2023-10-13 ENCOUNTER — Ambulatory Visit: Payer: Medicare Other | Admitting: Adult Health

## 2023-11-12 ENCOUNTER — Ambulatory Visit: Payer: Medicare Other | Admitting: Internal Medicine

## 2023-11-25 ENCOUNTER — Ambulatory Visit: Payer: Medicare Other | Admitting: Internal Medicine

## 2023-11-26 DIAGNOSIS — H5213 Myopia, bilateral: Secondary | ICD-10-CM | POA: Diagnosis not present

## 2023-11-26 DIAGNOSIS — Z961 Presence of intraocular lens: Secondary | ICD-10-CM | POA: Diagnosis not present

## 2023-11-26 DIAGNOSIS — H353132 Nonexudative age-related macular degeneration, bilateral, intermediate dry stage: Secondary | ICD-10-CM | POA: Diagnosis not present

## 2024-01-04 ENCOUNTER — Other Ambulatory Visit: Payer: Self-pay | Admitting: Adult Health

## 2024-01-04 ENCOUNTER — Telehealth: Payer: Self-pay | Admitting: Internal Medicine

## 2024-01-04 NOTE — Telephone Encounter (Signed)
 Copied from CRM [id number]. Topic: Referral - Request for Referral >> Jan 04, 2024  2:54 PM Fredrica W wrote: Did the patient discuss referral with their provider in the last year? N/A patient can not recall if she has spoken to provider about this  (If No - schedule appointment) (If Yes - send message)  Appointment offered? No  Type of order/referral and detailed reason for visit: Would like to see hearing specialist   Preference of office, provider, location: anywhere provider recommends that accepts her insurance   If referral order, have you been seen by this specialty before? N/A (If Yes, this issue or another issue? When? Where?  Can we respond through MyChart? No

## 2024-01-10 NOTE — Telephone Encounter (Signed)
Spoke with patient, gave a verbal understanding °

## 2024-01-10 NOTE — Telephone Encounter (Signed)
 What practice does she want to go to? Did she try [facility] or [facility] (no need for referral to go there)? Thanks

## 2024-01-14 DIAGNOSIS — H6123 Impacted cerumen, bilateral: Secondary | ICD-10-CM | POA: Diagnosis not present

## 2024-01-24 ENCOUNTER — Telehealth: Payer: Self-pay | Admitting: Adult Health

## 2024-01-24 NOTE — Telephone Encounter (Signed)
 Pt called to confirm Appt Details.  Appt Detail Confirm

## 2024-01-26 ENCOUNTER — Encounter: Payer: Self-pay | Admitting: Adult Health

## 2024-01-26 ENCOUNTER — Ambulatory Visit: Admitting: Adult Health

## 2024-01-26 VITALS — BP 132/80 | HR 74 | Ht 60.0 in | Wt 93.0 lb

## 2024-01-26 DIAGNOSIS — R002 Palpitations: Secondary | ICD-10-CM

## 2024-01-26 DIAGNOSIS — F418 Other specified anxiety disorders: Secondary | ICD-10-CM

## 2024-01-26 DIAGNOSIS — G40109 Localization-related (focal) (partial) symptomatic epilepsy and epileptic syndromes with simple partial seizures, not intractable, without status epilepticus: Secondary | ICD-10-CM | POA: Diagnosis not present

## 2024-01-26 DIAGNOSIS — Z7409 Other reduced mobility: Secondary | ICD-10-CM

## 2024-01-26 NOTE — Patient Instructions (Addendum)
 Your Plan:  Continue Keppra  750 mg twice daily for seizure prevention  Follow up with your PCP regarding heart rate concerns - may need to consider evaluation with a cardiologist if symptoms persist   I will follow up with your primary doctor regarding adjustment of Abilify  dosage - if you do not hear from them by Friday, please call them   You will be called to a  social worker to discuss possible options for transportation assistance and in home care/assistance       Follow-up in 1 year or call earlier if needed      Thank you for coming to see us  at [facility]. I hope we have been able to provide you high quality care today.  You may receive a patient satisfaction survey over the next few weeks. We would appreciate your feedback and comments so that we may continue to improve ourselves and the health of our patients.

## 2024-01-26 NOTE — Progress Notes (Signed)
 [facility] [address] [place]. [place] [address] [phone]       OFFICE FOLLOW UP NOTE  [name] Date of Birth:  88-Sep-1932 Medical Record Number:  [id number]    Primary neurologist: Dr. Janett Medin Reason for visit: Seizure   Chief Complaint  Patient presents with   Follow-up    Pt alone, rm 3. She denies any seizure activity since her last.  She states that she doesn't feel confident with her driving and she has stopped driving (no wreck or anything) she is unsure of what medications she is/isn't taken. She knows she is still taking her keppra  as prescribed but the other      HPI  Update 01/26/2024 [name] returns for follow-up visit for management of seizures. Her daughter dropped her off today. Denies any seizure activity, remains on Keppra  750 mg twice daily without side effects.  Continues to maintain ADLs and IADLs independently.  She no longer drives as she does not feel she is safe driving. She continues to struggle with depression/anxiety, she feels very isolated and alone, her daughter helps when able but is very busy with her own job and family. She has been having greater difficulty maintaining IADLs such as cleaning and cooking. She feels her gait has been slowly declining (although no recent falls) and does have occasional difficulty with short-term.  Previously being seen by [facility] but stopped going as she reports insurance would not cover. She is unable to afford living in a retirement community or independent living facility.  She is currently on Abilify  2mg  daily, per epic review PCP recommended increasing dose to 4mg  but was never increased. She also mentions about 3 months ago, she felt like her heart was beating so fast it was going to beat out of her chest, she has had previous c/o palpitations without clear cause and improved after PCP started metoprolol  and Abilify  in the past, she is unsure if she is still on metoprolol ,  her daughter manages her medications.      History provided for reference purposes only Update 04/21/2023 [name] being seen today unaccompanied for sooner visit per patient request due to left-sided head pain which started around May. Was experiencing quick abnormal tap type sensation on left side of head, only occurred a couple times, not overly painful, denies zap or stabbing sensation.  No other associated symptoms.  Reports resolution of these symptoms since that time. Denies any seizure activity, continues on Keppra  750mg  BID. Primarily discusses family stressors causing anger and depression/anxiety. Recently established care with [facility] which she believes will be helpful. Also mentions issues with palpitations (eval in ED 7/25 without clear cause), has greatly improved since PCP started metoprolol  and Abilify  at visit on 7/31.  Use of Ativan  as needed. At very end of visit while walking out, she also mentions gradual decline of her walking, has been more unsteady, believes this is due to all the stress going on.   Update 10/07/2022 [name] returns for yearly seizure follow-up unaccompanied. Stable, denies any seizure activity over the past year.  Remains on Keppra  750mg  BID, tolerating well.  Maintain ADLs and IADLs independently.  She continues to struggle with depression/anxiety, she is scheduled to be seen by behavioral health for initial evaluation tomorrow which she is looking forward to.  No questions or concerns today  Update 10/07/2021 JM: Returns for 88-month seizure follow-up unaccompanied. Doing well on keppra  750mg  twice daily - denies side effects. No  seizure activity. Tried to decrease dose but did not feel well (felt shaking and unwell) therefore increased dose back with resolution of symptoms. Lives alone.  Maintains maintains ADLs and IADLs independently. Reports frustration due to limited help from daughter. She feels isolated and depressed. Denies SI/HI.  Reports her daughter was supposed to bring her today but due to work, unable to bring her - she drove herself and had difficulty finding office - her car broke down in near by parking lot and walked over to office. Is fearful she will end up in a nursing home as at times she will have difficulty maintaining all IADLs. No further concerns at this time.   Update 03/27/2021 JM: Ms. Lomeli returns for yearly seizure follow-up.  Stable since prior visit without recurrent seizure activity.  Remains on Keppra  without side effects.  She does question potentially lowering dosage as she has not had any seizure activity over the past 3 years.  She remains active maintaining all ADLs and IADLs independently.  No further concerns at this time.  Update 03/27/2020 JM: Ms. Aoyama returns for seizure follow-up.  She has been doing well since prior visit without recurrent seizure activity or events.  Remains on Keppra  750 mg twice daily tolerating dosage without side effects.  Currently following with PCP for lower extremity pains and fatigue with Crestor  currently on hold for possible medication side effect.  She does have follow-up in the near future.  Follows with cardiology regularly.  Blood pressure stable at 130/70.  No concerns at this time.  Update 03/27/2019 Dr. Janett Medin: She returns for follow-up after last visit in December 2019 with Camilo Cella, nurse practitioner.  She is doing well and has not had any recurrent episodes of sensory seizures or any other neurological issues.  She remains on Keppra  750 mg twice daily which she is tolerating well without dizziness or other side effects.  She woke up 1 day about a month ago with feeling of coldness and heaviness in both her feet but this does not last long and cleared in an hour or 2.  She did not seek medical help.  She saw her primary care physician few weeks ago who did some lab work all of which was satisfactory.  She states her blood pressure usually runs good but today it is  slightly elevated in our office and 155/80 she feels this may be due to white coat hypertension.  She has no new complaints today.  Interval history 08/30/2018 [name] is being seen today for 80-month follow-up visit.  She did undergo repeat EEG which was normal without evidence of seizure activity.  She did increase keppra  after prior appointment.  Approximately 3 to 4 weeks ago, patient did experience episode of left hand numbness which lasted approximately 15 seconds and then resolved.  She denies any additional episode occurring.  She does endorse fatigue during the day and tiring quicker than before but she is unsure if this is due to medications versus old age.  She does experience some unsteadiness at times but was also experiencing this prior to her [facility] and being on keppra .  She did undergo a couple physical therapy sessions but financially was unable to continue.  She did receive handout regarding exercises to do at home to assist with balance but she does admit to not doing this.  She does state that she consistently stays busy as she does live independently and manages all ADLs and IADLs.  She is able to continue to  ambulate without assistive device and denies any recent falls.  She also states that intermittently she will have a burning sensation/sharp pinprick sensation on her left middle finger knuckle and left AC area which may only last for 1 to 2 minutes and then subside.  She believes that this pain could be due to arthritis and is not overly concerned as it happens infrequently.  Pressure today 141/80.  She continues on Crestor  for HLD management.  No further concerns at this time.  Denies new or worsening stroke/TIA symptoms or seizure activity.   initial visit 05/26/2018 PS: Ms Goeken is a pleasant 46 year Caucasian lady seen today for initial consultation visit following hospital admission recently for paresthesias. History is provided by the patient and review of electronic medical  records. I personally reviewed imaging films.YALENA BOKER is an 89 y.o. female past medical history of hypertension, hyperlipidemia, diabetes mellitus, atrial mass who presents to the emergency room on 04/22/18 after having multiple spells of numbness over the left hand extending to the arm as well as left facial numbness.She stated her first episode was about 1 week ago lasting about 15 minutes mainly if he affecting her left hand, arm as well as the left side of her face. Resolved spontaneously.  Continued to have 3 more episodes and therefore today she decided to come to the hospital.  She denies any weakness, however states she was not able to use her hand like she normally could.  Patient denies any jerking/twitching movements of the left arm or face.  Work-up in the ER included MRI brain which revealed a small area of subarachnoid hemorrhage on MRI brain in the right frontal lobe convexity only. This was difficult to visualize on the CT scan corresponding cuts..  Her CT scan was   negative h .  She denied any history of recent head trauma, however she had a fall few weeks ago. She was on ASA at home. EEG done on 04/23/18 was normal. Transthoracic echo showed normal ejection fraction. Patient was started on Keppra  500 twice daily and had no further episodes after that was started. 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 Neurosurgery was consulted and recommended conservative follow-up for the same. Patient states she was doing well after discharge until 5 days ago when she had 2 transient episodes of numbness in her left hand gradually going up to her left elbow but this time not involving her face. This occurred twice on one day and then once the next day. She has not had these episodes for the last 3 days at least. She remains on Keppra  500 twice daily which is tolerating well without any  side effects. She states she has not missed any doses.     ROS:   14 system review of systems is positive for those listed in HPI and all other systems negative  PMH:  Past Medical History:  Diagnosis Date   Allergy    Anxiety    Atrial mass    right   Constipation    Depression    Diabetes mellitus    Hyperlipidemia    Hypertension    Osteoporosis    Seizures (HCC)     Social History:  Social History   Socioeconomic History   Marital status: Single    Spouse name: Not on file   Number of children: 1   Years of  education: Not on file   Highest education level: 12th grade  Occupational History   Occupation: retired  Tobacco Use   Smoking status: Former    Current packs/day: 0.00    Average packs/day: 1 pack/day for 20.0 years (20.0 ttl pk-yrs)    Types: Cigarettes    Start date: 03/13/1978    Quit date: 03/13/1998    Years since quitting: 25.8   Smokeless tobacco: Never  Vaping Use   Vaping status: Never Used  Substance and Sexual Activity   Alcohol use: No   Drug use: No   Sexual activity: Not Currently  Other Topics Concern   Not on file  Social History Narrative   Regular exercise-yes      Daily caffeine use      Right handed       03/27/21 Lives at home alone    Social Drivers of Health   Financial Resource Strain: Low Risk  (08/17/2023)   Overall Financial Resource Strain (CARDIA)    Difficulty of Paying Living Expenses: Not hard at all  Food Insecurity: No Food Insecurity (08/17/2023)   Hunger Vital Sign    Worried About Running Out of Food in the Last Year: Never true    Ran Out of Food in the Last Year: Never true  Transportation Needs: No Transportation Needs (08/17/2023)   PRAPARE - Administrator, Civil Service (Medical): No    Lack of Transportation (Non-Medical): No  Physical Activity: Inactive (08/17/2023)   Exercise Vital Sign    Days of Exercise per Week: 0 days    Minutes of Exercise per Session: 0 min  Stress: No  Stress Concern Present (08/17/2023)   [employer] of Occupational Health - Occupational Stress Questionnaire    Feeling of Stress : Not at all  Social Connections: Moderately Isolated (08/17/2023)   Social Connection and Isolation Panel [NHANES]    Frequency of Communication with Friends and Family: Three times a week    Frequency of Social Gatherings with Friends and Family: Once a week    Attends Religious Services: 1 to 4 times per year    Active Member of [employer] or Organizations: No    Attends Banker Meetings: Never    Marital Status: Widowed  Intimate Partner Violence: Not At Risk (08/17/2023)   Humiliation, Afraid, Rape, and Kick questionnaire    Fear of Current or Ex-Partner: No    Emotionally Abused: No    Physically Abused: No    Sexually Abused: No    Medications:   Current Outpatient Medications on File Prior to Visit  Medication Sig Dispense Refill   levETIRAcetam  (KEPPRA ) 750 MG tablet Take 1 tablet (750 mg total) by mouth 2 (two) times daily. 180 tablet 3   acetaminophen  (TYLENOL  8 HOUR) 650 MG CR tablet Take 1 tablet (650 mg total) by mouth every 8 (eight) hours as needed for pain. (Patient not taking: Reported on 04/29/2023) 100 tablet 3   amLODipine  (NORVASC ) 5 MG tablet Take 5 mg by mouth daily.     ARIPiprazole  (ABILIFY ) 2 MG tablet TAKE ONE TABLET BY MOUTH DAILY 30 tablet 3   LORazepam  (ATIVAN ) 0.5 MG tablet Take 1 tablet (0.5 mg total) by mouth 2 (two) times daily as needed for anxiety. (Patient not taking: Reported on 04/29/2023) 60 tablet 1   metoprolol  succinate (TOPROL -XL) 25 MG 24 hr tablet Take 1 tablet (25 mg total) by mouth daily. 30 tablet 5   Multiple Vitamins-Minerals (CENTRUM SILVER ) CHEW Chew  1 each by mouth 3 (three) times a week.     No current facility-administered medications on file prior to visit.    Allergies:   No Known Allergies    Vitals Today's Vitals   01/26/24 1324  BP: 132/80  Pulse: 74  Weight: 93 lb (42.2 kg)   Height: 5' (1.524 m)    Body mass index is 18.16 kg/m.  Physical Exam General: frail petite very pleasant elderly Caucasian lady, seated, quite anxious and tearful Head: head normocephalic and atraumatic.   Neck: supple with no carotid or supraclavicular bruits Cardiovascular: regular rate and rhythm, no murmurs  Neurologic Exam Mental Status: Awake and fully alert.  Fluent speech and language.  Oriented to place and time. Recent and remote memory intact. Attention span, concentration and fund of knowledge appropriate. Mood and affect appropriate although occasionally tearful through visit.  Cranial Nerves: Pupils equal, briskly reactive to light. Extraocular movements full without nystagmus. Visual fields full to confrontation. Hearing diminished bilaterally. Facial sensation intact. Face, tongue, palate moves normally and symmetrically.  Motor: Normal bulk and tone. Normal strength in all tested extremity muscles. Sensory.: intact to touch , pinprick , position and vibratory sensation.  Coordination: Rapid alternating movements normal in all extremities. Finger-to-nose and heel-to-shin performed accurately bilaterally. Gait and Station: Arises from chair without difficulty. Stance is normal. Gait demonstrates mild unsteadiness with slow cautious gait, no use of AD Reflexes: 1+ and symmetric. Toes downgoing.       ASSESSMENT: 89 year old lady with recurrent fleeting left arm and face paresthesias likely simple partial seizures likely symptomatic from small localized right frontal convexity subarachnoid hemorrhage which is non-aneurysmal and non-traumatic in etiology. Noted onset around 01/2023 of abnormal sensation ("tap or ping") on left side of head, no overly painful, only occurred a few times, not associated with any other symptoms, no recurrence.  She continues to struggle significantly with anxiety/depression, she is no longer driving which has worsened her depression.  Also  mentions gradual decline of gait and balance.     PLAN:   Simple partial seizures  -no recent events  -Continue Keppra  750 mg twice daily -refill provided  -previously attempted to lower dose but felt "shaky and ill" therefore increased dose back with resolution of symptoms -Advised to call with any seizure activity  Depression/anxiety -no longer sees psychiatry due to financial reasons, will continue to follow with PCP for management -Continue Abilify  - will reach out to PCP office re: dosage increase  -referral placed for social work - unsure if there is available assistance for transportation needs and to assist with IADLs  Palpitations  - Suspect more due to underlying anxiety  - Previously reported well-controlled on metoprolol , patient unsure if currently taking. She will review medication list with daughter and update office  - Encouraged follow-up with PCP for further evaluation  Gait and balance disturbance  - Reports continued gradual decline although no significant changes during today's visit compared to prior visit, suspect more due to deconditioning and age  -discussed potential benefit with PT but declines interest at this time - she will call office if she wishes to pursue in the future       Follow-up in 1 year or call earlier if needed    CC:  Plotnikov, Oakley Bellman, MD     I spent 45 minutes of face-to-face and non-face-to-face time with patient.  This included previsit chart review, lab review, study review, order entry, electronic health record documentation, patient education and discussion regarding  the above and answered all the questions to patient's satisfaction  Johny Nap, [facility]  [facility] [address] [place], [place] [address]  Phone [phone] Fax [phone] Note: This document was prepared with digital dictation and possible smart phrase technology. Any transcriptional errors that result from  this process are unintentional.

## 2024-01-31 ENCOUNTER — Telehealth: Payer: Self-pay | Admitting: Adult Health

## 2024-01-31 NOTE — Telephone Encounter (Signed)
 Referral for social work fax to [facility]. Phone: [phone], Fax: [phone]

## 2024-02-01 ENCOUNTER — Telehealth: Payer: Self-pay

## 2024-02-01 DIAGNOSIS — I10 Essential (primary) hypertension: Secondary | ICD-10-CM

## 2024-02-11 ENCOUNTER — Telehealth: Payer: Self-pay | Admitting: *Deleted

## 2024-02-11 NOTE — Progress Notes (Signed)
 Complex Care Management Note Care Guide Note  02/11/2024 Name: Vickie Wilson MRN: [id number] DOB: 11-17-1930   Complex Care Management Outreach Attempts: An unsuccessful telephone outreach was attempted today to offer the patient information about available complex care management services.  Follow Up Plan:  Additional outreach attempts will be made to offer the patient complex care management information and services.   Encounter Outcome:  No Answer  Kandis Ormond, CMA, [place]  [facility], [facility] Guide Direct Dial: [phone]  Fax: [phone] Website: [email or web address]

## 2024-02-14 NOTE — Progress Notes (Signed)
 Complex Care Management Note  Care Guide Note 02/14/2024 Name: [name] MRN: [id number] DOB: 03-Jan-1931  Vickie Wilson is a 89 y.o. year old female who sees Plotnikov, Oakley Bellman, MD for primary care. I reached out to Cole Daubs by phone today to offer complex care management services.  [name] was given information about Complex Care Management services today including:   The Complex Care Management services include support from the care team which includes your Nurse Care Manager, Clinical Social Worker, or Pharmacist.  The Complex Care Management team is here to help remove barriers to the health concerns and goals most important to you. Complex Care Management services are voluntary, and the patient may decline or stop services at any time by request to their care team member.   Complex Care Management Consent Status: Patient agreed to services and verbal consent obtained.   Follow up plan:  Telephone appointment with complex care management team member scheduled for:  03/01/2024  Encounter Outcome:  Patient Scheduled  Kandis Ormond, CMA [place]  [facility], [facility] Guide Direct Dial: [phone]  Fax: [phone] Website: [email or web address]

## 2024-02-21 ENCOUNTER — Encounter: Payer: Self-pay | Admitting: Internal Medicine

## 2024-02-21 ENCOUNTER — Ambulatory Visit (INDEPENDENT_AMBULATORY_CARE_PROVIDER_SITE_OTHER): Admitting: Internal Medicine

## 2024-02-21 ENCOUNTER — Ambulatory Visit (INDEPENDENT_AMBULATORY_CARE_PROVIDER_SITE_OTHER)

## 2024-02-21 VITALS — BP 116/70 | HR 73 | Temp 98.2°F | Ht 60.0 in | Wt 91.0 lb

## 2024-02-21 DIAGNOSIS — F3342 Major depressive disorder, recurrent, in full remission: Secondary | ICD-10-CM

## 2024-02-21 DIAGNOSIS — M549 Dorsalgia, unspecified: Secondary | ICD-10-CM | POA: Diagnosis not present

## 2024-02-21 DIAGNOSIS — I10 Essential (primary) hypertension: Secondary | ICD-10-CM

## 2024-02-21 DIAGNOSIS — M546 Pain in thoracic spine: Secondary | ICD-10-CM | POA: Insufficient documentation

## 2024-02-21 DIAGNOSIS — E785 Hyperlipidemia, unspecified: Secondary | ICD-10-CM

## 2024-02-21 DIAGNOSIS — G8929 Other chronic pain: Secondary | ICD-10-CM

## 2024-02-21 DIAGNOSIS — F413 Other mixed anxiety disorders: Secondary | ICD-10-CM | POA: Diagnosis not present

## 2024-02-21 MED ORDER — ARIPIPRAZOLE 2 MG PO TABS: 2.0000 mg | ORAL_TABLET | Freq: Two times a day (BID) | ORAL | 1 refills | Status: DC

## 2024-02-21 MED ORDER — AMLODIPINE BESYLATE 5 MG PO TABS: 5.0000 mg | ORAL_TABLET | Freq: Every day | ORAL | 2 refills | Status: DC

## 2024-02-21 MED ORDER — METOPROLOL SUCCINATE ER 25 MG PO TB24: 25.0000 mg | ORAL_TABLET | Freq: Every day | ORAL | 2 refills | Status: DC

## 2024-02-21 MED ORDER — LORAZEPAM 0.5 MG PO TABS: 0.5000 mg | ORAL_TABLET | Freq: Two times a day (BID) | ORAL | 1 refills | Status: AC | PRN

## 2024-02-21 NOTE — Assessment & Plan Note (Signed)
Noncompliance due to behavioral changes Start on Toprol XL

## 2024-02-21 NOTE — Progress Notes (Signed)
 Subjective:  Patient ID: Vickie Wilson, female    DOB: 02-23-1931  Age: 89 y.o. MRN: [id number]  CC: Back Pain (Pt states she is in need for medication to help with her back pain and she also is experiencing depression and is wanting to have medication set in to help her with that as well. Pt is wanting clarification on if she is to take the Abilify  and Lorazepam  as she has not been taking the Lorazepam ... t is also concerned about her weight)   HPI Vickie Wilson presents for depression, anxiety, HTN  C/o Back Pain (Pt states she is in need for medication to help with her back pain) C/o experiencing depression and is wanting to have medication set in to help her with that as well. Pt is wanting clarification on if she is to take the Abilify  and Lorazepam  as she has not been taking the Lorazepam ... t is also concerned about her weight)  Outpatient Medications Prior to Visit  Medication Sig Dispense Refill   levETIRAcetam  (KEPPRA ) 750 MG tablet Take 1 tablet (750 mg total) by mouth 2 (two) times daily. 180 tablet 3   Multiple Vitamins-Minerals (CENTRUM SILVER ) CHEW Chew 1 each by mouth 3 (three) times a week.     amLODipine  (NORVASC ) 5 MG tablet Take 5 mg by mouth daily.     ARIPiprazole  (ABILIFY ) 2 MG tablet TAKE ONE TABLET BY MOUTH DAILY 30 tablet 3   metoprolol  succinate (TOPROL -XL) 25 MG 24 hr tablet Take 1 tablet (25 mg total) by mouth daily. 30 tablet 5   acetaminophen  (TYLENOL  8 HOUR) 650 MG CR tablet Take 1 tablet (650 mg total) by mouth every 8 (eight) hours as needed for pain. (Patient not taking: Reported on 02/21/2024) 100 tablet 3   LORazepam  (ATIVAN ) 0.5 MG tablet Take 1 tablet (0.5 mg total) by mouth 2 (two) times daily as needed for anxiety. (Patient not taking: Reported on 02/21/2024) 60 tablet 1   No facility-administered medications prior to visit.    ROS: Review of Systems  Constitutional:  Negative for activity change, appetite change, chills, fatigue and unexpected  weight change.  HENT:  Negative for congestion, mouth sores and sinus pressure.   Eyes:  Negative for visual disturbance.  Respiratory:  Negative for cough, choking and chest tightness.   Cardiovascular:  Positive for palpitations. Negative for chest pain.  Gastrointestinal:  Negative for abdominal pain and nausea.  Genitourinary:  Negative for difficulty urinating, frequency and vaginal pain.  Musculoskeletal:  Positive for back pain. Negative for gait problem.  Skin:  Negative for pallor and rash.  Neurological:  Negative for dizziness, tremors, weakness, numbness and headaches.  Psychiatric/Behavioral:  Positive for dysphoric mood. Negative for confusion, sleep disturbance and suicidal ideas. The patient is nervous/anxious.     Objective:  BP 116/70   Pulse 73   Temp 98.2 F (36.8 C) (Oral)   Ht 5' (1.524 m)   Wt 91 lb (41.3 kg)   SpO2 98%   BMI 17.77 kg/m   BP Readings from Last 3 Encounters:  02/21/24 116/70  01/26/24 132/80  04/29/23 120/68    Wt Readings from Last 3 Encounters:  02/21/24 91 lb (41.3 kg)  01/26/24 93 lb (42.2 kg)  04/29/23 97 lb 6 oz (44.2 kg)    Physical Exam Constitutional:      General: She is not in acute distress.    Appearance: Normal appearance. She is well-developed.  HENT:     Head: Normocephalic.  Right Ear: External ear normal.     Left Ear: External ear normal.     Nose: Nose normal.  Eyes:     General:        Right eye: No discharge.        Left eye: No discharge.     Conjunctiva/sclera: Conjunctivae normal.     Pupils: Pupils are equal, round, and reactive to light.  Neck:     Thyroid : No thyromegaly.     Vascular: No JVD.     Trachea: No tracheal deviation.  Cardiovascular:     Rate and Rhythm: Normal rate and regular rhythm.     Heart sounds: Normal heart sounds.  Pulmonary:     Effort: No respiratory distress.     Breath sounds: No stridor. No wheezing.  Abdominal:     General: Bowel sounds are normal. There is  no distension.     Palpations: Abdomen is soft. There is no mass.     Tenderness: There is no abdominal tenderness. There is no guarding or rebound.  Musculoskeletal:        General: No tenderness.     Cervical back: Normal range of motion and neck supple. No rigidity.     Right lower leg: No edema.     Left lower leg: No edema.  Lymphadenopathy:     Cervical: No cervical adenopathy.  Skin:    Findings: No erythema or rash.  Neurological:     Mental Status: Mental status is at baseline.     Cranial Nerves: No cranial nerve deficit.     Motor: No abnormal muscle tone.     Coordination: Coordination normal.     Gait: Gait abnormal.     Deep Tendon Reflexes: Reflexes normal.  Psychiatric:        Behavior: Behavior normal.        Thought Content: Thought content normal.        Judgment: Judgment normal.   Back - NT A little upset  Lab Results  Component Value Date   WBC 6.2 04/08/2023   HGB 13.9 04/08/2023   HCT 41.4 04/08/2023   PLT 326 04/08/2023   GLUCOSE 112 (H) 04/08/2023   CHOL 279 (H) 12/29/2021   TRIG 174.0 (H) 12/29/2021   HDL 56.50 12/29/2021   LDLDIRECT 125.6 01/16/2013   LDLCALC 188 (H) 12/29/2021   ALT 12 12/29/2021   AST 23 12/29/2021   NA 133 (L) 04/08/2023   K 3.7 04/08/2023   CL 98 04/08/2023   CREATININE 0.93 04/08/2023   BUN 11 04/08/2023   CO2 25 04/08/2023   TSH 2.74 12/29/2021   INR 0.92 04/22/2018   HGBA1C 6.1 08/22/2019   MICROALBUR 2.8 (H) 09/17/2016    DG Chest Port 1 View Result Date: 04/08/2023 CLINICAL DATA:  Provided history: Palpitations. EXAM: PORTABLE CHEST 1 VIEW COMPARISON:  Prior chest radiograph 12/03/2022 and earlier. FINDINGS: A small portion of the left lateral costophrenic angle is excluded from the field of view. Within this limitation, findings are as follows. Heart size within normal limits. Aortic atherosclerosis. Ill-defined opacity within the lateral left lung base. No appreciable airspace consolidation on the right. No  evidence of pleural effusion or pneumothorax. No acute osseous abnormality identified. Chronic, healed fracture deformity of the right sixth rib. IMPRESSION: 1. A small portion of the left lateral costophrenic angle is excluded from the field of view. Within this limitation, findings are as follows. 2. Ill-defined opacity within the lateral left lung base, which may  reflect atelectasis or pneumonia. 3. Aortic Atherosclerosis (ICD10-I70.0). Electronically Signed   By: Bascom Lily D.O.   On: 04/08/2023 11:46    Assessment & Plan:   Problem List Items Addressed This Visit     Dyslipidemia   Relevant Orders   CBC with Differential/Platelet   TSH   Comprehensive metabolic panel with GFR   Anxiety disorder   Worse.  Susan is complaining of being depressed, anxious and lonely.   Cont w/prn Lorazepam  Increase Abilify  to 2 mg bid  Potential benefits of a long term benzodiazepines  use as well as potential risks  and complications were explained to the patient and were aknowledged. She declined home care RN visit.      Relevant Medications   LORazepam  (ATIVAN ) 0.5 MG tablet   Other Relevant Orders   CBC with Differential/Platelet   TSH   CULTURE, URINE COMPREHENSIVE   Comprehensive metabolic panel with GFR   Depression   Worse.  Michiko is complaining of being depressed, anxious and lonely.   Cont w/prn Lorazepam  Increase Abilify  to 2 mg bid  Potential benefits of a long term benzodiazepines  use as well as potential risks  and complications were explained to the patient and were aknowledged. She declined home care RN visit.      Relevant Medications   LORazepam  (ATIVAN ) 0.5 MG tablet   HTN (hypertension)   Noncompliance due to behavioral changes Start on Toprol  XL      Relevant Medications   metoprolol  succinate (TOPROL -XL) 25 MG 24 hr tablet   amLODipine  (NORVASC ) 5 MG tablet   Other Relevant Orders   CBC with Differential/Platelet   TSH   CULTURE, URINE COMPREHENSIVE    Comprehensive metabolic panel with GFR   Thoracic back pain - Primary   Worse Thoracic spine X ray      Relevant Orders   DG Thoracic Spine 2 View (Completed)      Meds ordered this encounter  Medications   LORazepam  (ATIVAN ) 0.5 MG tablet    Sig: Take 1 tablet (0.5 mg total) by mouth 2 (two) times daily as needed for anxiety.    Dispense:  60 tablet    Refill:  1   metoprolol  succinate (TOPROL -XL) 25 MG 24 hr tablet    Sig: Take 1 tablet (25 mg total) by mouth daily.    Dispense:  90 tablet    Refill:  2   amLODipine  (NORVASC ) 5 MG tablet    Sig: Take 1 tablet (5 mg total) by mouth daily.    Dispense:  90 tablet    Refill:  2   ARIPiprazole  (ABILIFY ) 2 MG tablet    Sig: Take 1 tablet (2 mg total) by mouth 2 (two) times daily.    Dispense:  180 tablet    Refill:  1      Follow-up: Return in about 3 months (around 05/23/2024) for a follow-up visit.  Anitra Barn, MD

## 2024-02-21 NOTE — Assessment & Plan Note (Signed)
 Worse.  Vickie Wilson is complaining of being depressed, anxious and lonely.   Cont w/prn Lorazepam  Increase Abilify  to 2 mg bid  Potential benefits of a long term benzodiazepines  use as well as potential risks  and complications were explained to the patient and were aknowledged. She declined home care RN visit.

## 2024-02-21 NOTE — Assessment & Plan Note (Signed)
 Worse Thoracic spine X ray

## 2024-02-23 ENCOUNTER — Ambulatory Visit: Payer: Self-pay | Admitting: Internal Medicine

## 2024-02-23 ENCOUNTER — Ambulatory Visit: Payer: Self-pay

## 2024-02-23 NOTE — Telephone Encounter (Signed)
 FYI Only or Action Required?: Action required by provider  Patient was last seen in primary care on 02/21/2024 by Plotnikov, Oakley Bellman, MD. Called Nurse Triage reporting No chief complaint on file.. Symptoms began several months ago. Interventions attempted: Nothing. Symptoms are: stable.  Triage Disposition: See PCP Within 2 Weeks See note   Patient/caregiver understands and will follow disposition?: Yes      Copied from CRM [id number]. Topic: Clinical - Medical Advice >> Feb 23, 2024  4:23 PM Luane Rumps D wrote: Reason for CRM: Patient called to inquire about medications that she can take to help her with the back pain she is having, She also asked about the vitamins to take and advised based on Dr. Katheryne Pane message. Reason for Disposition  Back pain is a chronic symptom (recurrent or ongoing AND present > 4 weeks)  Answer Assessment - Initial Assessment Questions 1. ONSET: When did the pain begin?      ------------ Chronic    2. LOCATION: Where does it hurt? (upper, mid or lower back)     ------------ Back    3. SEVERITY: How bad is the pain?  (e.g., Scale 1-10; mild, moderate, or severe)   - MILD (1-3): Doesn't interfere with normal activities.    - MODERATE (4-7): Interferes with normal activities or awakens from sleep.    - SEVERE (8-10): Excruciating pain, unable to do any normal activities.        --------------------- 7/10 when she does feel it.     4. PATTERN: Is the pain constant? (e.g., yes, no; constant, intermittent)      ---- Intermittent 3-4 times a week.    8. MEDICINES: What have you taken so far for the pain? (e.g., nothing, acetaminophen , NSAIDS)     ------------- Aspirin  81mg - No relief.    10. OTHER SYMPTOMS: Do you have any other symptoms? (e.g., fever, abdomen pain, burning with urination, blood in urine)       --------------Denies    Additional Inform: Patient was seen 6/09 regarding Back pain. Patient reports though intermittent, it is  still very bothersome. Requesting advise on medication to take.  Message routed appropriately.  Protocols used: Back Pain-A-AH

## 2024-02-25 NOTE — Telephone Encounter (Signed)
 Spoke With the pt and she has been taking Tylenol  and it has been helping as needed for her back pain. Pt will continue to take multi-vitamin as well.

## 2024-02-25 NOTE — Telephone Encounter (Addendum)
 She is supposed to take Centrum Silver  chewable -please see her med list.  Take it daily.  Thanks

## 2024-03-01 ENCOUNTER — Other Ambulatory Visit: Payer: Self-pay | Admitting: *Deleted

## 2024-03-02 NOTE — Patient Instructions (Signed)
 Visit Information  Thank you for taking time to visit with me today. Please don't hesitate to contact me if I can be of assistance to you before our next scheduled appointment.  Our next appointment is by telephone on 03/16/24 at 2:30pm Please call the care guide team at [phone] if you need to cancel or reschedule your appointment.   Following is a copy of your care plan:   Goals Addressed             This Visit's Progress    VBCI Social Work Care Plan       Problems:   Lacks knowledge of how to connect to community resources to increase socialization  CSW Clinical Goal(s):   Over the next 90 days the Patient will work with Child psychotherapist to address concerns related to [employer] needs to increase socialization.  Interventions:  Social Determinants of Health in Patient with Depression with Anxiety: SDOH assessments completed: Depression  , Food Insecurity , Housing , Intimate Partner Violence, and Transportation Evaluation of current treatment plan related to unmet needs Confirmed main focus is increasing activity and socialization-denies mental heath follow up at this time. Confirmed that daughter assists with transportation to medical appointments and grocery shopping-patient would like follow up with Meals [facility] Wheels Patient interested in increasing socialization-[facility] Transportation resources: [facility] Mid[facility]) CSW will assist patient with application for   Patient Goals/Self-Care Activities: Anticipate call from [employer] [facility] to complete screening. Continue taking your medication as prescribed Contact your provider with any new concerns or questions    Plan:   Telephone follow up appointment with care management team member scheduled for:  03/16/24        Please call 911 if you are experiencing a Mental Health or Behavioral Health Crisis or need someone to talk  to.  Patient verbalizes understanding of instructions and care plan provided today and agrees to view in MyChart. Active MyChart status and patient understanding of how to access instructions and care plan via MyChart confirmed with patient.      Mikiah Durall, LCSW [place]  [facility], [facility] Licensed Clinical Social Worker  Direct Dial: [phone]

## 2024-03-02 NOTE — Patient Outreach (Signed)
 Complex Care Management   Visit Note  03/02/2024  Name:  Vickie Wilson MRN: [id number] DOB: May 21, 1931  Situation: Referral received for Complex Care Management related to SDOH Barriers:  Transportation Social Isolation I obtained verbal consent from Patient.  Visit completed with patient  on the phone on 03/01/24.  Background:   Past Medical History:  Diagnosis Date   Allergy    Anxiety    Atrial mass    right   Constipation    Depression    Diabetes mellitus    Hyperlipidemia    Hypertension    Osteoporosis    Seizures (HCC)     Assessment: Patient Reported Symptoms:  Cognitive Cognitive Status: Alert and oriented to person, place, and time, Insightful and able to interpret abstract concepts, Normal speech and language skills Cognitive/Intellectual Conditions Management [RPT]: None reported or documented in medical history or problem list      Neurological Neurological Review of Symptoms: No symptoms reported    HEENT HEENT Symptoms Reported: Change or loss of hearing (ear wax removed) HEENT Conditions: Ear problem(s) HEENT Management Strategies: Routine screening HEENT Comment: Saw ENT about 1 month ago Ear problem(s)  Cardiovascular Cardiovascular Symptoms Reported: No symptoms reported    Respiratory Respiratory Symptoms Reported: No symptoms reported    Endocrine Patient reports the following symptoms related to hypoglycemia or hyperglycemia : No symptoms reported Is patient diabetic?: No    Gastrointestinal Gastrointestinal Symptoms Reported: No symptoms reported      Genitourinary Genitourinary Symptoms Reported: No symptoms reported    Integumentary Integumentary Symptoms Reported: No symptoms reported    Musculoskeletal Musculoskelatal Symptoms Reviewed: Unsteady gait Additional Musculoskeletal Details: unsteady gait-have to be careful when walking (does not want to use a cain or a walker)   Falls in the past year?: No Number of falls in past year: 1  or less Was there an injury with Fall?: No Fall Risk Category Calculator: 0 Patient Fall Risk Level: Low Fall Risk Patient at Risk for Falls Due to: Impaired balance/gait Fall risk Follow up: Falls prevention discussed  Psychosocial       Quality of Family Relationships: supportive Do you feel physically threatened by others?: No      03/01/2024   10:43 AM  Depression screen PHQ 2/9  Decreased Interest 0  Down, Depressed, Hopeless 2  PHQ - 2 Score 2  Altered sleeping 1  Tired, decreased energy 0  Change in appetite 0  Feeling bad or failure about yourself  0  Trouble concentrating 0  Moving slowly or fidgety/restless 0  Suicidal thoughts 0  PHQ-9 Score 3      03/01/2024   10:59 AM  GAD 7 : Generalized Anxiety Score  Nervous, Anxious, on Edge 1  Control/stop worrying 0  Worry too much - different things 1  Trouble relaxing 0  Restless 1  Easily annoyed or irritable 2  Afraid - awful might happen 0  Total GAD 7 Score 5  Anxiety Difficulty Somewhat difficult      There were no vitals filed for this visit.  Medications Reviewed Today     Reviewed by Ave Leisure, LCSW (Social Worker) on 03/01/24 at 1040  Med List Status: <None>   Medication Order Taking? Sig Documenting Provider Last Dose Status Informant  acetaminophen  (TYLENOL  8 HOUR) 650 MG CR tablet [id number]  Take 1 tablet (650 mg total) by mouth every 8 (eight) hours as needed for pain.  Patient not taking: Reported on 03/01/2024   Plotnikov, Aleksei  V, MD  Active   amLODipine  (NORVASC ) 5 MG tablet [id number] Yes Take 1 tablet (5 mg total) by mouth daily. Plotnikov, Aleksei V, MD  Active   ARIPiprazole  (ABILIFY ) 2 MG tablet [id number] Yes Take 1 tablet (2 mg total) by mouth 2 (two) times daily. Plotnikov, Aleksei V, MD  Active   levETIRAcetam  (KEPPRA ) 750 MG tablet [id number] Yes Take 1 tablet (750 mg total) by mouth 2 (two) times daily. Johny Nap, NP  Active   LORazepam  (ATIVAN ) 0.5 MG tablet [id number]  Yes Take 1 tablet (0.5 mg total) by mouth 2 (two) times daily as needed for anxiety. Plotnikov, Aleksei V, MD  Active   metoprolol  succinate (TOPROL -XL) 25 MG 24 hr tablet [id number] Yes Take 1 tablet (25 mg total) by mouth daily. Plotnikov, Aleksei V, MD  Active   Multiple Vitamins-Minerals (CENTRUM SILVER ) CHEW [id number] Yes Chew 1 each by mouth 3 (three) times a week. [provider]  Active Self            Recommendation:   PCP Follow-up CSW to refer patient to Meals on Wheels, Transportation services, and activities with the [employer].  Follow Up Plan:   CSW to follow up with patient on 04/05/24   Michaelle Adolphus, LCSW [place]  [facility], [facility] Licensed Clinical Social Worker  Direct Dial: [phone]

## 2024-03-16 ENCOUNTER — Other Ambulatory Visit: Payer: Self-pay | Admitting: *Deleted

## 2024-03-16 NOTE — Patient Outreach (Addendum)
 Complex Care Management   Visit Note  03/16/2024  Name:  Vickie Wilson MRN: [id number] DOB: 1930-12-31  Situation: Referral received for Complex Care Management related to SDOH Barriers:  Transportation Social Isolation I obtained verbal consent from Patient.  Visit completed with patient  on the phone. Background:   Past Medical History:  Diagnosis Date   Allergy    Anxiety    Atrial mass    right   Constipation    Depression    Diabetes mellitus    Hyperlipidemia    Hypertension    Osteoporosis    Seizures (HCC)     Assessment: Patient Reported Symptoms:  Cognitive Cognitive Status: Alert and oriented to person, place, and time, Insightful and able to interpret abstract concepts, Normal speech and language skills Cognitive/Intellectual Conditions Management [RPT]: None reported or documented in medical history or problem list   Health Maintenance Behaviors: Annual physical exam, Stress management Healing Pattern: Average Health Facilitated by: Rest  Neurological Neurological Review of Symptoms: No symptoms reported    HEENT HEENT Symptoms Reported: No symptoms reported      Cardiovascular Cardiovascular Symptoms Reported: No symptoms reported    Respiratory Respiratory Symptoms Reported: No symptoms reported    Endocrine Endocrine Symptoms Reported: No symptoms reported    Gastrointestinal Gastrointestinal Symptoms Reported: No symptoms reported      Genitourinary Genitourinary Symptoms Reported: No symptoms reported    Integumentary Integumentary Symptoms Reported: No symptoms reported    Musculoskeletal Musculoskelatal Symptoms Reviewed: Back pain, Difficulty walking, Unsteady gait, Limited mobility Additional Musculoskeletal Details: back pain continues -picked up a gallon jug of water and injured her back-3 or 4 months ago Musculoskeletal Management Strategies: Adequate rest, Medication therapy, Routine screening Musculoskeletal Self-Management Outcome: 3  (uncertain)      Psychosocial Psychosocial Symptoms Reported: Depression - if selected complete PHQ 2-9 Additional Psychological Details: difficult week as far as depression, daughter out of town this week and has not been able to help-different people helping out with meals while daughter going Behavioral Management Strategies: Adequate rest, Coping strategies, Medication therapy Major Change/Loss/Stressor/Fears (CP): Medical condition, self Behaviors When Feeling Stressed/Fearful: I wan tot get out more to do things friends come by to check on her but not always Techniques to Cope with Loss/Stress/Change: Counseling, Spiritual practice(s) Quality of Family Relationships: supportive Do you feel physically threatened by others?: No      03/01/2024   10:43 AM  Depression screen PHQ 2/9  Decreased Interest 0  Down, Depressed, Hopeless 2  PHQ - 2 Score 2  Altered sleeping 1  Tired, decreased energy 0  Change in appetite 0  Feeling bad or failure about yourself  0  Trouble concentrating 0  Moving slowly or fidgety/restless 0  Suicidal thoughts 0  PHQ-9 Score 3    There were no vitals filed for this visit.  Medications Reviewed Today     Reviewed by Ermalinda Lenn CHRISTELLA, LCSW (Social Worker) on 03/16/24 at 1523  Med List Status: <None>   Medication Order Taking? Sig Documenting Provider Last Dose Status Informant  acetaminophen  (TYLENOL  8 HOUR) 650 MG CR tablet [id number]  Take 1 tablet (650 mg total) by mouth every 8 (eight) hours as needed for pain.  Patient not taking: Reported on 03/16/2024   Plotnikov, Aleksei V, MD  Active   amLODipine  (NORVASC ) 5 MG tablet [id number] Yes Take 1 tablet (5 mg total) by mouth daily. Plotnikov, Aleksei V, MD  Active   ARIPiprazole  (ABILIFY ) 2 MG tablet [id number] Yes  Take 1 tablet (2 mg total) by mouth 2 (two) times daily. Plotnikov, Aleksei V, MD  Active   levETIRAcetam  (KEPPRA ) 750 MG tablet [id number] Yes Take 1 tablet (750 mg total) by mouth 2  (two) times daily. Whitfield Raisin, NP  Active   LORazepam  (ATIVAN ) 0.5 MG tablet [id number] Yes Take 1 tablet (0.5 mg total) by mouth 2 (two) times daily as needed for anxiety. Plotnikov, Aleksei V, MD  Active   metoprolol  succinate (TOPROL -XL) 25 MG 24 hr tablet [id number] Yes Take 1 tablet (25 mg total) by mouth daily. Plotnikov, Aleksei V, MD  Active   Multiple Vitamins-Minerals (CENTRUM SILVER ) CHEW [id number] Yes Chew 1 each by mouth 3 (three) times a week. [provider]  Active Self            Recommendation:   PCP Follow-up Continue to take medications as prescirbed [facility] application to [facility] completed [facility]  Follow Up Plan:   Telephone follow-up 04/06/24  Lenn Mean, LCSW [place]  [facility], [facility] Licensed Clinical Social Worker  Direct Dial: [phone]

## 2024-03-16 NOTE — Patient Instructions (Signed)
 Visit Information  Thank you for taking time to visit with me today. Please don't hesitate to contact me if I can be of assistance to you before our next scheduled appointment.  Your next care management appointment is by telephone on 04/06/24 at 2pm  Telephone follow-up 04/06/24  Please call the care guide team at [phone] if you need to cancel, schedule, or reschedule an appointment.   Please call the Suicide and Crisis Lifeline: 988 if you are experiencing a Mental Health or Behavioral Health Crisis or need someone to talk to.   Catrell Morrone, LCSW [place]  [facility], [facility] Licensed Clinical Social Worker  Direct Dial: [phone]

## 2024-04-06 ENCOUNTER — Other Ambulatory Visit: Payer: Self-pay | Admitting: *Deleted

## 2024-04-07 NOTE — Patient Instructions (Signed)
 Visit Information  Thank you for taking time to visit with me today. Please don't hesitate to contact me if I can be of assistance to you before our next scheduled appointment.   no further scheduled appointments.     Please call the care guide team at [phone] if you need to cancel, schedule, or reschedule an appointment.   Please call 911 if you are experiencing a Mental Health or Behavioral Health Crisis or need someone to talk to.  Virgel Haro, LCSW [place]  [facility], [facility] Licensed Clinical Social Worker  Direct Dial: [phone]

## 2024-04-07 NOTE — Patient Outreach (Signed)
 Complex Care Management   Visit Note  04/07/2024  Name:  Vickie Wilson MRN: [id number] DOB: Mar 07, 1931  Situation: Referral received for Complex Care Management related to SDOH Barriers:  Transportation Social Isolation I obtained verbal consent from Patient.  Visit completed with patient  on the phone on 04/06/24  Background:   Past Medical History:  Diagnosis Date   Allergy    Anxiety    Atrial mass    right   Constipation    Depression    Diabetes mellitus    Hyperlipidemia    Hypertension    Osteoporosis    Seizures (HCC)     Assessment: Patient Reported Symptoms:  Cognitive Cognitive Status: Alert and oriented to person, place, and time, Insightful and able to interpret abstract concepts, Normal speech and language skills Cognitive/Intellectual Conditions Management [RPT]: None reported or documented in medical history or problem list   Health Maintenance Behaviors: Annual physical exam, Stress management Healing Pattern: Average Health Facilitated by: Rest  Neurological Neurological Review of Symptoms: No symptoms reported    HEENT HEENT Symptoms Reported: Change or loss of hearing (recently had ear wax removed) HEENT Comment: ear wax removed 2 months ago, helped some with hearing but feels she may need hearing aids at some point    Cardiovascular Cardiovascular Symptoms Reported: No symptoms reported    Respiratory Respiratory Symptoms Reported: No symptoms reported    Endocrine Endocrine Symptoms Reported: No symptoms reported    Gastrointestinal Gastrointestinal Symptoms Reported: Cramping Additional Gastrointestinal Details: Per patient, has miralax  for constipation Gastrointestinal Management Strategies: Medication therapy    Genitourinary Genitourinary Symptoms Reported: No symptoms reported    Integumentary Integumentary Symptoms Reported: No symptoms reported    Musculoskeletal Musculoskelatal Symptoms Reviewed: Back pain, Difficulty walking,  Unsteady gait, Weakness Musculoskeletal Management Strategies: Adequate rest, Medication therapy, Routine screening      Psychosocial Psychosocial Symptoms Reported: Depression - if selected complete PHQ 2-9 Additional Psychological Details: patient continues to report feelings of loneliness-has people that visit but per patient, she is often home alone, declines further follow up with the [facility]  stating that she does not feel strong enough to get there by the senior  transportation available. [place] further declines assisted living placement Behavioral Management Strategies: Adequate rest, Coping strategies, Medication therapy Major Change/Loss/Stressor/Fears (CP): Medical condition, self Behaviors When Feeling Stressed/Fearful: watch television, phone calls and visits with friends 1-2x per week, friends take her to lunch Techniques to [place] with Loss/Stress/Change: Counseling, Spiritual practice(s) Quality of Family Relationships: supportive Do you feel physically threatened by others?: No      03/01/2024   10:43 AM  Depression screen PHQ 2/9  Decreased Interest 0  Down, Depressed, Hopeless 2  PHQ - 2 Score 2  Altered sleeping 1  Tired, decreased energy 0  Change in appetite 0  Feeling bad or failure about yourself  0  Trouble concentrating 0  Moving slowly or fidgety/restless 0  Suicidal thoughts 0  PHQ-9 Score 3    There were no vitals filed for this visit.  Medications Reviewed Today   Medications were not reviewed in this encounter     Recommendation:   PCP Follow-up  Follow Up Plan:   Patient has met all care management goals. Care Management case will be closed. Patient has been provided contact information should new needs arise.   Tylor Gambrill, LCSW [place]  [facility], [facility] Licensed Clinical Social Worker  Direct Dial: [phone]

## 2024-04-20 ENCOUNTER — Ambulatory Visit: Payer: Self-pay

## 2024-04-20 NOTE — Telephone Encounter (Signed)
 FYI Only or Action Required?: FYI only for provider.  Patient was last seen in primary care on 02/21/2024 by Plotnikov, Karlynn GAILS, MD.  Called Nurse Triage reporting Back Pain.  Symptoms began chronic.  Interventions attempted: OTC medications: tylenol  and Rest, hydration, or home remedies.  Symptoms are: gradually worsening.  Triage Disposition: See PCP When Office is Open (Within 3 Days)  Patient/caregiver understands and will follow disposition?: Yes, but will wait   Copied from CRM #[id number]. Topic: Clinical - Red Word Triage >> Apr 20, 2024  1:24 PM Jayma L wrote: Red Word that prompted transfer to Nurse Triage: patient called in and said she's having some back pain that's all over her back and getting worse since last seen, asking what she can do because tylenol  isn't working anymore Reason for Disposition  [1] MODERATE back pain (e.g., interferes with normal activities) AND [2] present > 3 days  Answer Assessment - Initial Assessment Questions 1. ONSET: When did the pain begin? (e.g., minutes, hours, days)     Chronic-has had imaging few months ago 2. LOCATION: Where does it hurt? (upper, mid or lower back)     All over back 3. SEVERITY: How bad is the pain?  (e.g., Scale 1-10; mild, moderate, or severe)     Moderate-worsening 4. PATTERN: Is the pain constant? (e.g., yes, no; constant, intermittent)      Improves with Tylenol  but returns by next day 5. RADIATION: Does the pain shoot into your legs or somewhere else?     denies 6. CAUSE:  What do you think is causing the back pain?      unsure 7. BACK OVERUSE:  Any recent lifting of heavy objects, strenuous work or exercise?     No 8. MEDICINES: What have you taken so far for the pain? (e.g., nothing, acetaminophen , NSAIDS)     Tylenol   9. NEUROLOGIC SYMPTOMS: Do you have any weakness, numbness, or problems with bowel/bladder control?     Denies  10. OTHER SYMPTOMS: Do you have any other symptoms?  (e.g., fever, abdomen pain, burning with urination, blood in urine)       Walking hunched due to pain.    Additional info: 1)Tylenol  with effect but it comes right back the next day 2) Offered next available acute visit but patient would prefer to see pcp, she declines pcp next available acute visit on 04/24/24 due to too early in the day, she accepts acute visit with pcp on 04/26/24, she will call back if pain becomes constant, severe, or develops new symptoms.  Protocols used: Back Pain-A-AH

## 2024-04-26 ENCOUNTER — Encounter: Payer: Self-pay | Admitting: Internal Medicine

## 2024-04-26 ENCOUNTER — Ambulatory Visit (INDEPENDENT_AMBULATORY_CARE_PROVIDER_SITE_OTHER): Admitting: Internal Medicine

## 2024-04-26 VITALS — BP 134/83 | HR 67 | Temp 97.6°F | Ht 60.0 in | Wt 89.0 lb

## 2024-04-26 DIAGNOSIS — K5901 Slow transit constipation: Secondary | ICD-10-CM

## 2024-04-26 DIAGNOSIS — R634 Abnormal weight loss: Secondary | ICD-10-CM | POA: Diagnosis not present

## 2024-04-26 DIAGNOSIS — I10 Essential (primary) hypertension: Secondary | ICD-10-CM | POA: Diagnosis not present

## 2024-04-26 DIAGNOSIS — F3342 Major depressive disorder, recurrent, in full remission: Secondary | ICD-10-CM | POA: Diagnosis not present

## 2024-04-26 LAB — CBC WITH DIFFERENTIAL/PLATELET
Basophils Absolute: 0 K/uL (ref 0.0–0.1)
Basophils Relative: 0.3 % (ref 0.0–3.0)
Eosinophils Absolute: 0.1 K/uL (ref 0.0–0.7)
Eosinophils Relative: 1.7 % (ref 0.0–5.0)
HCT: 38 % (ref 36.0–46.0)
Hemoglobin: 12.6 g/dL (ref 12.0–15.0)
Lymphocytes Relative: 45.2 % (ref 12.0–46.0)
Lymphs Abs: 2.8 K/uL (ref 0.7–4.0)
MCHC: 33.1 g/dL (ref 30.0–36.0)
MCV: 85 fl (ref 78.0–100.0)
Monocytes Absolute: 0.6 K/uL (ref 0.1–1.0)
Monocytes Relative: 9.5 % (ref 3.0–12.0)
Neutro Abs: 2.7 K/uL (ref 1.4–7.7)
Neutrophils Relative %: 43.3 % (ref 43.0–77.0)
Platelets: 301 K/uL (ref 150.0–400.0)
RBC: 4.47 Mil/uL (ref 3.87–5.11)
RDW: 14.5 % (ref 11.5–15.5)
WBC: 6.1 K/uL (ref 4.0–10.5)

## 2024-04-26 LAB — URINALYSIS, ROUTINE W REFLEX MICROSCOPIC
Bilirubin Urine: NEGATIVE
Nitrite: POSITIVE — AB
Specific Gravity, Urine: 1.025 (ref 1.000–1.030)
Total Protein, Urine: 30 — AB
Urine Glucose: NEGATIVE
Urobilinogen, UA: 0.2 (ref 0.0–1.0)
pH: 6 (ref 5.0–8.0)

## 2024-04-26 LAB — COMPREHENSIVE METABOLIC PANEL WITH GFR
ALT: 9 U/L (ref 0–35)
AST: 18 U/L (ref 0–37)
Albumin: 4.3 g/dL (ref 3.5–5.2)
Alkaline Phosphatase: 43 U/L (ref 39–117)
BUN: 19 mg/dL (ref 6–23)
CO2: 30 meq/L (ref 19–32)
Calcium: 9.2 mg/dL (ref 8.4–10.5)
Chloride: 101 meq/L (ref 96–112)
Creatinine, Ser: 0.92 mg/dL (ref 0.40–1.20)
GFR: 54.54 mL/min — ABNORMAL LOW (ref >60.00)
Glucose, Bld: 94 mg/dL (ref 70–99)
Potassium: 3.9 meq/L (ref 3.5–5.1)
Sodium: 138 meq/L (ref 135–145)
Total Bilirubin: 0.5 mg/dL (ref 0.2–1.2)
Total Protein: 7 g/dL (ref 6.0–8.3)

## 2024-04-26 LAB — TSH: TSH: 2.32 u[IU]/mL (ref 0.35–5.50)

## 2024-04-26 LAB — T4, FREE: Free T4: 0.87 ng/dL (ref 0.60–1.60)

## 2024-04-26 NOTE — Assessment & Plan Note (Signed)
 Worse.  Vickie Wilson is complaining of being depressed, anxious and lonely.   Cont w/prn Lorazepam  Abilify  2 mg bid  Potential benefits of a long term benzodiazepines, Abilify  use as well as potential risks  and complications were explained to the patient and were aknowledged. She declined home care RN visit.

## 2024-04-26 NOTE — Assessment & Plan Note (Signed)
 Mild.

## 2024-04-26 NOTE — Progress Notes (Signed)
 Subjective:  Patient ID: Vickie Wilson, female    DOB: [birth year]  Age: 89 y.o. MRN: [id number]  CC: Back Pain (Discuss back pain nd help with gaining weight)   HPI Vickie Wilson presents for wt loss, anxiety, depression, HTN Eating 3 meals a day, good food  Outpatient Medications Prior to Visit  Medication Sig Dispense Refill   amLODipine  (NORVASC ) 5 MG tablet Take 1 tablet (5 mg total) by mouth daily. 90 tablet 2   ARIPiprazole  (ABILIFY ) 2 MG tablet Take 1 tablet (2 mg total) by mouth 2 (two) times daily. 180 tablet 1   levETIRAcetam  (KEPPRA ) 750 MG tablet Take 1 tablet (750 mg total) by mouth 2 (two) times daily. 180 tablet 3   LORazepam  (ATIVAN ) 0.5 MG tablet Take 1 tablet (0.5 mg total) by mouth 2 (two) times daily as needed for anxiety. 60 tablet 1   metoprolol  succinate (TOPROL -XL) 25 MG 24 hr tablet Take 1 tablet (25 mg total) by mouth daily. 90 tablet 2   Multiple Vitamins-Minerals (CENTRUM SILVER ) CHEW Chew 1 each by mouth 3 (three) times a week.     acetaminophen  (TYLENOL  8 HOUR) 650 MG CR tablet Take 1 tablet (650 mg total) by mouth every 8 (eight) hours as needed for pain. (Patient not taking: Reported on 04/26/2024) 100 tablet 3   No facility-administered medications prior to visit.    ROS: Review of Systems  Constitutional:  Positive for fatigue and unexpected weight change. Negative for activity change, appetite change and chills.  HENT:  Negative for congestion, mouth sores and sinus pressure.   Eyes:  Negative for visual disturbance.  Respiratory:  Negative for cough and chest tightness.   Gastrointestinal:  Negative for abdominal pain and nausea.  Genitourinary:  Negative for difficulty urinating, frequency and vaginal pain.  Musculoskeletal:  Positive for back pain and gait problem.  Skin:  Negative for pallor and rash.  Neurological:  Negative for dizziness, tremors, weakness, numbness and headaches.  Hematological:  Bruises/bleeds easily.   Psychiatric/Behavioral:  Positive for behavioral problems, decreased concentration and dysphoric mood. Negative for confusion, hallucinations, self-injury, sleep disturbance and suicidal ideas. The patient is nervous/anxious.     Objective:  BP 134/83   Pulse 67   Temp 97.6 F (36.4 C) (Oral)   Ht 5' (1.524 m)   Wt 89 lb (40.4 kg)   SpO2 97%   BMI 17.38 kg/m   BP Readings from Last 3 Encounters:  04/26/24 134/83  02/21/24 116/70  01/26/24 132/80    Wt Readings from Last 3 Encounters:  04/26/24 89 lb (40.4 kg)  02/21/24 91 lb (41.3 kg)  01/26/24 93 lb (42.2 kg)    Physical Exam Constitutional:      General: She is not in acute distress.    Appearance: Normal appearance. She is well-developed.  HENT:     Head: Normocephalic.     Right Ear: External ear normal.     Left Ear: External ear normal.     Nose: Nose normal.  Eyes:     General:        Right eye: No discharge.        Left eye: No discharge.     Conjunctiva/sclera: Conjunctivae normal.     Pupils: Pupils are equal, round, and reactive to light.  Neck:     Thyroid : No thyromegaly.     Vascular: No JVD.     Trachea: No tracheal deviation.  Cardiovascular:     Rate and Rhythm: Normal  rate and regular rhythm.     Heart sounds: Normal heart sounds.  Pulmonary:     Effort: No respiratory distress.     Breath sounds: No stridor. No wheezing.  Abdominal:     General: Bowel sounds are normal. There is no distension.     Palpations: Abdomen is soft. There is no mass.     Tenderness: There is no abdominal tenderness. There is no guarding or rebound.  Musculoskeletal:        General: No tenderness.     Cervical back: Normal range of motion and neck supple. No rigidity.  Lymphadenopathy:     Cervical: No cervical adenopathy.  Skin:    Findings: No erythema or rash.  Neurological:     Mental Status: Mental status is at baseline.     Cranial Nerves: No cranial nerve deficit.     Motor: No abnormal muscle tone.      Coordination: Coordination normal.     Deep Tendon Reflexes: Reflexes normal.  Psychiatric:        Behavior: Behavior normal.        Thought Content: Thought content normal.   The patient looks younger than her stated age.  She appears sad. Normal gait for age  Lab Results  Component Value Date   WBC 6.1 04/26/2024   HGB 12.6 04/26/2024   HCT 38.0 04/26/2024   PLT 301.0 04/26/2024   GLUCOSE 94 04/26/2024   CHOL 279 (H) 12/29/2021   TRIG 174.0 (H) 12/29/2021   HDL 56.50 12/29/2021   LDLDIRECT 125.6 01/16/2013   LDLCALC 188 (H) 12/29/2021   ALT 9 04/26/2024   AST 18 04/26/2024   NA 138 04/26/2024   K 3.9 04/26/2024   CL 101 04/26/2024   CREATININE 0.92 04/26/2024   BUN 19 04/26/2024   CO2 30 04/26/2024   TSH 2.32 04/26/2024   INR 0.92 04/22/2018   HGBA1C 6.1 08/22/2019    DG Chest Port 1 View Result Date: 04/08/2023 CLINICAL DATA:  Provided history: Palpitations. EXAM: PORTABLE CHEST 1 VIEW COMPARISON:  Prior chest radiograph 12/03/2022 and earlier. FINDINGS: A small portion of the left lateral costophrenic angle is excluded from the field of view. Within this limitation, findings are as follows. Heart size within normal limits. Aortic atherosclerosis. Ill-defined opacity within the lateral left lung base. No appreciable airspace consolidation on the right. No evidence of pleural effusion or pneumothorax. No acute osseous abnormality identified. Chronic, healed fracture deformity of the right sixth rib. IMPRESSION: 1. A small portion of the left lateral costophrenic angle is excluded from the field of view. Within this limitation, findings are as follows. 2. Ill-defined opacity within the lateral left lung base, which may reflect atelectasis or pneumonia. 3. Aortic Atherosclerosis (ICD10-I70.0). Electronically Signed   By: Rockey Childs D.O.   On: 04/08/2023 11:46    Assessment & Plan:   Problem List Items Addressed This Visit     Constipation   Mild       Depression    Worse.  [name] is complaining of being depressed, anxious and lonely.   Cont w/prn Lorazepam  Abilify  2 mg bid  Potential benefits of a long term benzodiazepines, Abilify  use as well as potential risks  and complications were explained to the patient and were aknowledged. She declined home care RN visit.      Relevant Orders   T4, free (Completed)   TSH (Completed)   Comprehensive metabolic panel with GFR (Completed)   Urinalysis   CBC with Differential/Platelet (  Completed)   HTN (hypertension)   Noncompliance due to behavioral changes Start on Toprol  XL      Relevant Orders   T4, free (Completed)   TSH (Completed)   Comprehensive metabolic panel with GFR (Completed)   Urinalysis   CBC with Differential/Platelet (Completed)   Weight loss - Primary   ?etiology. Eating 3 meals a day, good food. This could be normal, age related. Eat more. Monitor wt. Check labs incl TSH Try Ensure plus      Relevant Orders   T4, free (Completed)   TSH (Completed)   Comprehensive metabolic panel with GFR (Completed)   Urinalysis   CBC with Differential/Platelet (Completed)      No orders of the defined types were placed in this encounter.     Follow-up: Return in about 3 months (around 07/27/2024) for a follow-up visit.  Marolyn Noel, MD

## 2024-04-26 NOTE — Patient Instructions (Signed)
 Try Ensure plus

## 2024-04-26 NOTE — Assessment & Plan Note (Addendum)
?  etiology. Eating 3 meals a day, good food. This could be normal, age related. Eat more. Monitor wt. Check labs incl TSH Try Ensure plus

## 2024-04-26 NOTE — Assessment & Plan Note (Signed)
Noncompliance due to behavioral changes Start on Toprol XL

## 2024-04-30 ENCOUNTER — Ambulatory Visit: Payer: Self-pay | Admitting: Internal Medicine

## 2024-04-30 MED ORDER — NITROFURANTOIN MONOHYD MACRO 100 MG PO CAPS: 100.0000 mg | ORAL_CAPSULE | Freq: Two times a day (BID) | ORAL | 0 refills | Status: DC

## 2024-05-03 NOTE — Telephone Encounter (Signed)
 Copied from CRM [id number]. Topic: Clinical - Lab/Test Results >> May 03, 2024 12:07 PM Frederich PARAS wrote: Reason for CRM: pt calling she want to know why it took a week for her to find out she has a uti. Pt call back # is [phone]

## 2024-05-03 NOTE — Telephone Encounter (Signed)
 Copied from CRM #[id number]. Topic: Clinical - Medication Question >> May 03, 2024 10:50 AM Charolett L wrote: Reason for CRM: patient is wanting a call back from [facility] to inform her that the recent tests are stable, except for an abnormal urinary test suggestive of a urinary tract infection.  We will send antibiotic to her pharmacy. She wants to be explained how she gets a UTI

## 2024-05-08 NOTE — Telephone Encounter (Signed)
 Copied from CRM #[id number]. Topic: Clinical - Medication Question >> May 08, 2024 11:43 AM Viola FALCON wrote: *3rd Request* Patient would like call back regarding the nitrofurantoin , macrocrystal-monohydrate, (MACROBID ) 100 MG capsule. She requested to speak with Dr. Garald or his nurse to give an update on how it's going. Says she never received a call last week. Please call [phone], and she asked to keep calling if she doesn't answer because she gets lot of spam calls.

## 2024-05-11 ENCOUNTER — Ambulatory Visit: Payer: Self-pay

## 2024-05-11 NOTE — Telephone Encounter (Addendum)
 First attempt; no answer Second attempt; left vm Third attempt; no answer Pt says she has been experiencing constipation for some days. She is requesting to speak with a nurse because she has tried to help herself go with minimal relief.  :

## 2024-05-12 ENCOUNTER — Ambulatory Visit: Payer: Self-pay | Admitting: *Deleted

## 2024-05-12 NOTE — Telephone Encounter (Signed)
 Reason for Disposition  Unable to have a bowel movement (BM) without laxative or enema  Answer Assessment - Initial Assessment Questions 1. STOOL PATTERN OR FREQUENCY: How often do you have a bowel movement (BM)?  (Normal range: 3 times a day to every 3 days)  When was your last BM?       I'm constipated.   I have intermittent constipation.    Last time I had a BM yesterday and this morning.  I want something to take to help me with this.   I drank a lot of water and that helped.   2. STRAINING: Do you have to strain to have a BM?      Yes 3. ONSET: When did the constipation begin?      This has been going on for a while now.    I don't walk a lot so that is probably contributing to it.    I've had to take an antibiotic recently for my back.   Not on pain medicine.   I took Tylenol  yesterday for my back.   I was in to see him a week and a half ago.   I took the antibiotic for the last 7 days.    I've been having the back pain for a month or two.  Dr. Garald did a blood test to find out I needed the antibiotic.   This was 2 weeks ago. What should I do about not being able to have a BM 4. RECTAL PAIN: Does your rectum hurt when the stool comes out? If Yes, ask: Do you have hemorrhoids? How bad is the pain?  (Scale 1-10; or mild, moderate, severe) I have to strain.    No blood.      5. BM COMPOSITION: Are the stools hard?      They are hard to get out the stool is not hard. 6. BLOOD ON STOOLS: Has there been any blood on the toilet tissue or on the surface of the BM? If Yes, ask: When was the last time?     No 7. CHRONIC CONSTIPATION: Is this a new problem for you?  If No, ask: How long have you had this problem? (days, weeks, months)      Yes I have this on and off 8. CHANGES IN DIET OR HYDRATION: Have there been any recent changes in your diet? How much fluids are you drinking on a daily basis?  How much have you had to drink today?     No    I'm drinking a lot  of water.   I'm eating 3 meals a day.  9. MEDICINES: Have you been taking any new medicines? Are you taking any narcotic pain medicines? (e.g., Dilaudid, morphine, Percocet, Vicodin)     No new medicines.    Except the antibiotic I took. 10. LAXATIVES: Have you been using any stool softeners, laxatives, or enemas?  If Yes, ask What are you using, how often, and when was the last time?       No 11. ACTIVITY:  How much walking do you do every day?  Has your activity level decreased in the past week?        I'm not walking a lot.   My legs are get weaker.   I've been weak for a long time in my legs. 12. CAUSE: What do you think is causing the constipation?        I don't know 13. MEDICAL HISTORY: Do you  have a history of hemorrhoids, rectal fissures, rectal surgery, or rectal abscess?         No 14. OTHER SYMPTOMS: Do you have any other symptoms? (e.g., abdomen pain, bloating, fever, vomiting)       My legs are weak so I don't walk a lot.    I have back pain. 15. PREGNANCY: Is there any chance you are pregnant? When was your last menstrual period?       N/A due to age  Protocols used: Constipation-A-AH FYI Only or Action Required?: Action required by provider: clinical question for provider.  Patient was last seen in primary care on 04/26/2024 by Plotnikov, Karlynn GAILS, MD.  Called Nurse Triage reporting Constipation.  Symptoms began about a month ago. On and off for a while having constipation.  Wanting suggestions for what she can take for it.   Wanted a message sent to Dr. Garald.  Interventions attempted: Rest, hydration, or home remedies. Drinking a lot of water which has helped.   Had BM 8/28 and 8/27 but straining to go.  Symptoms are: gradually worsening. Requesting a call back with suggestions from Dr. Garald for constipation.     Triage Disposition: Call PCP Now  Patient/caregiver understands and will follow disposition?: Yes

## 2024-05-15 NOTE — Telephone Encounter (Signed)
 Use MiraLAX  1 scoop or packet twice a day and Dulcolax 1 tablet once or twice a day.  Hydrate well.  Office visit if problems.  Thank you

## 2024-05-16 ENCOUNTER — Telehealth: Payer: Self-pay

## 2024-05-16 DIAGNOSIS — K5900 Constipation, unspecified: Secondary | ICD-10-CM

## 2024-05-16 MED ORDER — POLYETHYLENE GLYCOL 3350 17 GM/SCOOP PO POWD: ORAL | 1 refills | Status: AC

## 2024-05-16 MED ORDER — BISACODYL EC 5 MG PO TBEC: 5.0000 mg | DELAYED_RELEASE_TABLET | Freq: Two times a day (BID) | ORAL | 0 refills | Status: AC | PRN

## 2024-05-16 NOTE — Telephone Encounter (Unsigned)
 Copied from CRM [id number]. Topic: General - Other >> May 16, 2024 10:06 AM Delon DASEN wrote: Reason for CRM: missed call from office- please call [phone]- states it is urgent

## 2024-05-16 NOTE — Telephone Encounter (Signed)
 Copied from CRM #[id number]. Topic: Clinical - Pink Word Triage >> May 11, 2024  7:51 AM Mia F wrote: Reason for Triage: Pt says she has been experiencing constipation for some days. She is requesting to speak with a nurse because she has tried to help herself go with minimal relief. >> May 16, 2024  9:14 AM Franky GRADE wrote: Patient is returning a call she received from the office, tried to advise patient of Dr.Plotnikov's instructions but she did not understand and would prefer to speak to a nurse.  >> May 11, 2024  7:54 AM Mia F wrote: Pt says she has been experiencing constipation for some days. She is requesting to speak with a nurse because she has tried to help herself go with minimal relief.

## 2024-05-18 ENCOUNTER — Other Ambulatory Visit: Payer: Self-pay

## 2024-05-18 DIAGNOSIS — G8929 Other chronic pain: Secondary | ICD-10-CM

## 2024-05-18 MED ORDER — DICLOFENAC SODIUM 1 % EX GEL: 2.0000 g | Freq: Four times a day (QID) | CUTANEOUS | 0 refills | Status: AC

## 2024-05-18 MED ORDER — ACETAMINOPHEN ER 650 MG PO TBCR: 650.0000 mg | EXTENDED_RELEASE_TABLET | Freq: Three times a day (TID) | ORAL | Status: AC | PRN

## 2024-05-18 NOTE — Telephone Encounter (Signed)
Spoke with patient, addressed in separate encounter.

## 2024-05-18 NOTE — Telephone Encounter (Signed)
 Spoken with patient, gave her this information. Also called her daughter to give her this information as well, as patient stated she likely will not remember all of this information. Tylenol  has not been prescribed since 2021. Patient did state she has 500mg  tylenol  at home, takes 2-3 tablets daily with not much relief. Mentions she has a heating pad also, but does not work well. Okay to refill Tylenol  650 for patient and prescribe voltaren  gel?

## 2024-05-18 NOTE — Telephone Encounter (Signed)
 Use heat Take Tylenol  650 mg 4 times a day as needed Use topical pain medication, i.e. blue emu or Voltaren  gel.  Office visit with any provider if not better. Thanks

## 2024-05-19 NOTE — Telephone Encounter (Signed)
 Patient notified

## 2024-05-19 NOTE — Telephone Encounter (Signed)
 Tylenol  and Voltaren  gel are over-the-counter-there is no need to have a prescription.  Thanks

## 2024-05-23 ENCOUNTER — Ambulatory Visit: Admitting: Internal Medicine

## 2024-05-23 ENCOUNTER — Encounter: Payer: Self-pay | Admitting: Internal Medicine

## 2024-05-23 VITALS — BP 122/74 | HR 77 | Temp 98.1°F | Ht 60.0 in | Wt 86.8 lb

## 2024-05-23 DIAGNOSIS — R634 Abnormal weight loss: Secondary | ICD-10-CM

## 2024-05-23 DIAGNOSIS — M546 Pain in thoracic spine: Secondary | ICD-10-CM | POA: Diagnosis not present

## 2024-05-23 DIAGNOSIS — Z0000 Encounter for general adult medical examination without abnormal findings: Secondary | ICD-10-CM

## 2024-05-23 DIAGNOSIS — G8929 Other chronic pain: Secondary | ICD-10-CM

## 2024-05-23 DIAGNOSIS — F413 Other mixed anxiety disorders: Secondary | ICD-10-CM

## 2024-05-23 DIAGNOSIS — M2550 Pain in unspecified joint: Secondary | ICD-10-CM

## 2024-05-23 NOTE — Assessment & Plan Note (Signed)
 SABRA

## 2024-05-23 NOTE — Assessment & Plan Note (Signed)
 Wt Readings from Last 3 Encounters:  05/23/24 86 lb 12.8 oz (39.4 kg)  04/26/24 89 lb (40.4 kg)  02/21/24 91 lb (41.3 kg)

## 2024-05-23 NOTE — Progress Notes (Signed)
 Subjective:  Patient ID: Vickie Wilson, female    DOB: December 31, 1930  Age: 89 y.o. MRN: [id number]  CC: Follow-up (Having back pain and constipation and states weakness all the way around. Back pain started 6 months ago. Pretty constate states more times than not she is in pain. Has tried OTC and prescription medication and  states tylenol  helps some. Pain scale: 8. Constipation has been happening. Miralax , applejuice and prune juice made her have diarrhea. )   HPI Vickie Wilson presents for Follow-up - Having back pain and constipation and states weakness all the way around. Back pain started 6 months ago. Pretty constate states more times than not she is in pain. Has tried OTC and prescription medication and  states tylenol  helps some. Pain scale: 8. Constipation has been happening. Miralax , applejuice and prune juice made her have diarrhea.   Here w/her dtr  Outpatient Medications Prior to Visit  Medication Sig Dispense Refill   acetaminophen  (TYLENOL  8 HOUR) 650 MG CR tablet Take 1 tablet (650 mg total) by mouth every 8 (eight) hours as needed for pain.     amLODipine  (NORVASC ) 5 MG tablet Take 1 tablet (5 mg total) by mouth daily. 90 tablet 2   ARIPiprazole  (ABILIFY ) 2 MG tablet Take 1 tablet (2 mg total) by mouth 2 (two) times daily. 180 tablet 1   bisacodyl  5 MG EC tablet Take 1 tablet (5 mg total) by mouth 2 (two) times daily as needed for moderate constipation. 30 tablet 0   levETIRAcetam  (KEPPRA ) 750 MG tablet Take 1 tablet (750 mg total) by mouth 2 (two) times daily. 180 tablet 3   metoprolol  succinate (TOPROL -XL) 25 MG 24 hr tablet Take 1 tablet (25 mg total) by mouth daily. 90 tablet 2   polyethylene glycol powder (GLYCOLAX /MIRALAX ) 17 GM/SCOOP powder Dissolve 1 capful (17g) in 4-8 ounces of liquid and take by mouth 1-2 times daily until you have a BM. May use daily or every other day to continue regular bowel movements. Be sure to hydrate 850 g 1   diclofenac  Sodium (VOLTAREN   ARTHRITIS PAIN) 1 % GEL Apply 2 g topically 4 (four) times daily. (Patient not taking: Reported on 05/23/2024) 350 g 0   LORazepam  (ATIVAN ) 0.5 MG tablet Take 1 tablet (0.5 mg total) by mouth 2 (two) times daily as needed for anxiety. (Patient not taking: Reported on 05/23/2024) 60 tablet 1   Multiple Vitamins-Minerals (CENTRUM SILVER ) CHEW Chew 1 each by mouth 3 (three) times a week. (Patient not taking: Reported on 05/23/2024)     nitrofurantoin , macrocrystal-monohydrate, (MACROBID ) 100 MG capsule Take 1 capsule (100 mg total) by mouth 2 (two) times daily. (Patient not taking: Reported on 05/23/2024) 14 capsule 0   No facility-administered medications prior to visit.    ROS: Review of Systems  Constitutional:  Positive for appetite change, fatigue and unexpected weight change. Negative for activity change and chills.  HENT:  Negative for congestion, mouth sores and sinus pressure.   Eyes:  Negative for visual disturbance.  Respiratory:  Negative for cough and chest tightness.   Gastrointestinal:  Positive for constipation. Negative for abdominal pain and nausea.  Genitourinary:  Negative for difficulty urinating, frequency and vaginal pain.  Musculoskeletal:  Positive for back pain. Negative for gait problem.  Skin:  Negative for pallor and rash.  Neurological:  Negative for dizziness, tremors, weakness, numbness and headaches.  Psychiatric/Behavioral:  Positive for behavioral problems, confusion and decreased concentration. Negative for agitation, sleep disturbance and suicidal  ideas. The patient is nervous/anxious.     Objective:  BP 122/74   Pulse 77   Temp 98.1 F (36.7 C) (Oral)   Ht 5' (1.524 m)   Wt 86 lb 12.8 oz (39.4 kg)   SpO2 97%   BMI 16.95 kg/m   BP Readings from Last 3 Encounters:  05/23/24 122/74  04/26/24 134/83  02/21/24 116/70    Wt Readings from Last 3 Encounters:  05/23/24 86 lb 12.8 oz (39.4 kg)  04/26/24 89 lb (40.4 kg)  02/21/24 91 lb (41.3 kg)     Physical Exam Constitutional:      General: She is not in acute distress.    Appearance: Normal appearance. She is well-developed.  HENT:     Head: Normocephalic.     Right Ear: External ear normal.     Left Ear: External ear normal.     Nose: Nose normal.  Eyes:     General:        Right eye: No discharge.        Left eye: No discharge.     Conjunctiva/sclera: Conjunctivae normal.     Pupils: Pupils are equal, round, and reactive to light.  Neck:     Thyroid : No thyromegaly.     Vascular: No JVD.     Trachea: No tracheal deviation.  Cardiovascular:     Rate and Rhythm: Normal rate and regular rhythm.     Heart sounds: Normal heart sounds.  Pulmonary:     Effort: No respiratory distress.     Breath sounds: No stridor. No wheezing.  Abdominal:     General: Bowel sounds are normal. There is no distension.     Palpations: Abdomen is soft. There is no mass.     Tenderness: There is no abdominal tenderness. There is no guarding or rebound.  Musculoskeletal:        General: No tenderness.     Cervical back: Normal range of motion and neck supple. No rigidity.  Lymphadenopathy:     Cervical: No cervical adenopathy.  Skin:    Findings: No erythema or rash.  Neurological:     Mental Status: Mental status is at baseline.     Cranial Nerves: No cranial nerve deficit.     Motor: No abnormal muscle tone.     Coordination: Coordination normal.     Deep Tendon Reflexes: Reflexes normal.  Psychiatric:        Thought Content: Thought content normal.   dysphoric  Lab Results  Component Value Date   WBC 6.1 04/26/2024   HGB 12.6 04/26/2024   HCT 38.0 04/26/2024   PLT 301.0 04/26/2024   GLUCOSE 94 04/26/2024   CHOL 279 (H) 12/29/2021   TRIG 174.0 (H) 12/29/2021   HDL 56.50 12/29/2021   LDLDIRECT 125.6 01/16/2013   LDLCALC 188 (H) 12/29/2021   ALT 9 04/26/2024   AST 18 04/26/2024   NA 138 04/26/2024   K 3.9 04/26/2024   CL 101 04/26/2024   CREATININE 0.92 04/26/2024    BUN 19 04/26/2024   CO2 30 04/26/2024   TSH 2.32 04/26/2024   INR 0.92 04/22/2018   HGBA1C 6.1 08/22/2019    DG Chest Port 1 View Result Date: 04/08/2023 CLINICAL DATA:  Provided history: Palpitations. EXAM: PORTABLE CHEST 1 VIEW COMPARISON:  Prior chest radiograph 12/03/2022 and earlier. FINDINGS: A small portion of the left lateral costophrenic angle is excluded from the field of view. Within this limitation, findings are as follows. Heart size within  normal limits. Aortic atherosclerosis. Ill-defined opacity within the lateral left lung base. No appreciable airspace consolidation on the right. No evidence of pleural effusion or pneumothorax. No acute osseous abnormality identified. Chronic, healed fracture deformity of the right sixth rib. IMPRESSION: 1. A small portion of the left lateral costophrenic angle is excluded from the field of view. Within this limitation, findings are as follows. 2. Ill-defined opacity within the lateral left lung base, which may reflect atelectasis or pneumonia. 3. Aortic Atherosclerosis (ICD10-I70.0). Electronically Signed   By: Rockey Childs D.O.   On: 04/08/2023 11:46    Assessment & Plan:   Problem List Items Addressed This Visit     Anxiety disorder - Primary    Cont w/prn Lorazepam  Increase Abilify  to 2 mg bid  Potential benefits of a long term benzodiazepines  use as well as potential risks  and complications were explained to the patient and were aknowledged. She declined home care RN visit.      Arthralgia     Ms Danielsen needs help with house chores due to her medical issues.      Thoracic back pain   Worse Thoracic spine X ray Voltaren  gel Tylenol  prn      Weight loss   Wt Readings from Last 3 Encounters:  05/23/24 86 lb 12.8 oz (39.4 kg)  04/26/24 89 lb (40.4 kg)  02/21/24 91 lb (41.3 kg)         Well adult exam               No orders of the defined types were placed in this encounter.     Follow-up: Return in  about 3 months (around 08/22/2024) for a follow-up visit.  Marolyn Noel, MD

## 2024-05-23 NOTE — Assessment & Plan Note (Addendum)
 Worse Thoracic spine X ray Voltaren  gel Tylenol  prn

## 2024-05-23 NOTE — Assessment & Plan Note (Signed)
   Vickie Wilson needs help with house chores due to her medical issues.

## 2024-05-23 NOTE — Assessment & Plan Note (Signed)
  Cont w/prn Lorazepam  Increase Abilify  to 2 mg bid  Potential benefits of a long term benzodiazepines  use as well as potential risks  and complications were explained to the patient and were aknowledged. She declined home care RN visit.

## 2024-05-24 ENCOUNTER — Telehealth: Payer: Self-pay

## 2024-05-24 NOTE — Telephone Encounter (Signed)
 Copied from CRM [id number]. Topic: Clinical - Medication Question >> May 24, 2024  2:26 PM Aisha D wrote: Reason for CRM: Pt is requesting to speak with Dr.Plotnikov or his nurse in regards to two medications that were sent to her. Pt stated that they were switched from 2 other medications but she is not sure what they are for.

## 2024-05-28 NOTE — Telephone Encounter (Signed)
 I did give her any prescription medications at the last visit.  She was advised to use over-the-counter Tylenol  and over-the-counter Voltaren  gel.  Thanks

## 2024-05-30 ENCOUNTER — Ambulatory Visit: Payer: Self-pay | Admitting: *Deleted

## 2024-05-30 NOTE — Telephone Encounter (Signed)
 Copied from CRM [id number]. Topic: Clinical - Red Word Triage >> May 30, 2024  2:11 PM [place] E wrote: [facility] that prompted transfer to Nurse Triage: Abdominal pain. Patient stated that she is losing weight, having a loss of appetite, and diarrhea. Reason for Disposition  [1] MODERATE diarrhea (e.g., 4-6 times / day more than normal) AND [2] age > 70 years  Answer Assessment - Initial Assessment Questions 1. DIARRHEA SEVERITY: How bad is the diarrhea? How many more stools have you had in the past 24 hours than normal?    Pt very hard of hearing.   I had to speak loudly for her to understand me.     I'm having diarrhea.   I was in the office a few days ago.   I was constipated and now I'm going.  Now it's real watery.   I don't know what to eat or what not to eat or what to do.   I can't stay like this.   I need to know what is wrong with me. Only one time I have been to the bathroom where it was normal.    I want him to find out what is wrong with me.   I don't have a way into the office because my daughter is out of town.    I just need him to tell me what is wrong with me.     2. ONSET: When did the diarrhea begin?      Seen in the office a few days ago for this.  (Per chart seen on 05/23/2024).  Refused an appt.  Can't you just go ask him what is wrong with me?   I can wait.    I let her know Dr. Garald was in seeing pts so we would need to check with him and call her back.   She was agreeable to this.  3. STOOL DESCRIPTION:  How loose or watery is the diarrhea? What is the stool color? Is there any blood or mucous in the stool?     It's watery 4. VOMITING: Are you also vomiting? If Yes, ask: How many times in the past 24 hours?      No 5. ABDOMEN PAIN: Are you having any abdomen pain? If Yes, ask: What does it feel like? (e.g., crampy, dull, intermittent, constant)      Did not mention abd pain 6. ABDOMEN PAIN SEVERITY: If present, ask: How bad is the pain?   (e.g., Scale 1-10; mild, moderate, or severe)     N/A 7. ORAL INTAKE: If vomiting, Have you been able to drink liquids? How much liquids have you had in the past 24 hours?     I'm afraid to eat or drink too much.   I don't know what to eat or what not to eat because of this diarrhea. 8. HYDRATION: Any signs of dehydration? (e.g., dry mouth [not just dry lips], too weak to stand, dizziness, new weight loss) When did you last urinate?     No  9. EXPOSURE: Have you traveled to a foreign country recently? Have you been exposed to anyone with diarrhea? Could you have eaten any food that was spoiled?     Not asked 10. ANTIBIOTIC USE: Are you taking antibiotics now or have you taken antibiotics in the past 2 months?       Not asked since recently seen 11. OTHER SYMPTOMS: Do you have any other symptoms? (e.g., fever, blood in stool)  Losing weight and loss of appetite 12. PREGNANCY: Is there any chance you are pregnant? When was your last menstrual period?       N/A due to age  Protocols used: Diarrhea-A-AH FYI Only or Action Required?: Action required by provider: clinical question for provider.  Patient was last seen in primary care on 05/23/2024 by Plotnikov, Karlynn GAILS, MD.  Called Nurse Triage reporting Diarrhea.  Symptoms began several weeks ago.Having watery diarrhea    She wants to know what is wrong with her.   Losing weight and poor appetite.   Doesn't know what she should and should not eat for the diarrhea.    Interventions attempted: Nothing.  Symptoms are: unchanged.Still having watery diarrhea   Refused an appt because daughter out of town and no way to come in.   Message sent to Dr. Garald.   Pt agreeable to someone calling her back.  Triage Disposition: Call PCP Now  Patient/caregiver understands and will follow disposition?: Yes

## 2024-06-01 ENCOUNTER — Telehealth: Payer: Self-pay | Admitting: Radiology

## 2024-06-01 NOTE — Telephone Encounter (Signed)
 Copied from CRM [id number]. Topic: General - Other >> Jun 01, 2024 12:15 PM Mercedes MATSU wrote: Reason for CRM: Patient is requesting a call back from Dr. Delle nurse and can be reached at [phone].

## 2024-06-02 NOTE — Telephone Encounter (Signed)
 Patient states she is not getting any better with the diarrhea. Sometimes it gets better patient states. Last time she had diarrhea was about 3-4 days ago. Asking what she should do or try.

## 2024-06-06 NOTE — Telephone Encounter (Signed)
 The diarrhea is likely related to the laxatives.  Stop bisacodyl  and MiraLAX  or other laxatives.  Has been taking.  Restart bisacodyl  in a couple days after diarrhea stops.  Thanks

## 2024-06-07 ENCOUNTER — Encounter (HOSPITAL_BASED_OUTPATIENT_CLINIC_OR_DEPARTMENT_OTHER): Payer: Self-pay

## 2024-06-07 ENCOUNTER — Other Ambulatory Visit: Payer: Self-pay

## 2024-06-07 ENCOUNTER — Ambulatory Visit: Payer: Self-pay

## 2024-06-07 ENCOUNTER — Emergency Department (HOSPITAL_BASED_OUTPATIENT_CLINIC_OR_DEPARTMENT_OTHER)
Admission: EM | Admit: 2024-06-07 | Discharge: 2024-06-07 | Disposition: A | Discharge status: Home / Self Care | Source: Ambulatory Visit | Attending: Emergency Medicine | Admitting: Emergency Medicine

## 2024-06-07 ENCOUNTER — Emergency Department (HOSPITAL_BASED_OUTPATIENT_CLINIC_OR_DEPARTMENT_OTHER)

## 2024-06-07 DIAGNOSIS — R1030 Lower abdominal pain, unspecified: Secondary | ICD-10-CM | POA: Diagnosis not present

## 2024-06-07 DIAGNOSIS — R935 Abnormal findings on diagnostic imaging of other abdominal regions, including retroperitoneum: Secondary | ICD-10-CM | POA: Diagnosis not present

## 2024-06-07 DIAGNOSIS — N3000 Acute cystitis without hematuria: Secondary | ICD-10-CM | POA: Insufficient documentation

## 2024-06-07 DIAGNOSIS — R109 Unspecified abdominal pain: Secondary | ICD-10-CM

## 2024-06-07 LAB — CBC WITH DIFFERENTIAL/PLATELET
Abs Immature Granulocytes: 0.01 K/uL (ref 0.00–0.07)
Basophils Absolute: 0 K/uL (ref 0.0–0.1)
Basophils Relative: 0 %
Eosinophils Absolute: 0.1 K/uL (ref 0.0–0.5)
Eosinophils Relative: 1 %
HCT: 36.7 % (ref 36.0–46.0)
Hemoglobin: 12.4 g/dL (ref 12.0–15.0)
Immature Granulocytes: 0 %
Lymphocytes Relative: 48 %
Lymphs Abs: 3.1 K/uL (ref 0.7–4.0)
MCH: 28.5 pg (ref 26.0–34.0)
MCHC: 33.8 g/dL (ref 30.0–36.0)
MCV: 84.4 fL (ref 80.0–100.0)
Monocytes Absolute: 0.7 K/uL (ref 0.1–1.0)
Monocytes Relative: 10 %
Neutro Abs: 2.6 K/uL (ref 1.7–7.7)
Neutrophils Relative %: 41 %
Platelets: 305 K/uL (ref 150–400)
RBC: 4.35 MIL/uL (ref 3.87–5.11)
RDW: 14.4 % (ref 11.5–15.5)
WBC: 6.4 K/uL (ref 4.0–10.5)
nRBC: 0 % (ref 0.0–0.2)

## 2024-06-07 LAB — COMPREHENSIVE METABOLIC PANEL WITH GFR
ALT: 12 U/L (ref 0–44)
AST: 34 U/L (ref 15–41)
Albumin: 4.6 g/dL (ref 3.5–5.0)
Alkaline Phosphatase: 46 U/L (ref 38–126)
Anion gap: 17 — ABNORMAL HIGH (ref 5–15)
BUN: 19 mg/dL (ref 8–23)
CO2: 21 mmol/L — ABNORMAL LOW (ref 22–32)
Calcium: 10 mg/dL (ref 8.9–10.3)
Chloride: 101 mmol/L (ref 98–111)
Creatinine, Ser: 0.91 mg/dL (ref 0.44–1.00)
GFR, Estimated: 60 mL/min — ABNORMAL LOW (ref >60)
Glucose, Bld: 106 mg/dL — ABNORMAL HIGH (ref 70–99)
Potassium: 3.7 mmol/L (ref 3.5–5.1)
Sodium: 139 mmol/L (ref 135–145)
Total Bilirubin: 0.5 mg/dL (ref 0.0–1.2)
Total Protein: 7.1 g/dL (ref 6.5–8.1)

## 2024-06-07 LAB — URINALYSIS, ROUTINE W REFLEX MICROSCOPIC
Bilirubin Urine: NEGATIVE
Glucose, UA: NEGATIVE mg/dL
Hgb urine dipstick: NEGATIVE
Ketones, ur: 15 mg/dL — AB
Nitrite: POSITIVE — AB
Specific Gravity, Urine: 1.025 (ref 1.005–1.030)
WBC, UA: >50 WBC/hpf (ref 0–5)
pH: 5.5 (ref 5.0–8.0)

## 2024-06-07 LAB — LIPASE, BLOOD: Lipase: 49 U/L (ref 11–51)

## 2024-06-07 MED ORDER — CEPHALEXIN 500 MG PO CAPS: 500.0000 mg | ORAL_CAPSULE | Freq: Two times a day (BID) | ORAL | 0 refills | Status: AC

## 2024-06-07 MED ORDER — IOHEXOL 300 MG/ML  SOLN
60.0000 mL | Freq: Once | INTRAMUSCULAR | Status: AC | PRN
  Administered 2024-06-07: 60 mL via INTRAVENOUS

## 2024-06-07 MED ORDER — CEPHALEXIN 250 MG PO CAPS
500.0000 mg | ORAL_CAPSULE | Freq: Once | ORAL | Status: AC
  Administered 2024-06-07: 500 mg via ORAL
  Filled 2024-06-07: qty 2

## 2024-06-07 NOTE — ED Triage Notes (Addendum)
 Pt states she has had abdominal for several months on/off No pain at present C/o watery stools  Remembers only one formed BM in the past few months Denies N/V Pt states she has lost weight r/t in fear of having an accident

## 2024-06-07 NOTE — Telephone Encounter (Signed)
 FYI Only or Action Required?: Action required by provider: request for appointment.  Patient was last seen in primary care on 05/23/2024 by Plotnikov, Vickie GAILS, MD.  Called Nurse Triage reporting Diarrhea.  Symptoms began several weeks ago.  Interventions attempted: Rest, hydration, or home remedies.  Symptoms are: gradually worsening.  Triage Disposition: See HCP Within 4 Hours (Or PCP Triage)  Patient/caregiver understands and will follow disposition?: No, wishes to speak with PCPCopied from CRM #[id number]. Topic: Clinical - Red Word Triage >> Jun 07, 2024  9:31 AM Mia F wrote: Red Word that prompted transfer to Nurse Triage: uncontrollable diarrhea. Has some stomach pains sometimes. Losing a lot of weight. Seen pcp about it a few weeks ago and no relief since. Going on about 2 or 3 months ago. Only had sone small solid bm since this has started. Also experiencing shakes. Reason for Disposition  [1] SEVERE diarrhea (e.g., 7 or more times / day more than normal) AND [2] age > 60 years  Answer Assessment - Initial Assessment Questions Pt called last week and was triaged. Message sent to office. Pt stated I haven't heard back from them. I'm not having as much diarrhea because I'm not eating very much at all. I wonder if I have a gut infection. I've lost so much weight. Pt only wants to see PCP. RN advised to be sooner. CAL notified-spoke with Erin      1. DIARRHEA SEVERITY: How bad is the diarrhea? How many more stools have you had in the past 24 hours than normal?      2-3 weeks ago  2. ONSET: When did the diarrhea begin?      Weeks ago  3. STOOL DESCRIPTION:  How loose or watery is the diarrhea? What is the stool color? Is there any blood or mucous in the stool?     Watery  4. VOMITING: Are you also vomiting? If Yes, ask: How many times in the past 24 hours?      denies 5. ABDOMEN PAIN: Are you having any abdomen pain? If Yes, ask: What does it feel like? (e.g.,  crampy, dull, intermittent, constant)      crampy 6. ABDOMEN PAIN SEVERITY: If present, ask: How bad is the pain?  (e.g., Scale 1-10; mild, moderate, or severe)     Mild  7. ORAL INTAKE: If vomiting, Have you been able to drink liquids? How much liquids have you had in the past 24 hours?     2 water bottles 8. HYDRATION: Any signs of dehydration? (e.g., dry mouth [not just dry lips], too weak to stand, dizziness, new weight loss) When did you last urinate?     Weight loss, dry mouth 9. EXPOSURE: Have you traveled to a foreign country recently? Have you been exposed to anyone with diarrhea? Could you have eaten any food that was spoiled?     no 10. ANTIBIOTIC USE: Are you taking antibiotics now or have you taken antibiotics in the past 2 months?       no 11. OTHER SYMPTOMS: Do you have any other symptoms? (e.g., fever, blood in stool)       Losing weight  Protocols used: Diarrhea-A-AH

## 2024-06-07 NOTE — ED Notes (Signed)
 Pt unable to give specimens at present time, but is aware that we need urine and stool specimens

## 2024-06-07 NOTE — Telephone Encounter (Signed)
 FYI Only or Action Required?: FYI only for provider.  Patient was last seen in primary care on 05/23/2024 by Plotnikov, Karlynn GAILS, MD.  Called Nurse Triage reporting Advice Only.  Symptoms began today.  Interventions attempted: Other: n/a.  Symptoms are: n/a.  Triage Disposition: Information or Advice Only Call  Patient/caregiver understands and will follow disposition?: Yes  Copied from CRM #[id number]. Topic: Clinical - Red Word Triage >> Jun 07, 2024  9:31 AM Mia F wrote: Red Word that prompted transfer to Nurse Triage: uncontrollable diarrhea. Has some stomach pains sometimes. Losing a lot of weight. Seen pcp about it a few weeks ago and no relief since. Going on about 2 or 3 months ago. Only had sone small solid bm since this has started. Also experiencing shakes. >> Jun 07, 2024  2:36 PM [place] J wrote: Patient is calling to state that the triage nurse she spoke to mentioned giving the patient a call back to see what we can do about getting her seen by a provider in regards to her stomach pains and diarrhea. She would like to see if there is an update. Reason for Disposition  [1] Follow-up call to recent contact AND [2] information only call, no triage required  Answer Assessment - Initial Assessment Questions 1. REASON FOR CALL: What is the main reason for your call? or How can I best help you?     Patient's daughter, Elvie, called to clarify earlier disposition     Read disposition form earlier call to patient's daughter who states that her daughter (patient's grand daughter) will take patient to ED  Protocols used: Information Only Call - No Triage-A-AH

## 2024-06-07 NOTE — Telephone Encounter (Signed)
 Patient called back from earlier call----she states she was waiting on a call from her PCP office  Patient states she is seeing blackish stool now Patient states that she is also having black stools This RN called the CAL and they also advised for the patient to go to the ER The Triage RN advised the patient that the ER is recommended Patient states that she cannot go to the Emergency Room at this time during triage She is advised that if she does not go to the Emergency Room at this time to call 911 if she gets worse Patient states she will wait for a call back from her PCP's advice and she will also call back if needed.  FYI Only or Action Required?: Action required by provider: clinical question for provider and update on patient condition.  Patient was last seen in primary care on 05/23/2024 by Plotnikov, Karlynn GAILS, MD.  Called Nurse Triage reporting Diarrhea.  Patient states no changes other than the blackish stools --- states small amount but did confirm black tarry like stool  Triage Disposition: Go to ED Now (Notify PCP)  Patient/caregiver understands and will follow disposition?: No, wishes to speak with PCP           Copied from CRM #[id number]. Topic: Clinical - Red Word Triage >> Jun 07, 2024  2:36 PM [place] J wrote: Patient is calling to state that the triage nurse she spoke to mentioned giving the patient a call back to see what we can do about getting her seen by a provider in regards to her stomach pains and diarrhea. She would like to see if there is an update. Reason for Disposition  Black or tarry bowel movements  (Exception: Chronic-unchanged black-grey BMs AND is taking iron pills or Pepto-Bismol.)  Answer Assessment - Initial Assessment Questions Patient called back from earlier call----she states she was waiting on a call from her PCP office  Patient states she is seeing blackish stool now Patient states that she is also having black stools This RN called  the [facility] and they also advised for the patient to go to the ER The Triage RN advised the patient that the ER is recommended Patient states that she cannot go to the Emergency Room at this time during triage She is advised that if she does not go to the Emergency Room at this time to call 911 if she gets worse Patient states she will wait for a call back from her PCP's advice and she will also call back if needed.  Protocols used: [facility]

## 2024-06-07 NOTE — Discharge Instructions (Signed)
 Please return if you develop fever or worsening symptoms.  Follow-up with your primary care doctor

## 2024-06-07 NOTE — ED Provider Notes (Signed)
 [place] EMERGENCY DEPARTMENT AT [facility] Provider Note   CSN: [id number] Arrival date & time: 06/07/24  1700     Patient presents with: Abdominal Pain   Vickie Wilson is a 89 y.o. female.   Is here with some lower abdominal pain.  She is having some diarrhea on and off at times the last few months.  She denies any weakness numbness tingling.  Denies any fever or chills.  Maybe some urinary symptoms.  Recent urinary tract infection.  She has been dealing with constipation at times and diarrhea at times for several months.  Has not follow-up with her primary care doctor.  Has had some weight loss.  Has a history of anxiety depression constipation diabetes.  The history is provided by the patient.       Prior to Admission medications   Medication Sig Start Date End Date Taking? Authorizing Provider  cephALEXin  (KEFLEX ) 500 MG capsule Take 1 capsule (500 mg total) by mouth 2 (two) times daily for 5 days. 06/07/24 06/12/24 Yes Kavian Peters, DO  acetaminophen  (TYLENOL  8 HOUR) 650 MG CR tablet Take 1 tablet (650 mg total) by mouth every 8 (eight) hours as needed for pain. 05/18/24   Plotnikov, Karlynn GAILS, MD  amLODipine  (NORVASC ) 5 MG tablet Take 1 tablet (5 mg total) by mouth daily. 02/21/24   Plotnikov, Karlynn GAILS, MD  ARIPiprazole  (ABILIFY ) 2 MG tablet Take 1 tablet (2 mg total) by mouth 2 (two) times daily. 02/21/24   Plotnikov, Aleksei V, MD  bisacodyl  5 MG EC tablet Take 1 tablet (5 mg total) by mouth 2 (two) times daily as needed for moderate constipation. 05/16/24   Plotnikov, Aleksei V, MD  diclofenac  Sodium (VOLTAREN  ARTHRITIS PAIN) 1 % GEL Apply 2 g topically 4 (four) times daily. Patient not taking: Reported on 05/23/2024 05/18/24   Plotnikov, Aleksei V, MD  levETIRAcetam  (KEPPRA ) 750 MG tablet Take 1 tablet (750 mg total) by mouth 2 (two) times daily. 01/05/24   Whitfield Raisin, NP  LORazepam  (ATIVAN ) 0.5 MG tablet Take 1 tablet (0.5 mg total) by mouth 2 (two) times daily as  needed for anxiety. Patient not taking: Reported on 05/23/2024 02/21/24   Plotnikov, Aleksei V, MD  metoprolol  succinate (TOPROL -XL) 25 MG 24 hr tablet Take 1 tablet (25 mg total) by mouth daily. 02/21/24   Plotnikov, Aleksei V, MD  Multiple Vitamins-Minerals (CENTRUM SILVER ) CHEW Chew 1 each by mouth 3 (three) times a week. Patient not taking: Reported on 05/23/2024    [provider]  nitrofurantoin , macrocrystal-monohydrate, (MACROBID ) 100 MG capsule Take 1 capsule (100 mg total) by mouth 2 (two) times daily. Patient not taking: Reported on 05/23/2024 04/30/24   Plotnikov, Aleksei V, MD  polyethylene glycol powder (GLYCOLAX /MIRALAX ) 17 GM/SCOOP powder Dissolve 1 capful (17g) in 4-8 ounces of liquid and take by mouth 1-2 times daily until you have a BM. May use daily or every other day to continue regular bowel movements. Be sure to hydrate 05/16/24   Plotnikov, Aleksei V, MD    Allergies: Patient has no known allergies.    Review of Systems  Updated Vital Signs BP (!) 200/104   Pulse 69   Temp 98.6 F (37 C)   Resp 18   Ht 5' (1.524 m)   Wt 39 kg   SpO2 100%   BMI 16.79 kg/m   Physical Exam Vitals and nursing note reviewed.  Constitutional:      General: She is not in acute distress.  Appearance: She is well-developed. She is not ill-appearing.  HENT:     Head: Normocephalic and atraumatic.     Mouth/Throat:     Mouth: Mucous membranes are moist.  Eyes:     Extraocular Movements: Extraocular movements intact.     Conjunctiva/sclera: Conjunctivae normal.     Pupils: Pupils are equal, round, and reactive to light.  Cardiovascular:     Rate and Rhythm: Normal rate and regular rhythm.     Heart sounds: Normal heart sounds. No murmur heard. Pulmonary:     Effort: Pulmonary effort is normal. No respiratory distress.     Breath sounds: Normal breath sounds.  Abdominal:     Palpations: Abdomen is soft.     Tenderness: There is generalized abdominal tenderness.   Musculoskeletal:        General: No swelling.     Cervical back: Neck supple.  Skin:    General: Skin is warm and dry.     Capillary Refill: Capillary refill takes less than 2 seconds.  Neurological:     General: No focal deficit present.     Mental Status: She is alert.  Psychiatric:        Mood and Affect: Mood normal.     (all labs ordered are listed, but only abnormal results are displayed) Labs Reviewed  COMPREHENSIVE METABOLIC PANEL WITH GFR - Abnormal; Notable for the following components:      Result Value   CO2 21 (*)    Glucose, Bld 106 (*)    GFR, Estimated 60 (*)    Anion gap 17 (*)    All other components within normal limits  URINALYSIS, ROUTINE W REFLEX MICROSCOPIC - Abnormal; Notable for the following components:   APPearance HAZY (*)    Ketones, ur 15 (*)    Protein, ur TRACE (*)    Nitrite POSITIVE (*)    Leukocytes,Ua LARGE (*)    Bacteria, UA FEW (*)    All other components within normal limits  URINE CULTURE  CBC WITH DIFFERENTIAL/PLATELET  LIPASE, BLOOD    EKG: None  Radiology: CT ABDOMEN PELVIS W CONTRAST Result Date: 06/07/2024 EXAM: CT ABDOMEN AND PELVIS WITH CONTRAST TECHNIQUE: Multidetector CT imaging of the abdomen and pelvis was performed using the standard protocol following bolus administration of intravenous contrast. RADIATION DOSE REDUCTION: This exam was performed according to the departmental dose-optimization program which includes automated exposure control, adjustment of the mA and/or kV according to patient size and/or use of iterative reconstruction technique. CONTRAST:  60mL OMNIPAQUE  IOHEXOL  300 MG/ML  SOLN COMPARISON:  CT abdomen and pelvis 05/21/2006 FINDINGS: Lower chest: No acute abnormality. Hepatobiliary: No focal liver abnormality is seen. No gallstones, gallbladder wall thickening, or biliary dilatation. Pancreas: Unremarkable. No pancreatic ductal dilatation or surrounding inflammatory changes. Spleen: Normal in size  without focal abnormality. Adrenals/Urinary Tract: There are scattered subcentimeter hypodensities in both kidneys which are too small to characterize, likely cysts. Otherwise, the kidneys, adrenal glands and bladder are within normal limits. Stomach/Bowel: Stomach is within normal limits. Appendix appears normal. No evidence of bowel wall thickening, distention, or inflammatory changes. Vascular/Lymphatic: Aortic atherosclerosis. No enlarged abdominal or pelvic lymph nodes. Reproductive: There is a calcified posterior uterine fibroid measuring 13 mm. Adnexa are within normal limits. Pessary is present. Other: No abdominal wall hernia or abnormality. No abdominopelvic ascites. Musculoskeletal: There severe degenerative changes at L1-L2. IMPRESSION: 1. No acute localizing process in the abdomen or pelvis. 2. Uterine fibroid. 3. Aortic atherosclerosis. Aortic Atherosclerosis (ICD10-I70.0). Electronically Signed  By: Greig Pique M.D.   On: 06/07/2024 19:35     Procedures   Medications Ordered in the ED  cephALEXin  (KEFLEX ) capsule 500 mg (has no administration in time range)  iohexol  (OMNIPAQUE ) 300 MG/ML solution 60 mL (60 mLs Intravenous Contrast Given 06/07/24 1850)                                    Medical Decision Making Amount and/or Complexity of Data Reviewed Labs: ordered. Radiology: ordered.  Risk Prescription drug management.   Vickie Wilson is here with abdominal pain diarrhea constipation maybe some urinary symptoms.  Unremarkable vitals.  Differential diagnosis constipation/colitis/obstruction/UTI.  Will get basic labs CT scan abdomen and pelvis.  Hard to tell if she is having active diarrhea or constipation.  She states that has been on and off for several months.  If she has a diarrhea episode will try to send off sample.  Urinalysis looks consistent with infection but lab work otherwise unremarkable.  No significant leukocytosis anemia or electrolyte abnormality.  She has  no fever.  Have no concern for sepsis.  CT scan showed no acute findings otherwise.  I do suspect maybe symptoms secondary to UTI.  Will start her on Keflex .  She was unable to provide a stool sample.  Will have her follow-up with her primary care doctor regarding her chronic diarrhea/constipation issues she has had intermittently for the last several months.  She understands return precautions.  Discharged in good condition.  This chart was dictated using voice recognition software.  Despite best efforts to proofread,  errors can occur which can change the documentation meaning.      Final diagnoses:  Abdominal pain, unspecified abdominal location  Acute cystitis without hematuria    ED Discharge Orders          Ordered    cephALEXin  (KEFLEX ) 500 MG capsule  2 times daily        06/07/24 2001               Ruthe Cornet, DO 06/07/24 2009

## 2024-06-07 NOTE — Telephone Encounter (Signed)
 Called [facility] to inform them of patient refusing the ER at this time

## 2024-06-09 LAB — URINE CULTURE: Culture: >=100000 — AB

## 2024-06-10 ENCOUNTER — Telehealth (HOSPITAL_BASED_OUTPATIENT_CLINIC_OR_DEPARTMENT_OTHER): Payer: Self-pay

## 2024-06-10 NOTE — Telephone Encounter (Signed)
 Post ED Visit - Positive Culture Follow-up  Culture report reviewed by antimicrobial stewardship pharmacist: [facility] [x]  Dorn Buttner, Pharm.D. []  Venetia Gully, Pharm.D., BCPS AQ-ID []  Garrel Crews, Pharm.D., BCPS []  Almarie Lunger, Pharm.D., BCPS []  [place], [place].D., BCPS, AAHIVP []  Rosaline Bihari, Pharm.D., BCPS, AAHIVP []  Vernell Meier, PharmD, BCPS []  Latanya Hint, PharmD, BCPS []  Donald Medley, PharmD, BCPS []  Rocky Bold, PharmD []  Dorothyann Alert, PharmD, BCPS []  Morene Babe, PharmD  [facility] []  Rosaline Edison, PharmD []  Romona Bliss, PharmD []  Dolphus Roller, PharmD []  Veva Seip, Rph []  Vernell Daunt) Leonce, PharmD []  Eva Allis, PharmD []  Rosaline Millet, PharmD []  Iantha Batch, PharmD []  Arvin Gauss, PharmD []  Wanda Hasting, PharmD []  Ronal Rav, PharmD []  Rocky Slade, PharmD []  Bard Jeans, PharmD   Positive urine culture Treated with Cephalexin , organism sensitive to the same and no further patient follow-up is required at this time.  [name] 06/10/2024, 3:41 PM

## 2024-06-15 ENCOUNTER — Encounter: Payer: Self-pay | Admitting: Internal Medicine

## 2024-06-15 ENCOUNTER — Ambulatory Visit: Admitting: Internal Medicine

## 2024-06-15 VITALS — BP 118/78 | HR 77 | Temp 98.1°F | Ht 60.0 in | Wt 84.2 lb

## 2024-06-15 DIAGNOSIS — I10 Essential (primary) hypertension: Secondary | ICD-10-CM | POA: Diagnosis not present

## 2024-06-15 DIAGNOSIS — E785 Hyperlipidemia, unspecified: Secondary | ICD-10-CM | POA: Diagnosis not present

## 2024-06-15 DIAGNOSIS — F3342 Major depressive disorder, recurrent, in full remission: Secondary | ICD-10-CM

## 2024-06-15 DIAGNOSIS — Z23 Encounter for immunization: Secondary | ICD-10-CM

## 2024-06-15 DIAGNOSIS — R5383 Other fatigue: Secondary | ICD-10-CM

## 2024-06-15 MED ORDER — SENNA-DOCUSATE SODIUM 8.6-50 MG PO TABS: 1.0000 | ORAL_TABLET | Freq: Every day | ORAL | 3 refills | Status: AC

## 2024-06-15 NOTE — Assessment & Plan Note (Signed)
Noncompliance due to behavioral changes Start on Toprol XL

## 2024-06-15 NOTE — Assessment & Plan Note (Signed)
 Worse.  Vickie Wilson is complaining of being depressed, anxious and lonely.   Cont w/prn Lorazepam  Abilify  2 mg bid  Potential benefits of a long term benzodiazepines, Abilify  use as well as potential risks  and complications were explained to the patient and were aknowledged. She declined home care RN visit.

## 2024-06-15 NOTE — Assessment & Plan Note (Signed)
Continue on Crestor 1-3/week  ?

## 2024-06-15 NOTE — Progress Notes (Signed)
 Subjective:  Patient ID: Vickie Wilson, female    DOB: [birth year]  Age: 89 y.o. MRN: [id number]  CC: No chief complaint on file.   HPI Vickie Wilson presents for constipation, weakness, UTI f/u - post-ER visit. Here w/dtr Elvie   Outpatient Medications Prior to Visit  Medication Sig Dispense Refill   acetaminophen  (TYLENOL  8 HOUR) 650 MG CR tablet Take 1 tablet (650 mg total) by mouth every 8 (eight) hours as needed for pain.     amLODipine  (NORVASC ) 5 MG tablet Take 1 tablet (5 mg total) by mouth daily. 90 tablet 2   ARIPiprazole  (ABILIFY ) 2 MG tablet Take 1 tablet (2 mg total) by mouth 2 (two) times daily. 180 tablet 1   bisacodyl  5 MG EC tablet Take 1 tablet (5 mg total) by mouth 2 (two) times daily as needed for moderate constipation. 30 tablet 0   levETIRAcetam  (KEPPRA ) 750 MG tablet Take 1 tablet (750 mg total) by mouth 2 (two) times daily. 180 tablet 3   metoprolol  succinate (TOPROL -XL) 25 MG 24 hr tablet Take 1 tablet (25 mg total) by mouth daily. 90 tablet 2   Multiple Vitamins-Minerals (CENTRUM SILVER ) CHEW Chew 1 each by mouth 3 (three) times a week.     polyethylene glycol powder (GLYCOLAX /MIRALAX ) 17 GM/SCOOP powder Dissolve 1 capful (17g) in 4-8 ounces of liquid and take by mouth 1-2 times daily until you have a BM. May use daily or every other day to continue regular bowel movements. Be sure to hydrate 850 g 1   diclofenac  Sodium (VOLTAREN  ARTHRITIS PAIN) 1 % GEL Apply 2 g topically 4 (four) times daily. (Patient not taking: Reported on 06/15/2024) 350 g 0   LORazepam  (ATIVAN ) 0.5 MG tablet Take 1 tablet (0.5 mg total) by mouth 2 (two) times daily as needed for anxiety. (Patient not taking: Reported on 06/15/2024) 60 tablet 1   nitrofurantoin , macrocrystal-monohydrate, (MACROBID ) 100 MG capsule Take 1 capsule (100 mg total) by mouth 2 (two) times daily. (Patient not taking: Reported on 05/23/2024) 14 capsule 0   No facility-administered medications prior to visit.     ROS: Review of Systems  Constitutional:  Positive for fatigue. Negative for activity change, appetite change, chills and unexpected weight change.  HENT:  Negative for congestion, mouth sores and sinus pressure.   Eyes:  Negative for visual disturbance.  Respiratory:  Negative for cough and chest tightness.   Cardiovascular:  Negative for leg swelling.  Gastrointestinal:  Negative for abdominal pain and nausea.  Genitourinary:  Positive for frequency. Negative for difficulty urinating and vaginal pain.  Musculoskeletal:  Positive for arthralgias, back pain and gait problem. Negative for neck stiffness.  Skin:  Negative for pallor and rash.  Neurological:  Negative for dizziness, tremors, weakness, numbness and headaches.  Psychiatric/Behavioral:  Positive for confusion, decreased concentration and dysphoric mood. Negative for sleep disturbance and suicidal ideas. The patient is nervous/anxious.     Objective:  BP 118/78 (BP Location: Left Arm, Patient Position: Sitting, Cuff Size: Normal)   Pulse 77   Temp 98.1 F (36.7 C) (Oral)   Ht 5' (1.524 m)   Wt 84 lb 3.2 oz (38.2 kg)   BMI 16.44 kg/m   BP Readings from Last 3 Encounters:  06/15/24 118/78  06/07/24 (!) 196/101  05/23/24 122/74    Wt Readings from Last 3 Encounters:  06/15/24 84 lb 3.2 oz (38.2 kg)  06/07/24 85 lb 15.7 oz (39 kg)  05/23/24 86 lb 12.8 oz (39.4  kg)    Physical Exam Constitutional:      General: She is not in acute distress.    Appearance: Normal appearance. She is well-developed.  HENT:     Head: Normocephalic.     Right Ear: External ear normal.     Left Ear: External ear normal.     Nose: Nose normal.  Eyes:     General:        Right eye: No discharge.        Left eye: No discharge.     Conjunctiva/sclera: Conjunctivae normal.     Pupils: Pupils are equal, round, and reactive to light.  Neck:     Thyroid : No thyromegaly.     Vascular: No JVD.     Trachea: No tracheal deviation.   Cardiovascular:     Rate and Rhythm: Normal rate and regular rhythm.     Heart sounds: Normal heart sounds.  Pulmonary:     Effort: No respiratory distress.     Breath sounds: No stridor. No wheezing.  Abdominal:     General: Bowel sounds are normal. There is no distension.     Palpations: Abdomen is soft. There is no mass.     Tenderness: There is no abdominal tenderness. There is no guarding or rebound.  Musculoskeletal:        General: No tenderness or deformity.     Cervical back: Normal range of motion and neck supple. No rigidity.     Right lower leg: No edema.     Left lower leg: No edema.  Lymphadenopathy:     Cervical: No cervical adenopathy.  Skin:    Findings: No erythema or rash.  Neurological:     Mental Status: Mental status is at baseline.     Cranial Nerves: No cranial nerve deficit.     Motor: No abnormal muscle tone.     Coordination: Coordination abnormal.     Gait: Gait abnormal.     Deep Tendon Reflexes: Reflexes normal.  Psychiatric:        Behavior: Behavior normal.        Thought Content: Thought content normal.   Ataxic slightly  Lab Results  Component Value Date   WBC 6.4 06/07/2024   HGB 12.4 06/07/2024   HCT 36.7 06/07/2024   PLT 305 06/07/2024   GLUCOSE 106 (H) 06/07/2024   CHOL 279 (H) 12/29/2021   TRIG 174.0 (H) 12/29/2021   HDL 56.50 12/29/2021   LDLDIRECT 125.6 01/16/2013   LDLCALC 188 (H) 12/29/2021   ALT 12 06/07/2024   AST 34 06/07/2024   NA 139 06/07/2024   K 3.7 06/07/2024   CL 101 06/07/2024   CREATININE 0.91 06/07/2024   BUN 19 06/07/2024   CO2 21 (L) 06/07/2024   TSH 2.32 04/26/2024   INR 0.92 04/22/2018   HGBA1C 6.1 08/22/2019    CT ABDOMEN PELVIS W CONTRAST Result Date: 06/07/2024 EXAM: CT ABDOMEN AND PELVIS WITH CONTRAST TECHNIQUE: Multidetector CT imaging of the abdomen and pelvis was performed using the standard protocol following bolus administration of intravenous contrast. RADIATION DOSE REDUCTION: This exam  was performed according to the departmental dose-optimization program which includes automated exposure control, adjustment of the mA and/or kV according to patient size and/or use of iterative reconstruction technique. CONTRAST:  60mL OMNIPAQUE  IOHEXOL  300 MG/ML  SOLN COMPARISON:  CT abdomen and pelvis 05/21/2006 FINDINGS: Lower chest: No acute abnormality. Hepatobiliary: No focal liver abnormality is seen. No gallstones, gallbladder wall thickening, or biliary dilatation. Pancreas:  Unremarkable. No pancreatic ductal dilatation or surrounding inflammatory changes. Spleen: Normal in size without focal abnormality. Adrenals/Urinary Tract: There are scattered subcentimeter hypodensities in both kidneys which are too small to characterize, likely cysts. Otherwise, the kidneys, adrenal glands and bladder are within normal limits. Stomach/Bowel: Stomach is within normal limits. Appendix appears normal. No evidence of bowel wall thickening, distention, or inflammatory changes. Vascular/Lymphatic: Aortic atherosclerosis. No enlarged abdominal or pelvic lymph nodes. Reproductive: There is a calcified posterior uterine fibroid measuring 13 mm. Adnexa are within normal limits. Pessary is present. Other: No abdominal wall hernia or abnormality. No abdominopelvic ascites. Musculoskeletal: There severe degenerative changes at L1-L2. IMPRESSION: 1. No acute localizing process in the abdomen or pelvis. 2. Uterine fibroid. 3. Aortic atherosclerosis. Aortic Atherosclerosis (ICD10-I70.0). Electronically Signed   By: Greig Pique M.D.   On: 06/07/2024 19:35    Assessment & Plan:   Problem List Items Addressed This Visit     Depression   Worse.  Vickie Wilson is complaining of being depressed, anxious and lonely.   Cont w/prn Lorazepam  Abilify  2 mg bid  Potential benefits of a long term benzodiazepines, Abilify  use as well as potential risks  and complications were explained to the patient and were aknowledged. She declined home  care RN visit.      Dyslipidemia   Continue on Crestor  1-3/week       Fatigue   Worse Ms Devos needs help with house chores due to her medical issues.      HTN (hypertension) - Primary   Noncompliance due to behavioral changes Start on Toprol  XL      Other Visit Diagnoses       Flu vaccine need       Relevant Orders   Flu vaccine HIGH DOSE PF(Fluzone Trivalent) (Completed)         Meds ordered this encounter  Medications   sennosides-docusate sodium  (SENOKOT-S) 8.6-50 MG tablet    Sig: Take 1 tablet by mouth daily.    Dispense:  100 tablet    Refill:  3      Follow-up: Return in about 3 months (around 09/15/2024) for a follow-up visit.  Marolyn Noel, MD

## 2024-06-15 NOTE — Assessment & Plan Note (Signed)
 Vit D

## 2024-06-15 NOTE — Assessment & Plan Note (Signed)
[name] needs help with house chores due to her medical issues.

## 2024-07-20 ENCOUNTER — Ambulatory Visit: Payer: Self-pay

## 2024-07-20 ENCOUNTER — Telehealth: Payer: Self-pay

## 2024-07-20 NOTE — Telephone Encounter (Signed)
 FYI Only or Action Required?: FYI only for provider: appointment scheduled on 07/24/2024.  Patient was last seen in primary care on 06/15/2024 by Plotnikov, Karlynn GAILS, MD.  Called Nurse Triage reporting Diarrhea.  Symptoms began a week ago.  Interventions attempted: Rest, hydration, or home remedies.  Symptoms are: unchanged.  Triage Disposition: See Within 3 Days in Office (overriding See Physician Within 24 Hours)  Patient/caregiver understands and will follow disposition?: Yes  Reason for Triage: Patient's daughter, Elvie, called to report that patient has uncontrolled diarrhea. She cannot keep anything in. Immediately has diarrhea, even after meals. C/b: [phone]   Patient is no longer taking miralax .    Reason for Disposition  [1] MODERATE diarrhea (e.g., 4-6 times / day more than normal) AND [2] age > 70 years  Answer Assessment - Initial Assessment Questions Daughter called to report patient has been having uncontrolled diarrhea. Having diarrhea after meals. Going on for a week. Daughter is to visit patient this afternoon. Did request to go ahead and schedule patient with PCP. Discussed options with other providers. Daughter states patient would like to see only her PCP. Scheduled for soonest appointment-07/24/2024 at 1:40 PM per their request. Was asked to call patient to get any information. Call placed to patient no answer. Voicemail left for patient to call back.   1. DIARRHEA SEVERITY: How bad is the diarrhea? How many more stools have you had in the past 24 hours than normal?     Having multiple rounds of diarrhea 2. ONSET: When did the diarrhea begin?     Going on for a week 3. STOOL DESCRIPTION:  How loose or watery is the diarrhea? What is the stool color? Is there any blood or mucous in the stool?     Daughter is unsure 4. VOMITING: Are you also vomiting? If Yes, ask: How many times in the past 24 hours?      no 5. ABDOMEN PAIN: Are you having  any abdomen pain? If Yes, ask: What does it feel like? (e.g., crampy, dull, intermittent, constant)      Daughter unsure 6. ABDOMEN PAIN SEVERITY: If present, ask: How bad is the pain?  (e.g., Scale 1-10; mild, moderate, or severe)     Daughter unsure 7. ORAL INTAKE: If vomiting, Have you been able to drink liquids? How much liquids have you had in the past 24 hours?     no 8. HYDRATION: Any signs of dehydration? (e.g., dry mouth [not just dry lips], too weak to stand, dizziness, new weight loss) When did you last urinate?     Daughter states she doesn't think so 9. EXPOSURE: Have you traveled to a foreign country recently? Have you been exposed to anyone with diarrhea? Could you have eaten any food that was spoiled?     no 10. ANTIBIOTIC USE: Are you taking antibiotics now or have you taken antibiotics in the past 2 months?       no 11. OTHER SYMPTOMS: Do you have any other symptoms? (e.g., fever, blood in stool)       no  Protocols used: Diarrhea-A-AH

## 2024-07-20 NOTE — Telephone Encounter (Signed)
 Copied from CRM [id number]. Topic: Clinical - Medication Question >> Jul 20, 2024  4:06 PM Burnard DEL wrote: Reason for CRM: Patient called in stating that Dr Garald was supposed to send in a medication for her for diarrhea.Patient is scheduled to see him on Monday 07/24/2024. Patient called pharmacy and they do not have any medications for her. There is no documentation that he is going to send any medication in for her.  [facility] [place], [place] - [address] [facility] [address]  Phone: [phone] Fax: [phone]

## 2024-07-20 NOTE — Telephone Encounter (Signed)
 FYI Only or Action Required?: Action required by provider: clinical question for provider and update on patient condition.  Patient was last seen in primary care on 06/15/2024 by Plotnikov, Karlynn GAILS, MD.  Called Nurse Triage reporting Diarrhea and New Med Request.  Symptoms began a week ago.  Interventions attempted: OTC medications: antidiarrhea  and Rest, hydration, or home remedies.  Symptoms are: gradually worsening.  Triage Disposition: See HCP Within 4 Hours (Or PCP Triage)  Patient/caregiver understands and will follow disposition?: No, wishes to speak with PCP   Copied from CRM [id number]. Topic: Clinical - Pink Word Triage >> Jul 20, 2024  8:37 AM Lonell PEDLAR wrote: Dawne Word triggered transfer to Nurse Triage. See Triage Message for details. >> Jul 20, 2024 12:27 PM Alexandria E wrote: Patient is scheduled for 11/10, but she does not think she can wait that long to get seen. Please call patient back. >> Jul 20, 2024  8:39 AM Lonell PEDLAR wrote: Reason for Triage: Patient's daughter, Elvie, called to report that patient has uncontrolled diarrhea. She cannot keep anything in. Immediately has diarrhea, even after meals. C/b: [phone]  Patient is no longer taking miralax . Reason for Disposition  [1] SEVERE diarrhea (e.g., 7 or more times / day more than normal) AND [2] age > 60 years  Answer Assessment - Initial Assessment Questions Additional info:  Triaged earlier today, partial triage completed this call. Patient returning call after message that she is scheduled with pcp on 07/24/24, patient states she would like to see someone sooner since she has had 7 bouts of diarrhea already today, and doesn't think she can make it that long. Offered visit today with alternate provider but she declined oh I can't do that I will have a mess in my pants inquired how patient would like to proceed and she stated she would like medication sent to pharmacy to stop diarrhea. Please advise.    1.  DIARRHEA SEVERITY: How bad is the diarrhea? How many more stools have you had in the past 24 hours than normal?      7 times today  2. ONSET: When did the diarrhea begin?      One week ago  3. STOOL DESCRIPTION:  How loose or watery is the diarrhea? What is the stool color? Is there any blood or mucous in the stool?     liquid 4. VOMITING: Are you also vomiting? If Yes, ask: How many times in the past 24 hours?      no 5. ABDOMEN PAIN: Are you having any abdomen pain? If Yes, ask: What does it feel like? (e.g., crampy, dull, intermittent, constant)      cramping 6. ABDOMEN PAIN SEVERITY: If present, ask: How bad is the pain?  (e.g., Scale 1-10; mild, moderate, or severe)     Not disclosed  7. ORAL INTAKE: If vomiting, Have you been able to drink liquids? How much liquids have you had in the past 24 hours?     drinking 8. HYDRATION: Any signs of dehydration? (e.g., dry mouth [not just dry lips], too weak to stand, dizziness, new weight loss) When did you last urinate?     Not disclosed 9. EXPOSURE: Have you traveled to a foreign country recently? Have you been exposed to anyone with diarrhea? Could you have eaten any food that was spoiled?     Not disclosed 10. ANTIBIOTIC USE: Are you taking antibiotics now or have you taken antibiotics in the past 2 months?  unsure 11. OTHER SYMPTOMS: Do you have any other symptoms? (e.g., fever, blood in stool)     No other symptoms reported  Protocols used: Diarrhea-A-AH

## 2024-07-21 ENCOUNTER — Emergency Department (HOSPITAL_COMMUNITY)
Admission: EM | Admit: 2024-07-21 | Discharge: 2024-07-21 | Disposition: A | Discharge status: Home / Self Care | Source: Ambulatory Visit | Attending: Emergency Medicine | Admitting: Emergency Medicine

## 2024-07-21 ENCOUNTER — Ambulatory Visit: Payer: Self-pay

## 2024-07-21 DIAGNOSIS — R197 Diarrhea, unspecified: Secondary | ICD-10-CM | POA: Insufficient documentation

## 2024-07-21 DIAGNOSIS — I10 Essential (primary) hypertension: Secondary | ICD-10-CM | POA: Insufficient documentation

## 2024-07-21 DIAGNOSIS — Z79899 Other long term (current) drug therapy: Secondary | ICD-10-CM | POA: Insufficient documentation

## 2024-07-21 DIAGNOSIS — R1030 Lower abdominal pain, unspecified: Secondary | ICD-10-CM | POA: Insufficient documentation

## 2024-07-21 LAB — COMPREHENSIVE METABOLIC PANEL WITH GFR
ALT: 9 U/L (ref 0–44)
AST: 25 U/L (ref 15–41)
Albumin: 4.7 g/dL (ref 3.5–5.0)
Alkaline Phosphatase: 50 U/L (ref 38–126)
Anion gap: 14 (ref 5–15)
BUN: 18 mg/dL (ref 8–23)
CO2: 22 mmol/L (ref 22–32)
Calcium: 10.2 mg/dL (ref 8.9–10.3)
Chloride: 100 mmol/L (ref 98–111)
Creatinine, Ser: 1.07 mg/dL — ABNORMAL HIGH (ref 0.44–1.00)
GFR, Estimated: 49 mL/min — ABNORMAL LOW (ref >60)
Glucose, Bld: 120 mg/dL — ABNORMAL HIGH (ref 70–99)
Potassium: 3.9 mmol/L (ref 3.5–5.1)
Sodium: 136 mmol/L (ref 135–145)
Total Bilirubin: 1 mg/dL (ref 0.0–1.2)
Total Protein: 7.7 g/dL (ref 6.5–8.1)

## 2024-07-21 LAB — CBC
HCT: 40.6 % (ref 36.0–46.0)
Hemoglobin: 13.1 g/dL (ref 12.0–15.0)
MCH: 27.6 pg (ref 26.0–34.0)
MCHC: 32.3 g/dL (ref 30.0–36.0)
MCV: 85.7 fL (ref 80.0–100.0)
Platelets: 319 K/uL (ref 150–400)
RBC: 4.74 MIL/uL (ref 3.87–5.11)
RDW: 14 % (ref 11.5–15.5)
WBC: 6.2 K/uL (ref 4.0–10.5)
nRBC: 0 % (ref 0.0–0.2)

## 2024-07-21 LAB — LIPASE, BLOOD: Lipase: 56 U/L — ABNORMAL HIGH (ref 11–51)

## 2024-07-21 NOTE — ED Triage Notes (Signed)
 Diarrhea roughly for 1 week. Concerned this was from too much miralax . Every time patient eats it causes diarrhea.

## 2024-07-21 NOTE — ED Provider Notes (Signed)
 [facility] AT [facility] Provider Note   CSN: [id number] Arrival date & time: 07/21/24  1320     Patient presents with: Constipation and Diarrhea   Vickie Wilson is a 89 y.o. female.   Patient is a 89 year old female with a past medical history of hypertension, seizure disorder, anxiety, and chronic constipation presenting to the emergency department with diarrhea.  Patient is here with her daughter who helps provide history.  She states that she has had constipation for several months and takes MiraLAX  intermittently.  She states that she had been taking it about 2 weeks ago however in the last week and a half for started to develop diarrhea every time she eats something.  She states that she last took MiraLAX  on Sunday and the diarrhea has continued.  The patient endorses mild associated lower abdominal pain.  She states that she is afraid to eat due to the diarrhea.  She denies any recent antibiotic use or hospitalizations no recent travel. Last treated for a UTI a few weeks ago.  Denies any known sick contacts.  Denies any blood in her stool.  The history is provided by the patient and a relative.  Constipation Associated symptoms: diarrhea   Diarrhea      Prior to Admission medications   Medication Sig Start Date End Date Taking? Authorizing Provider  acetaminophen  (TYLENOL  8 HOUR) 650 MG CR tablet Take 1 tablet (650 mg total) by mouth every 8 (eight) hours as needed for pain. 05/18/24   Plotnikov, Karlynn GAILS, MD  amLODipine  (NORVASC ) 5 MG tablet Take 1 tablet (5 mg total) by mouth daily. 02/21/24   Plotnikov, Aleksei V, MD  ARIPiprazole  (ABILIFY ) 2 MG tablet Take 1 tablet (2 mg total) by mouth 2 (two) times daily. 02/21/24   Plotnikov, Aleksei V, MD  bisacodyl  5 MG EC tablet Take 1 tablet (5 mg total) by mouth 2 (two) times daily as needed for moderate constipation. 05/16/24   Plotnikov, Aleksei V, MD  diclofenac  Sodium (VOLTAREN  ARTHRITIS PAIN) 1 % GEL  Apply 2 g topically 4 (four) times daily. Patient not taking: Reported on 06/15/2024 05/18/24   Plotnikov, Karlynn GAILS, MD  levETIRAcetam  (KEPPRA ) 750 MG tablet Take 1 tablet (750 mg total) by mouth 2 (two) times daily. 01/05/24   Whitfield Raisin, NP  LORazepam  (ATIVAN ) 0.5 MG tablet Take 1 tablet (0.5 mg total) by mouth 2 (two) times daily as needed for anxiety. Patient not taking: Reported on 06/15/2024 02/21/24   Plotnikov, Aleksei V, MD  metoprolol  succinate (TOPROL -XL) 25 MG 24 hr tablet Take 1 tablet (25 mg total) by mouth daily. 02/21/24   Plotnikov, Aleksei V, MD  Multiple Vitamins-Minerals (CENTRUM SILVER ) CHEW Chew 1 each by mouth 3 (three) times a week.    [provider]  polyethylene glycol powder (GLYCOLAX /MIRALAX ) 17 GM/SCOOP powder Dissolve 1 capful (17g) in 4-8 ounces of liquid and take by mouth 1-2 times daily until you have a BM. May use daily or every other day to continue regular bowel movements. Be sure to hydrate 05/16/24   Plotnikov, Karlynn GAILS, MD  sennosides-docusate sodium  (SENOKOT-S) 8.6-50 MG tablet Take 1 tablet by mouth daily. 06/15/24   Plotnikov, Karlynn GAILS, MD    Allergies: Patient has no known allergies.    Review of Systems  Gastrointestinal:  Positive for constipation and diarrhea.    Updated Vital Signs BP (!) 175/90 (BP Location: Left Arm)   Pulse 77   Temp 98 F (36.7 C) (  Oral)   Resp 17   SpO2 100%   Physical Exam Vitals and nursing note reviewed.  Constitutional:      General: She is not in acute distress.    Comments: Frail appearing  HENT:     Head: Normocephalic and atraumatic.     Nose: Nose normal.     Mouth/Throat:     Mouth: Mucous membranes are dry.     Pharynx: Oropharynx is clear.  Eyes:     Extraocular Movements: Extraocular movements intact.     Conjunctiva/sclera: Conjunctivae normal.  Cardiovascular:     Rate and Rhythm: Normal rate and regular rhythm.     Heart sounds: Normal heart sounds.  Pulmonary:     Effort: Pulmonary  effort is normal.     Breath sounds: Normal breath sounds.  Abdominal:     General: Abdomen is flat.     Palpations: Abdomen is soft.     Tenderness: There is abdominal tenderness (mild lower abdominal tenderness). There is no guarding or rebound.  Musculoskeletal:        General: Normal range of motion.     Cervical back: Normal range of motion.  Skin:    General: Skin is warm and dry.  Neurological:     General: No focal deficit present.     Mental Status: She is alert and oriented to person, place, and time.  Psychiatric:        Mood and Affect: Mood normal.        Behavior: Behavior normal.     (all labs ordered are listed, but only abnormal results are displayed) Labs Reviewed  LIPASE, BLOOD - Abnormal; Notable for the following components:      Result Value   Lipase 56 (*)    All other components within normal limits  COMPREHENSIVE METABOLIC PANEL WITH GFR - Abnormal; Notable for the following components:   Glucose, Bld 120 (*)    Creatinine, Ser 1.07 (*)    GFR, Estimated 49 (*)    All other components within normal limits  GASTROINTESTINAL PANEL BY PCR, STOOL (REPLACES STOOL CULTURE)  C DIFFICILE QUICK SCREEN W PCR REFLEX    CBC  URINALYSIS, ROUTINE W REFLEX MICROSCOPIC    EKG: None  Radiology: No results found.   Procedures   Medications Ordered in the ED - No data to display  Clinical Course as of 07/21/24 1639  Fri Jul 21, 2024  1615 I spoke with lab and because the stool was formed is unable to perform c diff testing. With low likelihood of c diff will recommend imodium treatment and primary care follow up. [VK]    Clinical Course User Index [VK] Kingsley, Melody Cirrincione K, DO                                 Medical Decision Making This patient presents to the ED with chief complaint(s) of diarreah with pertinent past medical history of HTN, anxiety, seizure disorder, chronic constipation which further complicates the presenting complaint. The complaint  involves an extensive differential diagnosis and also carries with it a high risk of complications and morbidity.    The differential diagnosis includes hydration, electrolyte abnormality, gastritis, medication side effect, infectious diarrhea  Additional history obtained: Additional history obtained from family Records reviewed Primary Care Documents  ED Course and Reassessment: On patient's arrival she is hemodynamically stable in no acute distress.  Labs initiated in triage that showed  no signs of severe dehydration or significant electrolyte derangement.  Patient will stool studies performed here.  She will be closely reassessed.  Independent labs interpretation:  The following labs were independently interpreted: within normal range  Independent visualization of imaging: -N/A  Consultation: - Consulted or discussed management/test interpretation w/ external professional: N/A  Consideration for admission or further workup: Patient has no emergent conditions requiring admission or further work-up at this time and is stable for discharge home with primary care follow-up  Social Determinants of health: N/A    Amount and/or Complexity of Data Reviewed Labs: ordered.       Final diagnoses:  Diarrhea, unspecified type    ED Discharge Orders     None          Kingsley, Yaser Harvill K, DO 07/21/24 1639

## 2024-07-21 NOTE — Telephone Encounter (Signed)
 FYI Only or Action Required?: FYI only for provider: ED advised.  Patient was last seen in primary care on 06/15/2024 by Plotnikov, Karlynn GAILS, MD.  Called Nurse Triage reporting Diarrhea.  Symptoms began several weeks ago.  Interventions attempted: Rest, hydration, or home remedies.  Symptoms are: rapidly worsening.  Triage Disposition: Go to ED Now (or PCP Triage)  Patient/caregiver understands and will follow disposition?: Yes   Reason for Triage: Patient reports experiencing uncontrollable diarrhea. She states that Dr. Tito was supposed to send in a prescription for medication, but the pharmacy has not received anything yet. The patient can be contacted at [phone]  Reason for Disposition  [1] Drinking very little AND [2] dehydration suspected (e.g., no urine > 12 hours, very dry mouth, very lightheaded)  Answer Assessment - Initial Assessment Questions Patient reports severe diarrhea for the last couple of weeks-states diarrhea has been going on for six months but worsened in the last couple of weeks. Patient reports she is having to change her underwear and clothes several times a day. Endorses diarrhea after only a few bites of food. Patient states she does feel like she has lost weight over the last couple of months. Endorses some abdominal pain at times. Patient is concerned for dehydration-endorses dry mouth and weakness. Recommended to the ED for evaluation. Patient verbalized understanding and will call her daughter.   1. DIARRHEA SEVERITY: How bad is the diarrhea? How many more stools have you had in the past 24 hours than normal?      Several times a day-has been more severe the last couple of weeks.  2. ONSET: When did the diarrhea begin?      Has been going on for several months 3. STOOL DESCRIPTION:  How loose or watery is the diarrhea? What is the stool color? Is there any blood or mucous in the stool?     Loose and watery 4. VOMITING: Are you also  vomiting? If Yes, ask: How many times in the past 24 hours?      no 5. ABDOMEN PAIN: Are you having any abdomen pain? If Yes, ask: What does it feel like? (e.g., crampy, dull, intermittent, constant)      Yes at times 6. ABDOMEN PAIN SEVERITY: If present, ask: How bad is the pain?  (e.g., Scale 1-10; mild, moderate, or severe)     moderate 7. ORAL INTAKE: If vomiting, Have you been able to drink liquids? How much liquids have you had in the past 24 hours?     Drinking fluids well. 8. HYDRATION: Any signs of dehydration? (e.g., dry mouth [not just dry lips], too weak to stand, dizziness, new weight loss) When did you last urinate?     Dry mouth, weakness, weight loss 9. EXPOSURE: Have you traveled to a foreign country recently? Have you been exposed to anyone with diarrhea? Could you have eaten any food that was spoiled?     no 10. ANTIBIOTIC USE: Are you taking antibiotics now or have you taken antibiotics in the past 2 months?       no 11. OTHER SYMPTOMS: Do you have any other symptoms? (e.g., fever, blood in stool)       no  Protocols used: Diarrhea-A-AH

## 2024-07-21 NOTE — Discharge Instructions (Signed)
 You were seen in the emergency department for your diarrhea.  You had no signs of severe dehydration.  Your stool is not consistent with C. difficile.  Continue to hold your MiraLAX  and other medications for constipation such as Senokot and bisacodyl .  You can take over-the-counter Imodium as needed for diarrhea and continue to drink plenty of fluids.  You should follow-up with your primary doctor on Monday as scheduled.  You should return to the emergency department if your continuing to have significant diarrhea despite the Imodium, you are becoming more dehydrated, you become lightheaded or pass out or if you have any other new or concerning symptoms.

## 2024-07-22 LAB — GASTROINTESTINAL PANEL BY PCR, STOOL (REPLACES STOOL CULTURE)

## 2024-07-23 NOTE — Telephone Encounter (Signed)
 Schedule visit with any provider if not well.  Thank you

## 2024-07-24 ENCOUNTER — Ambulatory Visit: Admitting: Internal Medicine

## 2024-07-24 NOTE — Telephone Encounter (Signed)
 Pt was seen in the ED as advised by triage.

## 2024-07-24 NOTE — Telephone Encounter (Signed)
 Patient had acute visit scheduled with us  but instead opted for ED

## 2024-07-24 NOTE — Telephone Encounter (Signed)
 Needs office visit with any available provider.  Thank you

## 2024-07-24 NOTE — Telephone Encounter (Signed)
 Please schedule office visit with any available provider.  Thank you

## 2024-08-16 ENCOUNTER — Ambulatory Visit

## 2024-08-22 ENCOUNTER — Ambulatory Visit: Admitting: Internal Medicine

## 2024-08-30 NOTE — Progress Notes (Signed)
 Patient presented with diarrhea. GI panel was ordered to eval for bacterial diarrhea cause.

## 2024-08-31 ENCOUNTER — Other Ambulatory Visit: Payer: Self-pay | Admitting: Internal Medicine

## 2024-09-25 ENCOUNTER — Encounter: Payer: Self-pay | Admitting: Internal Medicine

## 2024-09-25 ENCOUNTER — Ambulatory Visit: Admitting: Internal Medicine

## 2024-09-25 VITALS — BP 166/100 | HR 81 | Ht 60.0 in | Wt 84.4 lb

## 2024-09-25 DIAGNOSIS — H6123 Impacted cerumen, bilateral: Secondary | ICD-10-CM

## 2024-09-25 DIAGNOSIS — F339 Major depressive disorder, recurrent, unspecified: Secondary | ICD-10-CM | POA: Diagnosis not present

## 2024-09-25 DIAGNOSIS — H00015 Hordeolum externum left lower eyelid: Secondary | ICD-10-CM | POA: Diagnosis not present

## 2024-09-25 DIAGNOSIS — F3342 Major depressive disorder, recurrent, in full remission: Secondary | ICD-10-CM

## 2024-09-25 DIAGNOSIS — F413 Other mixed anxiety disorders: Secondary | ICD-10-CM | POA: Diagnosis not present

## 2024-09-25 DIAGNOSIS — H00019 Hordeolum externum unspecified eye, unspecified eyelid: Secondary | ICD-10-CM | POA: Insufficient documentation

## 2024-09-25 MED ORDER — METOPROLOL SUCCINATE ER 25 MG PO TB24: 25.0000 mg | ORAL_TABLET | Freq: Every day | ORAL | 2 refills | Status: AC

## 2024-09-25 MED ORDER — AMLODIPINE BESYLATE 5 MG PO TABS: 5.0000 mg | ORAL_TABLET | Freq: Every day | ORAL | 2 refills | Status: AC

## 2024-09-25 MED ORDER — ERYTHROMYCIN 5 MG/GM OP OINT: 1.0000 | TOPICAL_OINTMENT | Freq: Every day | OPHTHALMIC | 1 refills | Status: AC

## 2024-09-25 MED ORDER — CEPHALEXIN 250 MG PO CAPS: 250.0000 mg | ORAL_CAPSULE | Freq: Three times a day (TID) | ORAL | 0 refills | Status: AC

## 2024-09-25 NOTE — Assessment & Plan Note (Signed)
  Cont w/prn Lorazepam  Increase Abilify  to 2 mg bid  Potential benefits of a long term benzodiazepines  use as well as potential risks  and complications were explained to the patient and were aknowledged. She declined home care RN visit.

## 2024-09-25 NOTE — Assessment & Plan Note (Signed)
 Erythro oint bid Keflex  250 tid

## 2024-09-25 NOTE — Assessment & Plan Note (Signed)
 B ears Will attempt to irrigate

## 2024-09-25 NOTE — Progress Notes (Signed)
 "  Subjective:  Patient ID: Vickie Wilson, female    DOB: September 18, 1930  Age: 89 y.o. MRN: [id number]  CC: Medical Management of Chronic Issues (3 Month follow up)   HPI Vickie Wilson presents for LBP, L eye pain, anxiety, HTN C/o hearing loss B She is here w/her dtr   Outpatient Medications Prior to Visit  Medication Sig Dispense Refill   acetaminophen  (TYLENOL  8 HOUR) 650 MG CR tablet Take 1 tablet (650 mg total) by mouth every 8 (eight) hours as needed for pain.     ARIPiprazole  (ABILIFY ) 2 MG tablet Take 1 tablet (2 mg total) by mouth 2 (two) times daily. 180 tablet 1   bisacodyl  5 MG EC tablet Take 1 tablet (5 mg total) by mouth 2 (two) times daily as needed for moderate constipation. 30 tablet 0   levETIRAcetam  (KEPPRA ) 750 MG tablet Take 1 tablet (750 mg total) by mouth 2 (two) times daily. 180 tablet 3   Multiple Vitamins-Minerals (CENTRUM SILVER ) CHEW Chew 1 each by mouth 3 (three) times a week.     polyethylene glycol powder (GLYCOLAX /MIRALAX ) 17 GM/SCOOP powder Dissolve 1 capful (17g) in 4-8 ounces of liquid and take by mouth 1-2 times daily until you have a BM. May use daily or every other day to continue regular bowel movements. Be sure to hydrate 850 g 1   sennosides-docusate sodium  (SENOKOT-S) 8.6-50 MG tablet Take 1 tablet by mouth daily. 100 tablet 3   amLODipine  (NORVASC ) 5 MG tablet Take 1 tablet (5 mg total) by mouth daily. 90 tablet 2   metoprolol  succinate (TOPROL -XL) 25 MG 24 hr tablet Take 1 tablet (25 mg total) by mouth daily. 90 tablet 2   diclofenac  Sodium (VOLTAREN  ARTHRITIS PAIN) 1 % GEL Apply 2 g topically 4 (four) times daily. (Patient not taking: Reported on 09/25/2024) 350 g 0   LORazepam  (ATIVAN ) 0.5 MG tablet Take 1 tablet (0.5 mg total) by mouth 2 (two) times daily as needed for anxiety. (Patient not taking: Reported on 09/25/2024) 60 tablet 1   No facility-administered medications prior to visit.    ROS: Review of Systems  Constitutional:  Positive for  fatigue. Negative for activity change, appetite change, chills and unexpected weight change.  HENT:  Positive for hearing loss. Negative for congestion, ear pain, mouth sores and sinus pressure.   Eyes:  Positive for pain. Negative for visual disturbance.  Respiratory:  Negative for cough, chest tightness and shortness of breath.   Cardiovascular:  Negative for leg swelling.  Gastrointestinal:  Negative for abdominal pain and nausea.  Genitourinary:  Negative for difficulty urinating, frequency and vaginal pain.  Musculoskeletal:  Positive for arthralgias. Negative for back pain and gait problem.  Skin:  Negative for pallor and rash.  Neurological:  Negative for dizziness, tremors, weakness, numbness and headaches.  Hematological:  Does not bruise/bleed easily.  Psychiatric/Behavioral:  Positive for confusion and decreased concentration. Negative for sleep disturbance. The patient is nervous/anxious.     Objective:  BP (!) 166/100   Pulse 81   Ht 5' (1.524 m)   Wt 84 lb 6.4 oz (38.3 kg)   SpO2 98%   BMI 16.48 kg/m   BP Readings from Last 3 Encounters:  09/25/24 (!) 166/100  07/21/24 (!) 175/80  06/15/24 118/78    Wt Readings from Last 3 Encounters:  09/25/24 84 lb 6.4 oz (38.3 kg)  06/15/24 84 lb 3.2 oz (38.2 kg)  06/07/24 85 lb 15.7 oz (39 kg)  Physical Exam Constitutional:      General: She is not in acute distress.    Appearance: She is well-developed. She is not ill-appearing.  HENT:     Head: Normocephalic.     Right Ear: External ear normal.     Left Ear: External ear normal.     Nose: Nose normal.  Eyes:     General:        Right eye: No discharge.        Left eye: No discharge.     Conjunctiva/sclera: Conjunctivae normal.     Pupils: Pupils are equal, round, and reactive to light.  Neck:     Thyroid : No thyromegaly.     Vascular: No JVD.     Trachea: No tracheal deviation.  Cardiovascular:     Rate and Rhythm: Normal rate and regular rhythm.     Heart  sounds: Normal heart sounds.  Pulmonary:     Effort: No respiratory distress.     Breath sounds: No stridor. No wheezing.  Abdominal:     General: Bowel sounds are normal. There is no distension.     Palpations: Abdomen is soft. There is no mass.     Tenderness: There is no abdominal tenderness. There is no guarding or rebound.  Musculoskeletal:        General: No tenderness.     Cervical back: Normal range of motion and neck supple. No rigidity.     Right lower leg: No edema.     Left lower leg: No edema.  Lymphadenopathy:     Cervical: No cervical adenopathy.  Skin:    Findings: No erythema or rash.  Neurological:     Mental Status: Mental status is at baseline. She is disoriented.     Cranial Nerves: No cranial nerve deficit.     Motor: No abnormal muscle tone.     Coordination: Coordination normal.     Deep Tendon Reflexes: Reflexes normal.  Psychiatric:        Behavior: Behavior normal.   Stye L lower eyelid 1x1 mm Moody  B wax   Procedure Note :     Procedure :  Ear irrigation right and left ears   Indication:  Cerumen impaction right and left ears   Risks, including pain, dizziness, eardrum perforation, bleeding, infection and others as well as benefits were explained to the patient in detail. Verbal consent was obtained and the patient agreed to proceed.    We used The Elephant Ear Irrigation Device filled with lukewarm water for irrigation. A large amount wax was recovered from both ears. Procedure has also required manual wax removal/instrumentation with an ear wax curette and ear forceps on the right and left ears.   Tolerated well. Complications: None.   Postprocedure instructions :  Call if problems.   Lab Results  Component Value Date   WBC 6.2 07/21/2024   HGB 13.1 07/21/2024   HCT 40.6 07/21/2024   PLT 319 07/21/2024   GLUCOSE 120 (H) 07/21/2024   CHOL 279 (H) 12/29/2021   TRIG 174.0 (H) 12/29/2021   HDL 56.50 12/29/2021   LDLDIRECT 125.6  01/16/2013   LDLCALC 188 (H) 12/29/2021   ALT 9 07/21/2024   AST 25 07/21/2024   NA 136 07/21/2024   K 3.9 07/21/2024   CL 100 07/21/2024   CREATININE 1.07 (H) 07/21/2024   BUN 18 07/21/2024   CO2 22 07/21/2024   TSH 2.32 04/26/2024   INR 0.92 04/22/2018   HGBA1C 6.1 08/22/2019  No results found.  Assessment & Plan:   Problem List Items Addressed This Visit     Anxiety disorder    Cont w/prn Lorazepam  Increase Abilify  to 2 mg bid  Potential benefits of a long term benzodiazepines  use as well as potential risks  and complications were explained to the patient and were aknowledged. She declined home care RN visit.      Depression   Worse.  Krisy is complaining of being depressed, anxious and lonely.   Cont w/prn Lorazepam  Abilify  2 mg bid  Potential benefits of a long term benzodiazepines, Abilify  use as well as potential risks  and complications were explained to the patient and were aknowledged. She declined home care RN visit.      Cerumen impaction   B ears Will attempt to irrigate      Stye - Primary   Erythro oint bid Keflex  250 tid         Meds ordered this encounter  Medications   amLODipine  (NORVASC ) 5 MG tablet    Sig: Take 1 tablet (5 mg total) by mouth daily.    Dispense:  90 tablet    Refill:  2   metoprolol  succinate (TOPROL -XL) 25 MG 24 hr tablet    Sig: Take 1 tablet (25 mg total) by mouth daily.    Dispense:  90 tablet    Refill:  2   cephALEXin  (KEFLEX ) 250 MG capsule    Sig: Take 1 capsule (250 mg total) by mouth 3 (three) times daily.    Dispense:  30 capsule    Refill:  0   erythromycin  ophthalmic ointment    Sig: Place 1 Application into both eyes at bedtime.    Dispense:  3.5 g    Refill:  1      Follow-up: Return in about 3 months (around 12/24/2024) for a follow-up visit.  Marolyn Noel, MD "

## 2024-09-25 NOTE — Assessment & Plan Note (Signed)
 Worse.  Vickie Wilson is complaining of being depressed, anxious and lonely.   Cont w/prn Lorazepam  Abilify  2 mg bid  Potential benefits of a long term benzodiazepines, Abilify  use as well as potential risks  and complications were explained to the patient and were aknowledged. She declined home care RN visit.

## 2024-09-25 NOTE — Patient Instructions (Signed)

## 2024-10-27 ENCOUNTER — Ambulatory Visit

## 2025-01-25 ENCOUNTER — Ambulatory Visit: Admitting: Adult Health
